# Patient Record
Sex: Male | Born: 1965 | Race: Black or African American | Hispanic: No | Smoking: Former smoker
Health system: Southern US, Community
[De-identification: ages and names within clinical notes are randomized; demographics above are authoritative.]

## PROBLEM LIST (undated history)

## (undated) ENCOUNTER — Emergency Department (HOSPITAL_COMMUNITY): Admission: EM | Payer: No Typology Code available for payment source | Source: Home / Self Care

## (undated) ENCOUNTER — Emergency Department: Payer: Self-pay

## (undated) DIAGNOSIS — W3400XA Accidental discharge from unspecified firearms or gun, initial encounter: Secondary | ICD-10-CM

## (undated) DIAGNOSIS — F319 Bipolar disorder, unspecified: Secondary | ICD-10-CM

## (undated) DIAGNOSIS — Z86718 Personal history of other venous thrombosis and embolism: Secondary | ICD-10-CM

## (undated) DIAGNOSIS — R45851 Suicidal ideations: Secondary | ICD-10-CM

## (undated) DIAGNOSIS — K5090 Crohn's disease, unspecified, without complications: Secondary | ICD-10-CM

## (undated) DIAGNOSIS — N289 Disorder of kidney and ureter, unspecified: Secondary | ICD-10-CM

## (undated) DIAGNOSIS — F329 Major depressive disorder, single episode, unspecified: Secondary | ICD-10-CM

## (undated) DIAGNOSIS — F32A Depression, unspecified: Secondary | ICD-10-CM

## (undated) DIAGNOSIS — R519 Headache, unspecified: Secondary | ICD-10-CM

## (undated) DIAGNOSIS — L739 Follicular disorder, unspecified: Secondary | ICD-10-CM

## (undated) DIAGNOSIS — I10 Essential (primary) hypertension: Secondary | ICD-10-CM

## (undated) DIAGNOSIS — E119 Type 2 diabetes mellitus without complications: Secondary | ICD-10-CM

## (undated) DIAGNOSIS — R51 Headache: Secondary | ICD-10-CM

## (undated) DIAGNOSIS — F419 Anxiety disorder, unspecified: Secondary | ICD-10-CM

## (undated) DIAGNOSIS — F4310 Post-traumatic stress disorder, unspecified: Secondary | ICD-10-CM

## (undated) DIAGNOSIS — S0193XA Puncture wound without foreign body of unspecified part of head, initial encounter: Secondary | ICD-10-CM

## (undated) HISTORY — PX: FOOT SURGERY: SHX648

## (undated) HISTORY — PX: KNEE SURGERY: SHX244

## (undated) HISTORY — PX: VASCULAR SURGERY: SHX849

## (undated) HISTORY — PX: FINGER SURGERY: SHX640

## (undated) HISTORY — DX: Crohn's disease, unspecified, without complications: K50.90

---

## 1999-01-07 ENCOUNTER — Emergency Department (HOSPITAL_COMMUNITY): Admission: EM | Admit: 1999-01-07 | Discharge: 1999-01-07 | Payer: Self-pay | Admitting: *Deleted

## 2004-02-22 ENCOUNTER — Emergency Department (HOSPITAL_COMMUNITY): Admission: EM | Admit: 2004-02-22 | Discharge: 2004-02-23 | Payer: Self-pay | Admitting: *Deleted

## 2005-06-11 ENCOUNTER — Emergency Department (HOSPITAL_COMMUNITY): Admission: EM | Admit: 2005-06-11 | Discharge: 2005-06-11 | Payer: Self-pay | Admitting: Emergency Medicine

## 2008-05-21 ENCOUNTER — Emergency Department (HOSPITAL_COMMUNITY): Admission: EM | Admit: 2008-05-21 | Discharge: 2008-05-21 | Payer: Self-pay | Admitting: Emergency Medicine

## 2008-07-09 ENCOUNTER — Ambulatory Visit: Payer: Self-pay | Admitting: Vascular Surgery

## 2008-07-09 ENCOUNTER — Encounter (INDEPENDENT_AMBULATORY_CARE_PROVIDER_SITE_OTHER): Payer: Self-pay | Admitting: Emergency Medicine

## 2008-07-09 ENCOUNTER — Inpatient Hospital Stay (HOSPITAL_COMMUNITY): Admission: EM | Admit: 2008-07-09 | Discharge: 2008-07-12 | Payer: Self-pay | Admitting: Emergency Medicine

## 2008-07-15 ENCOUNTER — Emergency Department (HOSPITAL_COMMUNITY): Admission: EM | Admit: 2008-07-15 | Discharge: 2008-07-15 | Payer: Self-pay | Admitting: Emergency Medicine

## 2008-07-19 ENCOUNTER — Emergency Department (HOSPITAL_COMMUNITY): Admission: EM | Admit: 2008-07-19 | Discharge: 2008-07-19 | Payer: Self-pay | Admitting: Emergency Medicine

## 2008-08-09 ENCOUNTER — Emergency Department (HOSPITAL_COMMUNITY): Admission: EM | Admit: 2008-08-09 | Discharge: 2008-08-09 | Payer: Self-pay | Admitting: Family Medicine

## 2008-08-09 ENCOUNTER — Emergency Department (HOSPITAL_COMMUNITY): Admission: EM | Admit: 2008-08-09 | Discharge: 2008-08-09 | Payer: Self-pay | Admitting: Emergency Medicine

## 2008-08-14 ENCOUNTER — Emergency Department (HOSPITAL_COMMUNITY): Admission: EM | Admit: 2008-08-14 | Discharge: 2008-08-14 | Payer: Self-pay | Admitting: Emergency Medicine

## 2008-08-14 ENCOUNTER — Ambulatory Visit: Payer: Self-pay | Admitting: Internal Medicine

## 2008-08-21 ENCOUNTER — Ambulatory Visit: Payer: Self-pay | Admitting: *Deleted

## 2008-08-21 ENCOUNTER — Ambulatory Visit: Payer: Self-pay | Admitting: Internal Medicine

## 2008-08-28 ENCOUNTER — Ambulatory Visit: Payer: Self-pay | Admitting: Internal Medicine

## 2008-09-11 ENCOUNTER — Ambulatory Visit: Payer: Self-pay | Admitting: Internal Medicine

## 2008-09-17 ENCOUNTER — Ambulatory Visit (HOSPITAL_COMMUNITY): Admission: RE | Admit: 2008-09-17 | Discharge: 2008-09-17 | Payer: Self-pay | Admitting: Internal Medicine

## 2008-10-08 ENCOUNTER — Ambulatory Visit: Payer: Self-pay | Admitting: Internal Medicine

## 2008-10-08 DIAGNOSIS — G609 Hereditary and idiopathic neuropathy, unspecified: Secondary | ICD-10-CM | POA: Insufficient documentation

## 2008-10-08 DIAGNOSIS — I2699 Other pulmonary embolism without acute cor pulmonale: Secondary | ICD-10-CM | POA: Insufficient documentation

## 2008-10-08 DIAGNOSIS — Z9189 Other specified personal risk factors, not elsewhere classified: Secondary | ICD-10-CM | POA: Insufficient documentation

## 2008-10-08 DIAGNOSIS — F329 Major depressive disorder, single episode, unspecified: Secondary | ICD-10-CM

## 2008-10-08 DIAGNOSIS — K219 Gastro-esophageal reflux disease without esophagitis: Secondary | ICD-10-CM

## 2008-10-08 DIAGNOSIS — F411 Generalized anxiety disorder: Secondary | ICD-10-CM | POA: Insufficient documentation

## 2008-10-08 DIAGNOSIS — K279 Peptic ulcer, site unspecified, unspecified as acute or chronic, without hemorrhage or perforation: Secondary | ICD-10-CM | POA: Insufficient documentation

## 2008-10-08 DIAGNOSIS — L509 Urticaria, unspecified: Secondary | ICD-10-CM

## 2008-10-08 DIAGNOSIS — I82409 Acute embolism and thrombosis of unspecified deep veins of unspecified lower extremity: Secondary | ICD-10-CM | POA: Insufficient documentation

## 2008-10-08 LAB — CONVERTED CEMR LAB
INR: 1.3 — ABNORMAL HIGH (ref 0.8–1.0)
Prothrombin Time: 14.2 s — ABNORMAL HIGH (ref 10.9–13.3)

## 2008-10-11 ENCOUNTER — Ambulatory Visit: Payer: Self-pay | Admitting: *Deleted

## 2008-11-11 ENCOUNTER — Emergency Department (HOSPITAL_COMMUNITY): Admission: EM | Admit: 2008-11-11 | Discharge: 2008-11-11 | Payer: Self-pay | Admitting: Emergency Medicine

## 2008-11-11 ENCOUNTER — Encounter (INDEPENDENT_AMBULATORY_CARE_PROVIDER_SITE_OTHER): Payer: Self-pay | Admitting: Emergency Medicine

## 2009-01-06 ENCOUNTER — Ambulatory Visit: Payer: Self-pay | Admitting: Internal Medicine

## 2009-01-31 ENCOUNTER — Emergency Department (HOSPITAL_COMMUNITY): Admission: EM | Admit: 2009-01-31 | Discharge: 2009-01-31 | Payer: Self-pay | Admitting: Emergency Medicine

## 2009-02-06 ENCOUNTER — Encounter (INDEPENDENT_AMBULATORY_CARE_PROVIDER_SITE_OTHER): Payer: Self-pay | Admitting: *Deleted

## 2009-02-06 ENCOUNTER — Inpatient Hospital Stay (HOSPITAL_COMMUNITY): Admission: EM | Admit: 2009-02-06 | Discharge: 2009-02-07 | Payer: Self-pay | Admitting: Emergency Medicine

## 2009-02-07 ENCOUNTER — Encounter: Payer: Self-pay | Admitting: Internal Medicine

## 2009-02-14 ENCOUNTER — Telehealth (INDEPENDENT_AMBULATORY_CARE_PROVIDER_SITE_OTHER): Payer: Self-pay | Admitting: *Deleted

## 2009-02-14 ENCOUNTER — Ambulatory Visit: Payer: Self-pay | Admitting: Internal Medicine

## 2009-02-14 DIAGNOSIS — N508 Other specified disorders of male genital organs: Secondary | ICD-10-CM | POA: Insufficient documentation

## 2009-02-14 DIAGNOSIS — K6289 Other specified diseases of anus and rectum: Secondary | ICD-10-CM

## 2009-02-14 LAB — CONVERTED CEMR LAB
INR: 1.8 — ABNORMAL HIGH (ref 0.8–1.0)
Prothrombin Time: 19.1 s — ABNORMAL HIGH (ref 9.1–11.7)

## 2009-02-19 ENCOUNTER — Ambulatory Visit: Payer: Self-pay | Admitting: Cardiology

## 2009-02-19 ENCOUNTER — Telehealth: Payer: Self-pay | Admitting: Internal Medicine

## 2009-02-19 LAB — CONVERTED CEMR LAB: POC INR: 1.7

## 2009-02-21 ENCOUNTER — Telehealth: Payer: Self-pay | Admitting: Internal Medicine

## 2009-02-21 ENCOUNTER — Encounter (INDEPENDENT_AMBULATORY_CARE_PROVIDER_SITE_OTHER): Payer: Self-pay | Admitting: *Deleted

## 2009-02-22 ENCOUNTER — Encounter: Payer: Self-pay | Admitting: Internal Medicine

## 2009-03-18 ENCOUNTER — Ambulatory Visit: Payer: Self-pay | Admitting: Gastroenterology

## 2009-03-19 ENCOUNTER — Encounter (INDEPENDENT_AMBULATORY_CARE_PROVIDER_SITE_OTHER): Payer: Self-pay | Admitting: Cardiology

## 2009-03-19 ENCOUNTER — Ambulatory Visit: Payer: Self-pay | Admitting: Cardiology

## 2009-03-19 LAB — CONVERTED CEMR LAB: POC INR: 2.1

## 2009-05-28 ENCOUNTER — Encounter (INDEPENDENT_AMBULATORY_CARE_PROVIDER_SITE_OTHER): Payer: Self-pay | Admitting: Cardiology

## 2009-07-29 ENCOUNTER — Encounter (INDEPENDENT_AMBULATORY_CARE_PROVIDER_SITE_OTHER): Payer: Self-pay | Admitting: Pharmacist

## 2009-08-21 ENCOUNTER — Encounter (INDEPENDENT_AMBULATORY_CARE_PROVIDER_SITE_OTHER): Payer: Self-pay | Admitting: Pharmacist

## 2009-09-24 ENCOUNTER — Telehealth (INDEPENDENT_AMBULATORY_CARE_PROVIDER_SITE_OTHER): Payer: Self-pay | Admitting: *Deleted

## 2009-09-25 ENCOUNTER — Telehealth: Payer: Self-pay | Admitting: Internal Medicine

## 2009-11-11 ENCOUNTER — Telehealth: Payer: Self-pay | Admitting: Internal Medicine

## 2009-11-12 ENCOUNTER — Encounter: Payer: Self-pay | Admitting: Internal Medicine

## 2009-11-20 ENCOUNTER — Encounter: Payer: Self-pay | Admitting: Cardiovascular Disease

## 2010-05-07 NOTE — Progress Notes (Signed)
  Phone Note Outgoing Call   Call placed by: Bethena Midget, RN, BSN,  September 24, 2009 4:45 PM Details for Reason: Missed CVRR appt.  Summary of Call: [facility] for pt to call and inform us of his coumadin status, where he is still on med or not. Flag also sent to his PCP.  Initial call taken by: Bethena Midget, RN, BSN,  September 24, 2009 4:46 PM

## 2010-05-07 NOTE — Progress Notes (Signed)
Summary: ?pt lost to followup  ---- Converted from flag ---- ---- 09/24/2009 4:45 PM, Bethena Midget, RN, BSN wrote: Lorain Childes: Hello, This is a very deliquent patient. We have not seen him since Dec 2010. We have attempted several times via phone calls and letters. Please advise. Thank you. ------------------------------  i have also not seen him since 02/2009 - i have not heard anything about his status - i will ask our office staff to also try contacting him to see if he is recieving care elsewhere. if not, will request f/u here  - thanks  Newt Lukes MD  September 25, 2009 12:17 PM        Additional Follow-up for Phone Call Additional follow up Details #2::    Called pt no ansew [facility] RTC ASAP Follow-up by: Orlan Leavens,  September 25, 2009 2:11 PM  Additional Follow-up for Phone Call Additional follow up Details #3:: Details for Additional Follow-up Action Taken: Attempt to call pt again not sucessful. No other contact person is his chart. Did leave another msg for him to RTC. Additional Follow-up by: Orlan Leavens,  September 26, 2009 1:23 PM  noted - thanks Newt Lukes MD  September 26, 2009 1:43 PM

## 2010-05-07 NOTE — Progress Notes (Signed)
Summary: pt lost to f/u - in jail - dismissal  Phone Note Outgoing Call   Call placed by: Bethena Midget, RN, BSN,  November 11, 2009 3:51 PM Call placed to: patient father Details for Reason: Past due CVRR appt Summary of Call: Spoke with pt father. He states that the pt has been incarcerated. Father not sure of length of time but at least a year. Unsure if pt is still on coumadin, unable to contact pt. Please advise how you wish to handle.  Initial call taken by: Bethena Midget, RN, BSN,  November 11, 2009 3:51 PM  Follow-up for Phone Call        as we are unable to safely provide care due to lack of reliable followup, and since pt is under care of alternate health service providers, i will generate a practice dismissal letter - i do not feel any other action is needed at this time -  lucy - can you please help me with these forms in lou's abscence? thanks Follow-up by: Newt Lukes MD,  November 11, 2009 5:03 PM  Additional Follow-up for Phone Call Additional follow up Details #1::        fill-out dismissal form, but also md will have to do discharge letter to send down with form Additional Follow-up by: Orlan Leavens RMA,  November 12, 2009 8:32 AM    Additional Follow-up for Phone Call Additional follow up Details #2::    form and letter done - given to lucy to give to [facility] upon return Newt Lukes MD  November 12, 2009 1:14 PM   Additional Follow-up for Phone Call Additional follow up Details #3:: Details for Additional Follow-up Action Taken: Gave letter to [facility] for dismissal Additional Follow-up by: Orlan Leavens RMA,  November 17, 2009 9:07 AM

## 2010-05-07 NOTE — Letter (Signed)
Summary: Custom - Delinquent Coumadin 1  Coumadin  [address]   [place], [place] [address]   Phone: [phone]  Fax: [phone]     Aug 21, 2009 MRN: [id number]   [facility] [address] [place], [place]  [address]   Dear Dale [name],  This letter is being sent to you as a reminder that it is necessary for you to get your INR/PT checked regularly so that we can optimize your care.  Our records indicate that you were scheduled to have a test done recently.  As of today, we have not received the results of this test.  It is very important that you have your INR checked.  Please call our office at the number listed above to schedule an appointment at your earliest convenience.    If you have recently had your protime checked or have discontinued this medication, please contact our office at the above phone number to clarify this issue.  Thank you for this prompt attention to this important health care matter.  Sincerely,   [facility] Team

## 2010-05-07 NOTE — Letter (Signed)
Summary: Custom - Delinquent Coumadin 1  Coumadin  [address]   [place], [place] [address]   Phone: [phone]  Fax: [phone]     May 28, 2009 MRN: [id number]   [facility] [address] [place], [place]  [address]   Dear Dale [name],  This letter is being sent to you as a reminder that it is necessary for you to get your INR/PT checked regularly so that we can optimize your care.  Our records indicate that you were scheduled to have a test done recently.  As of today, we have not received the results of this test.  It is very important that you have your INR checked.  Please call our office at the number listed above to schedule an appointment at your earliest convenience.    If you have recently had your protime checked or have discontinued this medication, please contact our office at the above phone number to clarify this issue.  Thank you for this prompt attention to this important health care matter.  Sincerely,   [facility] Team

## 2010-05-07 NOTE — Medication Information (Signed)
Summary: Coumadin Clinic  Anticoagulant Therapy  Managed by: Inactive Referring MD: Felicity Coyer PCP: Newt Lukes MD Supervising MD: Excell Seltzer MD, Casimiro Needle Indication 1: Pulmonary Embolism Lab Used: [facility] [place] Site: [address] INR RANGE 2-3          Comments: Pt dismissed from pratice.. incarcerated  Allergies: 1)  ! Ibuprofen  Anticoagulation Management History:      Negative risk factors for bleeding include an age less than 56 years old.  The bleeding index is 'low risk'.  Negative CHADS2 values include Age > 42 years old.  His last INR was 1.8 ratio.  Anticoagulation responsible provider: Excell Seltzer MD, Casimiro Needle.  Exp: 05/2010.    Anticoagulation Management Assessment/Plan:      The patient's current anticoagulation dose is Warfarin sodium 4 mg tabs: take as directed.  The next INR is due 04/09/2009.  Anticoagulation instructions were given to patient and wife.  Results were reviewed/authorized by Inactive.         Prior Anticoagulation Instructions: INR 2.1  Continue taking 2 tabs daily.  Recheck in 2 - 3 weeks.

## 2010-05-07 NOTE — Letter (Signed)
Summary: Custom - Delinquent Coumadin 2  Coumadin  [address]   [place], [place] [address]   Phone: [phone]  Fax: [phone]     July 29, 2009 MRN: [id number]   [facility] [address] [place], [place]  [address]   Dear Mr. Molesky,  We have attempted to contact you by phone and letter on multiple occasions to contact our office for important blood work associated with the blood thinner, warfarin (Coumadin).  Warfarin is a very important drug that can cause life threatening side effects including, bleeding, and thus requires close laboratory monitoring.  We are unable to accept responsibility for blood thinner-related health problems you may develop because you have not followed our recommendations for appropriate monitoring.  These may include abnormal bleeding occurrences and/or development of blood clots (stroke, heart attack, blood clots in legs or lungs, etc.).  We need for you to contact this office at the number listed above to schedule and complete this very important blood work.  Thank you for your assistance in this urgent matter.  Sincerely,  [facility] Team  Appended Document: Custom - Delinquent Coumadin 2 Attempted to call pt to reschedule missed CVRR appt.  LMOM to call back.  Delinquent letter sent.  EWJ

## 2010-05-07 NOTE — Letter (Signed)
Summary: Discharge Letter  [facility]  [address] [place], [place] [address]   Phone: [phone]  Fax: [phone]       11/12/2009 MRN: [id number]  [facility] [address] [place], [place]  [address]  Dear Dale [name],   I find it necessary to inform you that I will not be able to provide medical care to you, because of lack of reliable followup with our clinic and medical providers.   Since your condition requires medical attention, I advise you continue to receive care from your current health providers (ongoing at this time) and then place your self under the care of another physician without delay when you are released from ongoing care at your current facility. If you desire, I will be available for emergency care for 30 days after you receive this letter.  This should give you ample time to select a physician of your choice from the many competent providers in this area. You may want to call the local medical society or [name]'s physician referral service [phone]) for their assistance in locating a new physician. With your written authorization, I will make a copy of your medical record available to your new physician.   Sincerely,    Rene Paci MD  Appended Document: Discharge Letter Received signed receipt verifying delivery of certified letter.

## 2010-07-08 LAB — APTT: aPTT: 36 seconds (ref 24–37)

## 2010-07-08 LAB — COMPREHENSIVE METABOLIC PANEL
ALT: 27 U/L (ref 0–53)
ALT: 41 U/L (ref 0–53)
AST: 16 U/L (ref 0–37)
Alkaline Phosphatase: 84 U/L (ref 39–117)
BUN: 13 mg/dL (ref 6–23)
CO2: 25 mEq/L (ref 19–32)
Calcium: 8 mg/dL — ABNORMAL LOW (ref 8.4–10.5)
GFR calc Af Amer: >60 mL/min (ref >60)
GFR calc non Af Amer: >60 mL/min (ref >60)
Glucose, Bld: 88 mg/dL (ref 70–99)
Potassium: 3.2 mEq/L — ABNORMAL LOW (ref 3.5–5.1)
Sodium: 137 mEq/L (ref 135–145)
Sodium: 140 mEq/L (ref 135–145)
Total Bilirubin: 0.4 mg/dL (ref 0.3–1.2)
Total Protein: 5.2 g/dL — ABNORMAL LOW (ref 6.0–8.3)
Total Protein: 6.7 g/dL (ref 6.0–8.3)

## 2010-07-08 LAB — CBC
HCT: 39.6 % (ref 39.0–52.0)
HCT: 44.1 % (ref 39.0–52.0)
Hemoglobin: 13.5 g/dL (ref 13.0–17.0)
Hemoglobin: 14.9 g/dL (ref 13.0–17.0)
MCHC: 34 g/dL (ref 30.0–36.0)
MCHC: 34.2 g/dL (ref 30.0–36.0)
RBC: 4.35 MIL/uL (ref 4.22–5.81)
RBC: 4.39 MIL/uL (ref 4.22–5.81)
RBC: 4.88 MIL/uL (ref 4.22–5.81)
RDW: 12.6 % (ref 11.5–15.5)
RDW: 13.2 % (ref 11.5–15.5)

## 2010-07-08 LAB — URINALYSIS, ROUTINE W REFLEX MICROSCOPIC
Bilirubin Urine: NEGATIVE
Ketones, ur: NEGATIVE mg/dL
Nitrite: NEGATIVE
Protein, ur: NEGATIVE mg/dL
Urobilinogen, UA: 1 mg/dL (ref 0.0–1.0)

## 2010-07-08 LAB — DIFFERENTIAL
Basophils Absolute: 0 10*3/uL (ref 0.0–0.1)
Basophils Relative: 1 % (ref 0–1)
Eosinophils Absolute: 0.2 10*3/uL (ref 0.0–0.7)
Neutro Abs: 2.3 10*3/uL (ref 1.7–7.7)
Neutrophils Relative %: 53 % (ref 43–77)

## 2010-07-08 LAB — GLUCOSE, CAPILLARY
Glucose-Capillary: 72 mg/dL (ref 70–99)
Glucose-Capillary: 78 mg/dL (ref 70–99)
Glucose-Capillary: 91 mg/dL (ref 70–99)
Glucose-Capillary: 97 mg/dL (ref 70–99)

## 2010-07-08 LAB — URINE MICROSCOPIC-ADD ON

## 2010-07-08 LAB — CULTURE, BLOOD (ROUTINE X 2): Culture: NO GROWTH

## 2010-07-08 LAB — LIPASE, BLOOD: Lipase: 17 U/L (ref 11–59)

## 2010-07-08 LAB — PROTIME-INR
INR: 2.11 — ABNORMAL HIGH (ref 0.00–1.49)
Prothrombin Time: 23.5 seconds — ABNORMAL HIGH (ref 11.6–15.2)

## 2010-07-08 LAB — URINE CULTURE
Colony Count: NO GROWTH
Culture: NO GROWTH

## 2010-07-09 LAB — POCT I-STAT, CHEM 8
Hemoglobin: 15.3 g/dL (ref 13.0–17.0)
Sodium: 140 mEq/L (ref 135–145)
TCO2: 24 mmol/L (ref 0–100)

## 2010-07-09 LAB — PROTIME-INR
INR: 1.42 (ref 0.00–1.49)
Prothrombin Time: 17.2 seconds — ABNORMAL HIGH (ref 11.6–15.2)

## 2010-07-09 LAB — APTT: aPTT: 30 seconds (ref 24–37)

## 2010-07-11 LAB — POCT I-STAT, CHEM 8
BUN: 17 mg/dL (ref 6–23)
Calcium, Ion: 1.12 mmol/L (ref 1.12–1.32)
Calcium, Ion: 1.21 mmol/L (ref 1.12–1.32)
Chloride: 109 mEq/L (ref 96–112)
HCT: 46 % (ref 39.0–52.0)
HCT: 47 % (ref 39.0–52.0)
Potassium: 4 mEq/L (ref 3.5–5.1)
TCO2: 22 mmol/L (ref 0–100)

## 2010-07-11 LAB — DIFFERENTIAL
Basophils Relative: 1 % (ref 0–1)
Monocytes Relative: 8 % (ref 3–12)
Neutro Abs: 3.4 10*3/uL (ref 1.7–7.7)
Neutrophils Relative %: 59 % (ref 43–77)

## 2010-07-11 LAB — CBC
MCHC: 33.4 g/dL (ref 30.0–36.0)
RBC: 4.9 MIL/uL (ref 4.22–5.81)
WBC: 5.6 10*3/uL (ref 4.0–10.5)

## 2010-07-11 LAB — POCT CARDIAC MARKERS: Troponin i, poc: <0.05 ng/mL (ref 0.00–0.09)

## 2010-07-11 LAB — PROTIME-INR: INR: 1 (ref 0.00–1.49)

## 2010-07-13 LAB — APTT: aPTT: 34 seconds (ref 24–37)

## 2010-07-13 LAB — PROTIME-INR
INR: 2.1 — ABNORMAL HIGH (ref 0.00–1.49)
Prothrombin Time: 25 seconds — ABNORMAL HIGH (ref 11.6–15.2)

## 2010-07-13 LAB — BASIC METABOLIC PANEL
CO2: 24 mEq/L (ref 19–32)
Calcium: 9.1 mg/dL (ref 8.4–10.5)
Creatinine, Ser: 1.38 mg/dL (ref 0.4–1.5)
GFR calc Af Amer: >60 mL/min (ref >60)
Potassium: 3.4 mEq/L — ABNORMAL LOW (ref 3.5–5.1)
Sodium: 139 mEq/L (ref 135–145)

## 2010-07-13 LAB — CBC
Hemoglobin: 15.2 g/dL (ref 13.0–17.0)
RBC: 4.79 MIL/uL (ref 4.22–5.81)
WBC: 7.8 10*3/uL (ref 4.0–10.5)

## 2010-07-14 LAB — POCT CARDIAC MARKERS
Myoglobin, poc: 115 ng/mL (ref 12–200)
Troponin i, poc: <0.05 ng/mL (ref 0.00–0.09)

## 2010-07-14 LAB — CBC
HCT: 44.2 % (ref 39.0–52.0)
Hemoglobin: 15.1 g/dL (ref 13.0–17.0)
MCV: 92.9 fL (ref 78.0–100.0)
Platelets: 155 10*3/uL (ref 150–400)
RBC: 4.81 MIL/uL (ref 4.22–5.81)
RDW: 15.1 % (ref 11.5–15.5)
RDW: 15.2 % (ref 11.5–15.5)
WBC: 7.4 10*3/uL (ref 4.0–10.5)

## 2010-07-14 LAB — COMPREHENSIVE METABOLIC PANEL
ALT: 47 U/L (ref 0–53)
Alkaline Phosphatase: 81 U/L (ref 39–117)
Chloride: 109 mEq/L (ref 96–112)
Glucose, Bld: 116 mg/dL — ABNORMAL HIGH (ref 70–99)
Potassium: 4.2 mEq/L (ref 3.5–5.1)
Sodium: 141 mEq/L (ref 135–145)
Total Bilirubin: 0.5 mg/dL (ref 0.3–1.2)
Total Protein: 6.5 g/dL (ref 6.0–8.3)

## 2010-07-14 LAB — BASIC METABOLIC PANEL
BUN: 8 mg/dL (ref 6–23)
Chloride: 106 mEq/L (ref 96–112)
Glucose, Bld: 102 mg/dL — ABNORMAL HIGH (ref 70–99)
Potassium: 4.7 mEq/L (ref 3.5–5.1)

## 2010-07-14 LAB — DIFFERENTIAL
Basophils Absolute: 0 10*3/uL (ref 0.0–0.1)
Basophils Absolute: 0 10*3/uL (ref 0.0–0.1)
Basophils Relative: 0 % (ref 0–1)
Basophils Relative: 0 % (ref 0–1)
Eosinophils Absolute: 0.4 10*3/uL (ref 0.0–0.7)
Eosinophils Absolute: 0.4 10*3/uL (ref 0.0–0.7)
Eosinophils Relative: 6 % — ABNORMAL HIGH (ref 0–5)
Monocytes Absolute: 0.4 10*3/uL (ref 0.1–1.0)
Monocytes Relative: 5 % (ref 3–12)
Neutrophils Relative %: 67 % (ref 43–77)

## 2010-07-14 LAB — POCT URINALYSIS DIP (DEVICE)
Bilirubin Urine: NEGATIVE
Glucose, UA: NEGATIVE mg/dL
Ketones, ur: NEGATIVE mg/dL
Nitrite: NEGATIVE

## 2010-07-14 LAB — CK TOTAL AND CKMB (NOT AT ARMC): Total CK: 241 U/L — ABNORMAL HIGH (ref 7–232)

## 2010-07-14 LAB — PROTIME-INR
Prothrombin Time: 23.1 seconds — ABNORMAL HIGH (ref 11.6–15.2)
Prothrombin Time: 29.1 seconds — ABNORMAL HIGH (ref 11.6–15.2)

## 2010-07-14 LAB — TROPONIN I: Troponin I: 0.02 ng/mL (ref 0.00–0.06)

## 2010-07-15 LAB — PROTHROMBIN GENE MUTATION

## 2010-07-15 LAB — PROTIME-INR
INR: 1.1 (ref 0.00–1.49)
INR: 1 (ref 0.00–1.49)
INR: 1.1 (ref 0.00–1.49)
INR: 1.6 — ABNORMAL HIGH (ref 0.00–1.49)
Prothrombin Time: 14.6 seconds (ref 11.6–15.2)
Prothrombin Time: 13.1 seconds (ref 11.6–15.2)
Prothrombin Time: 13.7 seconds (ref 11.6–15.2)

## 2010-07-15 LAB — COMPREHENSIVE METABOLIC PANEL
AST: 80 U/L — ABNORMAL HIGH (ref 0–37)
CO2: 28 mEq/L (ref 19–32)
Calcium: 9.1 mg/dL (ref 8.4–10.5)
Creatinine, Ser: 1.09 mg/dL (ref 0.4–1.5)
GFR calc Af Amer: >60 mL/min (ref >60)
GFR calc non Af Amer: >60 mL/min (ref >60)
Total Protein: 6.4 g/dL (ref 6.0–8.3)

## 2010-07-15 LAB — CBC
HCT: 42.3 % (ref 39.0–52.0)
Hemoglobin: 14.5 g/dL (ref 13.0–17.0)
MCHC: 33.8 g/dL (ref 30.0–36.0)
MCHC: 34.1 g/dL (ref 30.0–36.0)
MCV: 91.8 fL (ref 78.0–100.0)
MCV: 91.9 fL (ref 78.0–100.0)
MCV: 92.9 fL (ref 78.0–100.0)
Platelets: 220 10*3/uL (ref 150–400)
Platelets: 222 10*3/uL (ref 150–400)
RBC: 4.84 MIL/uL (ref 4.22–5.81)
RDW: 13.3 % (ref 11.5–15.5)
WBC: 8.9 10*3/uL (ref 4.0–10.5)
WBC: 9.4 10*3/uL (ref 4.0–10.5)

## 2010-07-15 LAB — LUPUS ANTICOAGULANT PANEL: PTT Lupus Anticoagulant: 42.5 secs (ref 36.3–48.8)

## 2010-07-15 LAB — DIFFERENTIAL
Basophils Absolute: 0 10*3/uL (ref 0.0–0.1)
Basophils Relative: 0 % (ref 0–1)
Basophils Relative: 1 % (ref 0–1)
Eosinophils Absolute: 0.2 10*3/uL (ref 0.0–0.7)
Eosinophils Absolute: 0.3 10*3/uL (ref 0.0–0.7)
Eosinophils Relative: 4 % (ref 0–5)
Lymphocytes Relative: 22 % (ref 12–46)
Lymphs Abs: 1.4 10*3/uL (ref 0.7–4.0)
Monocytes Absolute: 0.5 10*3/uL (ref 0.1–1.0)
Monocytes Absolute: 0.6 10*3/uL (ref 0.1–1.0)
Monocytes Relative: 8 % (ref 3–12)
Neutro Abs: 6.5 10*3/uL (ref 1.7–7.7)
Neutrophils Relative %: 67 % (ref 43–77)
Neutrophils Relative %: 74 % (ref 43–77)

## 2010-07-15 LAB — URINE MICROSCOPIC-ADD ON

## 2010-07-15 LAB — URINALYSIS, ROUTINE W REFLEX MICROSCOPIC
Nitrite: NEGATIVE
Protein, ur: 30 mg/dL — AB
Specific Gravity, Urine: 1.025 (ref 1.005–1.030)
Urobilinogen, UA: 0.2 mg/dL (ref 0.0–1.0)

## 2010-07-15 LAB — LIPASE, BLOOD: Lipase: 16 U/L (ref 11–59)

## 2010-07-15 LAB — HOMOCYSTEINE: Homocysteine: 10.1 umol/L (ref 4.0–15.4)

## 2010-07-15 LAB — BETA-2-GLYCOPROTEIN I ABS, IGG/M/A: Beta-2-Glycoprotein I IgA: <4 U/mL (ref <10)

## 2010-07-15 LAB — POCT I-STAT, CHEM 8
Hemoglobin: 14.3 g/dL (ref 13.0–17.0)
Potassium: 4.1 mEq/L (ref 3.5–5.1)
Sodium: 144 mEq/L (ref 135–145)
TCO2: 23 mmol/L (ref 0–100)

## 2010-07-15 LAB — PROTEIN S ACTIVITY: Protein S Activity: 117 % (ref 69–129)

## 2010-07-15 LAB — GLUCOSE, CAPILLARY: Glucose-Capillary: 113 mg/dL — ABNORMAL HIGH (ref 70–99)

## 2010-07-15 LAB — PROTEIN C ACTIVITY: Protein C Activity: 132 % (ref 75–133)

## 2010-07-15 LAB — FACTOR 5 LEIDEN

## 2010-07-15 LAB — PROTEIN S, TOTAL: Protein S Ag, Total: 113 % (ref 70–140)

## 2010-08-18 NOTE — H&P (Signed)
Dale [name], Dale [name] NO.:  [id number]   MEDICAL RECORD NO.:  [id number]          PATIENT TYPE:  OBV   LOCATION:  5009                         FACILITY:  [facility]   PHYSICIAN:  Vania Rea, M.D. DATE OF BIRTH:  07-28-1965   DATE OF ADMISSION:  07/09/2008  DATE OF DISCHARGE:                              HISTORY & PHYSICAL   PRIMARY CARE PHYSICIAN:  Unassigned.   CHIEF COMPLAINT:  Painful left lower extremity swelling for 1 week.   HISTORY OF THE PRESENT ILLNESS:  This is a 45 year old African American  gentleman with no previous significant medical history related to the  lower extremities who presents the emergency room complaining of painful  left lower extremity swelling for the past 1 week.  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  He was incarcerated recently  and developed swelling of the left lower extremity while incarcerated.  The patient states his movements were not restricted and he was fairly  active in the [facility].  He was discharged 2 days later and has tried  to self-medicate himself with over-the-counter Tylenol unsuccessfully.  He eventually came to the emergency room where he had a Doppler done of  his lower extremity, which demonstrated DVT extending from the from the  tibial vein up into the proximal distal femoral veins.  The patient  denies any chest pains or shortness of breath.  The patient has no  history of recent long distance travel.  He has not been taking any  unusual medications.  He did have an aunt who died from a pulmonary  embolus related to DVT while she was hospitalized.  He has no history of  pelvic pain, but does suffer from episodic rectal bleeding, which he  relates to his hemorrhoids.   PAST MEDICAL HISTORY:  1. 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  2. History of GI bleed related to NSAID use in 1988; upper GI bleed.   MEDICATIONS:  1. Prilosec, OTC, 20 mg daily.  2. Multivitamins daily.  3. Tylenol, OTC, as needed.   ALLERGIES:  No known drug allergy history.   SOCIAL HISTORY:  The patient denies tobacco, alcohol or illicit drug  use.  He is married.  He has two children outside of his marriage.  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   FAMILY HISTORY:  The family history is significant for mother alive with  cancer of the vulva or vagina.  Father with a history of cancer of the  prostate and diabetes.  An aunt with a history of venous thromboembolism  as noted above.   REVIEW OF SYSTEMS:  The review of systems is significant for severe  insomnia; has difficulty falling asleep.  It takes him,  very often, 4  hours to fall asleep as he keeps having flashbacks to childhood traumas.  He tends to be very anxious and jumpy.  Other than this on a 10-point  review of systems the patient states yes to almost everything, which  seems to related, again, to his very anxious personality; however, none  of his yes answers appear significant.   PHYSICAL EXAMINATION:  GENERAL APPEARANCE:  The patient is a very  anxious young African American gentleman sitting up in bed.  VITAL SIGNS:  Temperature is 97.8, pulse is 73, respirations are 18,  blood pressure is 129/94, and he is saturating at 99% on room air.  He  describes his pain as 9/10.  HEENT:  Pupils are round and equal.  Mucous membranes pink and  anicteric.  He is not dehydrated.  NECK:  The neck reveals no cervical lymphadenopathy or thyromegaly.  No  jugular venous distention.  No carotid bruits.  CHEST:  The chest is clear to auscultation bilaterally.  HEART:  Cardiovascular system - regular rhythm.  No murmurs.  ABDOMEN:   The abdomen is soft and nontender.  EXTREMITIES:  The extremities show that the left is slightly swollen  compared to the right.  There is no edema, but the calf is very  warm  and tender.  He has 3+ bounding dorsalis pedis pulses bilaterally.  NEUROLOGIC EXAMINATION:  Central nervous system - cranial nerves II-XII  are grossly intact.  He has no focal neurological deficit.   LABORATORY DATA:  CBC is reviewed and is unremarkable; his hemoglobin is  13.9, MCV is 91.8 and platelets are 222,000.  His serum chemistry is  likewise reviewed and is completely normal.  His PT is 13.1.  His INR is  1.0.   ASSESSMENT:  1. Acute lower extremity deep venous thrombosis of unclear etiology.  2. Remote history of gastrointestinal bleed.  3. Episodic lower gastrointestinal bleed, suspect hemorrhoids.  4. Anxiety/PTSD.   PLAN:  1. We will draw a hypercoagulability panel in this young gentleman      with acute DVT with no clear precipitating cause.  2. We will go ahead and start full-dose anticoagulation with Lovenox      and Coumadin.  3. Arrange for him to be seen by case management to continue      outpatient treatment.  4. The patient has no symptoms or signs suggestive of carcinoma and at      this stage we will not go actively looking for cancer.  He only has      swelling of one leg rather than two.  5. We will counsel him to return to the emergency room or follow up      with his new primary care physician if he should develop increased      amounts of rectal bleeding.  6. I have discussed with the alternative of getting an IVC filter.  7. The patient is markedly anxious and suffers from insomnia.  I have      discussed with him the various treatment options.  We will start      him on low-dose benzodiazepines as well as daily Paxil.  The      patient does have an appointment with [facility] in the      coming week.      Vania Rea, M.D.  Electronically Signed     LC/MEDQ  D:  07/09/2008  T:  07/10/2008  Job:  [id number]

## 2010-08-18 NOTE — Discharge Summary (Signed)
Dale [name], Dale [name]                ACCOUNT NO.:  [id number]   MEDICAL RECORD NO.:  [id number]          PATIENT TYPE:  OBV   LOCATION:  5009                         FACILITY:  [facility]   PHYSICIAN:  Charlestine Massed, MDDATE OF BIRTH:  [birth year]   DATE OF ADMISSION:  07/09/2008  DATE OF DISCHARGE:  07/12/2008                               DISCHARGE SUMMARY   PRIMARY CARE PHYSICIAN:  Will be following with Dr. Reche Dixon at  [facility].   REASON FOR ADMISSION:  Painful left lower extremity for 1 week.   DISCHARGE DIAGNOSES:  1. Left lower extremity extensive deep venous thrombosis.  2. Bilateral pulmonary embolism with extensive right-sided pulmonary      embolism involving the right main pulmonary artery trunk.  3. Status post inferior vena cava filter.  4. Prior history of incarceration.  5. Prior history of gastrointestinal bleed in 1988 related to NSAID      use but currently not on NSAIDs as HE IS ALLERGIC TO IBUPROFEN.  6. Anxiety with insomnia.   DISCHARGE MEDICATIONS:  1. Paxil 10 mg p.o. daily.  2. Ambien 5 mg p.o. q.h.s.  3. Tylenol No. 3 two tablets p.o. every 6 hours p.r.n. for pain.  4. Lovenox 90 mg subcutaneously every 12 hours 7-day doses given to      continue Lovenox and stop Lovenox after 2 sets of INR are between 2      to 3.  Patient already has received 3 days of Coumadin dosing.  5. Coumadin 2 days dosing for July 12, 2008, is 12.5 mg.  INR check to      be done tomorrow at home by [facility] and this will      be relayed to [facility] for further Coumadin dosing.      Arrangements have been made by the case manager and social worker      on the floor.   EXISTING MEDICATIONS TO BE CONTINUED:  Prilosec 20 mg p.o. daily.   HOSPITAL COURSE BY PROBLEM:  1. Venous thromboembolism - left lower extremity DVT and bilateral      pulmonary embolism.  Patient was admitted on July 09, 2008, for      complaints of left lower extremity swelling which was  present for 1      week and which is getting increasingly painful.  He stated that he      has not been bedridden at any time.  [place] [facility] [place] [facility] and he said even though he was there he was      active at that time.  Patient was admitted with a partial diagnosis      of DVT to be ruled out.  2. Lower extremity Doppler revealed extensive DVT in the lower veins,      popliteal vein, as well as in the femoral vein.  After admission,      patient had some chest pain on the left side.  CAT scan of the      chest was done which revealed extensive pulmonary emboli on  the      right side, as well as the left side.  In the right side, it was      involving the main right pulmonary artery trunk.  The patient said      that his aunt died of pulmonary embolism so in view of the      extensive changes and the presence of extensive deep vein      thrombosis in his left lower extremity the concern for an      additional pulmonary emboli decompensating the patient was thought      about so patient was sent for IVC filter for the reason being that      an additional pulmonary embolism on top of this extensive emboli      can decompensate the patient and can even be fatal as he had emboli      present in the femoral vein.  Patient had IVC filter placed      yesterday.  Today, his site was checked which is clean.  He has a      retrievable IVC filter placed so after a period of 1 or 2 years as      per the discussion of the PMD the IVC filter can be removed as it      is a retrievable filter.  Patient has been arranged to have      [facility] for [facility]R check.  He is being discharged      with subcutaneous Lovenox dosage for the next 7 days.  He already      received Coumadin for the past 3 days so I expect the INR to go up      by tomorrow.  After 2 successful sets of INR between 2 to 3, the      Lovenox can be discontinued and to be continued on Coumadin.       Further decision of Coumadin dosing and stopping Lovenox to be done      by the [facility] MD with whom the case manager has set up the      [facility] MD to respond to call sent from the [facility]      who is trusted with the INR check for this patient so he is being      discharged home with the today dose of 12.5 mg Coumadin to be taken      tonight and the Lovenox.  3. Anxiety issues with insomnia.  He is being continued on Paxil 10 mg      p.o. daily and Ambien 5 mg p.o. q.h.s.  4. Pain.  He has extensive pain in the left lower extremities.  He is      discharged on Tylenol No. 3 two tablets p.o. every 6 hours p.r.n.      for the pain.  He has been advised to take Prilosec for his      existing issues.   INSTRUCTIONS:  1. Patient has been advised to adhere with Coumadin diet.  A Coumadin      diet booklet will be issued on the floor before he is discharged.  2. He will adhere to medications.  3. Observe strict fall precautions.  The side effects of Coumadin      having hematoma and bleeding in the brain in case of head injury      have been explained in detail with the patient.  He exhibited  understanding.  He has agreed to observe strict fall precautions.  4. Follow up with Dr. Reche Dixon at [facility] on Aug 14, 2008, and to      adhere to appointments.   FOLLOWUP:  1. Follow up with Dr. Reche Dixon on Aug 14, 2008.  2. [facility] to do INR check at home from tomorrow onwards      and adjust Coumadin dosing as per the instructions from the MD at      [facility].   A total of 45 minutes were spent on patient education, as well as on  discharging this patient.      Charlestine Massed, MD  Electronically Signed     UT/MEDQ  D:  07/12/2008  T:  07/12/2008  Job:  [id number]   cc:   Dineen Kid. Reche Dixon, M.D.

## 2010-08-18 NOTE — Discharge Summary (Signed)
NAMEGLENVILLE, [name]                ACCOUNT NO.:  [id number]   MEDICAL RECORD NO.:  [id number]          PATIENT TYPE:  OBV   LOCATION:  5009                         FACILITY:  [facility]   PHYSICIAN:  Charlestine Massed, MDDATE OF BIRTH:  [birth year]   DATE OF ADMISSION:  07/09/2008  DATE OF DISCHARGE:  07/12/2008                               DISCHARGE SUMMARY   ADDENDUM   The patient was discharged on July 12, 2008, after case manager set up  follow [facility] for INR check and the INR results to be  followed by [facility] and an appointment for Dr. Reche Dixon at a later  date was also set up.  The patient left the hospital.   After the patient left, I got a phone call from the case manager stating  that [facility] called and stating that there will not be anybody  available to take phone calls over the weekend.  Since the patient has  left already and the information came late, I agreed to follow the  PT/INR results and order Coumadin for Saturday and Sunday, that is the  10th and 11th of April 2010.  Yesterday, the [facility] called  in with an INR result of 3.7.  I ordered Coumadin of 2 mg for yesterday.  The dosage that was given on Friday was 12.5 mg by [name]  who dosed it.  Until yesterday, 2-mg dose was given and the patient also  had an INR check done today by [facility] who called in today  stating that the INR is 3.6.  I ordered 1.5-mg dose of Coumadin for  today.  The prescriptions were called into the [facility], phone  number [phone].  A prescription was called in for [name],  date of birth [birth year].  No refills were ordered.  The [facility] has been instructed to call [facility] from tomorrow onwards.  INR check for tomorrow has also been ordered to [facility] and  they will call in to [facility] for further Coumadin dosing.   I spoke to the patient today, he does not have any complaints.  He  is  feeling very fine.      Charlestine Massed, MD  Electronically Signed     UT/MEDQ  D:  07/14/2008  T:  07/15/2008  Job:  [id number]   cc:   Dineen Kid. Reche Dixon, M.D.

## 2012-10-09 ENCOUNTER — Emergency Department (HOSPITAL_COMMUNITY)
Admission: EM | Admit: 2012-10-09 | Discharge: 2012-10-09 | Disposition: A | Discharge status: Home / Self Care | Payer: Self-pay | Attending: Emergency Medicine | Admitting: Emergency Medicine

## 2012-10-09 ENCOUNTER — Encounter (HOSPITAL_COMMUNITY): Payer: Self-pay | Admitting: *Deleted

## 2012-10-09 DIAGNOSIS — Z79899 Other long term (current) drug therapy: Secondary | ICD-10-CM | POA: Insufficient documentation

## 2012-10-09 DIAGNOSIS — I10 Essential (primary) hypertension: Secondary | ICD-10-CM | POA: Insufficient documentation

## 2012-10-09 DIAGNOSIS — Z86718 Personal history of other venous thrombosis and embolism: Secondary | ICD-10-CM | POA: Insufficient documentation

## 2012-10-09 DIAGNOSIS — Z87891 Personal history of nicotine dependence: Secondary | ICD-10-CM | POA: Insufficient documentation

## 2012-10-09 DIAGNOSIS — Z7901 Long term (current) use of anticoagulants: Secondary | ICD-10-CM | POA: Insufficient documentation

## 2012-10-09 DIAGNOSIS — Z5181 Encounter for therapeutic drug level monitoring: Secondary | ICD-10-CM

## 2012-10-09 HISTORY — DX: Personal history of other venous thrombosis and embolism: Z86.718

## 2012-10-09 HISTORY — DX: Essential (primary) hypertension: I10

## 2012-10-09 LAB — CBC WITH DIFFERENTIAL/PLATELET
Basophils Absolute: 0 10*3/uL (ref 0.0–0.1)
HCT: 46.7 % (ref 39.0–52.0)
Lymphocytes Relative: 28 % (ref 12–46)
Monocytes Absolute: 0.3 10*3/uL (ref 0.1–1.0)
Neutro Abs: 2.2 10*3/uL (ref 1.7–7.7)
Platelets: 173 10*3/uL (ref 150–400)
RDW: 12.7 % (ref 11.5–15.5)
WBC: 3.8 10*3/uL — ABNORMAL LOW (ref 4.0–10.5)

## 2012-10-09 LAB — COMPREHENSIVE METABOLIC PANEL
ALT: 34 U/L (ref 0–53)
AST: 32 U/L (ref 0–37)
CO2: 26 mEq/L (ref 19–32)
Chloride: 105 mEq/L (ref 96–112)
GFR calc non Af Amer: 67 mL/min — ABNORMAL LOW (ref >90)
Potassium: 4.1 mEq/L (ref 3.5–5.1)
Sodium: 140 mEq/L (ref 135–145)
Total Bilirubin: 0.8 mg/dL (ref 0.3–1.2)

## 2012-10-09 LAB — PROTIME-INR: INR: 1.1 (ref 0.00–1.49)

## 2012-10-09 MED ORDER — WARFARIN SODIUM 5 MG PO TABS: 5.0000 mg | ORAL_TABLET | Freq: Every day | ORAL | Status: DC

## 2012-10-09 MED ORDER — WARFARIN SODIUM 2 MG PO TABS: 2.0000 mg | ORAL_TABLET | Freq: Every day | ORAL | Status: DC

## 2012-10-09 NOTE — ED Notes (Signed)
Charge nurse notified of K result. Instructed to move to Trauma C. Dr. Radford Pax ordered to check EKG and re-run blood work before moving.

## 2012-10-09 NOTE — ED Notes (Signed)
Pt states his provider cancelled his appointment today and he needs his INR checked because he only has one coumadin pill left.  Pt has history of massive blood clots in 2010.  No other symptoms

## 2012-10-09 NOTE — ED Provider Notes (Signed)
History    CSN: [id number] Arrival date & time 10/09/12  1024  First MD Initiated Contact with Patient 10/09/12 1051     Chief Complaint  Patient presents with  . INR check    (Consider location/radiation/quality/duration/timing/severity/associated sxs/prior Treatment) The history is provided by the patient. No language interpreter was used.  Dale [name] is a 47 y/o M with PMHx of HTN and blood clots presenting to ED for INR check. Patient reported that he was incarcerated and released from jail on September 11, 2012 - stated that he called his physician and was unable to get an appointment with physician until October 25, 2012 - patient stated that he needed to get his INR checked because he has not had that done since he was released and reported that he ran out of Coumadin yesterday, took his last pill and needed to get a refill so came to the ED. Patient reported that his last INR check was Aug 18, 2012 was 1.6. Patient reported that he could tell his INR was elevating due to feeling fatigued, dehydrated, and getting headaches - stated that this normally happens when his INR elevates. Stated that he was placed on Coumadin in 2010 dyue to massive blood clots in deep vein and pulmonary clots. Denied nausea, vomiting, diarrhea, fever, chills, chest pain, shortness of breath, difficulty breathing, urinary and gi symptoms. PCP Dr. Charlesetta Garibaldi  Past Medical History  Diagnosis Date  . H/O blood clots     massive  . Hypertension    Past Surgical History  Procedure Laterality Date  . Foot surgery    . Joint replacement      bilateral knee   No family history on file. History  Substance Use Topics  . Smoking status: Former Games developer  . Smokeless tobacco: Not on file  . Alcohol Use: No    Review of Systems  Constitutional: Negative for fever and chills.  HENT: Negative for sore throat, trouble swallowing, neck pain and neck stiffness.   Eyes: Negative for visual disturbance.  Respiratory:  Negative for chest tightness and shortness of breath.   Gastrointestinal: Negative for nausea, vomiting and abdominal pain.  Musculoskeletal: Negative for back pain.  Neurological: Negative for dizziness, weakness, light-headedness and numbness.  All other systems reviewed and are negative.    Allergies  Ibuprofen  Home Medications   Current Outpatient Rx  Name  Route  Sig  Dispense  Refill  . clotrimazole-betamethasone (LOTRISONE) cream   Topical   Apply 1 application topically 2 (two) times daily.         Marland Kitchen gabapentin (NEURONTIN) 600 MG tablet   Oral   Take 600 mg by mouth 3 (three) times daily.         . hydrochlorothiazide (HYDRODIURIL) 25 MG tablet   Oral   Take 25 mg by mouth daily.         . Multiple Vitamins-Minerals (MULTIVITAMIN WITH MINERALS) tablet   Oral   Take 1 tablet by mouth daily.         Marland Kitchen oxyCODONE-acetaminophen (PERCOCET) 10-325 MG per tablet   Oral   Take 1-2 tablets by mouth every 4 (four) hours as needed for pain.         . ranitidine (ZANTAC) 300 MG tablet   Oral   Take 300 mg by mouth at bedtime.         Marland Kitchen warfarin (COUMADIN) 3 MG tablet   Oral   Take 3 mg by mouth daily.         Marland Kitchen  warfarin (COUMADIN) 4 MG tablet   Oral   Take 4 mg by mouth daily.         Marland Kitchen warfarin (COUMADIN) 2 MG tablet   Oral   Take 1 tablet (2 mg total) by mouth daily.   10 tablet   0   . warfarin (COUMADIN) 5 MG tablet   Oral   Take 1 tablet (5 mg total) by mouth daily.   10 tablet   0    BP 112/84  Pulse 95  Temp(Src) 97.9 F (36.6 C) (Oral)  Resp 20  SpO2 96% Physical Exam  Nursing note and vitals reviewed. Constitutional: He is oriented to person, place, and time. He appears well-developed and well-nourished. No distress.  HENT:  Head: Normocephalic and atraumatic.  Mouth/Throat: Oropharynx is clear and moist. No oropharyngeal exudate.  Eyes: Conjunctivae and EOM are normal. Pupils are equal, round, and reactive to light. Right  eye exhibits no discharge. Left eye exhibits no discharge.  Neck: Normal range of motion. Neck supple.  Cardiovascular: Normal rate, regular rhythm and normal heart sounds.  Exam reveals no friction rub.   No murmur heard. Pulses:      Radial pulses are 2+ on the right side, and 2+ on the left side.  Pulmonary/Chest: Effort normal and breath sounds normal. No respiratory distress. He has no wheezes. He has no rales.  Musculoskeletal: Normal range of motion.  Strength 5+/5+ with resistance  Lymphadenopathy:    He has no cervical adenopathy.  Neurological: He is alert and oriented to person, place, and time. No cranial nerve deficit. He exhibits normal muscle tone. Coordination normal.  Skin: Skin is warm and dry. No rash noted. He is not diaphoretic. No erythema.  Psychiatric: He has a normal mood and affect. His behavior is normal. Thought content normal.    ED Course  Procedures (including critical care time)   Date: 10/09/2012  Rate: 74  Rhythm: normal sinus rhythm  QRS Axis: normal  Intervals: normal  ST/T Wave abnormalities: normal  Conduction Disutrbances:none  Narrative Interpretation:   Old EKG Reviewed: none available  Initially potassium was 9.0 when repeated potassium was 4.1. EKG negative for peaked T waves or changes to EKG wave forms. Suspicion of elevated Potassium due to lab error, hemolysis.   Patient had prescription bottles with him - Coumadin 5 mg PO daily and Coumadin 2 mg PO daily.  Labs Reviewed  CBC WITH DIFFERENTIAL - Abnormal; Notable for the following:    WBC 3.8 (*)    MCHC 36.2 (*)    All other components within normal limits  COMPREHENSIVE METABOLIC PANEL - Abnormal; Notable for the following:    Glucose, Bld 106 (*)    GFR calc non Af Amer 67 (*)    GFR calc Af Amer 78 (*)    All other components within normal limits  POCT I-STAT, CHEM 8 - Abnormal; Notable for the following:    Potassium 9.0 (*)    Glucose, Bld 105 (*)    Calcium, Ion 0.92  (*)    Hemoglobin 17.3 (*)    All other components within normal limits  APTT  PROTIME-INR   No results found. 1. Encounter for monitoring bridging anticoagulation therapy     MDM  Patient presenting to the ED with INR check since he recently came out of jail and was unable to get appointment with PCP and needed medication since he just ran out. First draw of potassium was elevated (9.0), when repeated was  4.1. EKG negative for peaked T waves or any abnormality noted - patient denied chest pain, shortness of breath, difficulty breathing. INR 1.10, Prothrombin time 14.0, aPTT 28 - all levels within normal limits. Patient stable, afebrile. Discharged patient. Coumadin medication given since patient ran out and does not have an appointment until October 25, 2012 with PCP. Discussed with patient to rest and stay hydrated. Referred [facility] to [facility] to get INR and aPTT levels re-checked. Discussed with patient the importance of monitoring levels to stay at a proper level. Discussed with patient to continue to monitor symptoms and if symptoms are to worsen or change to report back to the ED - strict return instructions given. Patient agreed to plan of care, understood, all questions answered.  Called patient at 6:42PM recommending patient to follow-up with [facility] next week to get levels re-checked of INR and aPTT. Patient agreed, understood, stated that he will call office tomorrow to set-up an appointment.       Raymon Mutton, PA-C 10/09/12 1843

## 2012-10-09 NOTE — ED Notes (Signed)
EKG given to Dr. Beaton 

## 2012-10-09 NOTE — ED Notes (Signed)
Called lab to run K stat.

## 2012-10-09 NOTE — ED Notes (Signed)
Dr Radford Pax sts to do ekg and then if needed repeat blood draw before we move him

## 2012-10-09 NOTE — ED Notes (Signed)
Pt here for check of INR. States his doctor canceled his appointment today. States was last check in May and INT was 1.6. Has been taking 7mg  Coumadin daily, Monday through Friday.

## 2012-10-10 LAB — POCT I-STAT, CHEM 8
BUN: 21 mg/dL (ref 6–23)
Chloride: 106 mEq/L (ref 96–112)
Sodium: 135 mEq/L (ref 135–145)

## 2012-10-10 NOTE — ED Provider Notes (Signed)
Medical screening examination/treatment/procedure(s) were performed by non-physician practitioner and as supervising physician I was immediately available for consultation/collaboration.  Woody Kronberg T Antanasia Kaczynski, MD 10/10/12 1510 

## 2012-10-16 ENCOUNTER — Ambulatory Visit: Payer: No Typology Code available for payment source | Attending: Family Medicine | Admitting: Internal Medicine

## 2012-10-16 ENCOUNTER — Ambulatory Visit: Payer: Self-pay

## 2012-10-16 VITALS — BP 124/84 | HR 75 | Temp 98.7°F | Resp 16 | Ht 71.0 in | Wt 193.0 lb

## 2012-10-16 DIAGNOSIS — Z800 Family history of malignant neoplasm of digestive organs: Secondary | ICD-10-CM | POA: Insufficient documentation

## 2012-10-16 DIAGNOSIS — Z87891 Personal history of nicotine dependence: Secondary | ICD-10-CM | POA: Insufficient documentation

## 2012-10-16 DIAGNOSIS — Z86718 Personal history of other venous thrombosis and embolism: Secondary | ICD-10-CM | POA: Insufficient documentation

## 2012-10-16 DIAGNOSIS — K648 Other hemorrhoids: Secondary | ICD-10-CM

## 2012-10-16 DIAGNOSIS — I10 Essential (primary) hypertension: Secondary | ICD-10-CM

## 2012-10-16 DIAGNOSIS — Z7901 Long term (current) use of anticoagulants: Secondary | ICD-10-CM | POA: Insufficient documentation

## 2012-10-16 DIAGNOSIS — Z8711 Personal history of peptic ulcer disease: Secondary | ICD-10-CM | POA: Insufficient documentation

## 2012-10-16 DIAGNOSIS — Z86711 Personal history of pulmonary embolism: Secondary | ICD-10-CM | POA: Insufficient documentation

## 2012-10-16 LAB — CBC
MCV: 86.8 fL (ref 78.0–100.0)
Platelets: 228 10*3/uL (ref 150–400)
RBC: 5.47 MIL/uL (ref 4.22–5.81)
WBC: 4 10*3/uL (ref 4.0–10.5)

## 2012-10-16 LAB — LIPID PANEL
Cholesterol: 169 mg/dL (ref 0–200)
Total CHOL/HDL Ratio: 3.8 Ratio

## 2012-10-16 MED ORDER — GABAPENTIN 600 MG PO TABS: 600.0000 mg | ORAL_TABLET | Freq: Three times a day (TID) | ORAL | Status: DC

## 2012-10-16 MED ORDER — MULTI-VITAMIN/MINERALS PO TABS: 1.0000 | ORAL_TABLET | Freq: Every day | ORAL | Status: DC

## 2012-10-16 MED ORDER — RANITIDINE HCL 300 MG PO TABS: 300.0000 mg | ORAL_TABLET | Freq: Every day | ORAL | Status: DC

## 2012-10-16 MED ORDER — CLOTRIMAZOLE-BETAMETHASONE 1-0.05 % EX CREA: 1.0000 "application " | TOPICAL_CREAM | Freq: Two times a day (BID) | CUTANEOUS | Status: DC

## 2012-10-16 MED ORDER — WARFARIN SODIUM 6 MG PO TABS: 6.0000 mg | ORAL_TABLET | Freq: Every day | ORAL | Status: DC

## 2012-10-16 MED ORDER — HYDROCHLOROTHIAZIDE 25 MG PO TABS: 25.0000 mg | ORAL_TABLET | Freq: Every day | ORAL | Status: DC

## 2012-10-16 NOTE — Progress Notes (Signed)
Patient presents to establish care and check INR for warfarin treatment.

## 2012-10-16 NOTE — Progress Notes (Signed)
Patient ID: Dale [name], male   DOB: 10/08/1965, 47 y.o.   MRN: [id number]  Patient Demographics  Dale [name], is a 47 y.o. male  CSN: [id number]  MRN: [id number]  DOB - 1965/06/24  Outpatient Primary MD for the patient is Standley Dakins, MD   With History of -  Past Medical History  Diagnosis Date  . H/O blood clots     massive  . Hypertension       Past Surgical History  Procedure Laterality Date  . Foot surgery    . Joint replacement      bilateral knee    in for   Chief Complaint  Patient presents with  . Establish Care  . Anticoagulation    INR     HPI  Dale [name]  is a 47 y.o. 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    Review of Systems    In addition to the HPI above,  No Fever-chills, No Headache, No changes with Vision or hearing, No problems swallowing food or Liquids, No Chest pain, Cough or Shortness of Breath, No Abdominal pain, No Nausea or Vommitting, Bowel movements are regular, No Blood in stool or Urine, No dysuria, No new skin rashes or bruises, No new joints pains-aches,  No new weakness, tingling, numbness in any extremity, No recent weight gain or loss, No polyuria, polydypsia or polyphagia, No significant Mental Stressors.  A full 10 point Review of Systems was done, except as stated above, all other Review of Systems were negative.   Social History History  Substance Use Topics  . Smoking status: Former Games developer  . Smokeless tobacco: Not on file  . Alcohol Use: No     Family History Cancer in his grandmother mid 71s  Prior to Admission medications   Medication Sig Start Date End Date Taking?  Authorizing Provider  clotrimazole-betamethasone (LOTRISONE) cream Apply 1 application topically 2 (two) times daily.    Historical Provider, MD  gabapentin (NEURONTIN) 600 MG tablet Take 1 tablet (600 mg total) by mouth 3 (three) times daily. 10/16/12   Leroy Sea, MD  hydrochlorothiazide (HYDRODIURIL) 25 MG tablet Take 1 tablet (25 mg total) by mouth daily. 10/16/12   Leroy Sea, MD  Multiple Vitamins-Minerals (MULTIVITAMIN WITH MINERALS) tablet Take 1 tablet by mouth daily. 10/16/12   Leroy Sea, MD  oxyCODONE-acetaminophen (PERCOCET) 10-325 MG per tablet Take 1-2 tablets by mouth every 4 (four) hours as needed for pain.    Historical Provider, MD  ranitidine (ZANTAC) 300 MG tablet Take 1 tablet (300 mg total) by mouth at bedtime. 10/16/12   Leroy Sea, MD  warfarin (COUMADIN) 2 MG tablet Take 1 tablet (2 mg total) by mouth daily. 10/09/12   Marissa Sciacca, PA-C  warfarin (COUMADIN) 3 MG tablet Take 3 mg by mouth daily.    Historical Provider, MD  warfarin (COUMADIN) 4 MG tablet Take 4 mg by mouth daily.    Historical Provider, MD  warfarin (COUMADIN) 5 MG tablet Take 1 tablet (5 mg total) by mouth daily. 10/09/12   Marissa Sciacca, PA-C    Allergies  Allergen Reactions  . Ibuprofen Hives    Physical  Exam  Vitals  Temperature 98.8, pulse 64, blood pressure 128/66, pulse ox 98% on room air  1. General middle-aged African American male sitting on clinic examination table in no apparent distress,    2. Normal affect and insight, Not Suicidal or Homicidal, Awake Alert, Oriented X 3.  3. No F.N deficits, ALL C.Nerves Intact, Strength 5/5 all 4 extremities, Sensation intact all 4 extremities, Plantars down going.  4. Ears and Eyes appear Normal, Conjunctivae clear, PERRLA. Moist Oral Mucosa.  5. Supple Neck, No JVD, No cervical lymphadenopathy appriciated, No Carotid Bruits.  6. Symmetrical Chest wall movement, Good air movement bilaterally, CTAB.  7. RRR, No  Gallops, Rubs or Murmurs, No Parasternal Heave.  8. Positive Bowel Sounds, Abdomen Soft, Non tender, No organomegaly appriciated,No rebound -guarding or rigidity.  9.  No Cyanosis, Normal Skin Turgor, No Skin Rash or Bruise.  10. Good muscle tone,  joints appear normal , no effusions, Normal ROM.  11. No Palpable Lymph Nodes in Neck or Axillae     Data Review  CBC No results found for this basename: WBC, HGB, HCT, PLT, MCV, MCH, MCHC, RDW, NEUTRABS, LYMPHSABS, MONOABS, EOSABS, BASOSABS, BANDABS, BANDSABD,  in the last 168 hours ------------------------------------------------------------------------------------------------------------------  Chemistries   Recent Labs Lab 10/09/12 1141  NA 140  K 4.1  CL 105  CO2 26  GLUCOSE 106*  BUN 14  CREATININE 1.26  CALCIUM 9.3  AST 32  ALT 34  ALKPHOS 61  BILITOT 0.8   ------------------------------------------------------------------------------------------------------------------ CrCl is unknown because both a height and weight (above a minimum accepted value) are required for this calculation. ------------------------------------------------------------------------------------------------------------------ No results found for this basename: TSH, T4TOTAL, FREET3, T3FREE, THYROIDAB,  in the last 72 hours   Coagulation profile No results found for this basename: INR, PROTIME,  in the last 168 hours ------------------------------------------------------------------------------------------------------------------- No results found for this basename: DDIMER,  in the last 72 hours -------------------------------------------------------------------------------------------------------------------  Cardiac Enzymes No results found for this basename: CK, CKMB, TROPONINI, MYOGLOBIN,  in the last 168 hours ------------------------------------------------------------------------------------------------------------------ No components  found with this basename: POCBNP,    ---------------------------------------------------------------------------------------------------------------  Urinalysis    Component Value Date/Time   COLORURINE YELLOW 02/05/2009 2350   APPEARANCEUR HAZY* 02/05/2009 2350   LABSPEC 1.029 02/05/2009 2350   PHURINE 6.0 02/05/2009 2350   GLUCOSEU NEGATIVE 02/05/2009 2350   HGBUR NEGATIVE 02/05/2009 2350   BILIRUBINUR NEGATIVE 02/05/2009 2350   KETONESUR NEGATIVE 02/05/2009 2350   PROTEINUR NEGATIVE 02/05/2009 2350   UROBILINOGEN 1.0 02/05/2009 2350   NITRITE NEGATIVE 02/05/2009 2350   LEUKOCYTESUR TRACE* 02/05/2009 2350       Assessment and plan  1. History of DVT and PE 2 years ago, patient has been on Coumadin since then, he takes Coumadin 7 mg Monday through Friday and none on Saturday Sunday, his INR was 1.5, I will place him on 6 mg of Coumadin but daily, he get him in 3 days for INR repeat, we'll also set him up with hematology to see his need and duration of anticoagulation.   2. Hypertension continue HCTZ check CMP.    3. History of peptic ulcer disease and internal hemorrhoids. Stable, patient does have family history of colon cancer in mid 52s and his grandma, will refer to GI for surveillance colonoscopy.    Routine health maintenance.  Screening labs. CBC, CMP, TSH, A1c, lipid panel ordered patient will come back in 3-4 days for followup.  Colonoscopy - referral made as family history of colon cancer in his grandmother mother in  her 39s     Immunizations tetanus shots next visit, clinic is out of tetanus shots        Leroy Sea M.D on 10/16/2012 at 11:36 AM

## 2012-10-17 LAB — HEMOGLOBIN A1C: Mean Plasma Glucose: 114 mg/dL (ref <117)

## 2012-10-17 LAB — COMPLETE METABOLIC PANEL WITH GFR
AST: 26 U/L (ref 0–37)
Alkaline Phosphatase: 60 U/L (ref 39–117)
BUN: 15 mg/dL (ref 6–23)
Calcium: 9.7 mg/dL (ref 8.4–10.5)
Creat: 1.33 mg/dL (ref 0.50–1.35)
GFR, Est Non African American: 64 mL/min

## 2012-10-17 LAB — TSH: TSH: 0.662 u[IU]/mL (ref 0.350–4.500)

## 2012-10-19 ENCOUNTER — Other Ambulatory Visit: Payer: Self-pay | Admitting: Family Medicine

## 2012-10-19 ENCOUNTER — Ambulatory Visit: Payer: Self-pay | Attending: Family Medicine

## 2012-10-19 VITALS — BP 134/82 | HR 78 | Temp 98.7°F | Resp 16

## 2012-10-19 DIAGNOSIS — J811 Chronic pulmonary edema: Secondary | ICD-10-CM

## 2012-10-19 LAB — POCT INR: INR: 1.6

## 2012-10-19 MED ORDER — WARFARIN SODIUM 7.5 MG PO TABS: 7.5000 mg | ORAL_TABLET | Freq: Every day | ORAL | Status: DC

## 2012-10-19 NOTE — Progress Notes (Unsigned)
PT HERE FOR INR DRAW. TAKING 6MG  COUMADIN PER DOCTORS ORDER. VSS

## 2012-10-19 NOTE — Progress Notes (Unsigned)
Patient instructed to increase dosage to 7.5 and return for INR in one week per Dr. Laural Benes.

## 2012-10-26 ENCOUNTER — Ambulatory Visit: Payer: Self-pay | Attending: Family Medicine

## 2012-10-26 DIAGNOSIS — Z7901 Long term (current) use of anticoagulants: Secondary | ICD-10-CM | POA: Insufficient documentation

## 2012-10-26 DIAGNOSIS — Z09 Encounter for follow-up examination after completed treatment for conditions other than malignant neoplasm: Secondary | ICD-10-CM | POA: Insufficient documentation

## 2012-10-26 DIAGNOSIS — I2699 Other pulmonary embolism without acute cor pulmonale: Secondary | ICD-10-CM

## 2012-10-26 NOTE — Patient Instructions (Addendum)
Patient instructed to continue dosage at 7.5 mg daily and return in one week for repeat of INR.

## 2012-10-26 NOTE — Progress Notes (Signed)
Patient presents for INR due to anticoagulation therapy.

## 2012-11-02 ENCOUNTER — Ambulatory Visit: Payer: Self-pay | Attending: Family Medicine

## 2012-11-02 DIAGNOSIS — I82409 Acute embolism and thrombosis of unspecified deep veins of unspecified lower extremity: Secondary | ICD-10-CM

## 2012-11-02 LAB — POCT INR: INR: 3.9

## 2012-11-02 NOTE — Patient Instructions (Signed)
Take 6 mg per day, and return in one week for repeat of INR.  If problems with combination of pills call clinic.

## 2012-11-02 NOTE — Progress Notes (Signed)
Patient presents for INR for long term anticoagulation treatment.  INR 3.9.  Patient instructed to take 6 mg per day and return for repeat of INR in one week.

## 2012-11-09 ENCOUNTER — Ambulatory Visit: Payer: Self-pay | Attending: Family Medicine

## 2012-11-09 ENCOUNTER — Other Ambulatory Visit: Payer: Self-pay | Admitting: Family Medicine

## 2012-11-09 DIAGNOSIS — I82409 Acute embolism and thrombosis of unspecified deep veins of unspecified lower extremity: Secondary | ICD-10-CM

## 2012-11-09 LAB — POCT INR: INR: 2.5

## 2012-11-09 MED ORDER — WARFARIN SODIUM 6 MG PO TABS: 6.0000 mg | ORAL_TABLET | Freq: Every day | ORAL | Status: DC

## 2012-11-09 NOTE — Progress Notes (Signed)
INR is 2.5.  Pt advised to continue warfarin 6 mg po daily and recheck INR in 1 week.  Rodney Langton, MD, CDE, FAAFP [facility] [place], [place]

## 2012-11-16 ENCOUNTER — Ambulatory Visit: Payer: Self-pay | Attending: Family Medicine

## 2012-11-16 DIAGNOSIS — I8290 Acute embolism and thrombosis of unspecified vein: Secondary | ICD-10-CM

## 2012-11-16 DIAGNOSIS — I749 Embolism and thrombosis of unspecified artery: Secondary | ICD-10-CM

## 2012-11-23 ENCOUNTER — Ambulatory Visit: Payer: Self-pay | Attending: Family Medicine

## 2012-11-23 DIAGNOSIS — I8290 Acute embolism and thrombosis of unspecified vein: Secondary | ICD-10-CM

## 2012-11-23 DIAGNOSIS — I749 Embolism and thrombosis of unspecified artery: Secondary | ICD-10-CM

## 2012-11-27 ENCOUNTER — Ambulatory Visit: Payer: Self-pay | Attending: Family Medicine

## 2012-11-27 DIAGNOSIS — I8290 Acute embolism and thrombosis of unspecified vein: Secondary | ICD-10-CM

## 2012-11-27 DIAGNOSIS — I749 Embolism and thrombosis of unspecified artery: Secondary | ICD-10-CM

## 2012-11-27 LAB — POCT INR: INR: 2.5

## 2012-11-29 ENCOUNTER — Other Ambulatory Visit: Payer: Self-pay

## 2012-12-05 ENCOUNTER — Ambulatory Visit: Payer: Self-pay | Attending: Internal Medicine

## 2012-12-05 DIAGNOSIS — I8290 Acute embolism and thrombosis of unspecified vein: Secondary | ICD-10-CM

## 2012-12-05 DIAGNOSIS — I749 Embolism and thrombosis of unspecified artery: Secondary | ICD-10-CM

## 2012-12-19 ENCOUNTER — Telehealth: Payer: Self-pay | Admitting: Emergency Medicine

## 2012-12-19 ENCOUNTER — Ambulatory Visit: Payer: Self-pay | Attending: Internal Medicine

## 2012-12-19 ENCOUNTER — Other Ambulatory Visit: Payer: Self-pay | Admitting: Internal Medicine

## 2012-12-19 VITALS — BP 125/79 | HR 87 | Temp 98.6°F | Resp 16

## 2012-12-19 DIAGNOSIS — I8290 Acute embolism and thrombosis of unspecified vein: Secondary | ICD-10-CM

## 2012-12-19 DIAGNOSIS — I749 Embolism and thrombosis of unspecified artery: Secondary | ICD-10-CM

## 2012-12-19 MED ORDER — WARFARIN SODIUM 6 MG PO TABS: 6.0000 mg | ORAL_TABLET | Freq: Every day | ORAL | Status: DC

## 2012-12-19 MED ORDER — WARFARIN SODIUM 7.5 MG PO TABS: 7.5000 mg | ORAL_TABLET | Freq: Every day | ORAL | Status: DC

## 2012-12-19 NOTE — Progress Notes (Unsigned)
Pt here for INR

## 2013-01-02 ENCOUNTER — Ambulatory Visit: Payer: No Typology Code available for payment source | Attending: Internal Medicine

## 2013-01-02 VITALS — BP 109/73 | HR 72 | Temp 98.7°F | Resp 18

## 2013-01-02 DIAGNOSIS — I82409 Acute embolism and thrombosis of unspecified deep veins of unspecified lower extremity: Secondary | ICD-10-CM

## 2013-01-02 NOTE — Patient Instructions (Signed)
INR 2.4  PT INSTRUCTED TO CONTINUE TAKING 44M- TUES,THURS,SAT,SUN 7.5 MG- M W F AND RETURN IN 2 WEEKS -PER DR. MYERS

## 2013-01-02 NOTE — Progress Notes (Unsigned)
PT HERE FOR REPEAT INR. ALTERATING WARFARIN 6MG -7.5 MG DENIES BLEEDING OR PAIN

## 2013-01-03 ENCOUNTER — Telehealth: Payer: Self-pay | Admitting: Emergency Medicine

## 2013-01-16 ENCOUNTER — Ambulatory Visit: Payer: No Typology Code available for payment source | Attending: Internal Medicine

## 2013-01-16 ENCOUNTER — Other Ambulatory Visit: Payer: Self-pay | Admitting: Internal Medicine

## 2013-01-16 DIAGNOSIS — I2699 Other pulmonary embolism without acute cor pulmonale: Secondary | ICD-10-CM

## 2013-01-16 LAB — POCT INR: INR: 2.2

## 2013-01-16 MED ORDER — WARFARIN SODIUM 7.5 MG PO TABS: 7.5000 mg | ORAL_TABLET | Freq: Every day | ORAL | Status: DC

## 2013-01-16 MED ORDER — GABAPENTIN 600 MG PO TABS: 600.0000 mg | ORAL_TABLET | Freq: Three times a day (TID) | ORAL | Status: DC

## 2013-01-16 MED ORDER — HYDROCHLOROTHIAZIDE 25 MG PO TABS: 25.0000 mg | ORAL_TABLET | Freq: Every day | ORAL | Status: DC

## 2013-01-16 MED ORDER — RANITIDINE HCL 300 MG PO TABS: 300.0000 mg | ORAL_TABLET | Freq: Every day | ORAL | Status: DC

## 2013-01-16 MED ORDER — WARFARIN SODIUM 6 MG PO TABS: 6.0000 mg | ORAL_TABLET | Freq: Every day | ORAL | Status: DC

## 2013-01-16 NOTE — Patient Instructions (Signed)
PT INSTRUCTED TO CONTINUE ALTERNATING 6/7.5 MG AND RETURN IN 3 WEEKS

## 2013-01-16 NOTE — Progress Notes (Unsigned)
PT HERE FOR REPEAT INR MEDICATION REFILL

## 2013-02-06 ENCOUNTER — Ambulatory Visit: Payer: No Typology Code available for payment source | Attending: Internal Medicine

## 2013-02-06 DIAGNOSIS — I82409 Acute embolism and thrombosis of unspecified deep veins of unspecified lower extremity: Secondary | ICD-10-CM

## 2013-02-06 LAB — POCT INR: INR: 2.5

## 2013-02-13 ENCOUNTER — Ambulatory Visit: Payer: No Typology Code available for payment source | Attending: Internal Medicine

## 2013-02-13 VITALS — BP 126/81 | HR 93 | Temp 97.8°F | Resp 16 | Ht 70.0 in | Wt 193.0 lb

## 2013-02-13 DIAGNOSIS — I749 Embolism and thrombosis of unspecified artery: Secondary | ICD-10-CM

## 2013-02-13 DIAGNOSIS — I8290 Acute embolism and thrombosis of unspecified vein: Secondary | ICD-10-CM

## 2013-02-13 DIAGNOSIS — Z09 Encounter for follow-up examination after completed treatment for conditions other than malignant neoplasm: Secondary | ICD-10-CM | POA: Insufficient documentation

## 2013-02-13 LAB — POCT INR: INR: 2.5

## 2013-02-13 NOTE — Patient Instructions (Signed)
Pt's INR was 2.5   Today and for this week make no changes to his medication and he is to return in a week to recheck his INR

## 2013-02-13 NOTE — Progress Notes (Signed)
Pt is here today for labs only. 

## 2013-02-20 ENCOUNTER — Ambulatory Visit: Payer: No Typology Code available for payment source | Attending: Internal Medicine

## 2013-02-20 DIAGNOSIS — I2699 Other pulmonary embolism without acute cor pulmonale: Secondary | ICD-10-CM

## 2013-02-20 NOTE — Patient Instructions (Signed)
Pt instructed to continue taking prescribed coumadin dose. No changes and to return in 3 weeks for repeat

## 2013-03-02 ENCOUNTER — Encounter (HOSPITAL_COMMUNITY): Payer: Self-pay | Admitting: Emergency Medicine

## 2013-03-02 ENCOUNTER — Inpatient Hospital Stay (HOSPITAL_COMMUNITY)
Admission: AD | Admit: 2013-03-02 | Discharge: 2013-03-14 | DRG: 885 | Disposition: A | Discharge status: Home / Self Care | Payer: Federal, State, Local not specified - Other | Source: Intra-hospital | Attending: Emergency Medicine | Admitting: Emergency Medicine

## 2013-03-02 ENCOUNTER — Emergency Department (HOSPITAL_COMMUNITY)
Admission: EM | Admit: 2013-03-02 | Discharge: 2013-03-02 | Disposition: A | Discharge status: Other Acute Inpatient Hospital | Payer: No Typology Code available for payment source | Attending: Emergency Medicine | Admitting: Emergency Medicine

## 2013-03-02 DIAGNOSIS — Z87891 Personal history of nicotine dependence: Secondary | ICD-10-CM | POA: Insufficient documentation

## 2013-03-02 DIAGNOSIS — Z5900 Homelessness unspecified: Secondary | ICD-10-CM

## 2013-03-02 DIAGNOSIS — Z87828 Personal history of other (healed) physical injury and trauma: Secondary | ICD-10-CM | POA: Insufficient documentation

## 2013-03-02 DIAGNOSIS — F411 Generalized anxiety disorder: Secondary | ICD-10-CM | POA: Insufficient documentation

## 2013-03-02 DIAGNOSIS — I10 Essential (primary) hypertension: Secondary | ICD-10-CM | POA: Insufficient documentation

## 2013-03-02 DIAGNOSIS — Z7901 Long term (current) use of anticoagulants: Secondary | ICD-10-CM | POA: Insufficient documentation

## 2013-03-02 DIAGNOSIS — F4310 Post-traumatic stress disorder, unspecified: Secondary | ICD-10-CM | POA: Diagnosis present

## 2013-03-02 DIAGNOSIS — F333 Major depressive disorder, recurrent, severe with psychotic symptoms: Principal | ICD-10-CM | POA: Diagnosis present

## 2013-03-02 DIAGNOSIS — L02419 Cutaneous abscess of limb, unspecified: Secondary | ICD-10-CM | POA: Diagnosis present

## 2013-03-02 DIAGNOSIS — L509 Urticaria, unspecified: Secondary | ICD-10-CM

## 2013-03-02 DIAGNOSIS — R45851 Suicidal ideations: Secondary | ICD-10-CM

## 2013-03-02 DIAGNOSIS — Z79899 Other long term (current) drug therapy: Secondary | ICD-10-CM | POA: Insufficient documentation

## 2013-03-02 DIAGNOSIS — F39 Unspecified mood [affective] disorder: Secondary | ICD-10-CM | POA: Diagnosis present

## 2013-03-02 DIAGNOSIS — K6289 Other specified diseases of anus and rectum: Secondary | ICD-10-CM

## 2013-03-02 DIAGNOSIS — I82409 Acute embolism and thrombosis of unspecified deep veins of unspecified lower extremity: Secondary | ICD-10-CM

## 2013-03-02 DIAGNOSIS — K219 Gastro-esophageal reflux disease without esophagitis: Secondary | ICD-10-CM

## 2013-03-02 DIAGNOSIS — F1410 Cocaine abuse, uncomplicated: Secondary | ICD-10-CM | POA: Diagnosis present

## 2013-03-02 DIAGNOSIS — K648 Other hemorrhoids: Secondary | ICD-10-CM

## 2013-03-02 DIAGNOSIS — Z86718 Personal history of other venous thrombosis and embolism: Secondary | ICD-10-CM

## 2013-03-02 DIAGNOSIS — Z609 Problem related to social environment, unspecified: Secondary | ICD-10-CM

## 2013-03-02 DIAGNOSIS — F3289 Other specified depressive episodes: Secondary | ICD-10-CM

## 2013-03-02 DIAGNOSIS — Z9189 Other specified personal risk factors, not elsewhere classified: Secondary | ICD-10-CM

## 2013-03-02 DIAGNOSIS — Z86711 Personal history of pulmonary embolism: Secondary | ICD-10-CM

## 2013-03-02 DIAGNOSIS — F329 Major depressive disorder, single episode, unspecified: Secondary | ICD-10-CM

## 2013-03-02 DIAGNOSIS — I2699 Other pulmonary embolism without acute cor pulmonale: Secondary | ICD-10-CM

## 2013-03-02 DIAGNOSIS — Z87820 Personal history of traumatic brain injury: Secondary | ICD-10-CM

## 2013-03-02 DIAGNOSIS — F602 Antisocial personality disorder: Secondary | ICD-10-CM | POA: Diagnosis present

## 2013-03-02 DIAGNOSIS — R45850 Homicidal ideations: Secondary | ICD-10-CM

## 2013-03-02 DIAGNOSIS — G609 Hereditary and idiopathic neuropathy, unspecified: Secondary | ICD-10-CM

## 2013-03-02 DIAGNOSIS — R443 Hallucinations, unspecified: Secondary | ICD-10-CM | POA: Insufficient documentation

## 2013-03-02 DIAGNOSIS — N508 Other specified disorders of male genital organs: Secondary | ICD-10-CM

## 2013-03-02 DIAGNOSIS — K279 Peptic ulcer, site unspecified, unspecified as acute or chronic, without hemorrhage or perforation: Secondary | ICD-10-CM

## 2013-03-02 HISTORY — DX: Accidental discharge from unspecified firearms or gun, initial encounter: W34.00XA

## 2013-03-02 HISTORY — DX: Puncture wound without foreign body of unspecified part of head, initial encounter: S01.93XA

## 2013-03-02 LAB — ETHANOL: Alcohol, Ethyl (B): <11 mg/dL (ref 0–11)

## 2013-03-02 LAB — COMPREHENSIVE METABOLIC PANEL
BUN: 12 mg/dL (ref 6–23)
CO2: 29 mEq/L (ref 19–32)
Calcium: 9.9 mg/dL (ref 8.4–10.5)
Chloride: 101 mEq/L (ref 96–112)
Creatinine, Ser: 1.51 mg/dL — ABNORMAL HIGH (ref 0.50–1.35)
GFR calc Af Amer: 62 mL/min — ABNORMAL LOW (ref >90)
GFR calc non Af Amer: 54 mL/min — ABNORMAL LOW (ref >90)
Total Bilirubin: 0.7 mg/dL (ref 0.3–1.2)

## 2013-03-02 LAB — CBC
HCT: 49.4 % (ref 39.0–52.0)
Hemoglobin: 17.3 g/dL — ABNORMAL HIGH (ref 13.0–17.0)
MCH: 31.2 pg (ref 26.0–34.0)
MCV: 89.2 fL (ref 78.0–100.0)
RBC: 5.54 MIL/uL (ref 4.22–5.81)
WBC: 8 10*3/uL (ref 4.0–10.5)

## 2013-03-02 LAB — RAPID URINE DRUG SCREEN, HOSP PERFORMED
Benzodiazepines: NOT DETECTED
Cocaine: POSITIVE — AB
Opiates: NOT DETECTED

## 2013-03-02 LAB — PROTIME-INR
INR: 2.24 — ABNORMAL HIGH (ref 0.00–1.49)
Prothrombin Time: 24.1 seconds — ABNORMAL HIGH (ref 11.6–15.2)

## 2013-03-02 MED ORDER — WARFARIN SODIUM 7.5 MG PO TABS
7.5000 mg | ORAL_TABLET | Freq: Every day | ORAL | Status: DC
  Filled 2013-03-02 (×2): qty 1

## 2013-03-02 MED ORDER — ACETAMINOPHEN 325 MG PO TABS
650.0000 mg | ORAL_TABLET | ORAL | Status: DC | PRN
  Administered 2013-03-04 – 2013-03-10 (×2): 650 mg via ORAL
  Filled 2013-03-02 (×2): qty 2

## 2013-03-02 MED ORDER — LORAZEPAM 1 MG PO TABS
1.0000 mg | ORAL_TABLET | Freq: Three times a day (TID) | ORAL | Status: DC | PRN
  Administered 2013-03-02 (×2): 1 mg via ORAL
  Filled 2013-03-02: qty 1

## 2013-03-02 MED ORDER — FAMOTIDINE 20 MG PO TABS: 40.0000 mg | ORAL_TABLET | Freq: Every day | ORAL | Status: DC

## 2013-03-02 MED ORDER — LORAZEPAM 1 MG PO TABS
ORAL_TABLET | ORAL | Status: AC
  Administered 2013-03-02: 1 mg via ORAL
  Filled 2013-03-02: qty 1

## 2013-03-02 MED ORDER — GABAPENTIN 300 MG PO CAPS
600.0000 mg | ORAL_CAPSULE | Freq: Three times a day (TID) | ORAL | Status: DC
  Administered 2013-03-02 – 2013-03-05 (×8): 600 mg via ORAL
  Filled 2013-03-02 (×13): qty 2

## 2013-03-02 MED ORDER — ONDANSETRON HCL 4 MG PO TABS: 4.0000 mg | ORAL_TABLET | Freq: Three times a day (TID) | ORAL | Status: DC | PRN

## 2013-03-02 MED ORDER — NICOTINE 21 MG/24HR TD PT24
21.0000 mg | MEDICATED_PATCH | Freq: Every day | TRANSDERMAL | Status: DC
  Filled 2013-03-02 (×15): qty 1

## 2013-03-02 MED ORDER — FAMOTIDINE 40 MG PO TABS
40.0000 mg | ORAL_TABLET | Freq: Every day | ORAL | Status: DC
  Administered 2013-03-02 – 2013-03-13 (×12): 40 mg via ORAL
  Filled 2013-03-02 (×4): qty 1
  Filled 2013-03-02: qty 2
  Filled 2013-03-02 (×9): qty 1
  Filled 2013-03-02: qty 2
  Filled 2013-03-02: qty 1

## 2013-03-02 MED ORDER — NICOTINE 21 MG/24HR TD PT24: 21.0000 mg | MEDICATED_PATCH | Freq: Every day | TRANSDERMAL | Status: DC

## 2013-03-02 MED ORDER — HYDROCORTISONE 1 % EX CREA
TOPICAL_CREAM | Freq: Four times a day (QID) | CUTANEOUS | Status: DC | PRN
  Administered 2013-03-02: 1 via TOPICAL
  Filled 2013-03-02: qty 28

## 2013-03-02 MED ORDER — ADULT MULTIVITAMIN W/MINERALS CH
1.0000 | ORAL_TABLET | Freq: Every day | ORAL | Status: DC
  Administered 2013-03-02: 1 via ORAL
  Filled 2013-03-02: qty 1

## 2013-03-02 MED ORDER — WARFARIN SODIUM 6 MG PO TABS: 6.0000 mg | ORAL_TABLET | Freq: Every day | ORAL | Status: DC

## 2013-03-02 MED ORDER — HYDROCHLOROTHIAZIDE 25 MG PO TABS
25.0000 mg | ORAL_TABLET | Freq: Every day | ORAL | Status: DC
  Administered 2013-03-04 – 2013-03-06 (×2): 25 mg via ORAL
  Filled 2013-03-02 (×15): qty 1

## 2013-03-02 MED ORDER — ALUM & MAG HYDROXIDE-SIMETH 200-200-20 MG/5ML PO SUSP: 30.0000 mL | ORAL | Status: DC | PRN

## 2013-03-02 MED ORDER — HYDROCHLOROTHIAZIDE 25 MG PO TABS
25.0000 mg | ORAL_TABLET | Freq: Every day | ORAL | Status: DC
  Filled 2013-03-02: qty 1

## 2013-03-02 MED ORDER — HYDROXYZINE HCL 50 MG PO TABS
50.0000 mg | ORAL_TABLET | Freq: Every evening | ORAL | Status: DC | PRN
  Administered 2013-03-02 – 2013-03-04 (×3): 50 mg via ORAL
  Filled 2013-03-02 (×3): qty 1

## 2013-03-02 MED ORDER — GABAPENTIN 300 MG PO CAPS
600.0000 mg | ORAL_CAPSULE | Freq: Three times a day (TID) | ORAL | Status: DC
  Administered 2013-03-02 (×2): 600 mg via ORAL
  Filled 2013-03-02 (×5): qty 2

## 2013-03-02 MED ORDER — WARFARIN - PHARMACIST DOSING INPATIENT
Freq: Every day | Status: DC
  Filled 2013-03-02 (×9): qty 1

## 2013-03-02 MED ORDER — TRAMADOL HCL 50 MG PO TABS
50.0000 mg | ORAL_TABLET | Freq: Once | ORAL | Status: AC
  Administered 2013-03-02: 50 mg via ORAL
  Filled 2013-03-02: qty 1

## 2013-03-02 MED ORDER — HYDROCORTISONE 1 % EX CREA
TOPICAL_CREAM | Freq: Four times a day (QID) | CUTANEOUS | Status: DC | PRN
  Administered 2013-03-02: 1 via TOPICAL
  Administered 2013-03-07 – 2013-03-11 (×3): via TOPICAL
  Filled 2013-03-02 (×2): qty 1.5

## 2013-03-02 MED ORDER — DIPHENHYDRAMINE HCL 25 MG PO CAPS
50.0000 mg | ORAL_CAPSULE | ORAL | Status: DC | PRN
  Administered 2013-03-03: 50 mg via ORAL
  Filled 2013-03-02: qty 2

## 2013-03-02 MED ORDER — ACETAMINOPHEN 325 MG PO TABS: 650.0000 mg | ORAL_TABLET | ORAL | Status: DC | PRN

## 2013-03-02 MED ORDER — WARFARIN - PHYSICIAN DOSING INPATIENT
Freq: Every day | Status: DC
  Filled 2013-03-02 (×9): qty 1

## 2013-03-02 MED ORDER — WARFARIN SODIUM 7.5 MG PO TABS
7.5000 mg | ORAL_TABLET | Freq: Every day | ORAL | Status: DC
  Filled 2013-03-02: qty 1

## 2013-03-02 MED ORDER — ADULT MULTIVITAMIN W/MINERALS CH
1.0000 | ORAL_TABLET | Freq: Every day | ORAL | Status: DC
  Administered 2013-03-03 – 2013-03-14 (×12): 1 via ORAL
  Filled 2013-03-02 (×15): qty 1

## 2013-03-02 MED ORDER — MAGNESIUM HYDROXIDE 400 MG/5ML PO SUSP: 30.0000 mL | Freq: Every day | ORAL | Status: DC | PRN

## 2013-03-02 MED ORDER — ZOLPIDEM TARTRATE 5 MG PO TABS: 5.0000 mg | ORAL_TABLET | Freq: Every evening | ORAL | Status: DC | PRN

## 2013-03-02 MED ORDER — DIPHENHYDRAMINE HCL 25 MG PO CAPS
25.0000 mg | ORAL_CAPSULE | Freq: Once | ORAL | Status: AC
  Administered 2013-03-02: 25 mg via ORAL
  Filled 2013-03-02: qty 1

## 2013-03-02 MED ORDER — DIPHENHYDRAMINE HCL 25 MG PO CAPS
50.0000 mg | ORAL_CAPSULE | ORAL | Status: DC | PRN
  Administered 2013-03-02: 50 mg via ORAL
  Filled 2013-03-02: qty 2

## 2013-03-02 NOTE — ED Notes (Signed)
Pt ate 100% of his lunch
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
## 2013-03-02 NOTE — ED Notes (Signed)
Money envelope given to security, lock box #1
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
## 2013-03-02 NOTE — ED Notes (Signed)
Brown shoes, boxer underwear, brown pants, black belt, brown long sleeve thermal shirt, blue golf type shirt. Money envelope given to security.   Cell phone, coumadin medication bottle, lighter, and carmex

## 2013-03-02 NOTE — ED Provider Notes (Signed)
Medical screening examination/treatment/procedure(s) were performed by non-physician practitioner and as supervising physician I was immediately available for consultation/collaboration.  EKG Interpretation   None         Junius Argyle, MD 03/02/13 [phone]

## 2013-03-02 NOTE — ED Notes (Signed)
Pt ate a ham sandwich 100%

## 2013-03-02 NOTE — Tx Team (Signed)
Initial Interdisciplinary Treatment Plan  PATIENT STRENGTHS: (choose at least two) Communication skills Supportive family/friends  PATIENT STRESSORS: Financial difficulties Medication change or noncompliance   PROBLEM LIST: Problem List/Patient Goals Date to be addressed Date deferred Reason deferred Estimated date of resolution  Anger 03/02/2013     Depression 03/02/2013     Suicide Risk 03/02/2013                                          DISCHARGE CRITERIA:  Improved stabilization in mood, thinking, and/or behavior Motivation to continue treatment in a less acute level of care Need for constant or close observation no longer present Verbal commitment to aftercare and medication compliance  PRELIMINARY DISCHARGE PLAN: Outpatient therapy Return to previous living arrangement  PATIENT/FAMIILY INVOLVEMENT: This treatment plan has been presented to and reviewed with the patient, Dale [name], and/or family member.  The patient and family have been given the opportunity to ask questions and make suggestions.  Dale [name] 03/02/2013, 11:57 PM
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
## 2013-03-02 NOTE — ED Notes (Signed)
Patient reports he took his own coumadin this AM.  1800 dose not needed.

## 2013-03-02 NOTE — ED Notes (Signed)
Black jacket

## 2013-03-02 NOTE — ED Notes (Signed)
Pt back in room from shower

## 2013-03-02 NOTE — ED Notes (Signed)
Pt has a black [employer] duffle bag, phone charger, black flip flop, grey bedroom shoes, two pair of boxer underwear, green thermal long sleeve shirt, blue tshirt, red button down dress shirt, black pants, blue pants, grey thermal long underwear, envelopes and papers in the bottom, toothpaste and tooth brush, one black water shoe, black pair of nike tennis shoes.
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
## 2013-03-02 NOTE — ED Notes (Signed)
MD at bedside. 

## 2013-03-02 NOTE — ED Provider Notes (Signed)
Pt with chronic pruritis arms and chest takes Benadryl and hydrocortisone, wants meds re-started, scattered flesh colored papules, no cellulitis or hives noted, L:CTA, d/w psychiatrist who will see Pt since Pt now awake. Pt is IVC for psychosis hallucinations with transient SI. 1300 Pt accepted at [facility] for transfer.  Hurman Horn, MD 03/02/13 2012
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
## 2013-03-02 NOTE — ED Notes (Signed)
Pt took his coumadin from his home medication bottle at 445am 03-02-2013

## 2013-03-02 NOTE — ED Provider Notes (Signed)
9:34 PM pt had left ED and was physically at [facility], I was notified by charge nurse that he had not had first opinion paperwork signed.  I referred to ED chart from when patient was initially seen at 4am earlier today as well as psychiatrist note written by Dr. Tawni Carnes who recommends admission for safety/stabilization and filled out paperwork.    Ethelda Chick, MD 03/02/13 [id number]

## 2013-03-02 NOTE — ED Notes (Signed)
Psychiatrist came by to visit patient.  Patient sleeping so soundly MD unable to evaluate.  Will return after patient awakens.

## 2013-03-02 NOTE — ED Notes (Signed)
Pt ate 100% of his breakfast.

## 2013-03-02 NOTE — ED Notes (Signed)
[facility] notified of need for transport to [facility]

## 2013-03-02 NOTE — ED Notes (Signed)
Pt alert and oriented in NAD,  Pt transferred to [facility] by [facility],  Belongings sent with pt and [facility]

## 2013-03-02 NOTE — ED Notes (Signed)
Patient c/o itchy rash.  MD notified.

## 2013-03-02 NOTE — ED Notes (Signed)
Patient refused itch cream until he has taken a shower.

## 2013-03-03 DIAGNOSIS — F333 Major depressive disorder, recurrent, severe with psychotic symptoms: Principal | ICD-10-CM

## 2013-03-03 DIAGNOSIS — F4310 Post-traumatic stress disorder, unspecified: Secondary | ICD-10-CM

## 2013-03-03 DIAGNOSIS — F1410 Cocaine abuse, uncomplicated: Secondary | ICD-10-CM

## 2013-03-03 MED ORDER — RISPERIDONE 1 MG PO TBDP
1.0000 mg | ORAL_TABLET | Freq: Every day | ORAL | Status: DC
  Administered 2013-03-03 – 2013-03-04 (×2): 1 mg via ORAL
  Filled 2013-03-03 (×4): qty 1

## 2013-03-03 MED ORDER — OLANZAPINE 5 MG PO TBDP
5.0000 mg | ORAL_TABLET | ORAL | Status: AC
  Administered 2013-03-03: 5 mg via ORAL

## 2013-03-03 MED ORDER — LORAZEPAM 1 MG PO TABS
2.0000 mg | ORAL_TABLET | Freq: Once | ORAL | Status: AC
  Administered 2013-03-03: 2 mg via ORAL
  Filled 2013-03-03: qty 2

## 2013-03-03 MED ORDER — WARFARIN - PHARMACIST DOSING INPATIENT
Freq: Every day | Status: DC
  Administered 2013-03-04 – 2013-03-08 (×2)
  Filled 2013-03-03 (×19): qty 1

## 2013-03-03 MED ORDER — WARFARIN SODIUM 6 MG PO TABS
6.0000 mg | ORAL_TABLET | Freq: Once | ORAL | Status: AC
  Administered 2013-03-03: 6 mg via ORAL
  Filled 2013-03-03: qty 1

## 2013-03-03 MED ORDER — SERTRALINE HCL 50 MG PO TABS
50.0000 mg | ORAL_TABLET | Freq: Every day | ORAL | Status: DC
  Administered 2013-03-04 – 2013-03-05 (×2): 50 mg via ORAL
  Filled 2013-03-03 (×3): qty 1

## 2013-03-03 MED ORDER — DIPHENHYDRAMINE HCL 25 MG PO CAPS
ORAL_CAPSULE | ORAL | Status: AC
  Filled 2013-03-03: qty 2

## 2013-03-03 MED ORDER — CARBAMAZEPINE 100 MG PO CHEW
200.0000 mg | CHEWABLE_TABLET | Freq: Two times a day (BID) | ORAL | Status: DC
  Administered 2013-03-03 – 2013-03-05 (×4): 200 mg via ORAL
  Filled 2013-03-03 (×7): qty 2

## 2013-03-03 MED ORDER — DIPHENHYDRAMINE HCL 50 MG PO CAPS
50.0000 mg | ORAL_CAPSULE | ORAL | Status: AC
  Administered 2013-03-03: 50 mg via ORAL

## 2013-03-03 MED ORDER — WARFARIN SODIUM 6 MG PO TABS
6.0000 mg | ORAL_TABLET | Freq: Every day | ORAL | Status: DC
  Filled 2013-03-03: qty 1

## 2013-03-03 MED ORDER — OLANZAPINE 5 MG PO TBDP
ORAL_TABLET | ORAL | Status: AC
  Filled 2013-03-03: qty 1
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
## 2013-03-03 NOTE — Progress Notes (Signed)
Patient ID: Dale [name], male   DOB: 01/16/1966, 47 y.o.   MRN: [id number] 1-1 monitoring note. D. Patient high fall risk. 1/1 ordered for patient safety. A. 1-1 staff remains at arms length for patient safety. R. Patient remains safe at this time. Will continue to monitor 1.1 for safety.  

## 2013-03-03 NOTE — Progress Notes (Signed)
Patient ID: Dale [name], male   DOB: 01/15/1966, 47 y.o.   MRN: [id number] 1-1 monitoring note. D. Patient high fall risk. 1/1 ordered for patient safety. A. 1-1 staff remains at arms length for patient safety. R. Patient remains safe at this time. Will continue to monitor 1.1 for safety.  

## 2013-03-03 NOTE — Progress Notes (Signed)
ANTICOAGULATION CONSULT NOTE - Follow Up Consult  Pharmacy Consult for Coumadin Indication: H/O Blood clots  Allergies  Allergen Reactions  . Ibuprofen Hives    Patient Measurements: Height: 5\' 10"  (177.8 cm) Weight: 194 lb 0.1 oz (88 kg) IBW/kg (Calculated) : 73   Vital Signs: Temp: 98.1 F (36.7 C) (11/28 2100) Temp src: Oral (11/28 2100) BP: 101/66 mmHg (11/28 2102) Pulse Rate: 87 (11/28 2102)  Labs:  Recent Labs  03/02/13 0412 03/03/13 0634  HGB 17.3*  --   HCT 49.4  --   PLT 252  --   LABPROT 24.1* 28.7*  INR 2.24* 2.82*  CREATININE 1.51*  --     Estimated Creatinine Clearance: 68.3 ml/min (by C-G formula based on Cr of 1.51).   Medications:  Scheduled:  . famotidine  40 mg Oral QHS  . gabapentin  600 mg Oral TID  . hydrochlorothiazide  25 mg Oral Daily  . multivitamin with minerals  1 tablet Oral Daily  . nicotine  21 mg Transdermal Daily  . warfarin  6 mg Oral q1800  . Warfarin - Physician Dosing Inpatient   Does not apply q1800    Assessment: INR at goal.  No problems with anticoagulation Goal of Therapy:  INR 2-3    Plan:  Coumadin 6 mg po x1 today at 1800.  PT/INR in am     Charyl Dancer 03/03/2013,7:40 AM

## 2013-03-03 NOTE — [facility] Group Notes (Signed)
[facility] Group Notes:  (Clinical Social Work)  03/03/2013  11:15-11:45AM  Summary of Progress/Problems:   The main focus of today's process group was for the patient to identify ways in which they have in the past sabotaged their own recovery and reasons they may have done this/what they received from doing it.  We then worked to identify a specific plan to avoid doing this when discharged from the hospital for this admission.  The patient expressed a willingness to be in group, but was frustrated about his cane being taken away from him.  He stated he wanted to ask the nurse about getting it back, stating he knows why it was taken away and he does not want to be in a wheelchair because he is used to his cane.  When he became sleepy and seemed to doze off, CSW did not awaken him due to his amount of irritability earlier.  Type of Therapy:  Group Therapy - Process  Participation Level:  Minimal  Participation Quality:  Drowsy  Affect:  Irritable  Cognitive:  Oriented  Insight:  Lacking  Engagement in Therapy:  Limited  Modes of Intervention:  Clarification, Education, Exploration, Discussion  Ambrose Mantle, LCSW 03/03/2013, 12:37 PM

## 2013-03-03 NOTE — Progress Notes (Signed)
Pt has been agitated and angry this evening. Pt has had multiple complaints. Asking for something stronger than tylenol for for his R sided leg and foot pain. States he takes percocet along with his neurontin. Asking if he can be given tramadol if the percocet isn't an option. Also upset that his trazadone, celexa and ambien have not been continued however admits he's been off of those meds since 2012. Feels provider did not tell him what would be prescribed and was upset when this writer brought him his 2000 tegretol (new order). Also upset about his cane being taken away and feels he should be allowed to have his clippers on the unit and refuses available razor. Allowed pt to vent and provided active listening and support. Did confer with NP regarding additional pain management however no new orders received. Explained to pt rationale for use of wheelchair and reminded pt of events from this morning as to why that decision was made. Med list given along with med education. Fall precautions reviewed. Pt acknowledges he is irritable and agitated. "I don't mean it towards you all." He denies SI/HI/AVH and verbalizes understanding of fall precautions. 1:1 obs remains in place for safety and pt is safe. Lawrence Marseilles

## 2013-03-03 NOTE — Progress Notes (Signed)
Patient ID: Dale [name], male   DOB: 08/01/1965, 47 y.o.   MRN: [id number] 1-1 monitoring note. D. Patient high fall risk. 1/1 ordered for patient safety. A. 1-1 staff remains at arms length for patient safety. R. Patient remains safe at this time. Will continue to monitor 1.1 for safety.  

## 2013-03-03 NOTE — Progress Notes (Signed)
[facility] Group Notes:  (Nursing/MHT/Case Management/Adjunct)  Date:  03/03/2013  Time:  8:46 PM  Type of Therapy:  Group Therapy  Participation Level:  Active  Participation Quality:  Appropriate  Affect:  Appropriate  Cognitive:  Appropriate  Insight:  Appropriate  Engagement in Group:  Engaged  Modes of Intervention:  Discussion  Summary of Progress/Problems:The patient expressed that he felt that his treatment was wrong.The patient was also some what upset that his cane was taken from him.He said that he felt helpless without his cane .  The patient also feels that he need  t.o be reassess by the doctor.  Dale [name] 03/03/2013, 8:46 PM

## 2013-03-03 NOTE — Progress Notes (Deleted)
Patient ID: Dale [name], male   DOB: [birth year], 47 y.o.   MRN: [id number] 1:1 Nursing Note: The patient remains in the quiet room with a staff member. Although she has been medicated, she remains loud, intrusive and hyper-verbal. Her speech is rapid and pressured. She is responding better to redirection from staff. 1:1 maintained for safety. Will continue to monitor.

## 2013-03-03 NOTE — Progress Notes (Signed)
Patient ID: Dale [name], male   DOB: 11/04/1965, 47 y.o.   MRN: [id number] 1-1 monitoring note. D. Patient high fall risk. 1/1 ordered for patient safety. A. 1-1 staff remains at arms length for patient safety. R. Patient remains safe at this time. Will continue to monitor 1.1 for safety.
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
## 2013-03-03 NOTE — Progress Notes (Signed)
Patient ID: Dale [name], male   DOB: October 10, 1965, 47 y.o.   MRN: [id number] Psychoeducational Group Note  Date:  03/03/2013 Time:0930am  Group Topic/Focus:  Identifying Needs:   The focus of this group is to help patients identify their personal needs that have been historically problematic and identify healthy behaviors to address their needs.  Participation Level:  Active  Participation Quality:  Appropriate  Affect:  Irritable and Labile  Cognitive:  Appropriate  Insight:  Supportive  Engagement in Group:  Supportive  Additional Comments:  Psychoeducational group-healthy coping skills   Valente David 03/03/2013,10:17 AM

## 2013-03-04 MED ORDER — TRAMADOL HCL 50 MG PO TABS
50.0000 mg | ORAL_TABLET | Freq: Two times a day (BID) | ORAL | Status: DC
  Administered 2013-03-04 – 2013-03-10 (×12): 50 mg via ORAL
  Filled 2013-03-04 (×12): qty 1

## 2013-03-04 MED ORDER — WARFARIN SODIUM 6 MG PO TABS
6.0000 mg | ORAL_TABLET | Freq: Every day | ORAL | Status: DC
  Administered 2013-03-04 – 2013-03-09 (×6): 6 mg via ORAL
  Filled 2013-03-04 (×9): qty 1

## 2013-03-04 MED ORDER — ENSURE COMPLETE PO LIQD
237.0000 mL | Freq: Two times a day (BID) | ORAL | Status: DC
  Administered 2013-03-04 – 2013-03-14 (×21): 237 mL via ORAL
  Filled 2013-03-04: qty 237

## 2013-03-04 NOTE — Progress Notes (Signed)
Nutrition Brief Note Patient identified on the Malnutrition Screening Tool (MST) Report.  Wt Readings from Last 10 Encounters:  03/02/13 194 lb 0.1 oz (88 kg)  03/02/13 195 lb (88.451 kg)  02/13/13 193 lb (87.544 kg)  10/16/12 193 lb (87.544 kg)  02/14/09 193 lb 1.9 oz (87.598 kg)  10/08/08 194 lb 3.2 oz (88.089 kg)   Body mass index is 27.84 kg/(m^2). Patient meets criteria for overweight based on current BMI.   Discussed intake PTA with patient and compared to intake presently.  Discussed changes in intake, if any, and encouraged adequate intake of meals and snacks. Current diet order is regular and pt is also offered choice of unit snacks mid-morning and mid-afternoon.  Pt is eating as desired.   Labs and medications reviewed.   Pt reported that he has had a 20-25 lb wt loss in the past several months due to "stress and loss of appetite." He says that his appetite is "returning slowly now that his medications are working." Pt said that he would benefit from Ensure Plus supplementation. Pt also had questions about coumadin and foods that interact with the medication. He was given verbal information on what foods to watch and limit.   Nutrition Dx:  Unintended wt change r/t suboptimal oral intake AEB pt report  Interventions:   Discussed the importance of nutrition and encouraged intake of food and beverages.     Discussed weight goals with patient.   Discussed coumadin and Vitamin K   Supplements: Ensure Complete po BID, each supplement provides 350 kcal and 13 grams of protein.    No additional nutrition interventions warranted at this time. If nutrition issues arise, please consult RD.   Ebbie Latus RD, LDN

## 2013-03-04 NOTE — Progress Notes (Signed)
Spoke with pt 1:1. His main concern continues to be chronic R lower leg pain. He remains somewhat agitated and irritable though is more agreeable with this Buyer, retail. Lots of time spent reviewing patient's meds with him. He shows confusion and has trouble remembering what is said which most likely is due to his TBI. New order received for tramadol which pt was most appreciative of. He has elected to remain in the wheelchair as the pain has been too great to use the walker which was newly ordered. Pt given support and reassurance. Medicated per orders and given vistaril for sleep per his request. Fall precautions reviewed and 1:1 obs in place for high fall risk and threatening behaviors thus far during his admit. He verbalizes understanding. He denies SI/HI/AVH and states, "I had black outs before coming in. Yes there were voices but it was more just not being aware and conscious of what I was doing. I'm not feeling that now." Will continue to monitor closely. Pt currently resting in bed. Lawrence Marseilles

## 2013-03-04 NOTE — Progress Notes (Signed)
Patient ID: Dale [name], male   DOB: [birth year], 47 y.o.   MRN: [id number] 03-04-13 @ 1800 nursing 1:1 note: D: pt was allowed to go to cafeteria with the mht. He remains on a 1:1 due to a fall risk. He complained of pain in his right leg. A: RN gave him his 2200 dose of  gabapentin early and two tylenol for the right leg pain. Also RN suggested he go to his room wait  for a 1-2 hrs before he takes his shower, to let the medicine take effect. Also RN suggest he elevate the right leg. R: pt refused to follow the instructions and returned to the dayroom. 1:1 continues.

## 2013-03-04 NOTE — Progress Notes (Addendum)
Patient ID: Dale [name], male   DOB: Apr 09, 1965, 47 y.o.   MRN: [id number] 03-04-13 @1400  nursing 1:1 note: d: pt ate all of his lunch and drank his nutritional supplement, as well as 480 cc of additional fluid. He remains hostile and has attempted to staff split. A: redirected this patient verbally and re-enforced milieu rules. RN still attempting to gain the trust of this patient. RN continues to reinforce fall precautions.  Will continue to encourage. R: he remains resistant to care. The 1:1 continues.

## 2013-03-04 NOTE — Progress Notes (Signed)
Adult Psychoeducational Group Note  Date:  03/04/2013 Time:  9:39 PM  Group Topic/Focus:  Wrap-Up Group:   The focus of this group is to help patients review their daily goal of treatment and discuss progress on daily workbooks.  Participation Level:  Active  Participation Quality:  Appropriate and Sharing  Affect:  Appropriate  Cognitive:  Appropriate  Insight: Good  Engagement in Group:  Engaged  Modes of Intervention:  Discussion  Additional Comments:  Pt stated that his goal was to stay focus and positive and continue to be better man, Pt also stated that he has a good support system that includes parents and girlfriend.  Pt stated that he knew that he needed help and that he glad now that he's getting the help that he needs  Louanne Belton 03/04/2013, 9:39 PM

## 2013-03-04 NOTE — [facility] Group Notes (Signed)
[facility] Group Notes:  (Clinical Social Work)  03/04/2013   11:15am-12:00pm  Summary of Progress/Problems:  The main focus of today's process group was to listen to a variety of genres of music and to identify that different types of music provoke different responses.  The patient then was able to identify personally what was soothing for them, as well as energizing.  Handouts were used to record feelings evoked, as well as how patient can personally use this knowledge in sleep habits, with depression, and with other symptoms.  The patient expressed understanding of concepts, as well as knowledge of how each type of music affected them and how this can be used when they are at home as a tool in their recovery.  He was able to identify his feelings.  Type of Therapy:  Music Therapy   Participation Level:  Active  Participation Quality:  Attentive and Sharing  Affect:  Blunted  Cognitive:  Oriented  Insight:  Engaged  Engagement in Therapy:  Engaged  Modes of Intervention:   Activity, Exploration  Ambrose Mantle, LCSW 03/04/2013, 12:30pm

## 2013-03-04 NOTE — Progress Notes (Signed)
Patient ID: Dale [name], male   DOB: 04/10/1965, 47 y.o.   MRN: [id number] Psychoeducational Group Note  Date:  03/04/2013 Time:  0930am  Group Topic/Focus:  Making Healthy Choices:   The focus of this group is to help patients identify negative/unhealthy choices they were using prior to admission and identify positive/healthier coping strategies to replace them upon discharge.  Participation Level:  Active  Participation Quality:  Appropriate  Affect:  Angry and Anxious  Cognitive:  Lacking  Insight:  Resistant  Engagement in Group:  Resistant  Additional Comments:  Inventory and Psychoeducational group   Dale [name] 03/04/2013,11:23 AM
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
## 2013-03-04 NOTE — Progress Notes (Signed)
Patient ID: Dale [name], male   DOB: [birth year], 47 y.o.   MRN: [id number] 03-04-13 @ 1000 nursing  1:1 note: d: pt has been difficult, angry and preoccupied this am. Am medications were taken to his room due to not coming to the med window. He came to am group late. He is hostile and verbally aggressive. A: his 1:1 order and no roommate order was renewed. RN attempted to have his coumadin dosed in the am, but pharmacy has not dosed it yet. R: pt continues to be hostile. On his inventory sheet he didn't address SI. The 1:1 continues.

## 2013-03-04 NOTE — Progress Notes (Signed)
Pt observed resting in bed. Awakened briefly but returned to sleep, audibly snoring. No distress noted, complaints voiced. 1:1 obs remains in place for safety and pt remains safe at this time. Lawrence Marseilles

## 2013-03-04 NOTE — Progress Notes (Signed)
Pt observed resting in bed without difficulty. RR WNL, even and unlabored. No acute distress noted. Level I obs in place for safety and pt is safe. Dale [name]

## 2013-03-05 DIAGNOSIS — F333 Major depressive disorder, recurrent, severe with psychotic symptoms: Secondary | ICD-10-CM | POA: Diagnosis present

## 2013-03-05 DIAGNOSIS — F4310 Post-traumatic stress disorder, unspecified: Secondary | ICD-10-CM | POA: Diagnosis present

## 2013-03-05 LAB — PROTIME-INR: INR: 2.71 — ABNORMAL HIGH (ref 0.00–1.49)

## 2013-03-05 MED ORDER — TRAZODONE HCL 100 MG PO TABS
100.0000 mg | ORAL_TABLET | Freq: Every day | ORAL | Status: DC
  Administered 2013-03-05 – 2013-03-07 (×3): 100 mg via ORAL
  Filled 2013-03-05 (×5): qty 1

## 2013-03-05 MED ORDER — HYDROXYZINE HCL 25 MG PO TABS
25.0000 mg | ORAL_TABLET | Freq: Four times a day (QID) | ORAL | Status: DC | PRN
  Administered 2013-03-05: 25 mg via ORAL
  Filled 2013-03-05: qty 1

## 2013-03-05 MED ORDER — SERTRALINE HCL 25 MG PO TABS
75.0000 mg | ORAL_TABLET | Freq: Every day | ORAL | Status: DC
  Administered 2013-03-06 – 2013-03-08 (×3): 75 mg via ORAL
  Filled 2013-03-05 (×7): qty 3

## 2013-03-05 MED ORDER — RISPERIDONE 2 MG PO TBDP
2.0000 mg | ORAL_TABLET | Freq: Every day | ORAL | Status: DC
  Administered 2013-03-05 – 2013-03-06 (×2): 2 mg via ORAL
  Filled 2013-03-05 (×5): qty 1

## 2013-03-05 MED ORDER — GABAPENTIN 300 MG PO CAPS
900.0000 mg | ORAL_CAPSULE | Freq: Two times a day (BID) | ORAL | Status: DC
  Administered 2013-03-05 – 2013-03-08 (×6): 900 mg via ORAL
  Filled 2013-03-05 (×10): qty 3

## 2013-03-05 MED ORDER — CARBAMAZEPINE ER 200 MG PO TB12
200.0000 mg | ORAL_TABLET | Freq: Two times a day (BID) | ORAL | Status: DC
  Administered 2013-03-05 – 2013-03-11 (×12): 200 mg via ORAL
  Filled 2013-03-05 (×16): qty 1

## 2013-03-05 NOTE — Progress Notes (Signed)
Observation note: Pt observed in the dayroom attending group with sitter present. Pt appears to be attentive. Pt safety maintained.  

## 2013-03-05 NOTE — Progress Notes (Signed)
Pt observed resting in bed with eyes closed. No distress noted. RR even and unlabored. 1:1 obs in place for pt safety and pt remains safe. Lawrence Marseilles
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
## 2013-03-05 NOTE — Progress Notes (Signed)
Pt resting in bed with eyes closed. Snoring audibly. RR WNL. 1:1 obs in place for safety and pt is safe. Lawrence Marseilles

## 2013-03-05 NOTE — Tx Team (Signed)
  Interdisciplinary Treatment Plan Update   Date Reviewed:  03/05/2013  Time Reviewed:  8:23 AM  Progress in Treatment:   Attending groups: Yes Participating in groups: Yes Taking medication as prescribed: Yes  Tolerating medication: Yes Family/Significant other contact made: No Patient understands diagnosis: Yes As evidenced by asking for help with multiple symptoms, including PTSD and psychosis Discussing patient identified problems/goals with staff: Yes  See initial care plan Medical problems stabilized or resolved: Yes Denies suicidal/homicidal ideation: Yes Patient has not harmed self or others: Yes  For review of initial/current patient goals, please see plan of care.  Estimated Length of Stay:  4-5 days  Reason for Continuation of Hospitalization: Aggression Hallucinations Medication stabilization Other; describe Agitated mood  New Problems/Goals identified:  N/A  Discharge Plan or Barriers:   unknown; stated he wants to get into long term psychiatric care.  Will refer to [facility] while we are stabilizing him here.  Additional Comments:  Attendees:  Signature: Thedore Mins, MD 03/05/2013 8:23 AM   Signature: Richelle Ito, LCSW 03/05/2013 8:23 AM  Signature: Fransisca Kaufmann, NP 03/05/2013 8:23 AM  Signature: Joslyn Devon, RN 03/05/2013 8:23 AM  Signature: Liborio Nixon, RN 03/05/2013 8:23 AM  Signature:  03/05/2013 8:23 AM  Signature:   03/05/2013 8:23 AM  Signature:    Signature:    Signature:    Signature:    Signature:    Signature:      Scribe for Treatment Team:   Richelle Ito, LCSW  03/05/2013 8:23 AM
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
## 2013-03-05 NOTE — Progress Notes (Signed)
Observation note: Pt observed in the bathroom shaving his face with sitter present. Pt reports hearing voices, people talking. Pt reports feeling paranoid because of the hallucinations and feeling like people are after him. Pt compliant with taking meds and attending group this morning. Pt refused B/p med, stating that it gives him headaches and that he usually don't take it everyday. Pt gait unsteady d/t pt having chronic pain in his right leg. Pt continues to use a wheelchair. Pt remains on 1:1 observation for safety. Pt safety maintained.
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
## 2013-03-05 NOTE — Progress Notes (Signed)
ANTICOAGULATION CONSULT NOTE - Follow Up Consult  Pharmacy Consult for Coumadin Indication:h/o clots  Allergies  Allergen Reactions  . Ibuprofen Hives    Patient Measurements: Height: 5\' 10"  (177.8 cm) Weight: 194 lb 0.1 oz (88 kg) IBW/kg (Calculated) : 73   Vital Signs:    Labs:  Recent Labs  03/03/13 0634 03/05/13 0625  LABPROT 28.7* 27.8*  INR 2.82* 2.71*    Estimated Creatinine Clearance: 68.3 ml/min (by C-G formula based on Cr of 1.51).   Medications:  Scheduled:  . carbamazepine  200 mg Oral BID  . famotidine  40 mg Oral QHS  . feeding supplement (ENSURE COMPLETE)  237 mL Oral BID BM  . gabapentin  600 mg Oral TID  . hydrochlorothiazide  25 mg Oral Daily  . multivitamin with minerals  1 tablet Oral Daily  . nicotine  21 mg Transdermal Daily  . risperiDONE  1 mg Oral QHS  . sertraline  50 mg Oral Daily  . traMADol  50 mg Oral Q12H  . warfarin  6 mg Oral Daily  . Warfarin - Pharmacist Dosing Inpatient   Does not apply q1800    Assessment: INR at goal no problems with therapy noted  Goal of Therapy:  INR 2-3    Plan:  Continue home dose of Coumadin 6 mg daily.  Patient request to take in morning as he takes it that way at home.  PT/INR wed AM labs    Charyl Dancer 03/05/2013,7:40 AM

## 2013-03-05 NOTE — Progress Notes (Signed)
D   Pt uses a wheelchair and has an unsteady gait  He interacts appropriately with others and seems logical and coherent in his thinking    A   Medications administered and effectiveness monitored   Verbal support given   Pt on 1:1 for safety R   Pt safe at present

## 2013-03-05 NOTE — Progress Notes (Signed)
Observation note: Pt observed in the dayroom engaged with his girlfriend and her son. Pt continues to use the wheelchair d/t unsteady gait. No threatening behaviors noted by Clinical research associate or MHTs caring for pt. Sitter reported that pt have been anxious with intermittent blackouts during the day. Sitter able to arouse pt. Pt remains on 1:1 observation for safety. Pt safety maintain.

## 2013-03-06 ENCOUNTER — Encounter (HOSPITAL_COMMUNITY): Payer: Self-pay | Admitting: Emergency Medicine

## 2013-03-06 LAB — PROTIME-INR
INR: 2.52 — ABNORMAL HIGH (ref 0.00–1.49)
Prothrombin Time: 26.3 seconds — ABNORMAL HIGH (ref 11.6–15.2)

## 2013-03-06 MED ORDER — CEPHALEXIN 500 MG PO CAPS
500.0000 mg | ORAL_CAPSULE | Freq: Once | ORAL | Status: AC
Start: 2013-03-06 — End: 2013-03-06
  Administered 2013-03-06: 500 mg via ORAL
  Filled 2013-03-06: qty 1

## 2013-03-06 MED ORDER — TRAMADOL HCL 50 MG PO TABS: 50.0000 mg | ORAL_TABLET | Freq: Four times a day (QID) | ORAL | Status: DC | PRN

## 2013-03-06 MED ORDER — OXYCODONE-ACETAMINOPHEN 5-325 MG PO TABS
2.0000 | ORAL_TABLET | Freq: Once | ORAL | Status: AC
  Administered 2013-03-06: 2 via ORAL
  Filled 2013-03-06: qty 2

## 2013-03-06 MED ORDER — CEPHALEXIN 500 MG PO CAPS: 500.0000 mg | ORAL_CAPSULE | Freq: Four times a day (QID) | ORAL | Status: DC

## 2013-03-06 MED ORDER — CEPHALEXIN 500 MG PO CAPS
500.0000 mg | ORAL_CAPSULE | Freq: Four times a day (QID) | ORAL | Status: DC
  Administered 2013-03-06 – 2013-03-14 (×31): 500 mg via ORAL
  Filled 2013-03-06 (×2): qty 1
  Filled 2013-03-06: qty 2
  Filled 2013-03-06 (×12): qty 1
  Filled 2013-03-06 (×2): qty 2
  Filled 2013-03-06 (×13): qty 1
  Filled 2013-03-06: qty 2
  Filled 2013-03-06 (×6): qty 1
  Filled 2013-03-06: qty 2
  Filled 2013-03-06 (×2): qty 1

## 2013-03-06 MED ORDER — CLOTRIMAZOLE 1 % EX CREA
TOPICAL_CREAM | Freq: Two times a day (BID) | CUTANEOUS | Status: DC
  Administered 2013-03-06 – 2013-03-14 (×12): via TOPICAL
  Filled 2013-03-06 (×6): qty 15
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
## 2013-03-06 NOTE — Progress Notes (Signed)
[facility] Post 1:1 Observation Documentation  For the first (8) hours following discontinuation of 1:1 precautions, a progress note entry by nursing staff should be documented at least every 2 hours, reflecting the patient's behavior, condition, mood, and conversation.  Use the progress notes for additional entries.  Time 1:1 discontinued: 1534  Patient's Behavior:  Pt observed sitting in the cafeteria eating dinner. Pt cooperative at this time. No complaints verbalized by pt.  Patient's Condition:  Pt safety maintained. 15 minute checks performed for safety.  Patient's Conversation:  Logical/coherent. Engaged with other pts   Layla Barter 03/06/2013, 5:52 PM

## 2013-03-06 NOTE — Progress Notes (Signed)
Adult Psychoeducational Group Note  Date:  03/06/2013 Time:  11:55 PM  Group Topic/Focus:  Wrap-Up Group:   The focus of this group is to help patients review their daily goal of treatment and discuss progress on daily workbooks.  Participation Level:  Active  Participation Quality:  Appropriate  Affect:  Appropriate  Cognitive:  Appropriate  Insight: Appropriate  Engagement in Group:  Engaged  Modes of Intervention:  Support  Additional Comments:  Patient attended and participated in group tonight. He reports having a rough day both physical and mentally. He advised that his leg kept hurting. He was sent out to the hospital and it was found that he had cellulitis. He was given some medication for it. For his recovery, his Child psychotherapist, made arrangement for him to be discharged to [facility]. He is still having block out and still hearing voices.  Lita Mains [facility] 03/06/2013, 11:55 PM

## 2013-03-06 NOTE — Progress Notes (Signed)
[facility] Post 1:1 Observation Documentation  For the first (8) hours following discontinuation of 1:1 precautions, a progress note entry by nursing staff should be documented at least every 2 hours, reflecting the patient's behavior, condition, mood, and conversation.  Use the progress notes for additional entries.  Time 1:1 discontinued:  4pm  Patient's Behavior:  Appropriate to circumstance  Patient's Condition:  Stable  Interacting well going to groups   A little irritable    Patient's Conversation:  Logical and coherent  Dale [name] 03/06/2013, 9:38 PM

## 2013-03-06 NOTE — ED Notes (Signed)
Pt reports pain in his LLE, sts feels like when he was first diagnosed with blood clots. Swelling and redness noted to LLE, it is warm to touch. Pt at this time denies any SI/HI, sts he does hear voices at times and has episodes of "blacking out".

## 2013-03-06 NOTE — Progress Notes (Signed)
VASCULAR LAB PRELIMINARY  PRELIMINARY  PRELIMINARY  PRELIMINARY  Left lower extremity venous duplex completed.    Preliminary report:  Left:  No evidence of DVT, superficial thrombosis, or Baker's cyst.  Danila Eddie, RVS 03/06/2013, 12:02 PM

## 2013-03-06 NOTE — Progress Notes (Signed)
[facility] Post 1:1 Observation Documentation  For the first (8) hours following discontinuation of 1:1 precautions, a progress note entry by nursing staff should be documented at least every 2 hours, reflecting the patient's behavior, condition, mood, and conversation.  Use the progress notes for additional entries.  Time 1:1 discontinued:  1534  Patient's Behavior:  Pt sitting in dayroom watching television.  Patient's Condition:  Pt safety maintained.   Patient's Conversation:  Logical/coherent   Dale [name] L 03/06/2013, 4:17 PM

## 2013-03-06 NOTE — ED Notes (Signed)
Pt transferred from [facility] via EMS with c/o possible DVT. Per EMS staff at [facility] reports noticed swelling, redness and warmth to left leg last night. Pt reports hx of DVT in same leg. Pt is on coumadin.

## 2013-03-06 NOTE — Progress Notes (Signed)
[facility] Post 1:1 Observation Documentation  For the first (8) hours following discontinuation of 1:1 precautions, a progress note entry by nursing staff should be documented at least every 2 hours, reflecting the patient's behavior, condition, mood, and conversation.  Use the progress notes for additional entries.  Time 1:1 discontinued:  4pm  Patient's Behavior: cooperative and appropriate   Patient's Condition:  Stable   Patient's Conversation:  Logical and coherent  Dale [name] 03/06/2013, 11:53 PM

## 2013-03-06 NOTE — Progress Notes (Signed)
Observation note: Pt sent to [facility] via EMS. Pt c/o left leg swelling and pain. Pt assessed by writer this morning, edema to left leg, tenderness noted, and pos Homan's sign. MD and NP made aware. Pt assessed by Lloyd Huger, NP. Pt transferred to [facility] per Lloyd Huger, NP.

## 2013-03-06 NOTE — Progress Notes (Signed)
[facility] Post 1:1 Observation Documentation  For the first (8) hours following discontinuation of 1:1 precautions, a progress note entry by nursing staff should be documented at least every 2 hours, reflecting the patient's behavior, condition, mood, and conversation.  Use the progress notes for additional entries.  Time 1:1 discontinued:  4pm  Patient's Behavior:  appropriate  Patient's Condition:  Stable attending groups   Patient's Conversation:  Logical and coherent  Andrena Mews 03/06/2013, 9:37 PM

## 2013-03-06 NOTE — ED Notes (Signed)
Bed: WA25 Expected date:  Expected time:  Means of arrival:  Comments: 

## 2013-03-06 NOTE — ED Notes (Signed)
US AT BS

## 2013-03-06 NOTE — Progress Notes (Signed)
Pt resting in bed with eyes closed  No distress noted   Pt on 1:1 for safety due to an unsteady gait and being a high fall risk   Pt safe at present
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
## 2013-03-06 NOTE — Progress Notes (Signed)
Pt continues to be on a 1:1 for safety  He has an unsteady gait and uses a wheelchaair to get around   He is presently safe

## 2013-03-06 NOTE — Progress Notes (Signed)
[facility] CL spoke with patient about [employer]. Patient confirmed his PCP is [facility], but declined my offer to set up a follow-up apt.

## 2013-03-07 LAB — PROTIME-INR: INR: 2.68 — ABNORMAL HIGH (ref 0.00–1.49)

## 2013-03-07 MED ORDER — RISPERIDONE 2 MG PO TBDP
3.0000 mg | ORAL_TABLET | Freq: Every day | ORAL | Status: DC
  Administered 2013-03-07 – 2013-03-13 (×7): 3 mg via ORAL
  Filled 2013-03-07 (×8): qty 1

## 2013-03-07 NOTE — Progress Notes (Signed)
ANTICOAGULATION CONSULT NOTE - Follow Up Consult  Pharmacy Consult for Coumadin Indication: h/o clots  Allergies  Allergen Reactions  . Ibuprofen Hives    Patient Measurements: Height: 5\' 10"  (177.8 cm) Weight: 194 lb 0.1 oz (88 kg) IBW/kg (Calculated) : 73   Vital Signs: Temp: 97.2 F (36.2 C) (12/03 0730) BP: 94/67 mmHg (12/03 0731) Pulse Rate: 97 (12/03 0731)  Labs:  Recent Labs  03/05/13 0625 03/06/13 1050 03/07/13 0620  LABPROT 27.8* 26.3* 27.6*  INR 2.71* 2.52* 2.68*    Estimated Creatinine Clearance: 68.3 ml/min (by C-G formula based on Cr of 1.51).   Medications:  Prescriptions prior to admission  Medication Sig Dispense Refill  . clotrimazole-betamethasone (LOTRISONE) cream Apply 1 application topically 2 (two) times daily.  30 g  1  . gabapentin (NEURONTIN) 600 MG tablet Take 1 tablet (600 mg total) by mouth 3 (three) times daily.  90 tablet  3  . hydrochlorothiazide (HYDRODIURIL) 25 MG tablet Take 1 tablet (25 mg total) by mouth daily.  30 tablet  3  . Multiple Vitamins-Minerals (MULTIVITAMIN WITH MINERALS) tablet Take 1 tablet by mouth daily.  30 tablet  0  . ranitidine (ZANTAC) 300 MG tablet Take 1 tablet (300 mg total) by mouth at bedtime.  30 tablet  3  . warfarin (COUMADIN) 7.5 MG tablet Take 1 tablet (7.5 mg total) by mouth daily.  30 tablet  1    Assessment: INR at goal Goal of Therapy:  INR 2-3    Plan:  INR 2.68 today.  Continue home dose Coumadin 6 mg daily.  Pt wants to take in AM.  PT/INR Friday am labs  Charyl Dancer 03/07/2013,3:24 PM

## 2013-03-07 NOTE — BHH Group Notes (Signed)
[facility] LCSW Aftercare Discharge Planning Group Note   03/07/2013 2:00 PM  Participation Quality:  Engaged  Mood/Affect:  Blunted and Depressed  Depression Rating:  8  Anxiety Rating:  8  Thoughts of Suicide:  No Will you contract for safety?   NA  Current AVH:  Yes  Plan for Discharge/Comments:  Pt c/o  "having blackouts and hearing voices."  Needy.  Asked if I talked to his girlfriend.  Let him know I had, and confirmed that he is not welcome to return there.  When asked if he would return to his parents home if he does not transfer to [facility] from here, he replied "I can't even think about that now.  I just need to get my mind right."  Transportation Means: unk  Supports: parents  [place], Ramey B

## 2013-03-07 NOTE — Progress Notes (Signed)
Adult Psychoeducational Group Note  Date:  03/07/2013 Time:  11:00AM Group Topic/Focus:  Personal Development  Participation Level:  Did Not Attend   Additional Comments: Pt. Didn't attend group.  Bing Plume D 03/07/2013, 1:28 PM
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
## 2013-03-07 NOTE — Progress Notes (Signed)
Adult Psychoeducational Group Note  Date:  03/07/2013 Time:  1:48 PM  Group Topic/Focus:  Dimensions of Wellness:   The focus of this group is to introduce the topic of wellness and discuss the role each dimension of wellness plays in total health.  Participation Level:  Did Not Attend  Participation Quality:  Did Not Attend  Affect:  Did Not Attend  Cognitive:  Did Not Attend  Insight: None  Engagement in Group:  Did Not Attend  Modes of Intervention:  Education  Additional Comments:  Patient did not attend psychoeducation group.  Merleen Milliner 03/07/2013, 1:48 PM

## 2013-03-07 NOTE — BHH Group Notes (Signed)
[facility] [employer] Group Therapy  03/07/2013  1:57 PM  Type of Therapy:  [employer] Presentation   Participation Level:  Active  Participation Quality:  Appropriate  Affect:  Depressed  Cognitive:  Appropriate  Insight:  Engaged  Engagement in Therapy:  Engaged  Modes of Intervention:  Discussion, Education and Socialization   Summary of Progress/Problems:  Dale [name] from [employer] came to present his recovery story and play the guitar.  Dale [name] stared at the ground and wall while the speaker told his story.  He asked the speaker about crisis management at [facility].  He discussed with the speaker about his "crisis attack" during Thanksgiving and his goal to go to long term care.  He stated that he does not know how to control his symptoms, even with the medications.  He applauded after each song and thanked the speaker for coming to group.    [name]   03/07/2013  1:57 PM

## 2013-03-07 NOTE — Progress Notes (Signed)
D   Pt has still been using his wheelchair to get around and has complained of having cellulitis and how painful it is   He said the tramadol was not enough to manage the pain  He said it is ok for a while but wears off quickly   He took a shower and applied the new cream to the affected parts   He is irritable sad and anxious but is cooperative   He attends groups A   Verbal support given   Medications administered and effectiveness monitored   Q 15 min checks R   Pt safe at present

## 2013-03-07 NOTE — Progress Notes (Signed)
The focus of this group is to help patients review their daily goal of treatment and discuss progress on daily workbooks. Pt attended the evening group session and responded to all discussion prompts from the Writer. Pt reported having had a bad day on the unit and was not pleased to have been taken off his 1:1 observation. Pt also shared that he has been experiencing daily blackouts, though his definition of blackout appears to differ from the classical definition. "I space out and my mind just wanders and it's like I'm not there anymore." Pt did ask the Writer for $50, which he was not given. "I heard in the shower earlier today that you were giving out $50. I'd like to have $50." Pt appeared to be confused and also guarded.

## 2013-03-07 NOTE — Progress Notes (Signed)
D: Pt denies SI/HI/AVH. Pt reports having blackouts and anxiety. Per pt, he have on and off blackouts that usually occurs in the evening. Pt is worried because he is unaware of his actions when he have blackouts. No blackouts noted by Clinical research associate. Pt c/o lt leg pain d/t cellulitis and is requesting an increase in his pain meds. Pt compliant with taking meds and attending groups. A: medications administered as ordered per MD. Verbal support given. Pt encouraged to attend groups. 15 minute checks performed for safety. R: Pt safety maintained.

## 2013-03-08 MED ORDER — PRAZOSIN HCL 2 MG PO CAPS
2.0000 mg | ORAL_CAPSULE | Freq: Every day | ORAL | Status: DC
  Administered 2013-03-08: 2 mg via ORAL
  Filled 2013-03-08 (×3): qty 1

## 2013-03-08 MED ORDER — HYDROXYZINE HCL 25 MG PO TABS
25.0000 mg | ORAL_TABLET | Freq: Three times a day (TID) | ORAL | Status: DC
  Administered 2013-03-08 – 2013-03-12 (×13): 25 mg via ORAL
  Filled 2013-03-08 (×16): qty 1

## 2013-03-08 MED ORDER — SERTRALINE HCL 100 MG PO TABS
100.0000 mg | ORAL_TABLET | Freq: Every day | ORAL | Status: DC
  Administered 2013-03-09 – 2013-03-12 (×4): 100 mg via ORAL
  Filled 2013-03-08 (×6): qty 1

## 2013-03-08 MED ORDER — GABAPENTIN 400 MG PO CAPS
400.0000 mg | ORAL_CAPSULE | Freq: Three times a day (TID) | ORAL | Status: DC
  Administered 2013-03-08 – 2013-03-12 (×12): 400 mg via ORAL
  Filled 2013-03-08 (×15): qty 1

## 2013-03-08 NOTE — Tx Team (Signed)
  Interdisciplinary Treatment Plan Update   Date Reviewed:  03/08/2013  Time Reviewed:  5:02 PM  Progress in Treatment:   Attending groups: Yes Participating in groups: Yes Taking medication as prescribed: Yes  Tolerating medication: Yes Family/Significant other contact made: Yes  Patient understands diagnosis: Yes  Discussing patient identified problems/goals with staff: Yes Medical problems stabilized or resolved: Yes Denies suicidal/homicidal ideation: Yes Patient has not harmed self or others: Yes  For review of initial/current patient goals, please see plan of care.  Estimated Length of Stay:  4-5 days  Reason for Continuation of Hospitalization: Anxiety Depression Hallucinations Medication stabilization  New Problems/Goals identified:  N/A  Discharge Plan or Barriers:   On waiting list for [facility].  Pt has not identified a "Plan B" at thie point despite encouragement from CSW  Additional Comments:   " I am not feeling good at all, I feel drowsy, anxious and irritated."   Patient continues to report that he is hearing voices, irritable, anxious and feeling like he wants to black out. He endorsed racing thoughts, mood lability, getting easily agitated, having nightmares/vivid dreams of past traumas and feeling like he is about to lose control of himself. Patient requesting for higher level of care, he does not think he is getting any help from being here. He states that he needs a long term facility.  [facility]   Attendees:  Signature: Thedore Mins, MD 03/08/2013 5:02 PM   Signature: Richelle Ito, LCSW 03/08/2013 5:02 PM  Signature: Fransisca Kaufmann, NP 03/08/2013 5:02 PM  Signature: Joslyn Devon, RN 03/08/2013 5:02 PM  Signature: Liborio Nixon, RN 03/08/2013 5:02 PM  Signature:  03/08/2013 5:02 PM  Signature:   03/08/2013 5:02 PM  Signature:    Signature:    Signature:    Signature:    Signature:    Signature:      Scribe for  Treatment Team:   Richelle Ito, LCSW  03/08/2013 5:02 PM

## 2013-03-08 NOTE — Progress Notes (Signed)
Seen and agreed. Biagio Snelson, MD 

## 2013-03-08 NOTE — Progress Notes (Signed)
D: Patient appropriate and cooperative with staff and peers. His affect and mood is anxious. He reported on the self inventory sheet that he's sleeping poorly, appetite is good, energy level is normal and ability to pay attention is improving. Patient rated depression "7" and feelings of hopelessness "9". He's attending groups and interactive with peers in the milieu. Patient compliant with medication regimen.  A: Support and encouragement provided to patient. Scheduled medications per ordering MD. Maintain Q15 minute checks for safety.  R: Patient receptive. Endorses auditory hallucinations, but contracts for safety. Denies SI/HI and visual hallucinations. Patient remains safe.
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
## 2013-03-08 NOTE — Progress Notes (Signed)
Seen and agreed. Winta Barcelo, MD 

## 2013-03-08 NOTE — Progress Notes (Signed)
Pt has been in the dayroom all evening.  He attended evening wrap-up group.  Pt reports he feels about the same as he did yesterday and has not made much improvement.  He continues to c/o pain in his L leg from cellulitis.  He is med compliant on the unit.  He is receiving scheduled pain medications.  He is unsure what his discharge plans are as he cannot return to the home of his girlfriend.  He says he is trying to get his disability.  Pt denies SI/HI/AV at this time.  Support and encouragement offered.  Safety maintained with q15 minute checks.
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
## 2013-03-09 LAB — PROTIME-INR: Prothrombin Time: 22.6 seconds — ABNORMAL HIGH (ref 11.6–15.2)

## 2013-03-09 MED ORDER — SALINE SPRAY 0.65 % NA SOLN
1.0000 | NASAL | Status: DC | PRN
  Administered 2013-03-09 – 2013-03-12 (×4): 1 via NASAL
  Filled 2013-03-09: qty 44

## 2013-03-09 MED ORDER — WARFARIN SODIUM 4 MG PO TABS
4.0000 mg | ORAL_TABLET | Freq: Once | ORAL | Status: AC
  Administered 2013-03-09: 4 mg via ORAL
  Filled 2013-03-09: qty 1

## 2013-03-09 MED ORDER — PRAZOSIN HCL 5 MG PO CAPS
5.0000 mg | ORAL_CAPSULE | Freq: Every day | ORAL | Status: DC
  Administered 2013-03-09 – 2013-03-12 (×4): 5 mg via ORAL
  Filled 2013-03-09 (×6): qty 5

## 2013-03-09 NOTE — [facility] Group Notes (Signed)
[facility] LCSW Aftercare Discharge Planning Group Note   03/09/2013 10:11 AM  Participation Quality:  Engaged  Mood/Affect:  Depressed and Flat  Depression Rating:  8  Anxiety Rating:  8  Thoughts of Suicide:  No Will you contract for safety?   NA  Current AVH:  Yes  Plan for Discharge/Comments:  "I'm trying to get focused."  States he did not sleep well last night.  Let him know he is now on waiting list for [facility].  He expressed appreciation.  "That is exactly what i need."  Transportation Means:  family  Supports: family  [place], Dale [name]

## 2013-03-09 NOTE — Progress Notes (Signed)
D   Pt is pleasant and cooperative    He attended [place] group   He interacts well with others   He uses a wheelchair to get around and continues to complain of pain in his leg   He talked about having several blackouts today but did not really describe a true blackout  A   Verbal support given  Medications administered and effectiveness monitored   Q 15 min checks R   Pt safe at present

## 2013-03-09 NOTE — Progress Notes (Signed)
D: Patient presents with anxious mood and affect. He reported on the self inventory sheet that he's sleeping fair, good appetite, normal energy level and improving ability to pay attention. Patient rated depression "7" and feelings of hopelessness "8". Continues to report having blackouts and auditory hallucinations; contracts for safety. Patient is participating in groups and tolerating medications well.  A: Support and encouragement provided to patient. Administered scheduled medications per ordering MD. Monitor Q15 minute checks for safety.  R: Patient receptive. Denies SI/HI and visual hallucinations. Patient remains safe on the unit.

## 2013-03-09 NOTE — Progress Notes (Signed)
ANTICOAGULATION CONSULT NOTE - Follow Up Consult  Pharmacy Consult for Coumadin Indication:h/o clots  Allergies  Allergen Reactions  . Ibuprofen Hives    Patient Measurements: Height: 5\' 10"  (177.8 cm) Weight: 194 lb 0.1 oz (88 kg) IBW/kg (Calculated) : 73   Vital Signs: Temp: 97.4 F (36.3 C) (12/05 0600) BP: 118/72 mmHg (12/05 0601) Pulse Rate: 103 (12/05 0601)  Labs:  Recent Labs  03/07/13 0620 03/09/13 0610  LABPROT 27.6* 22.6*  INR 2.68* 2.06*    Estimated Creatinine Clearance: 68.3 ml/min (by C-G formula based on Cr of 1.51).   Medications:  Scheduled:  . carbamazepine  200 mg Oral BID  . cephALEXin  500 mg Oral Q6H  . clotrimazole   Topical BID  . famotidine  40 mg Oral QHS  . feeding supplement (ENSURE COMPLETE)  237 mL Oral BID BM  . gabapentin  400 mg Oral TID  . hydrochlorothiazide  25 mg Oral Daily  . hydrOXYzine  25 mg Oral TID  . multivitamin with minerals  1 tablet Oral Daily  . nicotine  21 mg Transdermal Daily  . prazosin  2 mg Oral QHS  . risperiDONE  3 mg Oral QHS  . sertraline  100 mg Oral Daily  . traMADol  50 mg Oral Q12H  . warfarin  4 mg Oral Once  . warfarin  6 mg Oral Daily  . Warfarin - Pharmacist Dosing Inpatient   Does not apply q1800    Assessment: INR dropped to low end of goal Goal of Therapy:  INR 2-3    Plan:  Coumadin 4 mg now to = total of 10 mg x 1 today.  Coumadin 6 mg given this am as pt wants to take coumadin in am. PT/INR in am     Charyl Dancer 03/09/2013,11:33 AM

## 2013-03-09 NOTE — [facility] Group Notes (Signed)
[facility] LCSW Group Therapy  03/09/2013  1:05 PM  Type of Therapy:  Group therapy  Participation Level:  Active  Participation Quality:  Attentive  Affect:  Flat  Cognitive:  Oriented  Insight:  Limited  Engagement in Therapy:  Limited  Modes of Intervention:  Discussion, Socialization  Summary of Progress/Problems:  Chaplain was here to lead a group on themes of hope and courage. "I realize that I don't deal with rejection very well."  "I want to better myself."  "I'm trying ot learn as much as I can while I am here.  Appeared to be be genuine in his sentiments, and was appreciative of his time here.  Talked at length about his father as his hero and mentor, and how his entrance to the hospital has given him hope that their relationship can find a frim footing. [name] B 03/09/2013 1:41 PM
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
## 2013-03-09 NOTE — Progress Notes (Signed)
[facility] Group Notes:  (Nursing/MHT/Case Management/Adjunct)  Date:  03/09/2013  Time:  8:00p.m.   Type of Therapy:    Participation Level:  Active  Participation Quality:  Appropriate  Affect:  Appropriate  Cognitive:  Appropriate  Insight:  Good  Engagement in Group:  Engaged  Modes of Intervention:  Education  Summary of Progress/Problems: The patient spoke at great length in group this evening. He began by stating that any day that he wakes up is a good day for him. Next, he mentioned that he was grateful for the fact that one of his peers spoke with him and provided him with a lot of encouragement. He then shared with the group of the importance of him staying on his medication. He explained that he tends to shout and or fight with people when he isn't on his medication. In addition, he spoke of the need to be around people that are positive since a positive message outweighs a negative message from people. In terms of the theme of the day, he shared that he intends to stay on his medication and attend all of his psychiatry appointments as a relapse prevention strategy.   Denvil Canning S 03/09/2013, 10:05 PM

## 2013-03-10 LAB — PROTIME-INR
INR: 2.24 — ABNORMAL HIGH (ref 0.00–1.49)
Prothrombin Time: 24.1 seconds — ABNORMAL HIGH (ref 11.6–15.2)

## 2013-03-10 MED ORDER — WARFARIN SODIUM 7.5 MG PO TABS
7.5000 mg | ORAL_TABLET | Freq: Once | ORAL | Status: AC
  Administered 2013-03-10: 7.5 mg via ORAL
  Filled 2013-03-10: qty 1

## 2013-03-10 MED ORDER — TRAMADOL HCL 50 MG PO TABS
50.0000 mg | ORAL_TABLET | Freq: Three times a day (TID) | ORAL | Status: DC
  Administered 2013-03-10 – 2013-03-14 (×11): 50 mg via ORAL
  Filled 2013-03-10 (×12): qty 1

## 2013-03-10 NOTE — Progress Notes (Signed)
ANTICOAGULATION CONSULT NOTE - Follow Up Consult  Pharmacy Consult for Coumadin Indication: History of Clots  Allergies  Allergen Reactions  . Ibuprofen Hives    Patient Measurements: Height: 5\' 10"  (177.8 cm) Weight: 194 lb 0.1 oz (88 kg) IBW/kg (Calculated) : 73 Heparin Dosing Weight:   Vital Signs: BP: 135/80 mmHg (12/05 2121) Pulse Rate: 102 (12/05 2121)  Labs:  Recent Labs  03/09/13 0610 03/10/13 0635  LABPROT 22.6* 24.1*  INR 2.06* 2.24*    Estimated Creatinine Clearance: 68.3 ml/min (by C-G formula based on Cr of 1.51).   Medications:  Scheduled:  . carbamazepine  200 mg Oral BID  . cephALEXin  500 mg Oral Q6H  . clotrimazole   Topical BID  . famotidine  40 mg Oral QHS  . feeding supplement (ENSURE COMPLETE)  237 mL Oral BID BM  . gabapentin  400 mg Oral TID  . hydrochlorothiazide  25 mg Oral Daily  . hydrOXYzine  25 mg Oral TID  . multivitamin with minerals  1 tablet Oral Daily  . nicotine  21 mg Transdermal Daily  . prazosin  5 mg Oral QHS  . risperiDONE  3 mg Oral QHS  . sertraline  100 mg Oral Daily  . traMADol  50 mg Oral Q12H  . warfarin  7.5 mg Oral Once  . Warfarin - Pharmacist Dosing Inpatient   Does not apply q1800    Assessment: Patient INR rose to 2.24 after a 10 mg dose of Coumadin yesterday  Goal of Therapy:  INR 2-3    Plan:  Will give Coumadin 7.5 mg this morning (home dose)  Patient wants Coumadin in the AM  Pamala Duffel L 03/10/2013,7:59 AM
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
## 2013-03-10 NOTE — Progress Notes (Signed)
Patient ID: Dale [name], male   DOB: 1966/03/04, 47 y.o.   MRN: [id number] D: patient remains depressed and sad.  He endorses auditory hallucinations with no SI/HI.  He has several physical complaints today.  He states his right calf is painful, rating it an 8.  He received his tramadol this morning for his pain.  Patient feels hopeless and depressed.  He attended group with minimal participation.  Patient states, "I'm not angry with ya'll. You don't understand.  I really don't feel well."  A: continue to monitor medication management and MD orders.  Safety checks completed every 15 minutes per protocol.  R: patient is cooperative and his behavior is appropriate.

## 2013-03-10 NOTE — [facility] Group Notes (Signed)
[facility] Group Notes:  (Clinical Social Work)  03/10/2013  11:15-11:45AM  Summary of Progress/Problems:   The main focus of today's process group was for the patient to identify ways in which they have in the past sabotaged their own recovery and reasons they may have done this/what they received from doing it.  We then worked to identify a specific plan to avoid doing this when discharged from the hospital for this admission.  The patient expressed that he is in the hospital after endangering himself and others due to his auditory/visual hallucinations.  He has a hard time letting the past go, and continues to ruminate about things that he cannot go back and change.  He stated he has a hard time staying on his medications, but knows that he does not struggle with ruminations about the past as much when he is on his medications.  Type of Therapy:  Group Therapy - Process  Participation Level:  Active  Participation Quality:  Attentive and Sharing  Affect:  Blunted and Depressed  Cognitive:  Appropriate and Oriented  Insight:  Developing/Improving  Engagement in Therapy:  Developing/Improving  Modes of Intervention:  Clarification, Education, Exploration, Discussion  Ambrose Mantle, LCSW 03/10/2013, 1:13 PM

## 2013-03-10 NOTE — Progress Notes (Signed)
Patient denies SI, HI. AVH present during day. Patient reports having "a wonderful day". Patient plans to stay on his ordered medications and follow his treatment plan upon discharge to prevent relapse. Patient active among peers and in group.  Encouragement offered. Patient received evening medications and prn ocean nasal spray.  Patient c/o swelling in left foot. Both ankles appear to have minor swelling present. Foot of bed raised.  Patient resting quietly, safety maintained; Q 15 checks continue.

## 2013-03-10 NOTE — Progress Notes (Signed)
Patient ID: Dale [name], male   DOB: 1965-05-10, 47 y.o.   MRN: [id number] Psychoeducational Group Note  Date:  03/10/2013 Time:0930am  Group Topic/Focus:  Identifying Needs:   The focus of this group is to help patients identify their personal needs that have been historically problematic and identify healthy behaviors to address their needs.  Participation Level:  Minimal  Participation Quality:  Inattentive  Affect:  Irritable  Cognitive:  Lacking  Insight:  Resistant  Engagement in Group:  Resistant  Additional Comments:  Inventory and Psychoeducational group   Valente David 03/10/2013,10:31 AM

## 2013-03-10 NOTE — Progress Notes (Signed)
Pt c/o headache/discomfort, and waking up during the night with sweats. Given tylenol. Safety maintained, Q 15 checks continue.

## 2013-03-10 NOTE — Progress Notes (Signed)
[facility] Group Notes:  (Nursing/MHT/Case Management/Adjunct)  Date:  03/10/2013  Time:  8:00 p.m.   Type of Therapy:  Psychoeducational Skills  Participation Level:  Active  Participation Quality:  Appropriate  Affect:  Excited  Cognitive:  Appropriate  Insight:  Improving  Engagement in Group:  Improving  Modes of Intervention:  Education  Summary of Progress/Problems: The patient stated in group that he had a "rough day". He explained that his legs were swollen for much of the day and were causing quite a bit of discomfort. He also mentioned that he had a good visit with his girlfriend and godson. In terms of the theme of the day, he intends to listen to the medical provider's advice and to follow their lead rather than being stubborn and eventually acting out. He along with a few of his peers expressed his feelings regarding a patient that had acted out near shift change.   Khiree Bukhari S 03/10/2013, 11:10 PM

## 2013-03-11 LAB — PROTIME-INR: INR: 2.27 — ABNORMAL HIGH (ref 0.00–1.49)

## 2013-03-11 MED ORDER — TRAZODONE HCL 50 MG PO TABS
50.0000 mg | ORAL_TABLET | Freq: Every day | ORAL | Status: DC
  Filled 2013-03-11 (×2): qty 1

## 2013-03-11 MED ORDER — WARFARIN SODIUM 7.5 MG PO TABS
7.5000 mg | ORAL_TABLET | Freq: Once | ORAL | Status: AC
  Administered 2013-03-11: 7.5 mg via ORAL
  Filled 2013-03-11: qty 1

## 2013-03-11 MED ORDER — CARBAMAZEPINE ER 200 MG PO TB12
200.0000 mg | ORAL_TABLET | Freq: Every day | ORAL | Status: DC
  Administered 2013-03-11: 200 mg via ORAL
  Filled 2013-03-11 (×2): qty 1

## 2013-03-11 NOTE — [facility] Group Notes (Signed)
[facility] Group Notes:  (Nursing/MHT/Case Management/Adjunct)  Date:  03/11/2013  Time:  10:36 AM  Type of Therapy:  Psychoeducational Skills  Participation Level:  Minimal  Participation Quality:  Appropriate  Affect:  Appropriate  Cognitive:  Alert  Insight:  Appropriate  Engagement in Group:  Limited  Modes of Intervention:  Discussion  Summary of Progress/Problems:Pt did attend the group but did not talk at all.   Dale [name] [facility] 03/11/2013, 10:36 AM

## 2013-03-11 NOTE — Progress Notes (Signed)
ANTICOAGULATION CONSULT NOTE - Follow Up Consult  Pharmacy Consult for Coumadin Indication: DVT  Allergies  Allergen Reactions  . Ibuprofen Hives    Patient Measurements: Height: 5\' 10"  (177.8 cm) Weight: 194 lb 0.1 oz (88 kg) IBW/kg (Calculated) : 73 Heparin Dosing Weight:   Vital Signs: Temp: 97.5 F (36.4 C) (12/07 0700) BP: 97/64 mmHg (12/07 0701) Pulse Rate: 105 (12/07 0701)  Labs:  Recent Labs  03/09/13 0610 03/10/13 0635 03/11/13 0642  LABPROT 22.6* 24.1* 24.3*  INR 2.06* 2.24* 2.27*    Estimated Creatinine Clearance: 68.3 ml/min (by C-G formula based on Cr of 1.51).   Medications:  Scheduled:  . carbamazepine  200 mg Oral BID  . cephALEXin  500 mg Oral Q6H  . clotrimazole   Topical BID  . famotidine  40 mg Oral QHS  . feeding supplement (ENSURE COMPLETE)  237 mL Oral BID BM  . gabapentin  400 mg Oral TID  . hydrochlorothiazide  25 mg Oral Daily  . hydrOXYzine  25 mg Oral TID  . multivitamin with minerals  1 tablet Oral Daily  . nicotine  21 mg Transdermal Daily  . prazosin  5 mg Oral QHS  . risperiDONE  3 mg Oral QHS  . sertraline  100 mg Oral Daily  . traMADol  50 mg Oral TID  . warfarin  7.5 mg Oral Once  . Warfarin - Pharmacist Dosing Inpatient   Does not apply q1800    Assessment: Patient INR rose slightly to 2.27 after a 7.5 mg Coumadin dose  Patient was stated on carbamazepine which may reduce the anticoagulation of Coumadin.  Need to monitor and may need to increase home dose of Coumadin if continues of carbamazepine.  Goal of Therapy:  INR 2-3    Plan:  Will give Coumadin 7.5 mg this AM and recheck INR in AM.  Pamala Duffel L 03/11/2013,8:41 AM
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
## 2013-03-11 NOTE — [facility] Group Notes (Signed)
[facility] Group Notes:  (Clinical Social Work)  03/11/2013   11:15am-12:00pm  Summary of Progress/Problems:  The main focus of today's process group was to listen to a variety of genres of music and to identify that different types of music provoke different responses.  The patient then was able to identify personally what was soothing for them, as well as energizing.  Handouts were used to record feelings evoked, as well as how patient can personally use this knowledge in sleep habits, with depression, and with other symptoms.  The patient expressed understanding of concepts, as well as knowledge of how each type of music affected them and how this can be used when they are at home as a tool in their recovery.  Dale [name] enjoyed the group and participated to the fullest.  Type of Therapy:  Music Therapy   Participation Level:  Active  Participation Quality:  Attentive and Sharing  Affect:  Blunted  Cognitive:  Oriented  Insight:  Engaged  Engagement in Therapy:  Engaged  Modes of Intervention:   Activity, Exploration  Ambrose Mantle, LCSW 03/11/2013, 12:30pm

## 2013-03-11 NOTE — Progress Notes (Signed)
D. Pt has been up and has been visible in milieu today, attending and interacting in various activities. Pt spoke about how he had a black-out today and is also endorsing auditory hallucinations. Pt did not want trazodone for sleep and instead was talking about how he has taken benadryl in the past and spoke about talking to the doctor in the morning in hopes of being able to take benadryl at bedtime. Pt has complained of pain and has also mentioned feeling irritated at other patients in the milieu and worked on ignoring the patient who was bothering him. A. Support and encouragement provided, medication education given. R. Pt verbalized understanding, safety maintained.
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
## 2013-03-12 MED ORDER — HYDROXYZINE HCL 25 MG PO TABS: 25.0000 mg | ORAL_TABLET | Freq: Three times a day (TID) | ORAL | Status: DC | PRN

## 2013-03-12 MED ORDER — WARFARIN SODIUM 10 MG PO TABS
10.0000 mg | ORAL_TABLET | Freq: Once | ORAL | Status: AC
  Administered 2013-03-12: 10 mg via ORAL
  Filled 2013-03-12: qty 1

## 2013-03-12 MED ORDER — TRAZODONE HCL 150 MG PO TABS
150.0000 mg | ORAL_TABLET | Freq: Every day | ORAL | Status: DC
  Administered 2013-03-12 – 2013-03-13 (×2): 150 mg via ORAL
  Filled 2013-03-12 (×3): qty 1

## 2013-03-12 MED ORDER — SERTRALINE HCL 50 MG PO TABS
150.0000 mg | ORAL_TABLET | Freq: Every day | ORAL | Status: DC
  Administered 2013-03-13 – 2013-03-14 (×2): 150 mg via ORAL
  Filled 2013-03-12 (×3): qty 3

## 2013-03-12 MED ORDER — GABAPENTIN 300 MG PO CAPS
600.0000 mg | ORAL_CAPSULE | Freq: Three times a day (TID) | ORAL | Status: DC
Start: 2013-03-12 — End: 2013-03-14
  Administered 2013-03-12 – 2013-03-14 (×6): 600 mg via ORAL
  Filled 2013-03-12 (×11): qty 2

## 2013-03-12 NOTE — Progress Notes (Signed)
Seen and agreed. Aaiden Depoy, MD 
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
## 2013-03-12 NOTE — Progress Notes (Signed)
D   Pt is logical and coherent in his thinking   He does not appear to be responding to internal stimuli although he does endorse voices  He interacts well with others and attends and participates in groups  Pt said his leg was almost healed and the rash on his body was relieved with the cream he has received A   Verbal support given   Medications administered and effectiveness monitored   Q 15 min checks R   Pt safe at present

## 2013-03-12 NOTE — Progress Notes (Signed)
ANTICOAGULATION CONSULT NOTE - Follow Up Consult  Pharmacy Consult forCoumadin Indication: h/o clots  Allergies  Allergen Reactions  . Ibuprofen Hives    Patient Measurements: Height: 5\' 10"  (177.8 cm) Weight: 194 lb 0.1 oz (88 kg) IBW/kg (Calculated) : 73    Labs:  Recent Labs  03/10/13 0635 03/11/13 0642 03/12/13 0626  LABPROT 24.1* 24.3* 23.0*  INR 2.24* 2.27* 2.11*    Estimated Creatinine Clearance: 68.3 ml/min (by C-G formula based on Cr of 1.51).   Medications:  Scheduled:  . carbamazepine  200 mg Oral QHS  . cephALEXin  500 mg Oral Q6H  . clotrimazole   Topical BID  . famotidine  40 mg Oral QHS  . feeding supplement (ENSURE COMPLETE)  237 mL Oral BID BM  . gabapentin  400 mg Oral TID  . hydrochlorothiazide  25 mg Oral Daily  . hydrOXYzine  25 mg Oral TID  . multivitamin with minerals  1 tablet Oral Daily  . nicotine  21 mg Transdermal Daily  . prazosin  5 mg Oral QHS  . risperiDONE  3 mg Oral QHS  . sertraline  100 mg Oral Daily  . traMADol  50 mg Oral TID  . traZODone  50 mg Oral QHS  . warfarin  10 mg Oral Once  . Warfarin - Pharmacist Dosing Inpatient   Does not apply q1800    Assessment: INR at low end of goal.  INR decreased from 12/7  Goal of Therapy:  INR 2-3    Plan:  Coumadin 10 mg x 1 today.  PT/INR in am    Charyl Dancer 03/12/2013,8:12 AM

## 2013-03-12 NOTE — [facility] Group Notes (Signed)
[facility] LCSW Aftercare Discharge Planning Group Note   03/12/2013 11:28 AM  Participation Quality:  Engaged  Mood/Affect:  Depressed and Flat  Depression Rating:  7  Anxiety Rating:  8  Thoughts of Suicide:  No Will you contract for safety?   NA  Current AVH:  Yes  Plan for Discharge/Comments:  C/O many symptoms; lack of sleep, needing to get off of tegretol as it affects his cumadin, blackouts and hearing voices.  Appears to be trying to extend stay for as long as possible.    Transportation Means: family  Supports: family  [place], [name]

## 2013-03-12 NOTE — [facility] Group Notes (Signed)
[facility] LCSW Group Therapy  03/12/2013 1:15 pm  Type of Therapy: Process Group Therapy  Participation Level:  Active  Participation Quality:  Appropriate  Affect:  Flat  Cognitive:  Oriented  Insight:  Limited  Engagement in Group:  Limited  Engagement in Therapy:  Limited  Modes of Intervention:  Activity, Clarification, Education, Problem-solving and Support  Summary of Progress/Problems: Today's group addressed the issue of overcoming obstacles.  Patients were asked to identify their biggest obstacle post d/c that stands in the way of their on-going success, and then problem solve as to how to manage this.  Dale [name] is singularly focused on how is not getting the help he needs since he was told by the Dr that he would be discharging this week.  "All my progress here was for nothing.  Everything is thrown away."  Very fatalistic in his thinking.  Others talked about building what was already gained here, and moving forward with the resources he is linked to, but he summarily rejected all such feedback.  "Y'all just don't know what it is like for me.  I'm too stressed to be able to think clearly. [facility]  [facility] and now that is being ripped away from me."  [name] 03/12/2013   3:35 PM

## 2013-03-12 NOTE — [facility] Suicide Risk Assessment (Signed)
[facility] INPATIENT:  Family/Significant Other Suicide Prevention Education  Suicide Prevention Education:  Education Completed; Mr Brue, father with whom pt will be staying, [phone] has been identified by the patient as the family member/significant other with whom the patient will be residing, and identified as the person(s) who will aid the patient in the event of a mental health crisis (suicidal ideations/suicide attempt).  With written consent from the patient, the family member/significant other has been provided the following suicide prevention education, prior to the and/or following the discharge of the patient.  The suicide prevention education provided includes the following:  Suicide risk factors  Suicide prevention and interventions  National Suicide Hotline telephone number  [facility] assessment telephone number  [facility] Emergency Assistance 911  [facility] and/or Residential Mobile Crisis Unit telephone number  Request made of family/significant other to:  Remove weapons (e.g., guns, rifles, knives), all items previously/currently identified as safety concern.    Remove drugs/medications (over-the-counter, prescriptions, illicit drugs), all items previously/currently identified as a safety concern.  The family member/significant other verbalizes understanding of the suicide prevention education information provided.  The family member/significant other agrees to remove the items of safety concern listed above.  [name] B 03/12/2013, 3:21 PM

## 2013-03-12 NOTE — Progress Notes (Signed)
Recreation Therapy Notes   Date: 12.08.2014 Time: 9:30am Location: [address] Dayroom  Group Topic: Wellness  Goal Area(s) Addresses:  Patient will define components of whole wellness. Patient will verbalize benefit to self of whole wellness.  Behavioral Response: Did not attend.    Munir Victorian L Niana Martorana, LRT/CTRS  Kristol Almanzar L 03/12/2013 12:36 PM 

## 2013-03-12 NOTE — Progress Notes (Signed)
Adult Psychoeducational Group Note  Date:  03/12/2013 Time:  11:26 PM  Group Topic/Focus:  Wrap-Up Group:   The focus of this group is to help patients review their daily goal of treatment and discuss progress on daily workbooks.  Participation Level:  Active  Participation Quality:  Appropriate  Affect:  Appropriate  Cognitive:  Appropriate  Insight: Limited  Engagement in Group:  Engaged and Supportive  Modes of Intervention:  Support  Additional Comments:  Patient attended and participated in group tonight. He reports having a good day. He went to groups, went for his meals and took a nap. He will be leaving tomorrow. For his wellness her eat, shower and take a walk every day.  Lita Mains [facility] 03/12/2013, 11:26 PM

## 2013-03-12 NOTE — Progress Notes (Signed)
D: Pt reports intermittent hallucinations but denies experiencing any this morning during shift assessment. Pt reports that he continues to have blackout episodes. No blackouts have been witness by Clinical research associate nor reported by other staff members. Pt c/o not well sleeping at night-time. Pt requested to be taken off of tegretol d/t him thinking that it is interfering with him taking coumadin. Pt c/o dry mouth. A: Medications administered as ordered per MD. Verbal support given. Pt encouraged to attend groups. 15 minute checks performed for safety. R: Pt needy throughout the day. Somatic at times throughout the day. No distress noted by Clinical research associate. Pt do not appear to be responding to internal stimuli. Pt safety  Maintained.

## 2013-03-13 ENCOUNTER — Other Ambulatory Visit: Payer: No Typology Code available for payment source

## 2013-03-13 ENCOUNTER — Ambulatory Visit: Payer: No Typology Code available for payment source | Admitting: Internal Medicine

## 2013-03-13 MED ORDER — WARFARIN SODIUM 2.5 MG PO TABS
12.5000 mg | ORAL_TABLET | Freq: Once | ORAL | Status: AC
  Administered 2013-03-13: 10:00:00 12.5 mg via ORAL
  Filled 2013-03-13: qty 1

## 2013-03-13 NOTE — Progress Notes (Signed)
ANTICOAGULATION CONSULT NOTE - Follow Up Consult  Pharmacy Consult for Coumadin Indication:h/o clots  Allergies  Allergen Reactions  . Ibuprofen Hives    Patient Measurements: Height: 5\' 10"  (177.8 cm) Weight: 194 lb 0.1 oz (88 kg) IBW/kg (Calculated) : 73    Labs:  Recent Labs  03/11/13 0642 03/12/13 0626 03/13/13 0615  LABPROT 24.3* 23.0* 21.2*  INR 2.27* 2.11* 1.90*    Estimated Creatinine Clearance: 68.3 ml/min (by C-G formula based on Cr of 1.51).   Medications:  Scheduled:  . cephALEXin  500 mg Oral Q6H  . clotrimazole   Topical BID  . famotidine  40 mg Oral QHS  . feeding supplement (ENSURE COMPLETE)  237 mL Oral BID BM  . gabapentin  600 mg Oral TID  . hydrochlorothiazide  25 mg Oral Daily  . multivitamin with minerals  1 tablet Oral Daily  . nicotine  21 mg Transdermal Daily  . prazosin  5 mg Oral QHS  . risperiDONE  3 mg Oral QHS  . sertraline  150 mg Oral Daily  . traMADol  50 mg Oral TID  . traZODone  150 mg Oral QHS  . Warfarin - Pharmacist Dosing Inpatient   Does not apply q1800    Assessment: INR fell below goal this AM at 1.9 Goal of Therapy:  INR 2-3    Plan:  Coumadin 12.5 mg x 1 today  PT/INR in AM   Dale [name] 03/13/2013,7:43 AM

## 2013-03-13 NOTE — Tx Team (Signed)
  Interdisciplinary Treatment Plan Update   Date Reviewed:  03/13/2013  Time Reviewed:  3:29 PM  Progress in Treatment:   Attending groups: Yes Participating in groups: Yes Taking medication as prescribed: Yes  Tolerating medication: Yes Family/Significant other contact made: Yes  Patient understands diagnosis: Yes  Discussing patient identified problems/goals with staff: Yes Medical problems stabilized or resolved: Yes Denies suicidal/homicidal ideation: Yes Patient has not harmed self or others: Yes  For review of initial/current patient goals, please see plan of care.  Estimated Length of Stay:  Likely d/c tomorrow  Reason for Continuation of Hospitalization:   New Problems/Goals identified:  N/A  Discharge Plan or Barriers:   Return to his parents' home, follow up outpt  Additional Comments:  Attendees:  Signature: Thedore Mins, MD 03/13/2013 3:29 PM   Signature: Richelle Ito, LCSW 03/13/2013 3:29 PM  Signature: Fransisca Kaufmann, NP 03/13/2013 3:29 PM  Signature: Joslyn Devon, RN 03/13/2013 3:29 PM  Signature: Liborio Nixon, RN 03/13/2013 3:29 PM  Signature:  03/13/2013 3:29 PM  Signature:   03/13/2013 3:29 PM  Signature:    Signature:    Signature:    Signature:    Signature:    Signature:      Scribe for Treatment Team:   Richelle Ito, LCSW  03/13/2013 3:29 PM
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
## 2013-03-13 NOTE — Progress Notes (Signed)
Adult Psychoeducational Group Note  Date:  03/13/2013 Time:  9:14 PM  Group Topic/Focus:  Wrap-Up Group:   The focus of this group is to help patients review their daily goal of treatment and discuss progress on daily workbooks.  Participation Level:  Active  Participation Quality:  Appropriate  Affect:  Appropriate  Cognitive:  Alert  Insight: Appropriate  Engagement in Group:  Engaged  Modes of Intervention:  Discussion  Additional Comments:  Patient stated that he had a good day. Patient stated that one of his goals and a way to stay positive is to not allow himself to get out of control. Pt. Discharges tomorrow.   [facility] 03/13/2013, 9:14 PM

## 2013-03-13 NOTE — [facility] Group Notes (Signed)
[facility] LCSW Group Therapy  03/13/2013 , 12:28 PM   Type of Therapy:  Group Therapy  Participation Level:  Active  Participation Quality:  Attentive  Affect:  Appropriate  Cognitive:  Alert  Insight:  Improving  Engagement in Therapy:  Engaged  Modes of Intervention:  Discussion, Exploration and Socialization  Summary of Progress/Problems: Today's group focused on the term Diagnosis.  Participants were asked to define the term, and then pronounce whether it is a negative, positive or neutral term.  Per usual, Antoni was very engaged in group.  He enjoyed the discussion about diagnosis, and embraced his diagnosis of  Depression and PTSD while giving others details about his accompanying symptoms.  The conversation shifted to communities, and Caro, despite initial expresssed reluctance, willingly informed us about the community that exists within prison, and how he adapted to that.  He was able to generalize that to the hospital community, and talked about how it is helpful while here to ignore or avoid others in order to get along without conflict in the community.  [name] B 03/13/2013 , 12:28 PM

## 2013-03-13 NOTE — Progress Notes (Signed)
D: Pt presents anxious this morning. Pt denies SI/HI/AVH today. Pt reported on his self inventory sheet, depression 6/10 and hopelessness 5/10. Pt continues to use a wheelchair d/t right leg pain.pt continues to report having blackouts. No blackouts witness by Clinical research associate. Per pt, he's going to go live with his father once he is discharged from [facility].  Pt plan to take his meds in order to better care for himself.  Pt compliant with taking meds and attending groups. A:  Medications administered as ordered per MD. Verbal support given. Pt encouraged to attend groups. 15 minute checks performed for safety. R: pt safety maintained.

## 2013-03-14 LAB — PROTIME-INR
INR: 2.18 — ABNORMAL HIGH (ref 0.00–1.49)
Prothrombin Time: 23.6 s — ABNORMAL HIGH (ref 11.6–15.2)

## 2013-03-14 MED ORDER — SERTRALINE HCL 50 MG PO TABS: 150.0000 mg | ORAL_TABLET | Freq: Every day | ORAL | Status: DC

## 2013-03-14 MED ORDER — PRAZOSIN HCL 5 MG PO CAPS: 5.0000 mg | ORAL_CAPSULE | Freq: Every day | ORAL | Status: DC

## 2013-03-14 MED ORDER — CLOTRIMAZOLE-BETAMETHASONE 1-0.05 % EX CREA: 1.0000 "application " | TOPICAL_CREAM | Freq: Two times a day (BID) | CUTANEOUS | Status: DC

## 2013-03-14 MED ORDER — GABAPENTIN 600 MG PO TABS
600.0000 mg | ORAL_TABLET | Freq: Three times a day (TID) | ORAL | Status: DC
  Filled 2013-03-14 (×3): qty 42

## 2013-03-14 MED ORDER — HYDROCHLOROTHIAZIDE 25 MG PO TABS: 25.0000 mg | ORAL_TABLET | Freq: Every day | ORAL | Status: DC

## 2013-03-14 MED ORDER — RANITIDINE HCL 300 MG PO TABS: 300.0000 mg | ORAL_TABLET | Freq: Every day | ORAL | Status: DC

## 2013-03-14 MED ORDER — TRAMADOL HCL 50 MG PO TABS: 50.0000 mg | ORAL_TABLET | Freq: Four times a day (QID) | ORAL | Status: DC | PRN

## 2013-03-14 MED ORDER — WARFARIN SODIUM 2.5 MG PO TABS
12.5000 mg | ORAL_TABLET | Freq: Once | ORAL | Status: AC
  Administered 2013-03-14: 11:00:00 12.5 mg via ORAL
  Filled 2013-03-14: qty 1

## 2013-03-14 MED ORDER — MULTI-VITAMIN/MINERALS PO TABS: 1.0000 | ORAL_TABLET | Freq: Every day | ORAL | Status: DC

## 2013-03-14 MED ORDER — TRAZODONE HCL 150 MG PO TABS: 150.0000 mg | ORAL_TABLET | Freq: Every day | ORAL | Status: DC

## 2013-03-14 MED ORDER — WARFARIN SODIUM 7.5 MG PO TABS: 7.5000 mg | ORAL_TABLET | Freq: Every day | ORAL | Status: DC

## 2013-03-14 MED ORDER — GABAPENTIN 300 MG PO CAPS: 600.0000 mg | ORAL_CAPSULE | Freq: Three times a day (TID) | ORAL | Status: DC

## 2013-03-14 MED ORDER — RISPERIDONE 1 MG PO TBDP
3.0000 mg | ORAL_TABLET | Freq: Every day | ORAL | Status: DC
  Filled 2013-03-14: qty 42

## 2013-03-14 MED ORDER — RISPERIDONE 3 MG PO TBDP: 3.0000 mg | ORAL_TABLET | Freq: Every day | ORAL | Status: DC
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
## 2013-03-14 NOTE — Progress Notes (Addendum)
ANTICOAGULATION CONSULT NOTE - Follow Up Consult  Pharmacy Consult for coumadin  Indication: h/o clots  Allergies  Allergen Reactions  . Ibuprofen Hives    Patient Measurements: Height: 5\' 10"  (177.8 cm) Weight: 194 lb 0.1 oz (88 kg) IBW/kg (Calculated) : 73   Labs:  Recent Labs  03/12/13 0626 03/13/13 0615 03/14/13 0636  LABPROT 23.0* 21.2* 23.6*  INR 2.11* 1.90* 2.18*    Estimated Creatinine Clearance: 68.3 ml/min (by C-G formula based on Cr of 1.51).   Medications:  Scheduled:  . cephALEXin  500 mg Oral Q6H  . clotrimazole   Topical BID  . famotidine  40 mg Oral QHS  . feeding supplement (ENSURE COMPLETE)  237 mL Oral BID BM  . gabapentin  600 mg Oral TID  . hydrochlorothiazide  25 mg Oral Daily  . multivitamin with minerals  1 tablet Oral Daily  . nicotine  21 mg Transdermal Daily  . prazosin  5 mg Oral QHS  . risperiDONE  3 mg Oral QHS  . sertraline  150 mg Oral Daily  . traMADol  50 mg Oral TID  . traZODone  150 mg Oral QHS  . warfarin  12.5 mg Oral Once  . Warfarin - Pharmacist Dosing Inpatient   Does not apply q1800    Assessment: INR at goal increased dose due to addition of carbamazepine Goal of Therapy:  INR 2-3     Plan:  Coumadin 12.5 mg x 1 today.  PT/INR in am  Carbamazepine discontinued and patient being discharged today.  Will suggest resuming  home dose of 7.5 mg daily for patient on discharge as he was therapeutic on this dose.  Patient stated he has appt on Tuesday 12/16, suggested to try to get earlier appt if possible due to our changes and adjustments.      Peggye Fothergill Marie 03/14/2013,8:31 AM

## 2013-03-14 NOTE — Progress Notes (Signed)
Recreation Therapy Notes  Date: 12.10.2014 Time: 9:30am Location: [address] Dayroom  Group Topic: Leisure Education  Goal Area(s) Addresses:  Patient will identify positive leisure activities.  Patient will identify one positive benefit of participation in leisure activities.   Behavioral Response: Engaged  Intervention: Game  Activity: Leisure ABC's.   Education:  Leisure Education, Pharmacologist, Building control surveyor.   Education Outcome: Acknowledges understanding  Clinical Observations/Feedback: Patient actively engaged in group activity identifying positive leisure activities to correspond with the letters of the alphabet. Patient contributed to group discussion identifying a positive benefit of leisure participation.   [name] Kea Callan, LRT/CTRS  Tyrelle Raczka L 03/14/2013 1:57 PM

## 2013-03-14 NOTE — Progress Notes (Signed)
D   Pt was observed standing and moving around with ease  He was even playing basketball in the gym but then gets back into the wheelchair  He is polite and cooperative   He denies suicidal and homicidal ideeation   His discharge is scheduled for today A   Verbal support given   Educated on medications and administered same   Q 15 min checks  R   Pt safe at present
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
## 2013-03-14 NOTE — Progress Notes (Signed)
Discharge note: Pt received both written and verbal discharge instructions. Pt agreed to f/u appt and med regimen. Pt denies SI/HI/AVH at time of discharge. Pt received all belongings from room and locker. Pt received sample meds, prescriptions and his cane. Per pt, he is missing his ID. Writer notified psych ED to see if pt ID was left there, according to the nurse, there was no ID left behind by pt. Writer also checked ED notes and there was no ID listed along with the pt other belongings brought in. Writer informed pt to call [employer] to see if it was left during transport to [facility]. Pt safely left [facility] with his father.

## 2013-03-14 NOTE — Progress Notes (Signed)
[facility] Adult Case Management Discharge Plan :  Will you be returning to the same living situation after discharge: No.Going to stay with father and step mother At discharge, do you have transportation home?:Yes,  father Do you have the ability to pay for your medications:Yes,  mental health  Release of information consent forms completed and in the chart;  Patient's signature needed at discharge.  Patient to Follow up at: Follow-up Information   Schedule an appointment as soon as possible for a visit with [name], MD.   Specialty:  Internal Medicine   Contact information:   [name] [place] [address] [phone]       Follow up with [facility]. (Go to the walk-in clinic M-F between 8 and 9AM for your hospital follow up appointment)    Contact information:   [address]  [place]  [336] [phone]      Patient denies SI/HI:   Yes,  yes    Safety Planning and Suicide Prevention discussed:  Yes,  yes  [name] B 03/14/2013, 11:00 AM

## 2013-03-15 NOTE — Progress Notes (Signed)
Seen and agreed. Brycin Kille, MD 

## 2013-03-15 NOTE — Discharge Summary (Signed)
Seen and agreed. Gregorey Nabor, MD 

## 2013-03-19 NOTE — Progress Notes (Signed)
Patient Discharge Instructions:  After Visit Summary (AVS):   Faxed to:  03/19/13 Discharge Summary Note:   Faxed to:  03/19/13 Psychiatric Admission Assessment Note:   Faxed to:  03/19/13 Suicide Risk Assessment - Discharge Assessment:   Faxed to:  03/19/13 Faxed/Sent to the Next [facility] Care provider:  03/19/13 Faxed to [facility] @ [phone]  Jerelene Redden, 03/19/2013, 4:03 PM

## 2013-03-20 ENCOUNTER — Encounter: Payer: Self-pay | Admitting: Internal Medicine

## 2013-03-20 ENCOUNTER — Ambulatory Visit: Payer: No Typology Code available for payment source | Attending: Internal Medicine | Admitting: Internal Medicine

## 2013-03-20 ENCOUNTER — Ambulatory Visit (HOSPITAL_BASED_OUTPATIENT_CLINIC_OR_DEPARTMENT_OTHER): Payer: No Typology Code available for payment source

## 2013-03-20 VITALS — BP 116/82 | HR 66 | Temp 97.9°F | Resp 17 | Ht 71.0 in | Wt 195.0 lb

## 2013-03-20 DIAGNOSIS — I2699 Other pulmonary embolism without acute cor pulmonale: Secondary | ICD-10-CM

## 2013-03-20 DIAGNOSIS — M79671 Pain in right foot: Secondary | ICD-10-CM

## 2013-03-20 DIAGNOSIS — I82403 Acute embolism and thrombosis of unspecified deep veins of lower extremity, bilateral: Secondary | ICD-10-CM

## 2013-03-20 DIAGNOSIS — M79609 Pain in unspecified limb: Secondary | ICD-10-CM

## 2013-03-20 DIAGNOSIS — I8290 Acute embolism and thrombosis of unspecified vein: Secondary | ICD-10-CM

## 2013-03-20 DIAGNOSIS — H539 Unspecified visual disturbance: Secondary | ICD-10-CM

## 2013-03-20 DIAGNOSIS — I749 Embolism and thrombosis of unspecified artery: Secondary | ICD-10-CM

## 2013-03-20 DIAGNOSIS — I82409 Acute embolism and thrombosis of unspecified deep veins of unspecified lower extremity: Secondary | ICD-10-CM

## 2013-03-20 LAB — POCT INR
INR: 2.5
INR: 2.5

## 2013-03-20 NOTE — Progress Notes (Unsigned)
Pt here for repeat INR Taking coumadin therapy 7.5 mg

## 2013-03-20 NOTE — Progress Notes (Signed)
Patient here for follow up  INR Complains of callus to right foot that has been bothering him Having some vision problems Would like referral to podiatry and eye dr
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
## 2013-04-17 ENCOUNTER — Encounter: Payer: Self-pay | Admitting: Internal Medicine

## 2013-04-17 ENCOUNTER — Ambulatory Visit: Payer: No Typology Code available for payment source | Attending: Internal Medicine | Admitting: Internal Medicine

## 2013-04-17 VITALS — BP 128/79 | HR 91 | Temp 98.9°F | Resp 14 | Ht 71.0 in | Wt 219.0 lb

## 2013-04-17 DIAGNOSIS — Z86718 Personal history of other venous thrombosis and embolism: Secondary | ICD-10-CM | POA: Insufficient documentation

## 2013-04-17 DIAGNOSIS — Z86711 Personal history of pulmonary embolism: Secondary | ICD-10-CM | POA: Insufficient documentation

## 2013-04-17 DIAGNOSIS — I10 Essential (primary) hypertension: Secondary | ICD-10-CM | POA: Insufficient documentation

## 2013-04-17 LAB — CBC WITH DIFFERENTIAL/PLATELET
Basophils Absolute: 0 10*3/uL (ref 0.0–0.1)
Basophils Relative: 0 % (ref 0–1)
EOS ABS: 0.2 10*3/uL (ref 0.0–0.7)
Eosinophils Relative: 4 % (ref 0–5)
HCT: 44.4 % (ref 39.0–52.0)
HEMOGLOBIN: 15.2 g/dL (ref 13.0–17.0)
LYMPHS ABS: 1.3 10*3/uL (ref 0.7–4.0)
LYMPHS PCT: 26 % (ref 12–46)
MCH: 30.2 pg (ref 26.0–34.0)
MCHC: 34.2 g/dL (ref 30.0–36.0)
MCV: 88.1 fL (ref 78.0–100.0)
MONOS PCT: 9 % (ref 3–12)
Monocytes Absolute: 0.4 10*3/uL (ref 0.1–1.0)
NEUTROS PCT: 61 % (ref 43–77)
Neutro Abs: 3.1 10*3/uL (ref 1.7–7.7)
Platelets: 189 10*3/uL (ref 150–400)
RBC: 5.04 MIL/uL (ref 4.22–5.81)
RDW: 14.3 % (ref 11.5–15.5)
WBC: 5 10*3/uL (ref 4.0–10.5)

## 2013-04-17 LAB — POCT INR: INR: 3

## 2013-04-17 MED ORDER — WARFARIN SODIUM 7.5 MG PO TABS: 7.5000 mg | ORAL_TABLET | Freq: Every day | ORAL | Status: DC
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
## 2013-04-17 NOTE — Addendum Note (Signed)
Addended by: Susie Cassette MD, Germain Osgood on: 04/17/2013 09:26 AM   Modules accepted: Orders

## 2013-04-17 NOTE — Progress Notes (Signed)
Pt is here for a f/u from last visit. No complaints from pt.

## 2013-04-23 ENCOUNTER — Telehealth: Payer: Self-pay | Admitting: *Deleted

## 2013-04-23 NOTE — Telephone Encounter (Signed)
Message copied by Audley Hinojos, [place] R on Mon Apr 23, 2013 10:27 AM ------      Message from: Susie Cassette MD, [facility]      Created: Fri Apr 20, 2013 12:31 PM       Notify patient that labs are normal. INR is 3.0. He should repeat INR in 2 weeks and come back to the clinic for a followup INR ------

## 2013-05-04 ENCOUNTER — Telehealth: Payer: Self-pay | Admitting: Internal Medicine

## 2013-05-04 NOTE — Telephone Encounter (Signed)
Nurse case manager calling to confirm whether pt has been continuing with appointments and care at clinic. Please f/u with nurse.

## 2013-05-04 NOTE — Telephone Encounter (Signed)
Left message with pt case manager Wilkie AyeKristy.

## 2013-05-08 ENCOUNTER — Emergency Department (HOSPITAL_COMMUNITY)
Admission: EM | Admit: 2013-05-08 | Discharge: 2013-05-09 | Disposition: A | Discharge status: Other Acute Inpatient Hospital | Payer: Self-pay | Attending: Emergency Medicine | Admitting: Emergency Medicine

## 2013-05-08 DIAGNOSIS — F39 Unspecified mood [affective] disorder: Secondary | ICD-10-CM

## 2013-05-08 DIAGNOSIS — L509 Urticaria, unspecified: Secondary | ICD-10-CM

## 2013-05-08 DIAGNOSIS — N508 Other specified disorders of male genital organs: Secondary | ICD-10-CM

## 2013-05-08 DIAGNOSIS — I10 Essential (primary) hypertension: Secondary | ICD-10-CM

## 2013-05-08 DIAGNOSIS — K648 Other hemorrhoids: Secondary | ICD-10-CM

## 2013-05-08 DIAGNOSIS — F411 Generalized anxiety disorder: Secondary | ICD-10-CM

## 2013-05-08 DIAGNOSIS — Z87891 Personal history of nicotine dependence: Secondary | ICD-10-CM | POA: Insufficient documentation

## 2013-05-08 DIAGNOSIS — I2699 Other pulmonary embolism without acute cor pulmonale: Secondary | ICD-10-CM

## 2013-05-08 DIAGNOSIS — K6289 Other specified diseases of anus and rectum: Secondary | ICD-10-CM

## 2013-05-08 DIAGNOSIS — Z79899 Other long term (current) drug therapy: Secondary | ICD-10-CM | POA: Insufficient documentation

## 2013-05-08 DIAGNOSIS — F4310 Post-traumatic stress disorder, unspecified: Secondary | ICD-10-CM

## 2013-05-08 DIAGNOSIS — Z8619 Personal history of other infectious and parasitic diseases: Secondary | ICD-10-CM | POA: Insufficient documentation

## 2013-05-08 DIAGNOSIS — Z87828 Personal history of other (healed) physical injury and trauma: Secondary | ICD-10-CM | POA: Insufficient documentation

## 2013-05-08 DIAGNOSIS — F3289 Other specified depressive episodes: Secondary | ICD-10-CM

## 2013-05-08 DIAGNOSIS — Z7901 Long term (current) use of anticoagulants: Secondary | ICD-10-CM | POA: Insufficient documentation

## 2013-05-08 DIAGNOSIS — F333 Major depressive disorder, recurrent, severe with psychotic symptoms: Secondary | ICD-10-CM

## 2013-05-08 DIAGNOSIS — Z9189 Other specified personal risk factors, not elsewhere classified: Secondary | ICD-10-CM

## 2013-05-08 DIAGNOSIS — F329 Major depressive disorder, single episode, unspecified: Secondary | ICD-10-CM

## 2013-05-08 DIAGNOSIS — G609 Hereditary and idiopathic neuropathy, unspecified: Secondary | ICD-10-CM

## 2013-05-08 DIAGNOSIS — K279 Peptic ulcer, site unspecified, unspecified as acute or chronic, without hemorrhage or perforation: Secondary | ICD-10-CM

## 2013-05-08 DIAGNOSIS — K219 Gastro-esophageal reflux disease without esophagitis: Secondary | ICD-10-CM

## 2013-05-08 DIAGNOSIS — I82409 Acute embolism and thrombosis of unspecified deep veins of unspecified lower extremity: Secondary | ICD-10-CM

## 2013-05-08 NOTE — ED Notes (Signed)
Per IVC papers: pt has been having nightmares and racing thoughts of suicide, unable to contract for safety, plan to jump into oncoming traffic.  Pt paranoid someone is trying to kill him. Pt on Coumadin and unable to be accepted at [facility].
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
## 2013-05-09 ENCOUNTER — Encounter (HOSPITAL_COMMUNITY): Payer: Self-pay | Admitting: Emergency Medicine

## 2013-05-09 DIAGNOSIS — F333 Major depressive disorder, recurrent, severe with psychotic symptoms: Secondary | ICD-10-CM

## 2013-05-09 DIAGNOSIS — F3289 Other specified depressive episodes: Secondary | ICD-10-CM

## 2013-05-09 DIAGNOSIS — F329 Major depressive disorder, single episode, unspecified: Secondary | ICD-10-CM

## 2013-05-09 DIAGNOSIS — F411 Generalized anxiety disorder: Secondary | ICD-10-CM

## 2013-05-09 LAB — CBC
HEMATOCRIT: 49.9 % (ref 39.0–52.0)
Hemoglobin: 17.7 g/dL — ABNORMAL HIGH (ref 13.0–17.0)
MCH: 31.4 pg (ref 26.0–34.0)
MCHC: 35.5 g/dL (ref 30.0–36.0)
MCV: 88.5 fL (ref 78.0–100.0)
Platelets: 224 10*3/uL (ref 150–400)
RBC: 5.64 MIL/uL (ref 4.22–5.81)
RDW: 13.3 % (ref 11.5–15.5)
WBC: 15.6 10*3/uL — ABNORMAL HIGH (ref 4.0–10.5)

## 2013-05-09 LAB — COMPREHENSIVE METABOLIC PANEL
ALT: 43 U/L (ref 0–53)
AST: 48 U/L — ABNORMAL HIGH (ref 0–37)
Albumin: 3.6 g/dL (ref 3.5–5.2)
Alkaline Phosphatase: 53 U/L (ref 39–117)
BUN: 18 mg/dL (ref 6–23)
CO2: 25 meq/L (ref 19–32)
Calcium: 9 mg/dL (ref 8.4–10.5)
Chloride: 105 mEq/L (ref 96–112)
Creatinine, Ser: 1.49 mg/dL — ABNORMAL HIGH (ref 0.50–1.35)
GFR calc Af Amer: 63 mL/min — ABNORMAL LOW (ref >90)
GFR calc non Af Amer: 55 mL/min — ABNORMAL LOW (ref >90)
GLUCOSE: 106 mg/dL — AB (ref 70–99)
POTASSIUM: 4.3 meq/L (ref 3.7–5.3)
Sodium: 143 mEq/L (ref 137–147)
Total Bilirubin: 1 mg/dL (ref 0.3–1.2)
Total Protein: 6.3 g/dL (ref 6.0–8.3)

## 2013-05-09 LAB — RAPID URINE DRUG SCREEN, HOSP PERFORMED
Amphetamines: NOT DETECTED
BARBITURATES: NOT DETECTED
BENZODIAZEPINES: NOT DETECTED
Cocaine: POSITIVE — AB
Opiates: NOT DETECTED
TETRAHYDROCANNABINOL: NOT DETECTED

## 2013-05-09 LAB — URINALYSIS, ROUTINE W REFLEX MICROSCOPIC
Glucose, UA: NEGATIVE mg/dL
Hgb urine dipstick: NEGATIVE
Ketones, ur: NEGATIVE mg/dL
NITRITE: NEGATIVE
PH: 5.5 (ref 5.0–8.0)
Protein, ur: NEGATIVE mg/dL
Specific Gravity, Urine: 1.02 (ref 1.005–1.030)
Urobilinogen, UA: 1 mg/dL (ref 0.0–1.0)

## 2013-05-09 LAB — SALICYLATE LEVEL: Salicylate Lvl: <2 mg/dL — ABNORMAL LOW (ref 2.8–20.0)

## 2013-05-09 LAB — URINE MICROSCOPIC-ADD ON

## 2013-05-09 LAB — ACETAMINOPHEN LEVEL: Acetaminophen (Tylenol), Serum: <15 ug/mL (ref 10–30)

## 2013-05-09 LAB — PROTIME-INR
INR: 1.21 (ref 0.00–1.49)
PROTHROMBIN TIME: 15 s (ref 11.6–15.2)

## 2013-05-09 LAB — ETHANOL: Alcohol, Ethyl (B): <11 mg/dL (ref 0–11)

## 2013-05-09 MED ORDER — SERTRALINE HCL 50 MG PO TABS
150.0000 mg | ORAL_TABLET | Freq: Every day | ORAL | Status: DC
  Administered 2013-05-09: 150 mg via ORAL
  Filled 2013-05-09: qty 3

## 2013-05-09 MED ORDER — RISPERIDONE 2 MG PO TBDP
3.0000 mg | ORAL_TABLET | Freq: Every day | ORAL | Status: DC
  Administered 2013-05-09: 3 mg via ORAL
  Filled 2013-05-09 (×2): qty 1

## 2013-05-09 MED ORDER — TRAZODONE HCL 50 MG PO TABS
150.0000 mg | ORAL_TABLET | Freq: Every day | ORAL | Status: DC
  Administered 2013-05-09: 150 mg via ORAL
  Filled 2013-05-09 (×2): qty 1

## 2013-05-09 MED ORDER — GABAPENTIN 300 MG PO CAPS
600.0000 mg | ORAL_CAPSULE | Freq: Three times a day (TID) | ORAL | Status: DC
  Administered 2013-05-09: 600 mg via ORAL
  Filled 2013-05-09 (×3): qty 2

## 2013-05-09 MED ORDER — WARFARIN - PHARMACIST DOSING INPATIENT: Freq: Every day | Status: DC

## 2013-05-09 MED ORDER — WARFARIN - PHYSICIAN DOSING INPATIENT: Freq: Every day | Status: DC

## 2013-05-09 MED ORDER — FAMOTIDINE 20 MG PO TABS
20.0000 mg | ORAL_TABLET | Freq: Every day | ORAL | Status: DC
  Administered 2013-05-09: 20 mg via ORAL
  Filled 2013-05-09: qty 1

## 2013-05-09 MED ORDER — WARFARIN SODIUM 7.5 MG PO TABS
7.5000 mg | ORAL_TABLET | Freq: Every day | ORAL | Status: DC
  Filled 2013-05-09: qty 1

## 2013-05-09 MED ORDER — WARFARIN SODIUM 7.5 MG PO TABS
7.5000 mg | ORAL_TABLET | Freq: Once | ORAL | Status: AC
  Administered 2013-05-09: 7.5 mg via ORAL
  Filled 2013-05-09 (×2): qty 1
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
## 2013-05-09 NOTE — ED Notes (Signed)
Patient to treatment area 40.
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
## 2013-05-09 NOTE — BHH Counselor (Addendum)
Per Dorothy PufferStacey Harrington from [facility], pt has been accepted for treatment.  Dr. Carroll SageBrenda Harris accepting. Pls call [phone] for report. Pt must be IVC'd prior to transport.  Pls fax to [phone]

## 2013-05-09 NOTE — ED Notes (Signed)
Pt has been seen and wanded by security.  Pt has 2 bags of belongings.   
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
## 2013-05-09 NOTE — BH Assessment (Signed)
05/09/2013 [facility]. [facility], [facility], or [facility]. [facility] Melissa [facility] and she stated it was not any male bed availability tonight, but beds may come open today after discharges. [facility] and there were no bed availability. [facility] Claud[facility] and a possible bed availability made come open this morning, faxed over all paper work and paper work will be look over this morning. Spoke with Stephanie CoupFanny at [facility] awaitng bed availability once discharges are made this morning, all paper work faxed over.

## 2013-05-09 NOTE — ED Notes (Signed)
[facility]

## 2013-05-18 ENCOUNTER — Ambulatory Visit: Payer: Self-pay | Attending: Internal Medicine

## 2013-05-18 ENCOUNTER — Other Ambulatory Visit: Payer: Self-pay | Admitting: Emergency Medicine

## 2013-05-18 DIAGNOSIS — I2699 Other pulmonary embolism without acute cor pulmonale: Secondary | ICD-10-CM

## 2013-05-18 MED ORDER — CLOTRIMAZOLE-BETAMETHASONE 1-0.05 % EX CREA: 1.0000 "application " | TOPICAL_CREAM | Freq: Two times a day (BID) | CUTANEOUS | Status: DC

## 2013-05-18 NOTE — Patient Instructions (Signed)
Pt instructed to take 5 mg today Coumadin and resume normal dose 7.5 mg and return in 2 weeks

## 2013-06-08 ENCOUNTER — Inpatient Hospital Stay (HOSPITAL_COMMUNITY)
Admission: EM | Admit: 2013-06-08 | Discharge: 2013-06-14 | DRG: 682 | Disposition: A | Discharge status: Other Acute Inpatient Hospital | Payer: Self-pay | Attending: Internal Medicine | Admitting: Internal Medicine

## 2013-06-08 ENCOUNTER — Emergency Department (HOSPITAL_COMMUNITY): Payer: Self-pay

## 2013-06-08 ENCOUNTER — Encounter (HOSPITAL_COMMUNITY): Payer: Self-pay | Admitting: Emergency Medicine

## 2013-06-08 ENCOUNTER — Inpatient Hospital Stay (HOSPITAL_COMMUNITY): Payer: Self-pay

## 2013-06-08 DIAGNOSIS — R440 Auditory hallucinations: Secondary | ICD-10-CM

## 2013-06-08 DIAGNOSIS — Z8659 Personal history of other mental and behavioral disorders: Secondary | ICD-10-CM

## 2013-06-08 DIAGNOSIS — F29 Unspecified psychosis not due to a substance or known physiological condition: Secondary | ICD-10-CM

## 2013-06-08 DIAGNOSIS — Z87891 Personal history of nicotine dependence: Secondary | ICD-10-CM

## 2013-06-08 DIAGNOSIS — I2699 Other pulmonary embolism without acute cor pulmonale: Secondary | ICD-10-CM | POA: Diagnosis present

## 2013-06-08 DIAGNOSIS — E872 Acidosis, unspecified: Secondary | ICD-10-CM | POA: Diagnosis present

## 2013-06-08 DIAGNOSIS — K219 Gastro-esophageal reflux disease without esophagitis: Secondary | ICD-10-CM

## 2013-06-08 DIAGNOSIS — I82409 Acute embolism and thrombosis of unspecified deep veins of unspecified lower extremity: Secondary | ICD-10-CM | POA: Diagnosis present

## 2013-06-08 DIAGNOSIS — W3400XA Accidental discharge from unspecified firearms or gun, initial encounter: Secondary | ICD-10-CM

## 2013-06-08 DIAGNOSIS — F332 Major depressive disorder, recurrent severe without psychotic features: Secondary | ICD-10-CM

## 2013-06-08 DIAGNOSIS — Z888 Allergy status to other drugs, medicaments and biological substances status: Secondary | ICD-10-CM

## 2013-06-08 DIAGNOSIS — S0193XA Puncture wound without foreign body of unspecified part of head, initial encounter: Secondary | ICD-10-CM

## 2013-06-08 DIAGNOSIS — F329 Major depressive disorder, single episode, unspecified: Secondary | ICD-10-CM

## 2013-06-08 DIAGNOSIS — K279 Peptic ulcer, site unspecified, unspecified as acute or chronic, without hemorrhage or perforation: Secondary | ICD-10-CM | POA: Diagnosis present

## 2013-06-08 DIAGNOSIS — Z86718 Personal history of other venous thrombosis and embolism: Secondary | ICD-10-CM

## 2013-06-08 DIAGNOSIS — E860 Dehydration: Secondary | ICD-10-CM

## 2013-06-08 DIAGNOSIS — F39 Unspecified mood [affective] disorder: Secondary | ICD-10-CM | POA: Diagnosis present

## 2013-06-08 DIAGNOSIS — G8929 Other chronic pain: Secondary | ICD-10-CM | POA: Diagnosis present

## 2013-06-08 DIAGNOSIS — R45851 Suicidal ideations: Secondary | ICD-10-CM

## 2013-06-08 DIAGNOSIS — Z86711 Personal history of pulmonary embolism: Secondary | ICD-10-CM

## 2013-06-08 DIAGNOSIS — Z7901 Long term (current) use of anticoagulants: Secondary | ICD-10-CM

## 2013-06-08 DIAGNOSIS — R7401 Elevation of levels of liver transaminase levels: Secondary | ICD-10-CM | POA: Diagnosis present

## 2013-06-08 DIAGNOSIS — D72829 Elevated white blood cell count, unspecified: Secondary | ICD-10-CM | POA: Diagnosis present

## 2013-06-08 DIAGNOSIS — F3289 Other specified depressive episodes: Secondary | ICD-10-CM

## 2013-06-08 DIAGNOSIS — N179 Acute kidney failure, unspecified: Principal | ICD-10-CM | POA: Diagnosis present

## 2013-06-08 DIAGNOSIS — M6282 Rhabdomyolysis: Secondary | ICD-10-CM | POA: Diagnosis present

## 2013-06-08 DIAGNOSIS — F4310 Post-traumatic stress disorder, unspecified: Secondary | ICD-10-CM | POA: Diagnosis present

## 2013-06-08 DIAGNOSIS — Z79899 Other long term (current) drug therapy: Secondary | ICD-10-CM

## 2013-06-08 DIAGNOSIS — K5900 Constipation, unspecified: Secondary | ICD-10-CM | POA: Diagnosis present

## 2013-06-08 DIAGNOSIS — R7402 Elevation of levels of lactic acid dehydrogenase (LDH): Secondary | ICD-10-CM | POA: Diagnosis present

## 2013-06-08 DIAGNOSIS — R740 Nonspecific elevation of levels of transaminase and lactic acid dehydrogenase [LDH]: Secondary | ICD-10-CM

## 2013-06-08 DIAGNOSIS — G9341 Metabolic encephalopathy: Secondary | ICD-10-CM | POA: Diagnosis present

## 2013-06-08 DIAGNOSIS — I10 Essential (primary) hypertension: Secondary | ICD-10-CM | POA: Diagnosis present

## 2013-06-08 DIAGNOSIS — F333 Major depressive disorder, recurrent, severe with psychotic symptoms: Secondary | ICD-10-CM | POA: Diagnosis present

## 2013-06-08 DIAGNOSIS — F411 Generalized anxiety disorder: Secondary | ICD-10-CM | POA: Diagnosis present

## 2013-06-08 DIAGNOSIS — F22 Delusional disorders: Secondary | ICD-10-CM | POA: Diagnosis present

## 2013-06-08 DIAGNOSIS — N390 Urinary tract infection, site not specified: Secondary | ICD-10-CM | POA: Diagnosis present

## 2013-06-08 DIAGNOSIS — R109 Unspecified abdominal pain: Secondary | ICD-10-CM

## 2013-06-08 DIAGNOSIS — Z87820 Personal history of traumatic brain injury: Secondary | ICD-10-CM

## 2013-06-08 DIAGNOSIS — R45850 Homicidal ideations: Secondary | ICD-10-CM

## 2013-06-08 LAB — PROTIME-INR
INR: 2.12 — ABNORMAL HIGH (ref 0.00–1.49)
Prothrombin Time: 23.1 seconds — ABNORMAL HIGH (ref 11.6–15.2)

## 2013-06-08 LAB — CBC
HCT: 49.9 % (ref 39.0–52.0)
HEMOGLOBIN: 17.6 g/dL — AB (ref 13.0–17.0)
MCH: 30.4 pg (ref 26.0–34.0)
MCHC: 35.3 g/dL (ref 30.0–36.0)
MCV: 86.2 fL (ref 78.0–100.0)
Platelets: 234 10*3/uL (ref 150–400)
RBC: 5.79 MIL/uL (ref 4.22–5.81)
RDW: 13.6 % (ref 11.5–15.5)
WBC: 15.3 10*3/uL — ABNORMAL HIGH (ref 4.0–10.5)

## 2013-06-08 LAB — COMPREHENSIVE METABOLIC PANEL
ALT: 28 U/L (ref 0–53)
AST: 25 U/L (ref 0–37)
Albumin: 3.6 g/dL (ref 3.5–5.2)
Alkaline Phosphatase: 68 U/L (ref 39–117)
BUN: 25 mg/dL — ABNORMAL HIGH (ref 6–23)
CALCIUM: 9.5 mg/dL (ref 8.4–10.5)
CO2: 17 mEq/L — ABNORMAL LOW (ref 19–32)
CREATININE: 2.01 mg/dL — AB (ref 0.50–1.35)
Chloride: 103 mEq/L (ref 96–112)
GFR calc non Af Amer: 38 mL/min — ABNORMAL LOW (ref >90)
GFR, EST AFRICAN AMERICAN: 44 mL/min — AB (ref >90)
GLUCOSE: 113 mg/dL — AB (ref 70–99)
Potassium: 4 mEq/L (ref 3.7–5.3)
SODIUM: 140 meq/L (ref 137–147)
Total Bilirubin: 0.9 mg/dL (ref 0.3–1.2)
Total Protein: 6.8 g/dL (ref 6.0–8.3)

## 2013-06-08 LAB — BLOOD GAS, ARTERIAL
Acid-base deficit: 3.9 mmol/L — ABNORMAL HIGH (ref 0.0–2.0)
BICARBONATE: 19.9 meq/L — AB (ref 20.0–24.0)
Drawn by: 11249
FIO2: 0.21 %
O2 Saturation: 93.7 %
PH ART: 7.38 (ref 7.350–7.450)
Patient temperature: 98.6
TCO2: 17.1 mmol/L (ref 0–100)
pCO2 arterial: 34.5 mmHg — ABNORMAL LOW (ref 35.0–45.0)
pO2, Arterial: 75.3 mmHg — ABNORMAL LOW (ref 80.0–100.0)

## 2013-06-08 LAB — CG4 I-STAT (LACTIC ACID): LACTIC ACID, VENOUS: 3.67 mmol/L — AB (ref 0.5–2.2)

## 2013-06-08 LAB — ETHANOL: Alcohol, Ethyl (B): <11 mg/dL (ref 0–11)

## 2013-06-08 LAB — CK: Total CK: 581 U/L — ABNORMAL HIGH (ref 7–232)

## 2013-06-08 LAB — URINALYSIS, ROUTINE W REFLEX MICROSCOPIC
GLUCOSE, UA: NEGATIVE mg/dL
KETONES UR: 40 mg/dL — AB
Nitrite: NEGATIVE
PROTEIN: 30 mg/dL — AB
Specific Gravity, Urine: 1.028 (ref 1.005–1.030)
Urobilinogen, UA: 0.2 mg/dL (ref 0.0–1.0)
pH: 5.5 (ref 5.0–8.0)

## 2013-06-08 LAB — TROPONIN I
Troponin I: <0.3 ng/mL (ref <0.30)
Troponin I: <0.3 ng/mL (ref <0.30)
Troponin I: <0.3 ng/mL (ref <0.30)

## 2013-06-08 LAB — URINE MICROSCOPIC-ADD ON

## 2013-06-08 LAB — RAPID URINE DRUG SCREEN, HOSP PERFORMED
Amphetamines: NOT DETECTED
BARBITURATES: NOT DETECTED
BENZODIAZEPINES: NOT DETECTED
COCAINE: POSITIVE — AB
OPIATES: NOT DETECTED
Tetrahydrocannabinol: NOT DETECTED

## 2013-06-08 LAB — LIPASE, BLOOD: Lipase: 12 U/L (ref 11–59)

## 2013-06-08 LAB — LACTIC ACID, PLASMA: LACTIC ACID, VENOUS: 1.8 mmol/L (ref 0.5–2.2)

## 2013-06-08 LAB — SALICYLATE LEVEL: Salicylate Lvl: <2 mg/dL — ABNORMAL LOW (ref 2.8–20.0)

## 2013-06-08 LAB — ACETAMINOPHEN LEVEL: Acetaminophen (Tylenol), Serum: <15 ug/mL (ref 10–30)

## 2013-06-08 LAB — MRSA PCR SCREENING: MRSA by PCR: NEGATIVE

## 2013-06-08 LAB — I-STAT CG4 LACTIC ACID, ED: LACTIC ACID, VENOUS: 2.6 mmol/L — AB (ref 0.5–2.2)

## 2013-06-08 MED ORDER — IOHEXOL 300 MG/ML  SOLN: 50.0000 mL | Freq: Once | INTRAMUSCULAR | Status: DC | PRN

## 2013-06-08 MED ORDER — SENNOSIDES-DOCUSATE SODIUM 8.6-50 MG PO TABS
2.0000 | ORAL_TABLET | Freq: Two times a day (BID) | ORAL | Status: DC
  Administered 2013-06-08 – 2013-06-14 (×10): 2 via ORAL
  Filled 2013-06-08 (×10): qty 2
  Filled 2013-06-08: qty 1
  Filled 2013-06-08 (×2): qty 2
  Filled 2013-06-08: qty 1
  Filled 2013-06-08 (×4): qty 2

## 2013-06-08 MED ORDER — TECHNETIUM TO 99M ALBUMIN AGGREGATED
5.4000 | Freq: Once | INTRAVENOUS | Status: AC | PRN
  Administered 2013-06-08: 5 via INTRAVENOUS

## 2013-06-08 MED ORDER — LORAZEPAM 2 MG/ML IJ SOLN
1.0000 mg | Freq: Once | INTRAMUSCULAR | Status: AC
  Administered 2013-06-08: 1 mg via INTRAVENOUS
  Filled 2013-06-08: qty 1

## 2013-06-08 MED ORDER — PAROXETINE HCL 20 MG PO TABS
20.0000 mg | ORAL_TABLET | Freq: Every day | ORAL | Status: DC
  Administered 2013-06-08 – 2013-06-14 (×7): 20 mg via ORAL
  Filled 2013-06-08 (×8): qty 1

## 2013-06-08 MED ORDER — SERTRALINE HCL 50 MG PO TABS
50.0000 mg | ORAL_TABLET | Freq: Every day | ORAL | Status: DC
  Administered 2013-06-08 – 2013-06-14 (×7): 50 mg via ORAL
  Filled 2013-06-08 (×8): qty 1

## 2013-06-08 MED ORDER — ONDANSETRON HCL 4 MG PO TABS: 4.0000 mg | ORAL_TABLET | Freq: Four times a day (QID) | ORAL | Status: DC | PRN

## 2013-06-08 MED ORDER — PRAZOSIN HCL 5 MG PO CAPS
5.0000 mg | ORAL_CAPSULE | Freq: Every day | ORAL | Status: DC
  Administered 2013-06-08 – 2013-06-13 (×6): 5 mg via ORAL
  Filled 2013-06-08 (×8): qty 1

## 2013-06-08 MED ORDER — QUETIAPINE FUMARATE 50 MG PO TABS
50.0000 mg | ORAL_TABLET | Freq: Every day | ORAL | Status: DC
  Administered 2013-06-08 – 2013-06-14 (×7): 50 mg via ORAL
  Filled 2013-06-08 (×7): qty 1

## 2013-06-08 MED ORDER — TECHNETIUM TC 99M DIETHYLENETRIAME-PENTAACETIC ACID: 44.0000 | Freq: Once | INTRAVENOUS | Status: AC | PRN

## 2013-06-08 MED ORDER — ONDANSETRON HCL 4 MG/2ML IJ SOLN
4.0000 mg | Freq: Four times a day (QID) | INTRAMUSCULAR | Status: DC | PRN
  Administered 2013-06-08 – 2013-06-09 (×5): 4 mg via INTRAVENOUS
  Filled 2013-06-08 (×5): qty 2

## 2013-06-08 MED ORDER — GI COCKTAIL ~~LOC~~
30.0000 mL | Freq: Once | ORAL | Status: AC
  Administered 2013-06-08: 30 mL via ORAL
  Filled 2013-06-08: qty 30

## 2013-06-08 MED ORDER — DEXTROSE 5 % IV SOLN
1.0000 g | INTRAVENOUS | Status: DC
  Administered 2013-06-08: 1 g via INTRAVENOUS
  Filled 2013-06-08 (×2): qty 10

## 2013-06-08 MED ORDER — HYDROMORPHONE HCL PF 1 MG/ML IJ SOLN
1.0000 mg | INTRAMUSCULAR | Status: DC | PRN
  Administered 2013-06-08 – 2013-06-10 (×10): 1 mg via INTRAVENOUS
  Filled 2013-06-08 (×10): qty 1

## 2013-06-08 MED ORDER — SODIUM CHLORIDE 0.9 % IV SOLN
INTRAVENOUS | Status: AC
  Administered 2013-06-08 (×2): via INTRAVENOUS

## 2013-06-08 MED ORDER — MORPHINE SULFATE 2 MG/ML IJ SOLN
1.0000 mg | INTRAMUSCULAR | Status: DC | PRN
  Administered 2013-06-08 (×2): 1 mg via INTRAVENOUS
  Filled 2013-06-08 (×2): qty 1

## 2013-06-08 MED ORDER — ACETAMINOPHEN 325 MG PO TABS
650.0000 mg | ORAL_TABLET | ORAL | Status: DC | PRN
  Administered 2013-06-08: 650 mg via ORAL
  Filled 2013-06-08 (×2): qty 2

## 2013-06-08 MED ORDER — POLYETHYLENE GLYCOL 3350 17 G PO PACK
17.0000 g | PACK | Freq: Every day | ORAL | Status: DC
  Filled 2013-06-08 (×7): qty 1

## 2013-06-08 MED ORDER — FENTANYL CITRATE 0.05 MG/ML IJ SOLN
50.0000 ug | Freq: Once | INTRAMUSCULAR | Status: AC
  Administered 2013-06-08: 50 ug via INTRAVENOUS
  Filled 2013-06-08: qty 2

## 2013-06-08 MED ORDER — QUETIAPINE FUMARATE 100 MG PO TABS
100.0000 mg | ORAL_TABLET | Freq: Every day | ORAL | Status: DC
  Administered 2013-06-08 – 2013-06-13 (×6): 100 mg via ORAL
  Filled 2013-06-08 (×8): qty 1

## 2013-06-08 MED ORDER — SODIUM CHLORIDE 0.9 % IV BOLUS (SEPSIS)
2000.0000 mL | Freq: Once | INTRAVENOUS | Status: AC
  Administered 2013-06-08: 2000 mL via INTRAVENOUS

## 2013-06-08 MED ORDER — DICYCLOMINE HCL 20 MG PO TABS
20.0000 mg | ORAL_TABLET | Freq: Once | ORAL | Status: AC
  Administered 2013-06-08: 20 mg via ORAL
  Filled 2013-06-08: qty 1

## 2013-06-08 MED ORDER — WARFARIN SODIUM 7.5 MG PO TABS
7.5000 mg | ORAL_TABLET | Freq: Every day | ORAL | Status: DC
  Administered 2013-06-08: 7.5 mg via ORAL
  Filled 2013-06-08 (×3): qty 1

## 2013-06-08 MED ORDER — ALUM & MAG HYDROXIDE-SIMETH 200-200-20 MG/5ML PO SUSP
30.0000 mL | Freq: Four times a day (QID) | ORAL | Status: DC | PRN
  Administered 2013-06-11: 30 mL via ORAL
  Filled 2013-06-08: qty 30

## 2013-06-08 MED ORDER — WARFARIN - PHARMACIST DOSING INPATIENT: Freq: Every day | Status: DC
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
## 2013-06-08 NOTE — ED Notes (Signed)
Pt states he's been off his medication x 4 days, is now having hallucinations telling him to hurt himself and other people.

## 2013-06-08 NOTE — ED Notes (Signed)
Pt brought to hospital tonight for medical clearance voluntarily  Pt went to [facility] but they would not accept him because he takes coumadin
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
## 2013-06-08 NOTE — ED Notes (Signed)
Called floor to give report RN unavailable will return phone.

## 2013-06-09 DIAGNOSIS — R109 Unspecified abdominal pain: Secondary | ICD-10-CM

## 2013-06-09 LAB — BASIC METABOLIC PANEL
BUN: 17 mg/dL (ref 6–23)
CALCIUM: 7.7 mg/dL — AB (ref 8.4–10.5)
CO2: 23 mEq/L (ref 19–32)
Chloride: 108 mEq/L (ref 96–112)
Creatinine, Ser: 1.31 mg/dL (ref 0.50–1.35)
GFR calc Af Amer: 75 mL/min — ABNORMAL LOW (ref >90)
GFR, EST NON AFRICAN AMERICAN: 64 mL/min — AB (ref >90)
GLUCOSE: 114 mg/dL — AB (ref 70–99)
Potassium: 4.1 mEq/L (ref 3.7–5.3)
Sodium: 139 mEq/L (ref 137–147)

## 2013-06-09 LAB — CBC
HCT: 39.4 % (ref 39.0–52.0)
HEMOGLOBIN: 13.2 g/dL (ref 13.0–17.0)
MCH: 30.1 pg (ref 26.0–34.0)
MCHC: 33.5 g/dL (ref 30.0–36.0)
MCV: 90 fL (ref 78.0–100.0)
PLATELETS: 152 10*3/uL (ref 150–400)
RBC: 4.38 MIL/uL (ref 4.22–5.81)
RDW: 13.9 % (ref 11.5–15.5)
WBC: 5.2 10*3/uL (ref 4.0–10.5)

## 2013-06-09 LAB — URINE CULTURE
COLONY COUNT: NO GROWTH
CULTURE: NO GROWTH

## 2013-06-09 LAB — PROTIME-INR
INR: 1.68 — AB (ref 0.00–1.49)
Prothrombin Time: 19.3 seconds — ABNORMAL HIGH (ref 11.6–15.2)

## 2013-06-09 MED ORDER — OXYCODONE HCL 5 MG PO TABS
10.0000 mg | ORAL_TABLET | ORAL | Status: DC | PRN
  Administered 2013-06-09 – 2013-06-14 (×14): 10 mg via ORAL
  Filled 2013-06-09 (×14): qty 2

## 2013-06-09 MED ORDER — WARFARIN SODIUM 10 MG PO TABS
10.0000 mg | ORAL_TABLET | Freq: Once | ORAL | Status: AC
  Administered 2013-06-09: 10 mg via ORAL
  Filled 2013-06-09: qty 1

## 2013-06-09 MED ORDER — GI COCKTAIL ~~LOC~~
30.0000 mL | Freq: Once | ORAL | Status: AC
  Administered 2013-06-09: 30 mL via ORAL
  Filled 2013-06-09: qty 30

## 2013-06-09 MED ORDER — OXYCODONE HCL 5 MG PO TABS
5.0000 mg | ORAL_TABLET | ORAL | Status: DC | PRN
  Administered 2013-06-09: 5 mg via ORAL
  Filled 2013-06-09: qty 1

## 2013-06-09 MED ORDER — PANTOPRAZOLE SODIUM 40 MG PO TBEC
40.0000 mg | DELAYED_RELEASE_TABLET | Freq: Every day | ORAL | Status: DC
  Administered 2013-06-09 – 2013-06-14 (×6): 40 mg via ORAL
  Filled 2013-06-09 (×9): qty 1

## 2013-06-09 MED ORDER — HYDROXYZINE HCL 25 MG PO TABS
25.0000 mg | ORAL_TABLET | ORAL | Status: DC | PRN
  Administered 2013-06-09 – 2013-06-13 (×5): 25 mg via ORAL
  Filled 2013-06-09 (×5): qty 1

## 2013-06-09 MED ORDER — CLINDAMYCIN PHOSPHATE 1 % EX SOLN
1.0000 "application " | Freq: Two times a day (BID) | CUTANEOUS | Status: DC
  Administered 2013-06-09: 1 via TOPICAL
  Filled 2013-06-09: qty 30

## 2013-06-09 MED ORDER — TRAZODONE HCL 50 MG PO TABS
50.0000 mg | ORAL_TABLET | Freq: Every day | ORAL | Status: DC
  Administered 2013-06-09 – 2013-06-13 (×5): 50 mg via ORAL
  Filled 2013-06-09 (×6): qty 1

## 2013-06-09 MED ORDER — CLOTRIMAZOLE 1 % EX CREA
TOPICAL_CREAM | Freq: Two times a day (BID) | CUTANEOUS | Status: DC
  Administered 2013-06-09 – 2013-06-13 (×8): via TOPICAL
  Filled 2013-06-09 (×3): qty 15
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
## 2013-06-09 NOTE — Progress Notes (Signed)
UR completed 

## 2013-06-09 NOTE — Progress Notes (Signed)
ANTICOAGULATION CONSULT NOTE - Follow Up Consult  Pharmacy Consult for Warfarin Indication: Hx PE and DVT  Allergies  Allergen Reactions  . Ibuprofen Hives    Patient Measurements: Height: 5\' 11"  (180.3 cm) Weight: 180 lb 8.9 oz (81.9 kg) IBW/kg (Calculated) : 75.3  Vital Signs: Temp: 97.6 F (36.4 C) (03/07 0000) Temp src: Oral (03/07 0000) BP: 101/48 mmHg (03/07 0600)  Labs:  Recent Labs  06/08/13 0154  06/08/13 [id number] 06/08/13 0935 06/08/13 1547 06/08/13 2155 06/09/13 0326  HGB 17.6*  --   --   --   --   --  13.2  HCT 49.9  --   --   --   --   --  39.4  PLT 234  --   --   --   --   --  152  LABPROT 23.1*  --   --   --   --   --  19.3*  INR 2.12*  --   --   --   --   --  1.68*  CREATININE 2.01*  --   --   --   --   --  1.31  CKTOTAL  --   --  581*  --   --   --   --   TROPONINI  --   < > <0.30 <0.30 <0.30 <0.30  --   < > = values in this interval not displayed.  Estimated Creatinine Clearance: 75 ml/min (by C-G formula based on Cr of 1.31).   Medications:  Scheduled:  . cefTRIAXone (ROCEPHIN)  IV  1 g Intravenous Q24H  . clindamycin  1 application Topical BID  . PARoxetine  20 mg Oral Daily  . polyethylene glycol  17 g Oral Daily  . prazosin  5 mg Oral QHS  . QUEtiapine  100 mg Oral QHS  . QUEtiapine  50 mg Oral Daily  . senna-docusate  2 tablet Oral BID  . sertraline  50 mg Oral Daily  . warfarin  7.5 mg Oral q1800  . Warfarin - Pharmacist Dosing Inpatient   Does not apply q1800   Infusions:    Assessment: 47 yom admitted with acute psychosis. 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 INR still in goal on admit (2.12).  INR decreased 1.68 as expected after missing doses this week. 7.5mg  resumed last pm  CBC wnl, no bleeding/complications reported.   Goal of Therapy:  INR 2-3 Monitor platelets by anticoagulation protocol: Yes   Plan:   Increase warfarin 10mg  po today  Daily  PT/INR  Loralee PacasErin Meryn Sarracino, PharmD, BCPS Pager: [phone] 06/09/2013,7:52 AM

## 2013-06-10 ENCOUNTER — Inpatient Hospital Stay (HOSPITAL_COMMUNITY): Payer: Self-pay

## 2013-06-10 DIAGNOSIS — E860 Dehydration: Secondary | ICD-10-CM

## 2013-06-10 DIAGNOSIS — K5900 Constipation, unspecified: Secondary | ICD-10-CM | POA: Diagnosis present

## 2013-06-10 LAB — PROTIME-INR
INR: 1.7 — AB (ref 0.00–1.49)
Prothrombin Time: 19.5 seconds — ABNORMAL HIGH (ref 11.6–15.2)

## 2013-06-10 MED ORDER — HYDROMORPHONE HCL PF 1 MG/ML IJ SOLN
1.0000 mg | INTRAMUSCULAR | Status: DC | PRN
  Administered 2013-06-10 – 2013-06-11 (×5): 1 mg via INTRAVENOUS
  Administered 2013-06-11: 2 mg via INTRAVENOUS
  Administered 2013-06-11 – 2013-06-12 (×4): 1 mg via INTRAVENOUS
  Administered 2013-06-12: 2 mg via INTRAVENOUS
  Administered 2013-06-12: 1 mg via INTRAVENOUS
  Administered 2013-06-13 (×2): 2 mg via INTRAVENOUS
  Filled 2013-06-10 (×2): qty 1
  Filled 2013-06-10 (×2): qty 2
  Filled 2013-06-10 (×2): qty 1
  Filled 2013-06-10 (×3): qty 2
  Filled 2013-06-10: qty 1
  Filled 2013-06-10 (×3): qty 2
  Filled 2013-06-10 (×2): qty 1

## 2013-06-10 MED ORDER — BISACODYL 10 MG RE SUPP
10.0000 mg | Freq: Once | RECTAL | Status: DC
  Filled 2013-06-10: qty 1

## 2013-06-10 MED ORDER — FLEET ENEMA 7-19 GM/118ML RE ENEM: 1.0000 | ENEMA | Freq: Every day | RECTAL | Status: DC | PRN

## 2013-06-10 MED ORDER — CHLORPROMAZINE HCL 25 MG PO TABS
25.0000 mg | ORAL_TABLET | Freq: Three times a day (TID) | ORAL | Status: DC | PRN
  Administered 2013-06-10 – 2013-06-12 (×3): 25 mg via ORAL
  Filled 2013-06-10 (×4): qty 1

## 2013-06-10 MED ORDER — WARFARIN SODIUM 10 MG PO TABS
10.0000 mg | ORAL_TABLET | Freq: Once | ORAL | Status: AC
  Administered 2013-06-10: 10 mg via ORAL
  Filled 2013-06-10: qty 1

## 2013-06-10 MED ORDER — MAGNESIUM HYDROXIDE 400 MG/5ML PO SUSP: 30.0000 mL | Freq: Every day | ORAL | Status: DC | PRN
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
## 2013-06-10 NOTE — Progress Notes (Signed)
ANTICOAGULATION CONSULT NOTE - Follow Up Consult  Pharmacy Consult for Warfarin Indication: Hx PE and DVT  Allergies  Allergen Reactions  . Ibuprofen Hives    Patient Measurements: Height: 5\' 11"  (180.3 cm) Weight: 216 lb 11.4 oz (98.3 kg) IBW/kg (Calculated) : 75.3  Vital Signs: Temp: 97.6 F (36.4 C) (03/08 0533) Temp src: Oral (03/08 0533) BP: 111/68 mmHg (03/08 0533) Pulse Rate: 71 (03/08 0533)  Labs:  Recent Labs  06/08/13 0154  06/08/13 8288 06/08/13 0935 06/08/13 1547 06/08/13 2155 06/09/13 0326 06/10/13 0434  HGB 17.6*  --   --   --   --   --  13.2  --   HCT 49.9  --   --   --   --   --  39.4  --   PLT 234  --   --   --   --   --  152  --   LABPROT 23.1*  --   --   --   --   --  19.3* 19.5*  INR 2.12*  --   --   --   --   --  1.68* 1.70*  CREATININE 2.01*  --   --   --   --   --  1.31  --   CKTOTAL  --   --  581*  --   --   --   --   --   TROPONINI  --   < > <0.30 <0.30 <0.30 <0.30  --   --   < > = values in this interval not displayed.  Estimated Creatinine Clearance: 84.2 ml/min (by C-G formula based on Cr of 1.31).  Assessment: 10 yom admitted with acute psychosis. 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 INR still in goal on admit (2.12).  INR up only slightly after 7.5mg  and 10mg .   CBC wnl yest. No bleeding/complications reported.  Goal of Therapy:  INR 2-3 Monitor platelets by anticoagulation protocol: Yes   Plan:   Repeat warfarin 10mg  po today.   Daily PT/INR.  CBC in am and q48h.  Charolotte Eke, PharmD, pager [phone]. 06/10/2013,9:22 AM.

## 2013-06-11 ENCOUNTER — Inpatient Hospital Stay (HOSPITAL_COMMUNITY): Payer: Self-pay

## 2013-06-11 DIAGNOSIS — F332 Major depressive disorder, recurrent severe without psychotic features: Secondary | ICD-10-CM

## 2013-06-11 DIAGNOSIS — F29 Unspecified psychosis not due to a substance or known physiological condition: Secondary | ICD-10-CM

## 2013-06-11 LAB — PROTIME-INR
INR: 2.37 — ABNORMAL HIGH (ref 0.00–1.49)
Prothrombin Time: 25.1 seconds — ABNORMAL HIGH (ref 11.6–15.2)

## 2013-06-11 LAB — CBC
HCT: 41.3 % (ref 39.0–52.0)
Hemoglobin: 13.9 g/dL (ref 13.0–17.0)
MCH: 30.1 pg (ref 26.0–34.0)
MCHC: 33.7 g/dL (ref 30.0–36.0)
MCV: 89.4 fL (ref 78.0–100.0)
PLATELETS: 190 10*3/uL (ref 150–400)
RBC: 4.62 MIL/uL (ref 4.22–5.81)
RDW: 13.7 % (ref 11.5–15.5)
WBC: 4 10*3/uL (ref 4.0–10.5)

## 2013-06-11 MED ORDER — CLOTRIMAZOLE 1 % EX CREA: TOPICAL_CREAM | Freq: Two times a day (BID) | CUTANEOUS | Status: DC

## 2013-06-11 MED ORDER — SENNOSIDES-DOCUSATE SODIUM 8.6-50 MG PO TABS: 2.0000 | ORAL_TABLET | Freq: Two times a day (BID) | ORAL | Status: DC

## 2013-06-11 MED ORDER — WARFARIN SODIUM 2.5 MG PO TABS
2.5000 mg | ORAL_TABLET | Freq: Once | ORAL | Status: AC
  Administered 2013-06-11: 2.5 mg via ORAL
  Filled 2013-06-11: qty 1

## 2013-06-11 MED ORDER — POLYETHYLENE GLYCOL 3350 17 G PO PACK: 17.0000 g | PACK | Freq: Every day | ORAL | Status: DC

## 2013-06-11 MED ORDER — MAGNESIUM HYDROXIDE 400 MG/5ML PO SUSP: 30.0000 mL | Freq: Every day | ORAL | Status: DC | PRN

## 2013-06-11 NOTE — Progress Notes (Addendum)
Clinical Social Work  IVC served by [employer]. Valid from 06/11/13-06/18/13. IVC forms on chart.  CSW contacted the following facilities re: placement:  [facility]-no available beds  [facility] to [facility] and suggested that CSW refer [facility]  [facility]- no available beds  [facility] [place] [facility]# [id number] valid from 06/11/13-06/18/13. CSW faxed referral and completed phone assessment.  [facility]- no available beds.  [facility]  [facility]- unsure of bed status. Referral sent  [facility]- no available beds  [facility]- DC anticipated. Referral sent (Addendum 1540-patient declined due to acuity)  [facility]- unsure of bed status. Referral sent  [facility]- left message inquiring about bed availability  [facility]- no available beds  [facility]- no [facility] beds  CSW will continue to follow for placement needs.  [place] Valley[place]olly Dawnielle [place], [place] [phone]
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
## 2013-06-11 NOTE — Progress Notes (Signed)
ANTICOAGULATION CONSULT NOTE - Follow Up Consult  Pharmacy Consult for Warfarin Indication: Hx PE and DVT  Allergies  Allergen Reactions  . Ibuprofen Hives    Patient Measurements: Height: 5\' 11"  (180.3 cm) Weight: 217 lb 6 oz (98.6 kg) IBW/kg (Calculated) : 75.3  Vital Signs: Temp: 97.5 F (36.4 C) (03/09 0446) Temp src: Oral (03/09 0446) BP: 152/80 mmHg (03/09 0446) Pulse Rate: 79 (03/09 0446)  Labs:  Recent Labs  06/08/13 0935 06/08/13 1547 06/08/13 2155 06/09/13 0326 06/10/13 0434 06/11/13 0451  HGB  --   --   --  13.2  --  13.9  HCT  --   --   --  39.4  --  41.3  PLT  --   --   --  152  --  190  LABPROT  --   --   --  19.3* 19.5* 25.1*  INR  --   --   --  1.68* 1.70* 2.37*  CREATININE  --   --   --  1.31  --   --   TROPONINI <0.30 <0.30 <0.30  --   --   --     Estimated Creatinine Clearance: 84.3 ml/min (by C-G formula based on Cr of 1.31).  Assessment: 37 yom admitted with acute psychosis. 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 INR still in goal on admit (2.12).  INR 2.37, increased quickly overnight into therapeutic range.  Inpatient doses: 7.5mg , 10mg , 10mg .  CBC wnl. No bleeding/complications reported.  Goal of Therapy:  INR 2-3 Monitor platelets by anticoagulation protocol: Yes   Plan:   Reduce warfarin dose to 2.5 mg po today.  Daily PT/INR.  Clance Boll, PharmD, BCPS Pager: [phone] 06/11/2013 8:27 AM
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
## 2013-06-12 DIAGNOSIS — F3289 Other specified depressive episodes: Secondary | ICD-10-CM

## 2013-06-12 DIAGNOSIS — F329 Major depressive disorder, single episode, unspecified: Secondary | ICD-10-CM

## 2013-06-12 LAB — PROTIME-INR
INR: 2.29 — ABNORMAL HIGH (ref 0.00–1.49)
PROTHROMBIN TIME: 24.5 s — AB (ref 11.6–15.2)

## 2013-06-12 MED ORDER — WARFARIN SODIUM 7.5 MG PO TABS
7.5000 mg | ORAL_TABLET | Freq: Once | ORAL | Status: AC
Start: 2013-06-12 — End: 2013-06-12
  Administered 2013-06-12: 7.5 mg via ORAL
  Filled 2013-06-12: qty 1

## 2013-06-12 NOTE — Care Management Note (Addendum)
    Page 1 of 1   06/15/2013     10:20:07 AM   CARE MANAGEMENT NOTE 06/15/2013  Patient:  Dale [name], Dale [name]   Account Number:  [id number]  Date Initiated:  06/12/2013  Documentation initiated by:  Lanier Clam  Subjective/Objective Assessment:   48 Y/O M ADMITTED W/ARF.QX:[id number].     Action/Plan:   BROUGHT IN TO HOSPITAL BY POLICE-WANDERING STREETS.   Anticipated DC Date:  06/14/2013   Anticipated DC Plan:  [facility]      DC Planning Services  CM consult      Choice offered to / List presented to:             Status of service:  Completed, signed off Medicare Important Message given?   (If response is "NO", the following Medicare IM given date fields will be blank) Date Medicare IM given:   Date Additional Medicare IM given:    Discharge Disposition:  [facility]YCHIATRIC HOSPITAL  Per UR Regulation:  Reviewed for med. necessity/level of care/duration of stay  If discussed at Long Length of Stay Meetings, dates discussed:   06/14/2013    Comments:  06/15/13 Mekhi Lascola RN,BSN NCM [phone] D/C INPT PSYCH YESTERDAY.  06/14/13 Jahmez Bily RN,BSN NCM [phone] MED STABLE,UNSAFE D/C,1:1,PSYCH FOLLOWING, AWAITING INPT PSYCH.PSYCH SW FOLLOWING DILIGENTLY.  06/12/13 Marlicia Sroka RN,BSN NCM [phone] 1:1,MED STABLE AWAITING INPT PSYCH.
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
## 2013-06-12 NOTE — Progress Notes (Addendum)
Clinical Social Work  CSW continues to Financial controller for placement:  [facility]- no available beds  [facility]- declined on 3/9  [facility]- unsure of bed status but encouraged to fax referral. Referral sent.  [facility]- spoke with admissions who confirms all information received and waiting for MD to review (Addendum- patient accepted to waiting list)   [facility]- no available beds  [facility]- possible available beds and encouraged to fax referral. Referral sent. (Addendum -declined due to acuity)  [facility]- declined on 3/9  [facility]- no available beds  [facility]- declined on 3/9  [facility]- received referral but no available beds  [facility]- left a message with admissions  [facility]- no available beds  [facility]- no available beds  CSW will continue to follow.  [place], [place] [phone]

## 2013-06-12 NOTE — Progress Notes (Signed)
ANTICOAGULATION CONSULT NOTE - Follow Up Consult  Pharmacy Consult for Warfarin Indication: Hx PE and DVT  Allergies  Allergen Reactions  . Ibuprofen Hives    Patient Measurements: Height: 5\' 11"  (180.3 cm) Weight: 217 lb 6 oz (98.6 kg) IBW/kg (Calculated) : 75.3  Vital Signs: Temp: 98 F (36.7 C) (03/10 0525) Temp src: Oral (03/10 0525) BP: 117/66 mmHg (03/10 0525) Pulse Rate: 72 (03/10 0525)  Labs:  Recent Labs  06/10/13 0434 06/11/13 0451 06/12/13 0520  HGB  --  13.9  --   HCT  --  41.3  --   PLT  --  190  --   LABPROT 19.5* 25.1* 24.5*  INR 1.70* 2.37* 2.29*    Estimated Creatinine Clearance: 84.3 ml/min (by C-G formula based on Cr of 1.31).  Assessment: 25 yom admitted with acute psychosis. 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 INR still in goal on admit (2.12).  INR 2.29.  Inpatient doses: 7.5mg , 10mg , 10mg , 2.5mg .  Provided a reduced dose last night due to quick increase in INR.  INR trended down today.  Will resume home dose today.  CBC wnl. No bleeding/complications reported.  Goal of Therapy:  INR 2-3 Monitor platelets by anticoagulation protocol: Yes   Plan:   Warfarin 7.5 mg po today.  Daily PT/INR.  Clance Boll, PharmD, BCPS Pager: [phone] 06/12/2013 11:56 AM

## 2013-06-13 DIAGNOSIS — R45851 Suicidal ideations: Secondary | ICD-10-CM

## 2013-06-13 DIAGNOSIS — I82409 Acute embolism and thrombosis of unspecified deep veins of unspecified lower extremity: Secondary | ICD-10-CM

## 2013-06-13 LAB — PROTIME-INR
INR: 1.78 — AB (ref 0.00–1.49)
Prothrombin Time: 20.2 seconds — ABNORMAL HIGH (ref 11.6–15.2)

## 2013-06-13 LAB — CBC
HEMATOCRIT: 42.1 % (ref 39.0–52.0)
HEMOGLOBIN: 14.3 g/dL (ref 13.0–17.0)
MCH: 30.1 pg (ref 26.0–34.0)
MCHC: 34 g/dL (ref 30.0–36.0)
MCV: 88.6 fL (ref 78.0–100.0)
Platelets: 199 10*3/uL (ref 150–400)
RBC: 4.75 MIL/uL (ref 4.22–5.81)
RDW: 13.4 % (ref 11.5–15.5)
WBC: 4.8 10*3/uL (ref 4.0–10.5)

## 2013-06-13 NOTE — Progress Notes (Addendum)
Clinical Social Work  Update on placement:  [facility]- admissions reports possible DC and encouraged CSW to fax referral. Referral sent (Addendum: 1650-denied due to acuity)  [facility]- declined on 3/9   [facility]- MD reviewing referral and will call once determination is made  [facility]- patient on waiting list  [facility]- no available beds  [facility]- declined on 3/10  [facility]- declined on 3/9   [facility]- no available beds  [facility]- declined on 3/9   [facility]- received referral but no available beds   [facility]- no available beds  [facility]- no available beds   [facility]- no available beds  [facility]- available beds. Referral faxed.  CSW will continue to follow to assist with placement.  [place], [place] [phone]

## 2013-06-13 NOTE — Progress Notes (Signed)
Clinical Social Work Progress Note PSYCHIATRY SERVICE LINE 06/13/2013  Patient:  Dale [name]  Account:  [id number]  [place] Date:  06/08/2013  Clinical Social Worker:  Sindy Messing, LCSW  Date/Time:  06/13/2013 02:15 PM  Review of Patient  Overall Medical Condition:   Patient is medically stable but waiting on psych placement.   Participation Level:  Minimal  Participation Quality  Guarded   Other Participation Quality:   Patient guarded and withdrawn during assessment. Patient avoids eye contact and answers with brief responses.   Affect  Flat   Cognitive  Appropriate   Reaction to Medications/Concerns:   None reported   Modes of Intervention  Support   Summary of Progress/Plan at Discharge   CSW met with patient at bedside. Patient laying in bed and watching basketball on TV. Patient complaining of stomach pain but reports he has let his RN know already.    Patient reports he continues to feel depressed and anxious. Patient reports his anxiety is related to pain at times. Patient endorses that he continues to experience auditory and visual hallucinations. Patient reports that voices continue to tell him to hurt himself and others but did not voice any specific plans. Patient reports that he has not noticed that medication is effective and feels that he is in the same place he was at when he was admitted.    CSW explained that psych MD is recommending inpatient treatment and patient is agreeable. Patient remains under IVC which does not expire until 06/18/13. CSW called [facility] and asked for updated psych MD note for [name].
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
## 2013-06-14 DIAGNOSIS — R443 Hallucinations, unspecified: Secondary | ICD-10-CM

## 2013-06-14 LAB — PROTIME-INR
INR: 1.7 — AB (ref 0.00–1.49)
Prothrombin Time: 19.5 seconds — ABNORMAL HIGH (ref 11.6–15.2)

## 2013-06-14 MED ORDER — WARFARIN SODIUM 10 MG PO TABS
10.0000 mg | ORAL_TABLET | ORAL | Status: AC
  Administered 2013-06-14: 10 mg via ORAL
  Filled 2013-06-14: qty 1

## 2013-06-14 NOTE — Progress Notes (Signed)
Clinical Social Work  CSW spoke with [facility] and confirmed that patient remains on waiting list. Patient has no other psych placement options at this time and psych MD continues to recommend inpatient placement. CSW updated MD on DC barriers and agreeable to keep team updated once bed is available at [facility].  [place], [place] [phone]

## 2013-06-14 NOTE — Progress Notes (Signed)
ANTICOAGULATION CONSULT NOTE - Follow Up Consult  Pharmacy Consult for Warfarin Indication: Hx PE and DVT  Allergies  Allergen Reactions  . Ibuprofen Hives    Patient Measurements: Height: 5\' 11"  (180.3 cm) Weight: 212 lb 11.9 oz (96.5 kg) IBW/kg (Calculated) : 75.3  Vital Signs: Temp: 97.4 F (36.3 C) (03/12 0511) Temp src: Oral (03/12 0511) BP: 121/49 mmHg (03/12 0511) Pulse Rate: 74 (03/12 0511)  Labs:  Recent Labs  06/12/13 0520 06/13/13 0400 06/14/13 0527  HGB  --  14.3  --   HCT  --  42.1  --   PLT  --  199  --   LABPROT 24.5* 20.2* 19.5*  INR 2.29* 1.78* 1.70*    Estimated Creatinine Clearance: 83.5 ml/min (by C-G formula based on Cr of 1.31).  Assessment: [age] yom admitted with acute psychosis. 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 INR still in goal on admit (2.12).  INR 1.7 and dropping.  Inpatient doses: 7.5mg , 10mg , 10mg , 2.5mg , 7.5mg , 0mg .  Provided a reduced dose last night due to quick increase in INR.  INR trended down today.  Will resume home dose today.  Note that dose of warfarin was not given yesterday as was mistakenly not ordered  CBC wnl. No bleeding/complications reported.  Goal of Therapy:  INR 2-3 Monitor platelets by anticoagulation protocol: Yes   Plan:  1) Boost dose of 10mg  warfarin today  2) Daily INR   Hessie KnowsJustin M Jaira Canady, PharmD, BCPS Pager [phone] 06/14/2013 10:44 AM

## 2013-06-14 NOTE — Discharge Summary (Signed)
Patient will [facility] be [facility] today. DC Summary dictated on 06/12/13. No changes and no new events since then.  Peggye PittEstela Hernandez, MD [facility] Pager: [phone]

## 2013-06-14 NOTE — Progress Notes (Signed)
Pt accepted by [facility].   MD and RN aware.  Pt aware of plans.   Sherriff will transport.   No further social work needs identified at this times.  CSW signing off.   Loletta Specter, MSW, LCSW Clinical Social Work Coverage for [place], [place] [phone]

## 2013-07-02 ENCOUNTER — Other Ambulatory Visit: Payer: Self-pay

## 2013-07-31 ENCOUNTER — Ambulatory Visit: Payer: No Typology Code available for payment source | Attending: Internal Medicine | Admitting: Pharmacist

## 2013-07-31 DIAGNOSIS — I82409 Acute embolism and thrombosis of unspecified deep veins of unspecified lower extremity: Secondary | ICD-10-CM | POA: Insufficient documentation

## 2013-07-31 DIAGNOSIS — Z5181 Encounter for therapeutic drug level monitoring: Secondary | ICD-10-CM

## 2013-07-31 LAB — POCT INR: INR: 3.4

## 2013-07-31 MED ORDER — WARFARIN SODIUM 3 MG PO TABS: 6.0000 mg | ORAL_TABLET | Freq: Every day | ORAL | Status: DC

## 2013-08-07 ENCOUNTER — Ambulatory Visit: Payer: Self-pay | Attending: Internal Medicine | Admitting: Pharmacist

## 2013-08-07 DIAGNOSIS — I82409 Acute embolism and thrombosis of unspecified deep veins of unspecified lower extremity: Secondary | ICD-10-CM | POA: Insufficient documentation

## 2013-08-07 DIAGNOSIS — Z5181 Encounter for therapeutic drug level monitoring: Secondary | ICD-10-CM | POA: Insufficient documentation

## 2013-08-07 LAB — POCT INR: INR: 1.7

## 2013-08-14 ENCOUNTER — Ambulatory Visit: Payer: Self-pay | Attending: Internal Medicine | Admitting: Pharmacist

## 2013-08-14 DIAGNOSIS — Z5181 Encounter for therapeutic drug level monitoring: Secondary | ICD-10-CM | POA: Insufficient documentation

## 2013-08-14 DIAGNOSIS — I82409 Acute embolism and thrombosis of unspecified deep veins of unspecified lower extremity: Secondary | ICD-10-CM

## 2013-08-14 DIAGNOSIS — Z7901 Long term (current) use of anticoagulants: Secondary | ICD-10-CM | POA: Insufficient documentation

## 2013-08-14 LAB — POCT INR: INR: 1.8

## 2013-08-14 MED ORDER — WARFARIN SODIUM 3 MG PO TABS: ORAL_TABLET | ORAL | Status: DC

## 2013-08-23 ENCOUNTER — Ambulatory Visit: Payer: No Typology Code available for payment source | Attending: Internal Medicine | Admitting: Pharmacist

## 2013-08-23 DIAGNOSIS — E119 Type 2 diabetes mellitus without complications: Secondary | ICD-10-CM | POA: Insufficient documentation

## 2013-08-23 DIAGNOSIS — Z5181 Encounter for therapeutic drug level monitoring: Secondary | ICD-10-CM

## 2013-08-23 DIAGNOSIS — I82409 Acute embolism and thrombosis of unspecified deep veins of unspecified lower extremity: Secondary | ICD-10-CM

## 2013-08-23 LAB — POCT INR: INR: 4.9

## 2013-08-25 ENCOUNTER — Emergency Department (HOSPITAL_COMMUNITY): Payer: No Typology Code available for payment source

## 2013-08-25 ENCOUNTER — Emergency Department (HOSPITAL_COMMUNITY)
Admission: EM | Admit: 2013-08-25 | Discharge: 2013-08-26 | Disposition: A | Discharge status: Home / Self Care | Payer: No Typology Code available for payment source | Attending: Emergency Medicine | Admitting: Emergency Medicine

## 2013-08-25 ENCOUNTER — Encounter (HOSPITAL_COMMUNITY): Payer: Self-pay | Admitting: Emergency Medicine

## 2013-08-25 DIAGNOSIS — F3289 Other specified depressive episodes: Secondary | ICD-10-CM | POA: Insufficient documentation

## 2013-08-25 DIAGNOSIS — R51 Headache: Secondary | ICD-10-CM | POA: Insufficient documentation

## 2013-08-25 DIAGNOSIS — F329 Major depressive disorder, single episode, unspecified: Secondary | ICD-10-CM | POA: Insufficient documentation

## 2013-08-25 DIAGNOSIS — R52 Pain, unspecified: Secondary | ICD-10-CM | POA: Insufficient documentation

## 2013-08-25 DIAGNOSIS — Z79899 Other long term (current) drug therapy: Secondary | ICD-10-CM | POA: Insufficient documentation

## 2013-08-25 DIAGNOSIS — F1410 Cocaine abuse, uncomplicated: Secondary | ICD-10-CM

## 2013-08-25 DIAGNOSIS — R45851 Suicidal ideations: Secondary | ICD-10-CM

## 2013-08-25 DIAGNOSIS — Z86718 Personal history of other venous thrombosis and embolism: Secondary | ICD-10-CM | POA: Insufficient documentation

## 2013-08-25 DIAGNOSIS — I10 Essential (primary) hypertension: Secondary | ICD-10-CM | POA: Insufficient documentation

## 2013-08-25 DIAGNOSIS — R079 Chest pain, unspecified: Secondary | ICD-10-CM

## 2013-08-25 DIAGNOSIS — R000 Tachycardia, unspecified: Secondary | ICD-10-CM | POA: Insufficient documentation

## 2013-08-25 DIAGNOSIS — F32A Depression, unspecified: Secondary | ICD-10-CM

## 2013-08-25 DIAGNOSIS — Z87891 Personal history of nicotine dependence: Secondary | ICD-10-CM | POA: Insufficient documentation

## 2013-08-25 DIAGNOSIS — R519 Headache, unspecified: Secondary | ICD-10-CM

## 2013-08-25 DIAGNOSIS — Z87820 Personal history of traumatic brain injury: Secondary | ICD-10-CM | POA: Insufficient documentation

## 2013-08-25 DIAGNOSIS — Z7901 Long term (current) use of anticoagulants: Secondary | ICD-10-CM | POA: Insufficient documentation

## 2013-08-25 HISTORY — DX: Suicidal ideations: R45.851

## 2013-08-25 HISTORY — DX: Follicular disorder, unspecified: L73.9

## 2013-08-25 LAB — TROPONIN I: Troponin I: <0.3 ng/mL (ref <0.30)

## 2013-08-25 LAB — COMPREHENSIVE METABOLIC PANEL
ALK PHOS: 68 U/L (ref 39–117)
ALT: 35 U/L (ref 0–53)
AST: 42 U/L — ABNORMAL HIGH (ref 0–37)
Albumin: 3.7 g/dL (ref 3.5–5.2)
BILIRUBIN TOTAL: 0.5 mg/dL (ref 0.3–1.2)
BUN: 10 mg/dL (ref 6–23)
CO2: 26 meq/L (ref 19–32)
Calcium: 9.4 mg/dL (ref 8.4–10.5)
Chloride: 109 mEq/L (ref 96–112)
Creatinine, Ser: 1.5 mg/dL — ABNORMAL HIGH (ref 0.50–1.35)
GFR, EST AFRICAN AMERICAN: 63 mL/min — AB (ref >90)
GFR, EST NON AFRICAN AMERICAN: 54 mL/min — AB (ref >90)
Glucose, Bld: 112 mg/dL — ABNORMAL HIGH (ref 70–99)
POTASSIUM: 4.7 meq/L (ref 3.7–5.3)
SODIUM: 146 meq/L (ref 137–147)
Total Protein: 6.8 g/dL (ref 6.0–8.3)

## 2013-08-25 LAB — URINALYSIS, ROUTINE W REFLEX MICROSCOPIC
BILIRUBIN URINE: NEGATIVE
Glucose, UA: NEGATIVE mg/dL
KETONES UR: NEGATIVE mg/dL
Leukocytes, UA: NEGATIVE
NITRITE: NEGATIVE
PROTEIN: NEGATIVE mg/dL
Specific Gravity, Urine: 1.039 — ABNORMAL HIGH (ref 1.005–1.030)
UROBILINOGEN UA: 0.2 mg/dL (ref 0.0–1.0)
pH: 6.5 (ref 5.0–8.0)

## 2013-08-25 LAB — RAPID URINE DRUG SCREEN, HOSP PERFORMED
Amphetamines: NOT DETECTED
BARBITURATES: NOT DETECTED
Benzodiazepines: NOT DETECTED
Cocaine: POSITIVE — AB
Opiates: NOT DETECTED
Tetrahydrocannabinol: NOT DETECTED

## 2013-08-25 LAB — I-STAT CHEM 8, ED
BUN: 10 mg/dL (ref 6–23)
CHLORIDE: 109 meq/L (ref 96–112)
Calcium, Ion: 1.25 mmol/L — ABNORMAL HIGH (ref 1.12–1.23)
Creatinine, Ser: 1.5 mg/dL — ABNORMAL HIGH (ref 0.50–1.35)
GLUCOSE: 109 mg/dL — AB (ref 70–99)
HCT: 50 % (ref 39.0–52.0)
Hemoglobin: 17 g/dL (ref 13.0–17.0)
POTASSIUM: 4.6 meq/L (ref 3.7–5.3)
Sodium: 145 mEq/L (ref 137–147)
TCO2: 27 mmol/L (ref 0–100)

## 2013-08-25 LAB — URINE MICROSCOPIC-ADD ON

## 2013-08-25 LAB — CBC
HEMATOCRIT: 47 % (ref 39.0–52.0)
Hemoglobin: 16.3 g/dL (ref 13.0–17.0)
MCH: 30.4 pg (ref 26.0–34.0)
MCHC: 34.7 g/dL (ref 30.0–36.0)
MCV: 87.5 fL (ref 78.0–100.0)
Platelets: 200 10*3/uL (ref 150–400)
RBC: 5.37 MIL/uL (ref 4.22–5.81)
RDW: 13.5 % (ref 11.5–15.5)
WBC: 8.8 10*3/uL (ref 4.0–10.5)

## 2013-08-25 LAB — PROTIME-INR
INR: 2.1 — ABNORMAL HIGH (ref 0.00–1.49)
PROTHROMBIN TIME: 22.9 s — AB (ref 11.6–15.2)

## 2013-08-25 LAB — I-STAT TROPONIN, ED: Troponin i, poc: 0 ng/mL (ref 0.00–0.08)

## 2013-08-25 MED ORDER — OXYCODONE-ACETAMINOPHEN 5-325 MG PO TABS
2.0000 | ORAL_TABLET | Freq: Once | ORAL | Status: AC
  Administered 2013-08-25: 2 via ORAL
  Filled 2013-08-25: qty 2

## 2013-08-25 MED ORDER — LORAZEPAM 2 MG/ML IJ SOLN
1.0000 mg | Freq: Once | INTRAMUSCULAR | Status: AC
  Administered 2013-08-25: 1 mg via INTRAVENOUS
  Filled 2013-08-25: qty 1

## 2013-08-25 MED ORDER — TRAMADOL HCL 50 MG PO TABS
50.0000 mg | ORAL_TABLET | Freq: Two times a day (BID) | ORAL | Status: DC | PRN
Start: 2013-08-25 — End: 2013-08-26
  Administered 2013-08-25: 50 mg via ORAL
  Filled 2013-08-25: qty 1

## 2013-08-25 MED ORDER — HYDROMORPHONE HCL PF 1 MG/ML IJ SOLN
0.5000 mg | Freq: Once | INTRAMUSCULAR | Status: AC
  Administered 2013-08-25: 0.5 mg via INTRAVENOUS
  Filled 2013-08-25: qty 1

## 2013-08-25 MED ORDER — GABAPENTIN 300 MG PO CAPS
600.0000 mg | ORAL_CAPSULE | Freq: Two times a day (BID) | ORAL | Status: DC
  Administered 2013-08-25: 600 mg via ORAL
  Filled 2013-08-25: qty 2

## 2013-08-25 MED ORDER — GABAPENTIN 300 MG PO CAPS
600.0000 mg | ORAL_CAPSULE | Freq: Three times a day (TID) | ORAL | Status: DC
Start: 2013-08-25 — End: 2013-08-26
  Administered 2013-08-25 – 2013-08-26 (×2): 600 mg via ORAL
  Filled 2013-08-25 (×2): qty 2

## 2013-08-25 MED ORDER — WARFARIN SODIUM 6 MG PO TABS
6.0000 mg | ORAL_TABLET | Freq: Every day | ORAL | Status: DC
  Administered 2013-08-25: 6 mg via ORAL
  Filled 2013-08-25 (×2): qty 1

## 2013-08-25 MED ORDER — SODIUM CHLORIDE 0.9 % IV BOLUS (SEPSIS)
1000.0000 mL | Freq: Once | INTRAVENOUS | Status: AC
  Administered 2013-08-25: 1000 mL via INTRAVENOUS

## 2013-08-25 MED ORDER — ACETAMINOPHEN 500 MG PO TABS
500.0000 mg | ORAL_TABLET | Freq: Four times a day (QID) | ORAL | Status: DC | PRN
  Administered 2013-08-25: 500 mg via ORAL
  Filled 2013-08-25: qty 1

## 2013-08-25 MED ORDER — PRAZOSIN HCL 5 MG PO CAPS
5.0000 mg | ORAL_CAPSULE | Freq: Every day | ORAL | Status: DC
  Administered 2013-08-25: 5 mg via ORAL
  Filled 2013-08-25 (×2): qty 1

## 2013-08-25 MED ORDER — QUETIAPINE FUMARATE 200 MG PO TABS
200.0000 mg | ORAL_TABLET | Freq: Every day | ORAL | Status: DC
  Administered 2013-08-25: 200 mg via ORAL
  Filled 2013-08-25: qty 1

## 2013-08-25 MED ORDER — DIPHENHYDRAMINE HCL 50 MG/ML IJ SOLN
25.0000 mg | Freq: Once | INTRAMUSCULAR | Status: AC
  Administered 2013-08-25: 25 mg via INTRAVENOUS
  Filled 2013-08-25: qty 1

## 2013-08-25 MED ORDER — METHYLPREDNISOLONE SODIUM SUCC 125 MG IJ SOLR
125.0000 mg | Freq: Once | INTRAMUSCULAR | Status: AC
  Administered 2013-08-25: 125 mg via INTRAVENOUS
  Filled 2013-08-25: qty 2

## 2013-08-25 MED ORDER — IOHEXOL 350 MG/ML SOLN
100.0000 mL | Freq: Once | INTRAVENOUS | Status: AC | PRN
  Administered 2013-08-25: 100 mL via INTRAVENOUS

## 2013-08-25 MED ORDER — DIPHENHYDRAMINE HCL 25 MG PO CAPS
25.0000 mg | ORAL_CAPSULE | Freq: Four times a day (QID) | ORAL | Status: DC | PRN
  Administered 2013-08-25 (×2): 25 mg via ORAL
  Filled 2013-08-25 (×2): qty 1

## 2013-08-25 MED ORDER — WARFARIN - PHYSICIAN DOSING INPATIENT: Freq: Every day | Status: DC

## 2013-08-25 MED ORDER — ACETAMINOPHEN 325 MG PO TABS: 650.0000 mg | ORAL_TABLET | Freq: Once | ORAL | Status: DC

## 2013-08-25 MED ORDER — ONDANSETRON HCL 4 MG/2ML IJ SOLN
4.0000 mg | Freq: Once | INTRAMUSCULAR | Status: AC
  Administered 2013-08-25: 4 mg via INTRAVENOUS
  Filled 2013-08-25: qty 2

## 2013-08-25 NOTE — ED Notes (Signed)
Pt reports when he went to restroom, he had bright red blood from penis. MD notified. And pt informed to collect urine specimen.
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
## 2013-08-25 NOTE — ED Notes (Signed)
Patient in room speaking with tele-psych at this time.

## 2013-08-25 NOTE — ED Notes (Signed)
Transferred patient to new room assignment.

## 2013-08-25 NOTE — BH Assessment (Signed)
Received call for assessment. Spoke to Dr. Eber HongBrian Miller who said Pt has a history of PTSD and today became anxious and had multiple medical symptoms. He has been medically cleared but reports suicidal ideation. Tele-assessment will be initiated.  Harlin RainFord Ellis Ria CommentWarrick Jr, LPC, [facility] Triage Specialist [phone]

## 2013-08-25 NOTE — Progress Notes (Signed)
The following facilities have been contacted regarding bed availability for inptx: [facility]- per Tammy can fax for review [facility]- per KimberlyAlice beds available [facility]- [facility]- per Broadus JohnWarren can fax for review [facility]- per Olegario MessierKathy can fax for review   The following facilities are at capacity: [facility]- per Ileene Hutchinsonkeisha [facility]- per Ferne [facility]- per [facility]oy Libmanasey [facility]- per [facility]Sus[facility]- per [facility]- per [facility]- per [facility]- per [facility]- [facility]- per [facility]- per Dannielle Huhanny only very very low acuity [facility]- per [facility]- per Tillie FantasiaLeah  [facility]- per [facility] only [facility]- per [facility]- per Flora LippsBrian   Aiyanah Kalama Disposition MHT

## 2013-08-25 NOTE — ED Notes (Addendum)
Pt. C/o chest pain. States that it gets worse when he takes a deep breathe or when I press on his chest. States that he still is having tingling in his left hand. He states he usually takes tramadol off the street for it. Advised he does not have tramadol available right now. Offered him him tylenol but he refused stating it would not do anything for him.

## 2013-08-25 NOTE — ED Provider Notes (Addendum)
I obtained check out on this patient from Dr. Hyacinth Meeker. I was asked to see patient by nursing regarding a rash. He put on the blue paper scrubs. He is having redness of the skin and itching overlying the area of the top scrub. No difficulty breathing. No swelling noted in the mouth. No stridor. No wheezing. Does have erythema to his chest arms and legs. Has multiple areas of papules. He states that in the past has been told it was "folliculitis. These have been present for more the week. Erythema to the the skin is new. No frank urticaria. Plan: Benadryl, solu-medrol. He'll be placed in gowns, rather than a paper scrubs.  Rolland Porter, MD 08/25/13 [id number]  Rolland Porter, MD 08/25/13 1520

## 2013-08-25 NOTE — ED Notes (Signed)
Spoke with Adela Lank at [facility].  Advised patient meets criteria for inpatient but they have no available beds at this time and will look for placement at other facilities.
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
## 2013-08-25 NOTE — ED Notes (Signed)
Patient requested and received saltine crackers and a Ginger Ale.

## 2013-08-25 NOTE — ED Notes (Signed)
Patient requested a drink.  Ginger ale given to patient.

## 2013-08-25 NOTE — ED Notes (Addendum)
Presents from [place] with SI that began while working out in the yard. Pt also reports he felt his heart racing and had chest pain and SOB associated with a headache that came on "like BOOM" pt is alert and oriented. Reports that he feels nauseated and has the "worst headache I have ever had"  HEadache, dizziness and nausea began at 7:30 pm this evening.

## 2013-08-25 NOTE — BH Assessment (Signed)
Assessment complete. Rosey BathKelly Southard, [facility], confirms adult unit is at capacity. Gave clinical report to Alberteen SamFran Hobson, NP who said Pt meets criteria for inpatient psychiatric treatment. TTS will contact other facilities for placement. Notified Dr. Eber HongBrian Miller and Marda StalkerAndrew Fix, RN of recommendation.  Harlin RainFord Ellis Ria CommentWarrick Jr, LPC, [facility] Triage Specialist [phone]

## 2013-08-26 LAB — PROTIME-INR
INR: 1.63 — ABNORMAL HIGH (ref 0.00–1.49)
Prothrombin Time: 18.9 seconds — ABNORMAL HIGH (ref 11.6–15.2)

## 2013-08-26 LAB — TROPONIN I

## 2013-08-26 MED ORDER — DOCUSATE SODIUM 100 MG PO CAPS
200.0000 mg | ORAL_CAPSULE | Freq: Once | ORAL | Status: AC
  Administered 2013-08-26: 200 mg via ORAL
  Filled 2013-08-26: qty 2

## 2013-08-26 NOTE — BH Assessment (Signed)
[facility] Assessment Progress Note  Update:  Pt seen this day for reassessment.  Pt currently denies SI, HI or psychosis, stating, "I don't think I need to be admitted.  I just came in because I felt sick."  Pt stated he doesn't want to harm himself and doesn't feel like anyone is after him or out to get him anymore.  Pt stated he is a pt of [facility] and just got his psychotropic meds filled on Friday.  Pt does continue to report nausea and headache.  Informed pt to tell his nurse these complaints.  He stated he didn't sleep last night very well.  Pt is cooperative, calm, oriented x 4, has logical/coherent thoughts, and good insight into his mental health issues.  As pt stated he could contract for safety, consulted with EDP Radford Pax, who stated pt could be discharged back to his current provider @ 1215.  Informed pt's nurse, Kriste Basque, and she will assist with No Harm Contract for pt that will be kept in pt's chart.  Suicide prevention information also sent to the pt.  Pt to follow up with [facility] [place].  He has two upcoming appts for therapy and medication management by report.  Updated ED and TTS staff.  Casimer Lanius, MS, [facility] Licensed Professional Counselor Triage Specialist

## 2013-08-26 NOTE — Discharge Instructions (Signed)
°Emergency Department Resource Guide °1) Find a Doctor and Pay Out of Pocket °Although you won't have to find out who is covered by your insurance plan, it is a good idea to ask around and get recommendations. You will then need to call the office and see if the doctor you have chosen will accept you as a new patient and what types of options they offer for patients who are self-pay. Some doctors offer discounts or will set up payment plans for their patients who do not have insurance, but you will need to ask so you aren't surprised when you get to your appointment. ° °2) Contact Your Local Health Department °Not all health departments have doctors that can see patients for sick visits, but many do, so it is worth a call to see if yours does. If you don't know where your local health department is, you can check in your phone book. The CDC also has a tool to help you locate your state's health department, and many state websites also have listings of all of their local health departments. ° °3) Find a Walk-in Clinic °If your illness is not likely to be very severe or complicated, you may want to try a walk in clinic. These are popping up all over the country in pharmacies, drugstores, and shopping centers. They're usually staffed by nurse practitioners or physician assistants that have been trained to treat common illnesses and complaints. They're usually fairly quick and inexpensive. However, if you have serious medical issues or chronic medical problems, these are probably not your best option. ° °No Primary Care Doctor: °- Call [facility] at  832-8000 - they can help you locate a primary care doctor that  accepts your insurance, provides certain services, etc. °- Physician Referral Service- 1-800-533-3463 ° °Chronic Pain Problems: °Organization         Address  Phone   Notes  °[facility]  (336) 297-2271 Patients need to be referred by their primary care doctor.  ° °Medication  Assistance: °Organization         Address  Phone   Notes  °[place] [facility] [address] °[place], [place] [address] (336) 641-8030 --Must be a resident of [place] °-- Must have NO insurance coverage whatsoever (no Medicaid/ Medicare, etc.) °-- The pt. MUST have a primary care doctor that directs their care regularly and follows them in the community °  °[facility]  (866) 331-1348   °United Way  (888) 892-1162   ° °Agencies that provide inexpensive medical care: °Organization         Address  Phone   Notes  °[facility]  (336) 832-8035   °[facility]    (336) 832-7272   °[facility] [address] °[place], [place] [address] (336) 832-4777   °[facility] [address], °[place] (336) 271-4999   °[facility]    (336) 373-0678   °[facility]    (336) 272-1050   °[facility] ° [address], [place] Phone:  (336) 832-4444, Fax:  (336) 832-4440 Hours of Operation:  9 am - 6 pm, M-F.  Also accepts Medicaid/Medicare and self-pay.  °[facility] ° [address], [place] Phone: (336) 832-3150, Fax: (336) 832-3151. Hours of Operation:  8:30 am - 5:30 pm, M-F.  Also accepts Medicaid and self-pay.  °[facility] [place] [address], [place] Phone: (336) 878-6027   °[facility] [address], [place], [place] (336)723-1848, Ext. 123 Mondays & Thursdays: 7-9 AM.  First 15 patients are seen on a first come, first serve basis. °  ° °Medicaid-accepting [place] Providers: ° °Organization         Address  Phone   Notes  °[facility] [address], [place] (336) 641-2100 Also accepts self-pay patients.  °[facility] [address], [place] ° (336) 856-9996   °[facility] [address], [place]  (336) 288-8857   °[facility] [address] [place] [address], [place] (336) 299-7000   °Veita Bland [address], [place]  ° (336) 373-1557 Only accepts [place] Access Medicaid patients after they have their name applied to their card.  ° °Self-Pay (no insurance) in [place]: ° °Organization         Address  Phone   Notes  °Sickle Cell Patients, [facility] [address], [place] (336) 832-1970   °[facility] [address], [place] (336) 832-4400   °[facility] ° [address],  (336) 992-4800   °[facility]/Dr. Osei-Bonsu ° [address] [place] [address], [place] or [address], [place] (336) 841-8500 Phone number for both [place] and [place] locations is the same.  °[facility] [address], [place] (336) 299-0000   °[facility] [address] [place] [address], [place] or [address] (336) 852-7530 °(336) 878-2260   °[facility] [address], [place] (336) 350-1642, phone; (336) 294-5005, fax Sees patients 1st and 3rd Saturday of every month.  Must not qualify for public or private insurance (i.e. Medicaid, Medicare, [place] Health Choice, Veterans' Benefits) • Household income should be no more than 200% of the poverty level •The clinic cannot treat you if you are pregnant or think you are pregnant • Sexually transmitted diseases are not treated at the clinic.  ° ° °Dental Care: °Organization         Address  Phone  Notes  °[place] [facility] [address], [place] (336) 641-6152 Accepts children up to age 21 who are enrolled in Medicaid or [place] Health Choice; pregnant women with a Medicaid card; and children who have applied for Medicaid or [place] Health Choice, but were declined, whose parents can pay a reduced fee at time of service.  °[place]  [facility] [place]  [address], [place] (336) 641-7733 Accepts children up to age 21 who are enrolled in Medicaid or [name] Health Choice; pregnant women with a Medicaid card; and children who have applied for Medicaid or [place] Health Choice, but were declined, whose parents can pay a reduced fee at time of service.  °[facility] ° [address],  (336) 641-4533 Patients are seen by appointment only. Walk-ins are not accepted. [facility] will see patients 18 years of age and older. °Monday - Tuesday (8am-5pm) °Most Wednesdays (8:30-5pm) °$30 per visit, cash only  °[facility] ° [address], [place] (336) 641-4533 Patients are seen by appointment only. Walk-ins are not accepted. [facility] will see patients 18 years of age and older. °One   Wednesday Evening (Monthly: Volunteer Based).  $30 per visit, cash only  °[facility]  (919) 537-3737 for adults; Children under age 4, call Graduate Pediatric Dentistry at ([phone]) [phone]. Children aged 4-14, please call (919) [phone] to request a pediatric application. ° Dental services are provided in all areas of dental care including fillings, crowns and bridges, complete and partial dentures, implants, gum treatment, root canals, and extractions. Preventive care is also provided. Treatment is provided to both adults and children. °Patients are selected via a lottery and there is often a waiting list. °  °[facility] [address], °[place] ° (336) 763-8833 [email or web address] °  °[facility] [address], [place], [place] (336)723-1848, Ext. 123 Second and Fourth Thursday of each month, opens at 6:30 AM; Clinic ends at 9 AM.  Patients are seen on a first-come first-served basis, and a limited number are seen during each clinic.  ° °[facility] ° [address], [place], [place] (336) 723-7904    Eligibility Requirements °You must have lived in [place], or [place] counties for at least the last three months. °  You cannot be eligible for state or federal sponsored healthcare insurance, including Veterans Administration, Medicaid, or Medicare. °  You generally cannot be eligible for healthcare insurance through your employer.  °  How to apply: °Eligibility screenings are held every Tuesday and Wednesday afternoon from 1:00 pm until 4:00 pm. You do not need an appointment for the interview!  °[facility] [address], [place], [place] 336-631-2330   °[facility] [facility]  336-342-8273   °[facility]  336-703-3100   °[facility]  336-570-6415   ° °Behavioral Health Resources in the Community: °Intensive Outpatient Programs °Organization         Address  Phone  Notes  °[facility] [address], [place], [place] 336-878-6098   °[facility] [address], [place], [place] 336-832-9800   °[facility] [address], [place], [place] ° 336-882-2125   °[facility] [address],  °[place], [place] 1-800-853-5163 or 336-641-4981   °Substance Abuse Resources °Organization         Address  Phone  Notes  °[facility]  336-882-2125   °[facility]  336-784-9470   °[facility]  336-285-9073   °[facility]  336-845-3988   °Residential & Outpatient Substance Abuse Program  1-800-659-3381   °Psychological Services °Organization         Address  Phone  Notes  ° Health  336- 832-9600   °[facility]  336- 378-7881   °[facility] [address], [place] 1-800-853-5163 or 336-641-4981   ° °Mobile Crisis Teams °Organization         Address  Phone  Notes  °[facility], Mobile Crisis Care Unit  1-877-626-1772   °Assertive °[facility] ° [address]  [place], [place] 336-834-9664   °Sharon DeEsch [address] °[place] 336-554-5454   ° °Self-Help/Support Groups °Organization         Address  Phone             Notes  °[facility] - variety of support groups  336- 373-1402 Call for more information  °Narcotics Anonymous (NA), [facility] [address], °[place]  2 meetings at this location  ° °  Residential Treatment Programs °Organization         Address  Phone  Notes  °[facility] [address],    °[place]  1-866-801-8205   °[facility] ° [address], [place], [place] 704-293-8524   °[facility] [address], [place] 336-845-3988 Admissions: 8am-3pm M-F  °[facility] [address]    °[place], [place] 336-841-1104   °[facility] [address], [place], [place] 336-379-7146   °[facility] [address]  °[place], [place] 336-285-9073   °[facility] Intensive Outpatient [address], [place], [place] 336-852-3033   °[facility] [address]  °[place], [place] 1-877-615-2722 or 336-784-9470   °[facility] [address] [place], [place] 336-227-7417 Accepts Medicaid  °[facility] [address]  °[place] 1-800-659-3381 Substance Abuse/Addiction Treatment  ° °[place] Behavioral Health Resources °Organization         Address  Phone  Notes  °[facility]  (888) 581-9988   °Julie Brannon, PhD [address] [place], [place]   (336) 349-5553 or (336) 951-0000   °[facility]   [address] °[place], [place] (336) 349-4454   °[facility] [address], [place], [place] (336) 342-8316 Insurance/Medicaid/sponsorship through [facility]  °[facility] [address]                                    [place], [place] (336) 342-8316 Therapy/tele-psych/case    °[facility] [address]  ° [place], [place] (336) 349-2233    °Dr. Arfeen  (336) 349-4544   °[facility] [place]  [employer] [place] [facility] 1) [address], [place] °2) [address], [place] °3)  [address], [place] (336) 349-3220 °(336) 342-7768 ° °(336) 342-8140   °[place] Child Abuse [facility] (336) 342-1394 or (336) 342-3537 (After Hours)    ° ° °
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
## 2013-08-26 NOTE — ED Provider Notes (Signed)
Patient presents to be seen by the physician do to persistent chest discomfort. His pain is sharp and worse with movement. Appears the muscle skeletal nature. I did check an EKG which is unchanged from his prior studies. I will check another troponin. Do not think this represents ACS.  Toy Baker, MD 08/26/13 [phone]

## 2013-08-30 ENCOUNTER — Ambulatory Visit: Payer: No Typology Code available for payment source | Attending: Internal Medicine | Admitting: Pharmacist

## 2013-08-30 DIAGNOSIS — I82409 Acute embolism and thrombosis of unspecified deep veins of unspecified lower extremity: Secondary | ICD-10-CM

## 2013-08-30 DIAGNOSIS — Z7901 Long term (current) use of anticoagulants: Secondary | ICD-10-CM | POA: Insufficient documentation

## 2013-08-30 DIAGNOSIS — Z5181 Encounter for therapeutic drug level monitoring: Secondary | ICD-10-CM | POA: Insufficient documentation

## 2013-08-30 LAB — POCT INR: INR: 1.9

## 2013-09-11 ENCOUNTER — Ambulatory Visit: Payer: No Typology Code available for payment source | Attending: Internal Medicine | Admitting: Pharmacist

## 2013-09-11 DIAGNOSIS — I82409 Acute embolism and thrombosis of unspecified deep veins of unspecified lower extremity: Secondary | ICD-10-CM

## 2013-09-11 DIAGNOSIS — Z7901 Long term (current) use of anticoagulants: Secondary | ICD-10-CM | POA: Insufficient documentation

## 2013-09-11 DIAGNOSIS — Z5181 Encounter for therapeutic drug level monitoring: Secondary | ICD-10-CM | POA: Insufficient documentation

## 2013-09-11 LAB — POCT INR
INR: 2.7
INR: 2.7

## 2013-09-15 ENCOUNTER — Emergency Department (HOSPITAL_COMMUNITY)
Admission: EM | Admit: 2013-09-15 | Discharge: 2013-09-15 | Disposition: A | Discharge status: Home / Self Care | Payer: No Typology Code available for payment source | Attending: Emergency Medicine | Admitting: Emergency Medicine

## 2013-09-15 ENCOUNTER — Encounter (HOSPITAL_COMMUNITY): Payer: Self-pay | Admitting: Emergency Medicine

## 2013-09-15 DIAGNOSIS — Z87891 Personal history of nicotine dependence: Secondary | ICD-10-CM | POA: Insufficient documentation

## 2013-09-15 DIAGNOSIS — M5430 Sciatica, unspecified side: Secondary | ICD-10-CM

## 2013-09-15 DIAGNOSIS — I10 Essential (primary) hypertension: Secondary | ICD-10-CM | POA: Insufficient documentation

## 2013-09-15 DIAGNOSIS — X500XXA Overexertion from strenuous movement or load, initial encounter: Secondary | ICD-10-CM | POA: Insufficient documentation

## 2013-09-15 DIAGNOSIS — Z7901 Long term (current) use of anticoagulants: Secondary | ICD-10-CM | POA: Insufficient documentation

## 2013-09-15 DIAGNOSIS — R269 Unspecified abnormalities of gait and mobility: Secondary | ICD-10-CM | POA: Insufficient documentation

## 2013-09-15 DIAGNOSIS — Z872 Personal history of diseases of the skin and subcutaneous tissue: Secondary | ICD-10-CM | POA: Insufficient documentation

## 2013-09-15 DIAGNOSIS — S335XXA Sprain of ligaments of lumbar spine, initial encounter: Secondary | ICD-10-CM | POA: Insufficient documentation

## 2013-09-15 DIAGNOSIS — Y9389 Activity, other specified: Secondary | ICD-10-CM | POA: Insufficient documentation

## 2013-09-15 DIAGNOSIS — Z79899 Other long term (current) drug therapy: Secondary | ICD-10-CM | POA: Insufficient documentation

## 2013-09-15 DIAGNOSIS — Y929 Unspecified place or not applicable: Secondary | ICD-10-CM | POA: Insufficient documentation

## 2013-09-15 DIAGNOSIS — S39012A Strain of muscle, fascia and tendon of lower back, initial encounter: Secondary | ICD-10-CM

## 2013-09-15 MED ORDER — CYCLOBENZAPRINE HCL 10 MG PO TABS: 10.0000 mg | ORAL_TABLET | Freq: Every day | ORAL | Status: DC

## 2013-09-15 MED ORDER — KETOROLAC TROMETHAMINE 30 MG/ML IJ SOLN
30.0000 mg | Freq: Once | INTRAMUSCULAR | Status: AC
  Administered 2013-09-15: 30 mg via INTRAMUSCULAR
  Filled 2013-09-15: qty 1

## 2013-09-15 MED ORDER — TRAMADOL HCL 50 MG PO TABS: 50.0000 mg | ORAL_TABLET | Freq: Four times a day (QID) | ORAL | Status: DC | PRN

## 2013-09-15 NOTE — Discharge Instructions (Signed)
Call for a follow up appointment with a Family or Primary Care Provider.  °Return if Symptoms worsen.   °Take medication as prescribed.  °Ice your back 3-4 times a day. °

## 2013-09-15 NOTE — ED Notes (Signed)
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 Can bear wt but painful with movement. Is taking tylenol for pain. Pt is a x 4

## 2013-09-15 NOTE — ED Notes (Signed)
Patient refused the wheelchair.
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
## 2013-09-20 NOTE — ED Provider Notes (Signed)
Medical screening examination/treatment/procedure(s) were performed by non-physician practitioner and as supervising physician I was immediately available for consultation/collaboration.   EKG Interpretation None        Mindy Gali H Quandarius Nill, MD 09/20/13 0702 

## 2013-09-25 ENCOUNTER — Ambulatory Visit: Payer: No Typology Code available for payment source | Attending: Internal Medicine | Admitting: Pharmacist

## 2013-09-25 DIAGNOSIS — Z5181 Encounter for therapeutic drug level monitoring: Secondary | ICD-10-CM | POA: Insufficient documentation

## 2013-09-25 DIAGNOSIS — I2699 Other pulmonary embolism without acute cor pulmonale: Secondary | ICD-10-CM | POA: Insufficient documentation

## 2013-09-25 DIAGNOSIS — Z7901 Long term (current) use of anticoagulants: Secondary | ICD-10-CM | POA: Insufficient documentation

## 2013-09-25 DIAGNOSIS — I82409 Acute embolism and thrombosis of unspecified deep veins of unspecified lower extremity: Secondary | ICD-10-CM

## 2013-09-25 LAB — POCT INR
INR: 1.4
INR: 1.4

## 2013-10-02 ENCOUNTER — Ambulatory Visit: Payer: No Typology Code available for payment source | Attending: Internal Medicine | Admitting: Pharmacist

## 2013-10-02 DIAGNOSIS — I82409 Acute embolism and thrombosis of unspecified deep veins of unspecified lower extremity: Secondary | ICD-10-CM

## 2013-10-02 DIAGNOSIS — Z5181 Encounter for therapeutic drug level monitoring: Secondary | ICD-10-CM

## 2013-10-02 LAB — POCT INR
INR: 1.4
INR: 1.4

## 2013-10-09 ENCOUNTER — Ambulatory Visit: Payer: No Typology Code available for payment source | Attending: Internal Medicine | Admitting: Pharmacist

## 2013-10-09 DIAGNOSIS — I8290 Acute embolism and thrombosis of unspecified vein: Secondary | ICD-10-CM

## 2013-10-09 DIAGNOSIS — I749 Embolism and thrombosis of unspecified artery: Secondary | ICD-10-CM

## 2013-10-09 LAB — POCT INR: INR: 1.6

## 2013-10-17 ENCOUNTER — Ambulatory Visit: Payer: No Typology Code available for payment source | Attending: Internal Medicine | Admitting: Pharmacist

## 2013-10-17 DIAGNOSIS — Z7901 Long term (current) use of anticoagulants: Secondary | ICD-10-CM | POA: Insufficient documentation

## 2013-10-17 DIAGNOSIS — Z5181 Encounter for therapeutic drug level monitoring: Secondary | ICD-10-CM | POA: Insufficient documentation

## 2013-10-17 DIAGNOSIS — I82409 Acute embolism and thrombosis of unspecified deep veins of unspecified lower extremity: Secondary | ICD-10-CM | POA: Insufficient documentation

## 2013-10-17 LAB — POCT INR
INR: 3.2
INR: 3.2

## 2013-10-24 ENCOUNTER — Ambulatory Visit: Payer: Self-pay | Admitting: Internal Medicine

## 2013-10-24 ENCOUNTER — Ambulatory Visit: Payer: No Typology Code available for payment source | Attending: Internal Medicine | Admitting: Pharmacist

## 2013-10-24 DIAGNOSIS — I82409 Acute embolism and thrombosis of unspecified deep veins of unspecified lower extremity: Secondary | ICD-10-CM | POA: Insufficient documentation

## 2013-10-24 DIAGNOSIS — Z7901 Long term (current) use of anticoagulants: Secondary | ICD-10-CM | POA: Insufficient documentation

## 2013-10-24 DIAGNOSIS — Z5181 Encounter for therapeutic drug level monitoring: Secondary | ICD-10-CM | POA: Insufficient documentation

## 2013-10-24 LAB — POCT INR: INR: 2.8

## 2013-10-24 MED ORDER — WARFARIN SODIUM 3 MG PO TABS: 6.0000 mg | ORAL_TABLET | Freq: Every day | ORAL | Status: DC

## 2013-10-31 ENCOUNTER — Ambulatory Visit: Payer: No Typology Code available for payment source | Attending: Internal Medicine | Admitting: Pharmacist

## 2013-10-31 DIAGNOSIS — I2699 Other pulmonary embolism without acute cor pulmonale: Secondary | ICD-10-CM

## 2013-10-31 DIAGNOSIS — Z5181 Encounter for therapeutic drug level monitoring: Secondary | ICD-10-CM

## 2013-10-31 DIAGNOSIS — I82409 Acute embolism and thrombosis of unspecified deep veins of unspecified lower extremity: Secondary | ICD-10-CM

## 2013-10-31 LAB — POCT INR: INR: 2.4

## 2013-10-31 NOTE — Patient Instructions (Signed)
Pt instructed to take 2 tablets daily and return next week

## 2013-11-07 ENCOUNTER — Ambulatory Visit: Payer: No Typology Code available for payment source | Attending: Internal Medicine | Admitting: Pharmacist

## 2013-11-07 DIAGNOSIS — I2699 Other pulmonary embolism without acute cor pulmonale: Secondary | ICD-10-CM

## 2013-11-07 DIAGNOSIS — Z5181 Encounter for therapeutic drug level monitoring: Secondary | ICD-10-CM

## 2013-11-07 DIAGNOSIS — I82409 Acute embolism and thrombosis of unspecified deep veins of unspecified lower extremity: Secondary | ICD-10-CM

## 2013-11-07 LAB — POCT INR: INR: 2.2

## 2013-11-14 ENCOUNTER — Encounter: Payer: Self-pay | Admitting: Pharmacist

## 2013-11-15 ENCOUNTER — Encounter: Payer: Self-pay | Admitting: Pharmacist

## 2013-11-16 ENCOUNTER — Ambulatory Visit (HOSPITAL_BASED_OUTPATIENT_CLINIC_OR_DEPARTMENT_OTHER): Payer: No Typology Code available for payment source | Admitting: Pharmacist

## 2013-11-16 DIAGNOSIS — I82409 Acute embolism and thrombosis of unspecified deep veins of unspecified lower extremity: Secondary | ICD-10-CM

## 2013-11-16 LAB — POCT INR: INR: 1.6

## 2013-11-16 MED ORDER — WARFARIN SODIUM 3 MG PO TABS: 6.0000 mg | ORAL_TABLET | Freq: Every day | ORAL | Status: DC

## 2013-11-16 MED ORDER — CYCLOBENZAPRINE HCL 10 MG PO TABS: 10.0000 mg | ORAL_TABLET | Freq: Every day | ORAL | Status: DC

## 2013-11-21 ENCOUNTER — Ambulatory Visit: Payer: No Typology Code available for payment source | Attending: Internal Medicine | Admitting: Pharmacist

## 2013-11-21 DIAGNOSIS — I82409 Acute embolism and thrombosis of unspecified deep veins of unspecified lower extremity: Secondary | ICD-10-CM

## 2013-11-21 LAB — POCT INR: INR: 1.7

## 2013-11-21 NOTE — Patient Instructions (Signed)
Return to office in one week for repeat INR Take 7.5mg  of coumadin today and resume with  6mg  of coumadin on Thursday 8.20.15

## 2013-11-28 ENCOUNTER — Other Ambulatory Visit: Payer: Self-pay

## 2013-11-28 ENCOUNTER — Ambulatory Visit: Payer: No Typology Code available for payment source | Attending: Internal Medicine | Admitting: Pharmacist

## 2013-11-28 DIAGNOSIS — I82409 Acute embolism and thrombosis of unspecified deep veins of unspecified lower extremity: Secondary | ICD-10-CM | POA: Insufficient documentation

## 2013-11-28 DIAGNOSIS — Z5181 Encounter for therapeutic drug level monitoring: Secondary | ICD-10-CM | POA: Insufficient documentation

## 2013-11-28 DIAGNOSIS — Z7901 Long term (current) use of anticoagulants: Secondary | ICD-10-CM | POA: Insufficient documentation

## 2013-11-28 LAB — POCT INR
INR: 1.9
INR: 1.9

## 2013-12-05 ENCOUNTER — Ambulatory Visit: Payer: No Typology Code available for payment source | Attending: Internal Medicine | Admitting: Pharmacist

## 2013-12-05 DIAGNOSIS — I82409 Acute embolism and thrombosis of unspecified deep veins of unspecified lower extremity: Secondary | ICD-10-CM

## 2013-12-05 DIAGNOSIS — Z7901 Long term (current) use of anticoagulants: Secondary | ICD-10-CM | POA: Insufficient documentation

## 2013-12-05 DIAGNOSIS — Z5181 Encounter for therapeutic drug level monitoring: Secondary | ICD-10-CM | POA: Insufficient documentation

## 2013-12-05 DIAGNOSIS — I2699 Other pulmonary embolism without acute cor pulmonale: Secondary | ICD-10-CM | POA: Insufficient documentation

## 2013-12-05 LAB — POCT INR: INR: 1.6

## 2013-12-05 MED ORDER — WARFARIN SODIUM 7.5 MG PO TABS: 7.5000 mg | ORAL_TABLET | Freq: Every day | ORAL | Status: DC

## 2013-12-12 ENCOUNTER — Ambulatory Visit: Payer: No Typology Code available for payment source | Attending: Internal Medicine | Admitting: Pharmacist

## 2013-12-12 DIAGNOSIS — Z5181 Encounter for therapeutic drug level monitoring: Secondary | ICD-10-CM

## 2013-12-12 DIAGNOSIS — I82409 Acute embolism and thrombosis of unspecified deep veins of unspecified lower extremity: Secondary | ICD-10-CM | POA: Insufficient documentation

## 2013-12-12 LAB — POCT INR: INR: 2.3

## 2013-12-14 ENCOUNTER — Ambulatory Visit: Payer: Self-pay | Admitting: Internal Medicine

## 2013-12-26 ENCOUNTER — Ambulatory Visit: Payer: No Typology Code available for payment source | Attending: Internal Medicine | Admitting: Pharmacist

## 2013-12-26 DIAGNOSIS — Z5181 Encounter for therapeutic drug level monitoring: Secondary | ICD-10-CM | POA: Insufficient documentation

## 2013-12-26 DIAGNOSIS — Z7901 Long term (current) use of anticoagulants: Secondary | ICD-10-CM | POA: Insufficient documentation

## 2013-12-26 DIAGNOSIS — I82409 Acute embolism and thrombosis of unspecified deep veins of unspecified lower extremity: Secondary | ICD-10-CM | POA: Insufficient documentation

## 2013-12-26 LAB — POCT INR: INR: 2.4

## 2014-01-01 ENCOUNTER — Other Ambulatory Visit: Payer: Self-pay | Admitting: Internal Medicine

## 2014-01-02 ENCOUNTER — Other Ambulatory Visit: Payer: Self-pay

## 2014-01-02 MED ORDER — WARFARIN SODIUM 7.5 MG PO TABS: 7.5000 mg | ORAL_TABLET | Freq: Every day | ORAL | Status: DC

## 2014-01-09 ENCOUNTER — Ambulatory Visit: Payer: No Typology Code available for payment source | Attending: Internal Medicine | Admitting: Pharmacist

## 2014-01-09 DIAGNOSIS — I82409 Acute embolism and thrombosis of unspecified deep veins of unspecified lower extremity: Secondary | ICD-10-CM | POA: Insufficient documentation

## 2014-01-09 DIAGNOSIS — Z5181 Encounter for therapeutic drug level monitoring: Secondary | ICD-10-CM

## 2014-01-09 LAB — POCT INR: INR: 2.2

## 2014-01-23 ENCOUNTER — Ambulatory Visit: Payer: No Typology Code available for payment source | Attending: Internal Medicine | Admitting: Pharmacist

## 2014-01-23 DIAGNOSIS — Z5181 Encounter for therapeutic drug level monitoring: Secondary | ICD-10-CM

## 2014-01-23 DIAGNOSIS — I2699 Other pulmonary embolism without acute cor pulmonale: Secondary | ICD-10-CM

## 2014-01-23 LAB — POCT INR: INR: 1.8

## 2014-02-06 ENCOUNTER — Ambulatory Visit: Payer: No Typology Code available for payment source | Attending: Internal Medicine | Admitting: Pharmacist

## 2014-02-06 DIAGNOSIS — Z5181 Encounter for therapeutic drug level monitoring: Secondary | ICD-10-CM

## 2014-02-06 DIAGNOSIS — I2699 Other pulmonary embolism without acute cor pulmonale: Secondary | ICD-10-CM

## 2014-02-06 LAB — POCT INR: INR: 2.2

## 2014-02-06 MED ORDER — WARFARIN SODIUM 7.5 MG PO TABS: 7.5000 mg | ORAL_TABLET | Freq: Every day | ORAL | Status: DC

## 2014-02-14 ENCOUNTER — Emergency Department (HOSPITAL_COMMUNITY): Payer: No Typology Code available for payment source

## 2014-02-14 ENCOUNTER — Encounter (HOSPITAL_COMMUNITY): Payer: Self-pay

## 2014-02-14 ENCOUNTER — Emergency Department (HOSPITAL_COMMUNITY)
Admission: EM | Admit: 2014-02-14 | Discharge: 2014-02-18 | Disposition: A | Discharge status: Other Acute Inpatient Hospital | Payer: No Typology Code available for payment source | Attending: Emergency Medicine | Admitting: Emergency Medicine

## 2014-02-14 DIAGNOSIS — Z86711 Personal history of pulmonary embolism: Secondary | ICD-10-CM | POA: Insufficient documentation

## 2014-02-14 DIAGNOSIS — R0789 Other chest pain: Secondary | ICD-10-CM | POA: Insufficient documentation

## 2014-02-14 DIAGNOSIS — Z872 Personal history of diseases of the skin and subcutaneous tissue: Secondary | ICD-10-CM | POA: Insufficient documentation

## 2014-02-14 DIAGNOSIS — Z86718 Personal history of other venous thrombosis and embolism: Secondary | ICD-10-CM | POA: Insufficient documentation

## 2014-02-14 DIAGNOSIS — Z79899 Other long term (current) drug therapy: Secondary | ICD-10-CM | POA: Insufficient documentation

## 2014-02-14 DIAGNOSIS — Z87891 Personal history of nicotine dependence: Secondary | ICD-10-CM | POA: Insufficient documentation

## 2014-02-14 DIAGNOSIS — F1410 Cocaine abuse, uncomplicated: Secondary | ICD-10-CM | POA: Diagnosis present

## 2014-02-14 DIAGNOSIS — Z87828 Personal history of other (healed) physical injury and trauma: Secondary | ICD-10-CM | POA: Insufficient documentation

## 2014-02-14 DIAGNOSIS — F333 Major depressive disorder, recurrent, severe with psychotic symptoms: Secondary | ICD-10-CM

## 2014-02-14 DIAGNOSIS — R45851 Suicidal ideations: Secondary | ICD-10-CM

## 2014-02-14 DIAGNOSIS — Z7901 Long term (current) use of anticoagulants: Secondary | ICD-10-CM | POA: Insufficient documentation

## 2014-02-14 DIAGNOSIS — T1491XA Suicide attempt, initial encounter: Secondary | ICD-10-CM

## 2014-02-14 DIAGNOSIS — I10 Essential (primary) hypertension: Secondary | ICD-10-CM | POA: Insufficient documentation

## 2014-02-14 DIAGNOSIS — R079 Chest pain, unspecified: Secondary | ICD-10-CM

## 2014-02-14 LAB — RAPID URINE DRUG SCREEN, HOSP PERFORMED
AMPHETAMINES: NOT DETECTED
Barbiturates: NOT DETECTED
Benzodiazepines: NOT DETECTED
Cocaine: POSITIVE — AB
OPIATES: NOT DETECTED
Tetrahydrocannabinol: NOT DETECTED

## 2014-02-14 LAB — I-STAT TROPONIN, ED
TROPONIN I, POC: 0 ng/mL (ref 0.00–0.08)
Troponin i, poc: 0 ng/mL (ref 0.00–0.08)

## 2014-02-14 LAB — CBC
HCT: 49.5 % (ref 39.0–52.0)
HEMOGLOBIN: 17.3 g/dL — AB (ref 13.0–17.0)
MCH: 30.1 pg (ref 26.0–34.0)
MCHC: 34.9 g/dL (ref 30.0–36.0)
MCV: 86.2 fL (ref 78.0–100.0)
Platelets: 213 10*3/uL (ref 150–400)
RBC: 5.74 MIL/uL (ref 4.22–5.81)
RDW: 13.9 % (ref 11.5–15.5)
WBC: 6.2 10*3/uL (ref 4.0–10.5)

## 2014-02-14 LAB — COMPREHENSIVE METABOLIC PANEL
ALT: 23 U/L (ref 0–53)
AST: 24 U/L (ref 0–37)
Albumin: 3.4 g/dL — ABNORMAL LOW (ref 3.5–5.2)
Alkaline Phosphatase: 49 U/L (ref 39–117)
Anion gap: 15 (ref 5–15)
BUN: 22 mg/dL (ref 6–23)
CALCIUM: 9.2 mg/dL (ref 8.4–10.5)
CO2: 21 mEq/L (ref 19–32)
CREATININE: 1.57 mg/dL — AB (ref 0.50–1.35)
Chloride: 101 mEq/L (ref 96–112)
GFR calc non Af Amer: 51 mL/min — ABNORMAL LOW (ref >90)
GFR, EST AFRICAN AMERICAN: 59 mL/min — AB (ref >90)
Glucose, Bld: 90 mg/dL (ref 70–99)
Potassium: 3.9 mEq/L (ref 3.7–5.3)
Sodium: 137 mEq/L (ref 137–147)
Total Bilirubin: 0.9 mg/dL (ref 0.3–1.2)
Total Protein: 6.3 g/dL (ref 6.0–8.3)

## 2014-02-14 LAB — PRO B NATRIURETIC PEPTIDE: PRO B NATRI PEPTIDE: 7 pg/mL (ref 0–125)

## 2014-02-14 LAB — PROTIME-INR
INR: 1.12 (ref 0.00–1.49)
Prothrombin Time: 14.5 seconds (ref 11.6–15.2)

## 2014-02-14 LAB — ETHANOL

## 2014-02-14 MED ORDER — IOHEXOL 350 MG/ML SOLN
100.0000 mL | Freq: Once | INTRAVENOUS | Status: AC | PRN
  Administered 2014-02-14: 100 mL via INTRAVENOUS

## 2014-02-14 MED ORDER — PRAZOSIN HCL 5 MG PO CAPS
5.0000 mg | ORAL_CAPSULE | Freq: Every day | ORAL | Status: DC
  Administered 2014-02-15 – 2014-02-17 (×3): 5 mg via ORAL
  Filled 2014-02-14 (×5): qty 1

## 2014-02-14 MED ORDER — DIPHENHYDRAMINE HCL 50 MG/ML IJ SOLN: 50.0000 mg | Freq: Once | INTRAMUSCULAR | Status: DC

## 2014-02-14 MED ORDER — QUETIAPINE FUMARATE 100 MG PO TABS
200.0000 mg | ORAL_TABLET | Freq: Every day | ORAL | Status: DC
  Administered 2014-02-14 – 2014-02-17 (×4): 200 mg via ORAL
  Filled 2014-02-14 (×4): qty 2

## 2014-02-14 MED ORDER — SODIUM CHLORIDE 0.9 % IV BOLUS (SEPSIS)
500.0000 mL | Freq: Once | INTRAVENOUS | Status: AC
  Administered 2014-02-14: 500 mL via INTRAVENOUS

## 2014-02-14 MED ORDER — LORAZEPAM 1 MG PO TABS
2.0000 mg | ORAL_TABLET | Freq: Once | ORAL | Status: AC
  Administered 2014-02-14: 2 mg via ORAL
  Filled 2014-02-14: qty 2

## 2014-02-14 MED ORDER — ZOLPIDEM TARTRATE 5 MG PO TABS: 5.0000 mg | ORAL_TABLET | Freq: Every evening | ORAL | Status: DC | PRN

## 2014-02-14 MED ORDER — CYCLOBENZAPRINE HCL 10 MG PO TABS
5.0000 mg | ORAL_TABLET | Freq: Three times a day (TID) | ORAL | Status: DC | PRN
  Administered 2014-02-17: 5 mg via ORAL
  Filled 2014-02-14: qty 1

## 2014-02-14 MED ORDER — NICOTINE 21 MG/24HR TD PT24
21.0000 mg | MEDICATED_PATCH | Freq: Every day | TRANSDERMAL | Status: DC
  Filled 2014-02-14: qty 1

## 2014-02-14 MED ORDER — LORAZEPAM 2 MG/ML IJ SOLN: 2.0000 mg | Freq: Once | INTRAMUSCULAR | Status: DC

## 2014-02-14 MED ORDER — CYCLOBENZAPRINE HCL 10 MG PO TABS
10.0000 mg | ORAL_TABLET | Freq: Every day | ORAL | Status: DC
  Administered 2014-02-14 – 2014-02-17 (×4): 10 mg via ORAL
  Filled 2014-02-14 (×4): qty 1

## 2014-02-14 MED ORDER — GABAPENTIN 300 MG PO CAPS
600.0000 mg | ORAL_CAPSULE | Freq: Three times a day (TID) | ORAL | Status: DC
  Administered 2014-02-14 – 2014-02-18 (×13): 600 mg via ORAL
  Filled 2014-02-14 (×13): qty 2
  Filled 2014-02-14: qty 6

## 2014-02-14 MED ORDER — WARFARIN - PHYSICIAN DOSING INPATIENT: Freq: Every day | Status: DC

## 2014-02-14 MED ORDER — ACETAMINOPHEN 325 MG PO TABS
650.0000 mg | ORAL_TABLET | ORAL | Status: DC | PRN
  Administered 2014-02-17: 650 mg via ORAL
  Filled 2014-02-14: qty 2

## 2014-02-14 MED ORDER — ONDANSETRON HCL 4 MG PO TABS: 4.0000 mg | ORAL_TABLET | Freq: Three times a day (TID) | ORAL | Status: DC | PRN

## 2014-02-14 MED ORDER — TRAMADOL HCL 50 MG PO TABS
50.0000 mg | ORAL_TABLET | Freq: Four times a day (QID) | ORAL | Status: DC | PRN
  Administered 2014-02-17 – 2014-02-18 (×3): 50 mg via ORAL
  Filled 2014-02-14 (×4): qty 1

## 2014-02-14 MED ORDER — LORAZEPAM 1 MG PO TABS: 1.0000 mg | ORAL_TABLET | Freq: Three times a day (TID) | ORAL | Status: DC | PRN

## 2014-02-14 MED ORDER — WARFARIN SODIUM 7.5 MG PO TABS
7.5000 mg | ORAL_TABLET | Freq: Every day | ORAL | Status: DC
  Administered 2014-02-14 – 2014-02-16 (×3): 7.5 mg via ORAL
  Filled 2014-02-14 (×5): qty 1

## 2014-02-14 MED ORDER — RISPERIDONE 2 MG PO TABS
2.0000 mg | ORAL_TABLET | Freq: Every day | ORAL | Status: DC
  Administered 2014-02-14 – 2014-02-18 (×5): 2 mg via ORAL
  Filled 2014-02-14 (×5): qty 1

## 2014-02-14 NOTE — BH Assessment (Signed)
[facility], NP. No appropriate [facility] beds available at this time. TTS to seek placement.

## 2014-02-14 NOTE — ED Notes (Signed)
Patient resting quietly with eyes closed. Respirations even and unlabored. No distress noted. Q 15 minute checks continue as ordered.

## 2014-02-14 NOTE — ED Notes (Signed)
Patient in bed resting at the beginning of this shift. Mood and affects sad and depressed. He endorsed having an "o.k" day. Denied SI/HI and denied Hallucinations. 1800 dosage on Coumadin given because it came late from Pharmacy per day shift RN Dawnaly. Patient received medication without any difficulty. Q 15 minute check continues as ordered to maintain safety.

## 2014-02-14 NOTE — ED Notes (Signed)
Bed: [facility] Expected date:  Expected time:  Means of arrival:  Comments: 

## 2014-02-14 NOTE — ED Notes (Signed)
Pt states that some friends dropped him off here, pt states hes having lots of anxiety and is hearing voices telling him to harm himself, he says that his friends told him that he was planning on cutting himself.

## 2014-02-14 NOTE — ED Notes (Signed)
Pt has in belonging bag:  Black [place] baseball hat, black zipp up jacket, black tennis shoes, black long sleeves shirt, blue jeans, blue boxers, black belt, blue pocket knife, black watch, black basketball shorts, black wallet, [place] driver lic, ebt card, social security card, [employer] [id number]

## 2014-02-14 NOTE — ED Notes (Signed)
TTS at bedside. 

## 2014-02-14 NOTE — Progress Notes (Signed)
[facility] Specialist Stacy,   Provided pt with upcoming appt information at [facility] on 11/18 at 9:30am. Patient [facility] Card expires on 02/23/14. Provided pt with another [employer] for re-enrollment and information on [employer] of the [place] for Outpatient Mental Health resources.

## 2014-02-14 NOTE — ED Notes (Signed)
Belongings moved to sappu
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
## 2014-02-15 ENCOUNTER — Encounter (HOSPITAL_COMMUNITY): Payer: Self-pay | Admitting: Psychiatry

## 2014-02-15 DIAGNOSIS — R45851 Suicidal ideations: Secondary | ICD-10-CM

## 2014-02-15 DIAGNOSIS — F1410 Cocaine abuse, uncomplicated: Secondary | ICD-10-CM | POA: Diagnosis present

## 2014-02-15 LAB — PROTIME-INR
INR: 1.08 (ref 0.00–1.49)
Prothrombin Time: 14.1 seconds (ref 11.6–15.2)

## 2014-02-15 MED ORDER — ENOXAPARIN SODIUM 120 MG/0.8ML ~~LOC~~ SOLN
120.0000 mg | SUBCUTANEOUS | Status: DC
  Administered 2014-02-15 – 2014-02-18 (×4): 120 mg via SUBCUTANEOUS
  Filled 2014-02-15 (×4): qty 0.8

## 2014-02-15 NOTE — ED Notes (Addendum)
Attempted lab draw x 2 but unsuccessful. Notified RN,Olofunso. Main lab called to draw blood.

## 2014-02-15 NOTE — ED Notes (Signed)
Patient states he "has lots of suicidal thoughts."  He has flat affect and poor eye contact.  He is depressed over his best friend dying this past Monday.  He reported using drugs on that day, specifically cocaine.  He also drank a "quart of beer" on Wednesday.  Patient is followed by [facility].  He also reports command hallucinations to harm himself.  He denies HI.

## 2014-02-15 NOTE — ED Notes (Signed)
Patient resting quietly with eyes closed. Respirations even and unlabored. No distress noted. Q 15 minute check continues to maintain safety   

## 2014-02-15 NOTE — ED Notes (Signed)
Patient awake, endorsed a restful night. Mood and affects still sad and depressed. Voiced no complaint. Q 15 minute check continues for safety.
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
## 2014-02-15 NOTE — ED Notes (Signed)
Patient came to walked to the bathroom within the hour. He appeared heavily sedated. After using the bathroom, writer assisted patient back to his room.Q 15 minute check continues to maintain safety.

## 2014-02-15 NOTE — ED Notes (Addendum)
Pt. Alert and oriented in no distress denies HI. Pt. States he is "seeing movements" and hearing "voices telling him to hurt himself". Will continue to monitor for safety. Pt. Instructed to come to me with problems or concerns. Q 15 minute checks continue.

## 2014-02-15 NOTE — ED Notes (Signed)
Report received from Caroline RN. Pt. Sleeping, respirations regular and unlabored. Will continue to monitor for safety. Q 15 minute checks continue. 

## 2014-02-15 NOTE — Progress Notes (Signed)
Pt has been assessed and meets inpatient criteria for treatment. Pt.'s clinicals faxed out to:  [facility]     Will continue to pursue placement.   Sarahi Borland, MSW Social Worker [phone]  

## 2014-02-16 ENCOUNTER — Encounter (HOSPITAL_COMMUNITY): Payer: Self-pay | Admitting: Psychiatry

## 2014-02-16 LAB — PROTIME-INR
INR: 1.11 (ref 0.00–1.49)
Prothrombin Time: 14.4 seconds (ref 11.6–15.2)
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
## 2014-02-16 NOTE — BH Assessment (Signed)
Dale [name], AC at [facility], confirms adult unit is at capacity. Contacted the following facilities for placement:  BED AVAILABLE, FAXED CLINICAL INFORMATION: [facility], per Jessica [facility], per [facility], per Misty [facility], per Latasha  AT CAPACITY: [facility], per Sue [facility], per [facility], per Nat[facility], per [facility], per [facility], per [facility], per Julie [facility], per [facility], per Sherry [facility], per Suzanne [facility], per Joe [facility], per [facility], per Kevin [facility], per Cathleen [facility], per Shannon  NO RESPONSE: [facility]   Dale [name] Dale [name], LPC, NCC Triage Specialist [phone]  

## 2014-02-16 NOTE — ED Notes (Signed)
[facility] Pt. Alert and oriented in no distress denies HI, and pain. Pt. States he is still hearing "voices telling me to leave and that I'm going to die" Will continue to monitor for safety. Pt. Instructed to come to me with problems or concerns. Q 15 minute checks continue.
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
## 2014-02-16 NOTE — BH Assessment (Signed)
Margarete at [facility] called to say Dr. [place] has declined Pt.  Dale [name], LPC, NCC Triage Specialist [phone]  

## 2014-02-17 ENCOUNTER — Encounter (HOSPITAL_COMMUNITY): Payer: Self-pay | Admitting: Registered Nurse

## 2014-02-17 DIAGNOSIS — R45851 Suicidal ideations: Secondary | ICD-10-CM | POA: Insufficient documentation

## 2014-02-17 DIAGNOSIS — F332 Major depressive disorder, recurrent severe without psychotic features: Secondary | ICD-10-CM

## 2014-02-17 LAB — PROTIME-INR
INR: 1.14 (ref 0.00–1.49)
Prothrombin Time: 14.7 seconds (ref 11.6–15.2)

## 2014-02-17 MED ORDER — WARFARIN - PHARMACIST DOSING INPATIENT: Freq: Every day | Status: DC

## 2014-02-17 MED ORDER — WARFARIN SODIUM 7.5 MG PO TABS
7.5000 mg | ORAL_TABLET | Freq: Every day | ORAL | Status: DC
  Filled 2014-02-17: qty 1

## 2014-02-17 MED ORDER — WARFARIN SODIUM 10 MG PO TABS
10.0000 mg | ORAL_TABLET | Freq: Once | ORAL | Status: AC
  Administered 2014-02-17: 10 mg via ORAL
  Filled 2014-02-17: qty 1
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
## 2014-02-17 NOTE — Progress Notes (Signed)
MHT completed follow-up at the following hospital where all referrals are still under review:  UNDER REVIEW 1)[facility] 2)[facility] 3)[facility] 5)[facility]  AT CAPACITY 1)[facility] 4)[facility]  NO RESPONSE/ANSWER 1)[facility]  Blain Pais, MHT/NS
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
## 2014-02-17 NOTE — ED Notes (Signed)
Report received from [facility]. Pt. Alert and oriented in no distress denies HI, AVH. Pt. States he is hearing voices telling him to "go, and to kill himself". Will continue to monitor for safety. Pt. Instructed to come to me with problems or concerns. Q 15 minute checks continue.

## 2014-02-18 ENCOUNTER — Inpatient Hospital Stay (HOSPITAL_COMMUNITY)
Admission: AD | Admit: 2014-02-18 | Discharge: 2014-02-27 | DRG: 885 | Disposition: A | Discharge status: Home / Self Care | Payer: Federal, State, Local not specified - Other | Source: Intra-hospital | Attending: Psychiatry | Admitting: Psychiatry

## 2014-02-18 ENCOUNTER — Encounter (HOSPITAL_COMMUNITY): Payer: Self-pay | Admitting: *Deleted

## 2014-02-18 DIAGNOSIS — Z87891 Personal history of nicotine dependence: Secondary | ICD-10-CM | POA: Diagnosis not present

## 2014-02-18 DIAGNOSIS — F1410 Cocaine abuse, uncomplicated: Secondary | ICD-10-CM

## 2014-02-18 DIAGNOSIS — Z86711 Personal history of pulmonary embolism: Secondary | ICD-10-CM

## 2014-02-18 DIAGNOSIS — F3160 Bipolar disorder, current episode mixed, unspecified: Secondary | ICD-10-CM | POA: Diagnosis present

## 2014-02-18 DIAGNOSIS — I82409 Acute embolism and thrombosis of unspecified deep veins of unspecified lower extremity: Secondary | ICD-10-CM

## 2014-02-18 DIAGNOSIS — I10 Essential (primary) hypertension: Secondary | ICD-10-CM | POA: Diagnosis present

## 2014-02-18 DIAGNOSIS — G629 Polyneuropathy, unspecified: Secondary | ICD-10-CM | POA: Diagnosis present

## 2014-02-18 DIAGNOSIS — F1420 Cocaine dependence, uncomplicated: Secondary | ICD-10-CM | POA: Diagnosis present

## 2014-02-18 DIAGNOSIS — F333 Major depressive disorder, recurrent, severe with psychotic symptoms: Secondary | ICD-10-CM

## 2014-02-18 DIAGNOSIS — F3164 Bipolar disorder, current episode mixed, severe, with psychotic features: Secondary | ICD-10-CM | POA: Diagnosis present

## 2014-02-18 DIAGNOSIS — Z79899 Other long term (current) drug therapy: Secondary | ICD-10-CM

## 2014-02-18 DIAGNOSIS — B353 Tinea pedis: Secondary | ICD-10-CM | POA: Diagnosis present

## 2014-02-18 DIAGNOSIS — R45851 Suicidal ideations: Secondary | ICD-10-CM | POA: Diagnosis present

## 2014-02-18 DIAGNOSIS — G8929 Other chronic pain: Secondary | ICD-10-CM | POA: Diagnosis present

## 2014-02-18 DIAGNOSIS — F4310 Post-traumatic stress disorder, unspecified: Secondary | ICD-10-CM | POA: Diagnosis present

## 2014-02-18 DIAGNOSIS — Z7901 Long term (current) use of anticoagulants: Secondary | ICD-10-CM | POA: Diagnosis not present

## 2014-02-18 DIAGNOSIS — F323 Major depressive disorder, single episode, severe with psychotic features: Secondary | ICD-10-CM | POA: Diagnosis present

## 2014-02-18 DIAGNOSIS — F29 Unspecified psychosis not due to a substance or known physiological condition: Secondary | ICD-10-CM | POA: Diagnosis present

## 2014-02-18 DIAGNOSIS — F4312 Post-traumatic stress disorder, chronic: Secondary | ICD-10-CM | POA: Diagnosis present

## 2014-02-18 DIAGNOSIS — Z86718 Personal history of other venous thrombosis and embolism: Secondary | ICD-10-CM | POA: Diagnosis not present

## 2014-02-18 DIAGNOSIS — M25474 Effusion, right foot: Secondary | ICD-10-CM

## 2014-02-18 DIAGNOSIS — M25476 Effusion, unspecified foot: Secondary | ICD-10-CM | POA: Insufficient documentation

## 2014-02-18 HISTORY — DX: Major depressive disorder, single episode, unspecified: F32.9

## 2014-02-18 HISTORY — DX: Depression, unspecified: F32.A

## 2014-02-18 HISTORY — DX: Anxiety disorder, unspecified: F41.9

## 2014-02-18 LAB — PROTIME-INR
INR: 1.21 (ref 0.00–1.49)
Prothrombin Time: 15.4 seconds — ABNORMAL HIGH (ref 11.6–15.2)

## 2014-02-18 MED ORDER — ALUM & MAG HYDROXIDE-SIMETH 200-200-20 MG/5ML PO SUSP: 30.0000 mL | ORAL | Status: DC | PRN

## 2014-02-18 MED ORDER — MAGNESIUM HYDROXIDE 400 MG/5ML PO SUSP
30.0000 mL | Freq: Every day | ORAL | Status: DC | PRN
  Administered 2014-02-25: 30 mL via ORAL
  Filled 2014-02-18: qty 30

## 2014-02-18 MED ORDER — PAROXETINE HCL 20 MG PO TABS: 20.0000 mg | ORAL_TABLET | Freq: Every day | ORAL | Status: DC

## 2014-02-18 MED ORDER — QUETIAPINE FUMARATE 400 MG PO TABS
400.0000 mg | ORAL_TABLET | Freq: Every day | ORAL | Status: DC
  Filled 2014-02-18 (×2): qty 1

## 2014-02-18 MED ORDER — WARFARIN SODIUM 10 MG PO TABS
10.0000 mg | ORAL_TABLET | Freq: Once | ORAL | Status: AC
  Administered 2014-02-18: 10 mg via ORAL
  Filled 2014-02-18: qty 1

## 2014-02-18 MED ORDER — PRAZOSIN HCL 5 MG PO CAPS
5.0000 mg | ORAL_CAPSULE | Freq: Every day | ORAL | Status: DC
Start: 2014-02-18 — End: 2014-02-18

## 2014-02-18 MED ORDER — ENOXAPARIN SODIUM 150 MG/ML ~~LOC~~ SOLN
140.0000 mg | SUBCUTANEOUS | Status: DC
  Administered 2014-02-19 – 2014-02-21 (×3): 140 mg via SUBCUTANEOUS
  Filled 2014-02-18 (×5): qty 1

## 2014-02-18 MED ORDER — WARFARIN - PHARMACIST DOSING INPATIENT
Freq: Every day | Status: DC
  Administered 2014-02-23 – 2014-02-26 (×3)
  Filled 2014-02-18 (×16): qty 1

## 2014-02-18 MED ORDER — ENOXAPARIN SODIUM 120 MG/0.8ML ~~LOC~~ SOLN: 120.0000 mg | SUBCUTANEOUS | Status: DC

## 2014-02-18 MED ORDER — ALUM & MAG HYDROXIDE-SIMETH 200-200-20 MG/5ML PO SUSP
30.0000 mL | ORAL | Status: DC | PRN
  Administered 2014-02-21: 30 mL via ORAL
  Filled 2014-02-18: qty 30

## 2014-02-18 MED ORDER — TRAZODONE HCL 100 MG PO TABS: 200.0000 mg | ORAL_TABLET | Freq: Every day | ORAL | Status: DC

## 2014-02-18 MED ORDER — PAROXETINE HCL 20 MG PO TABS
20.0000 mg | ORAL_TABLET | Freq: Every day | ORAL | Status: DC
  Administered 2014-02-19: 20 mg via ORAL
  Filled 2014-02-18 (×2): qty 1
  Filled 2014-02-18: qty 2

## 2014-02-18 MED ORDER — GABAPENTIN 300 MG PO CAPS
600.0000 mg | ORAL_CAPSULE | Freq: Three times a day (TID) | ORAL | Status: DC
  Administered 2014-02-18 – 2014-02-27 (×27): 600 mg via ORAL
  Filled 2014-02-18 (×6): qty 2
  Filled 2014-02-18: qty 84
  Filled 2014-02-18 (×2): qty 2
  Filled 2014-02-18: qty 84
  Filled 2014-02-18: qty 2
  Filled 2014-02-18: qty 84
  Filled 2014-02-18 (×12): qty 2
  Filled 2014-02-18: qty 84
  Filled 2014-02-18: qty 2
  Filled 2014-02-18: qty 84
  Filled 2014-02-18 (×6): qty 2
  Filled 2014-02-18: qty 84
  Filled 2014-02-18 (×6): qty 2

## 2014-02-18 MED ORDER — CYCLOBENZAPRINE HCL 10 MG PO TABS
10.0000 mg | ORAL_TABLET | Freq: Every day | ORAL | Status: DC
  Administered 2014-02-18 – 2014-02-26 (×9): 10 mg via ORAL
  Filled 2014-02-18: qty 14
  Filled 2014-02-18: qty 1
  Filled 2014-02-18: qty 2
  Filled 2014-02-18 (×3): qty 1
  Filled 2014-02-18: qty 14
  Filled 2014-02-18 (×5): qty 1
  Filled 2014-02-18: qty 2
  Filled 2014-02-18 (×2): qty 1

## 2014-02-18 MED ORDER — TRAZODONE HCL 100 MG PO TABS
200.0000 mg | ORAL_TABLET | Freq: Every day | ORAL | Status: DC
  Administered 2014-02-18 – 2014-02-21 (×4): 200 mg via ORAL
  Filled 2014-02-18 (×7): qty 2

## 2014-02-18 MED ORDER — QUETIAPINE FUMARATE 300 MG PO TABS: 400.0000 mg | ORAL_TABLET | Freq: Every day | ORAL | Status: DC

## 2014-02-18 MED ORDER — TRAMADOL HCL 50 MG PO TABS
50.0000 mg | ORAL_TABLET | Freq: Four times a day (QID) | ORAL | Status: DC | PRN
  Administered 2014-02-18 – 2014-02-19 (×2): 50 mg via ORAL
  Filled 2014-02-18 (×2): qty 1

## 2014-02-18 MED ORDER — PRAZOSIN HCL 5 MG PO CAPS
5.0000 mg | ORAL_CAPSULE | Freq: Every day | ORAL | Status: DC
  Administered 2014-02-18: 5 mg via ORAL
  Filled 2014-02-18 (×4): qty 1

## 2014-02-18 MED ORDER — TRAMADOL HCL 50 MG PO TABS: 50.0000 mg | ORAL_TABLET | Freq: Four times a day (QID) | ORAL | Status: DC | PRN

## 2014-02-18 MED ORDER — GABAPENTIN 300 MG PO CAPS: 600.0000 mg | ORAL_CAPSULE | Freq: Three times a day (TID) | ORAL | Status: DC

## 2014-02-18 MED ORDER — ENOXAPARIN SODIUM 120 MG/0.8ML ~~LOC~~ SOLN
120.0000 mg | SUBCUTANEOUS | Status: DC
  Filled 2014-02-18: qty 0.8

## 2014-02-18 MED ORDER — CYCLOBENZAPRINE HCL 10 MG PO TABS
5.0000 mg | ORAL_TABLET | Freq: Three times a day (TID) | ORAL | Status: DC | PRN
  Administered 2014-02-22 – 2014-02-26 (×9): 5 mg via ORAL
  Filled 2014-02-18 (×5): qty 1
  Filled 2014-02-18 (×2): qty 10
  Filled 2014-02-18 (×5): qty 1

## 2014-02-18 MED ORDER — LORAZEPAM 1 MG PO TABS
1.0000 mg | ORAL_TABLET | Freq: Three times a day (TID) | ORAL | Status: DC | PRN
  Administered 2014-02-19 – 2014-02-27 (×7): 1 mg via ORAL
  Filled 2014-02-18 (×7): qty 1

## 2014-02-18 MED ORDER — ACETAMINOPHEN 325 MG PO TABS
650.0000 mg | ORAL_TABLET | Freq: Four times a day (QID) | ORAL | Status: DC | PRN
  Administered 2014-02-22 – 2014-02-26 (×11): 650 mg via ORAL
  Filled 2014-02-18 (×11): qty 2

## 2014-02-18 MED ORDER — MAGNESIUM HYDROXIDE 400 MG/5ML PO SUSP: 30.0000 mL | Freq: Every day | ORAL | Status: DC | PRN

## 2014-02-18 MED ORDER — ONDANSETRON HCL 4 MG PO TABS: 4.0000 mg | ORAL_TABLET | Freq: Three times a day (TID) | ORAL | Status: DC | PRN

## 2014-02-18 MED ORDER — DIPHENHYDRAMINE HCL 50 MG/ML IJ SOLN
50.0000 mg | Freq: Once | INTRAMUSCULAR | Status: DC
  Filled 2014-02-18: qty 1

## 2014-02-18 MED ORDER — HALOPERIDOL LACTATE 5 MG/ML IJ SOLN
10.0000 mg | Freq: Once | INTRAMUSCULAR | Status: DC
  Filled 2014-02-18: qty 2
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
## 2014-02-18 NOTE — Progress Notes (Signed)
Adult Psychoeducational Group Note  Date:  02/18/2014 Time:  10:23 PM  Group Topic/Focus:  Wrap-Up Group:   The focus of this group is to help patients review their daily goal of treatment and discuss progress on daily workbooks.  Participation Level:  Minimal  Participation Quality:  Appropriate  Affect:  Blunted  Cognitive:  Oriented  Insight: Limited  Engagement in Group:  Limited  Modes of Intervention:  Socialization and Support  Additional Comments:  Patient attended and participated in group tonight. He reports having a good day. He has been in [facility] for the past four days. He was transferred to [facility] today. He advised that he was glad to be at [facility]	[place] 02/18/2014, 10:23 PM
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
## 2014-02-18 NOTE — Progress Notes (Signed)
ANTICOAGULATION CONSULT NOTE  See progress note from Perlie Golderri Green, PharmD for full details.  Warfarin 10mg  [facility]. Pharmacy is now consulted to adjust Lovenox dose.  Current weight: 93kg CrCl: 66 ml/min   Plan:  Increase Lovenox to 140mg  sq q24h (1.5 mg/kg 24h) until INR is therapeutic.  Daily PT/INR  Follow renal function, signs/symptoms of bleeding  Provide warfarin prior to discharge  Dale PacasErin Tymarion [name], PharmD, BCPS Pager: [phone] 02/18/2014 5:22 PM
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
## 2014-02-18 NOTE — Progress Notes (Signed)
D: Pt denies HI/VH. Pt is pleasant and cooperative. Pt +ve for SI and AH- command sometimes. Pt stated he started binging after his mom's friend died. Pt refused to take his Seroquel due to how it makes him feel the next day.    A: Pt was offered support and encouragement. Pt was given scheduled medications. Pt was encourage to attend groups. Q 15 minute checks were done for safety.   R:Pt attends groups and interacts well with peers and staff. Pt is taking medication. Pt has no complaints at this time.Pt receptive to treatment and safety maintained on unit.

## 2014-02-18 NOTE — Tx Team (Signed)
Initial Interdisciplinary Treatment Plan   PATIENT STRESSORS: Financial difficulties Health problems Marital or family conflict Occupational concerns Substance abuse   PATIENT STRENGTHS: Ability for insight Average or above average intelligence Communication skills Motivation for treatment/growth Physical Health   PROBLEM LIST: Problem List/Patient Goals Date to be addressed Date deferred Reason deferred Estimated date of resolution  "suicidal ideation" 02/18/2014   D/c        "substance abuse" 02/18/2014   D/c        "depression" 02/18/2014   D/c                           DISCHARGE CRITERIA:  Ability to meet basic life and health needs Adequate post-discharge living arrangements Improved stabilization in mood, thinking, and/or behavior Medical problems require only outpatient monitoring Motivation to continue treatment in a less acute level of care Need for constant or close observation no longer present Reduction of life-threatening or endangering symptoms to within safe limits Safe-care adequate arrangements made Verbal commitment to aftercare and medication compliance Withdrawal symptoms are absent or subacute and managed without 24-hour nursing intervention  PRELIMINARY DISCHARGE PLAN: Attend aftercare/continuing care group Attend PHP/IOP Attend 12-step recovery group Outpatient therapy Participate in family therapy Placement in alternative living arrangements  PATIENT/FAMIILY INVOLVEMENT: This treatment plan has been presented to and reviewed with the patient, Elai Farrer, patient wants treatement for drug abuse.  The patient and family have been given the opportunity to ask questions and make suggestions.  Earline Mayotte 02/18/2014, 7:06 PM

## 2014-02-18 NOTE — ED Notes (Signed)
Report called to Drenda Freeze, RN at [facility].  [employer] notified of need for transport.  Patient aware he is being moved.

## 2014-02-18 NOTE — ED Notes (Signed)
[facility] in [employer] [facility].  He left the unit ambulatory.  All belongings given to the transport officers.
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
## 2014-02-18 NOTE — ED Provider Notes (Signed)
Pt stable awaiting placement  Dale Lennert, MD 02/18/14 [phone]

## 2014-02-19 ENCOUNTER — Encounter (HOSPITAL_COMMUNITY): Payer: Self-pay | Admitting: Psychiatry

## 2014-02-19 DIAGNOSIS — F4310 Post-traumatic stress disorder, unspecified: Secondary | ICD-10-CM | POA: Diagnosis present

## 2014-02-19 DIAGNOSIS — F1290 Cannabis use, unspecified, uncomplicated: Secondary | ICD-10-CM

## 2014-02-19 DIAGNOSIS — F3160 Bipolar disorder, current episode mixed, unspecified: Secondary | ICD-10-CM | POA: Diagnosis present

## 2014-02-19 DIAGNOSIS — F1420 Cocaine dependence, uncomplicated: Secondary | ICD-10-CM | POA: Diagnosis present

## 2014-02-19 LAB — CBC WITH DIFFERENTIAL/PLATELET
Basophils Absolute: 0 10*3/uL (ref 0.0–0.1)
Basophils Relative: 1 % (ref 0–1)
Eosinophils Absolute: 0.2 10*3/uL (ref 0.0–0.7)
Eosinophils Relative: 5 % (ref 0–5)
HCT: 43.6 % (ref 39.0–52.0)
HEMOGLOBIN: 14.8 g/dL (ref 13.0–17.0)
LYMPHS ABS: 1.7 10*3/uL (ref 0.7–4.0)
LYMPHS PCT: 39 % (ref 12–46)
MCH: 29.8 pg (ref 26.0–34.0)
MCHC: 33.9 g/dL (ref 30.0–36.0)
MCV: 87.9 fL (ref 78.0–100.0)
MONOS PCT: 8 % (ref 3–12)
Monocytes Absolute: 0.3 10*3/uL (ref 0.1–1.0)
NEUTROS ABS: 2.2 10*3/uL (ref 1.7–7.7)
NEUTROS PCT: 49 % (ref 43–77)
Platelets: 216 10*3/uL (ref 150–400)
RBC: 4.96 MIL/uL (ref 4.22–5.81)
RDW: 13.8 % (ref 11.5–15.5)
WBC: 4.5 10*3/uL (ref 4.0–10.5)

## 2014-02-19 LAB — PROTIME-INR
INR: 1.67 — AB (ref 0.00–1.49)
PROTHROMBIN TIME: 19.8 s — AB (ref 11.6–15.2)

## 2014-02-19 MED ORDER — ENSURE COMPLETE PO LIQD
237.0000 mL | Freq: Three times a day (TID) | ORAL | Status: DC
  Administered 2014-02-19 – 2014-02-27 (×23): 237 mL via ORAL

## 2014-02-19 MED ORDER — WARFARIN SODIUM 7.5 MG PO TABS
7.5000 mg | ORAL_TABLET | Freq: Once | ORAL | Status: AC
  Administered 2014-02-19: 7.5 mg via ORAL
  Filled 2014-02-19: qty 1

## 2014-02-19 MED ORDER — CLOTRIMAZOLE-BETAMETHASONE 1-0.05 % EX CREA: TOPICAL_CREAM | Freq: Two times a day (BID) | CUTANEOUS | Status: DC

## 2014-02-19 MED ORDER — PAROXETINE HCL 10 MG PO TABS: 10.0000 mg | ORAL_TABLET | Freq: Every day | ORAL | Status: DC

## 2014-02-19 MED ORDER — CLOTRIMAZOLE 1 % EX CREA
TOPICAL_CREAM | Freq: Two times a day (BID) | CUTANEOUS | Status: DC
  Administered 2014-02-19 – 2014-02-25 (×9): via TOPICAL
  Filled 2014-02-19 (×2): qty 15

## 2014-02-19 MED ORDER — PRAZOSIN HCL 2 MG PO CAPS
4.0000 mg | ORAL_CAPSULE | Freq: Every day | ORAL | Status: DC
  Administered 2014-02-20: 4 mg via ORAL
  Filled 2014-02-19 (×4): qty 2

## 2014-02-19 MED ORDER — HALOPERIDOL 5 MG PO TABS
5.0000 mg | ORAL_TABLET | Freq: Two times a day (BID) | ORAL | Status: DC
  Administered 2014-02-19 – 2014-02-20 (×2): 5 mg via ORAL
  Filled 2014-02-19 (×6): qty 1

## 2014-02-19 MED ORDER — TRAMADOL HCL 50 MG PO TABS
50.0000 mg | ORAL_TABLET | Freq: Two times a day (BID) | ORAL | Status: DC | PRN
  Administered 2014-02-20 – 2014-02-26 (×12): 50 mg via ORAL
  Filled 2014-02-19 (×13): qty 1

## 2014-02-19 MED ORDER — LAMOTRIGINE 25 MG PO TABS
25.0000 mg | ORAL_TABLET | Freq: Every day | ORAL | Status: DC
  Administered 2014-02-19 – 2014-02-20 (×2): 25 mg via ORAL
  Filled 2014-02-19 (×4): qty 1

## 2014-02-19 MED ORDER — BENZTROPINE MESYLATE 0.5 MG PO TABS
0.5000 mg | ORAL_TABLET | Freq: Two times a day (BID) | ORAL | Status: DC
  Administered 2014-02-19 – 2014-02-20 (×2): 0.5 mg via ORAL
  Filled 2014-02-19 (×6): qty 1

## 2014-02-19 NOTE — Progress Notes (Signed)
ANTICOAGULATION CONSULT NOTE - Follow Up Consult  Pharmacy Consult for Warfarin/Lovenox Indication: History of DVT/PE  Allergies  Allergen Reactions  . Ibuprofen Hives    Patient Measurements: Height: 5' 9.29" (176 cm) Weight: 206 lb (93.441 kg) IBW/kg (Calculated) : 71.37   Vital Signs: Temp: 97.9 F (36.6 C) (11/17 0830) BP: 76/38 mmHg (11/17 0831) Pulse Rate: 104 (11/17 0831)  Labs:  Recent Labs  02/17/14 0550 02/18/14 0430 02/19/14 0655  HGB  --   --  14.8  HCT  --   --  43.6  PLT  --   --  216  LABPROT 14.7 15.4* 19.8*  INR 1.14 1.21 1.67*    Estimated Creatinine Clearance: 66 mL/min (by C-G formula based on Cr of 1.57).   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 Patient 's home Warfarin dose is 7.5 mg daily with therapeutic INR on 11/4 on same dose. Last dose taken at home was 11/8, and was resumed on admission, 11/12.  11/16: Patient experienced minor nose bleed that was stopped by nursing staff quickly.  Today, 11/17  INR = 1.67  CBC WNL  Diet: Regular  Drug Interactions: None  Goal of Therapy:  INR 2-3 Monitor platelets by anticoagulation protocol: Yes   Plan:  1. Reduce warfarin dose to 7.5 mg x 1 today 2. Continue Lovenox 140 mg SQ daily until INR > 2 3. Monitor daily PT/INR, drug interactions, PO intake 4. Monitor CBC and s/sx of bleeding  Sherie DonMichael Rollins, PharmD Candidate  Charyl Dancerayne, Tomeshia Pizzi Marie, [place]  02/19/2014,9:09 AM

## 2014-02-19 NOTE — [facility] Group Notes (Signed)
[facility] LCSW Group Therapy  02/19/2014 , 2:55 PM   Type of Therapy:  Group Therapy  Participation Level: Did not attend  Summary of Progress/Problems: Today's group focused on the term Diagnosis.  Participants were asked to define the term, and then pronounce whether it is a negative, positive or neutral term.  Daryel Geraldorth, Lexxie Winberg B 02/19/2014 , 2:55 PM

## 2014-02-19 NOTE — Progress Notes (Signed)
Patient ID: Dale [name], male   DOB: 01/12/1966, 48 y.o.   MRN: [id number] D: client visible on the unit, interacts appropriately with staff and peers. Client reports a better day, but had some concerns about low BP. A: Writer reviewed medications, noted that Minipress side effects are hypotension, encouraged client to increase fluids to increase blood volume and rise slowly to prevent falls. Writer staffed with Dianna Limbo, PA, see new orders with parameters for minipress hold with SBP <100. Staff will monitor q30min for safety. R: Client is safe on the unit, refused hs minipress.

## 2014-02-19 NOTE — Plan of Care (Signed)
Problem: Alteration in mood & ability to function due to Goal: STG-Patient will attend groups Outcome: Progressing Patient is attending groups at this time.
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
## 2014-02-19 NOTE — [facility] Group Notes (Signed)
Adult Psychoeducational Group Note  Date:  02/19/2014 Time:  10:04 PM  Group Topic/Focus:  Wrap-Up Group:   The focus of this group is to help patients review their daily goal of treatment and discuss progress on daily workbooks.  Participation Level:  Active  Participation Quality:  Appropriate  Affect:  Appropriate  Cognitive:  Appropriate  Insight: Appropriate  Engagement in Group:  Engaged  Modes of Intervention:  Discussion  Additional Comments:  Dale [name] stated his day started off irritable.  He expressed he's been up all day but he's in a new room and started on new medication.  He also talked to his counselor and has no discharge plans yet.  His support system is his family.  Dale [name] A 02/19/2014, 10:04 PM
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
## 2014-02-19 NOTE — Progress Notes (Signed)
Patient ID: Dale [name], male   DOB: 02-22-1966, 48 y.o.   MRN: [id number]  D: Pt. Endorses SI but reports he can contract for safety. Patient reports he has HI towards another patient on the hall however denies a plan and when asked later now denies. Patient reports that he did not sleep well due to his roommate. Patient appears tired in groups. Denies visual Hallucinations to this Clinical research associatewriter. Patient reports pain in right leg and received PRN Tramadol. Patient received PRN Ativan as well for anxiety/agitation. Patient rates his depression and anxiety at 8/10 for the day. Patient rates hopelessness at 5/10 for the day.  A: Support and encouragement provided to the patient to speak with MD and treatment team about needs. Scheduled medications administered to patient per physician's orders.  R: Patient is receptive and cooperative with Clinical research associatewriter however is preoccupied with medication. Patient is seen in the milieu and is attending groups. Q15 minute checks are maintained for safety.

## 2014-02-19 NOTE — [facility] Group Notes (Signed)
[facility] Group Notes:  (Nursing/MHT/Case Management/Adjunct)  Date:  02/19/2014  Time:  1:54 PM  Type of Therapy:  Nurse Education  Participation Level:  Minimal  Participation Quality:  Drowsy  Affect:  Flat  Cognitive:  Appropriate  Insight:  Lacking  Engagement in Group:  Poor  Modes of Intervention:  Discussion and Education  Summary of Progress/Problems: This group was to discuss recovery and the road to recovery. Coming up with goals to reach the patient's own definition of recovery. The patient fell asleep during group and appeared drowsy when awake.  Marzetta Board E 02/19/2014, 1:54 PM

## 2014-02-20 ENCOUNTER — Encounter: Payer: Self-pay | Admitting: Pharmacist

## 2014-02-20 LAB — LIPID PANEL
CHOL/HDL RATIO: 4 ratio
Cholesterol: 182 mg/dL (ref 0–200)
HDL: 45 mg/dL (ref >39)
LDL CALC: 75 mg/dL (ref 0–99)
Triglycerides: 312 mg/dL — ABNORMAL HIGH (ref <150)
VLDL: 62 mg/dL — ABNORMAL HIGH (ref 0–40)

## 2014-02-20 LAB — PROTIME-INR
INR: 1.59 — ABNORMAL HIGH (ref 0.00–1.49)
PROTHROMBIN TIME: 19.1 s — AB (ref 11.6–15.2)

## 2014-02-20 LAB — HEMOGLOBIN A1C
HEMOGLOBIN A1C: 6.2 % — AB (ref <5.7)
Mean Plasma Glucose: 131 mg/dL — ABNORMAL HIGH (ref <117)

## 2014-02-20 LAB — TSH: TSH: 2.27 u[IU]/mL (ref 0.350–4.500)

## 2014-02-20 MED ORDER — SIMVASTATIN 20 MG PO TABS
20.0000 mg | ORAL_TABLET | Freq: Every day | ORAL | Status: DC
  Administered 2014-02-20 – 2014-02-26 (×7): 20 mg via ORAL
  Filled 2014-02-20 (×2): qty 1
  Filled 2014-02-20: qty 14
  Filled 2014-02-20 (×3): qty 1
  Filled 2014-02-20: qty 14
  Filled 2014-02-20 (×4): qty 1

## 2014-02-20 MED ORDER — WARFARIN SODIUM 2.5 MG PO TABS
12.5000 mg | ORAL_TABLET | Freq: Once | ORAL | Status: AC
  Administered 2014-02-20: 12.5 mg via ORAL
  Filled 2014-02-20: qty 1

## 2014-02-20 MED ORDER — LAMOTRIGINE 25 MG PO TABS
25.0000 mg | ORAL_TABLET | Freq: Two times a day (BID) | ORAL | Status: DC
  Administered 2014-02-20 – 2014-02-22 (×4): 25 mg via ORAL
  Filled 2014-02-20 (×8): qty 1

## 2014-02-20 NOTE — [facility] Group Notes (Signed)
[facility] LCSW Group Therapy  02/20/2014 1:31 PM  Type of Therapy:  Group Therapy  Participation Level:  Did Not Attend  Summary of Progress/Problems: [name] came to talk about his personal journey with substance abuse and mental illness. The pt processed ways by which to relate to the speaker. [name] provided handouts and educational information pertaining to groups and services offered by [facility].    Smart, Jalyn Dutta LCSWA 02/20/2014, 1:31 PM
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
## 2014-02-20 NOTE — Evaluation (Signed)
Physical Therapy Evaluation Patient Details Name: Dale [name] MRN: [id number] DOB: 06-26-1965 Today's Date: 02/20/2014   History of Present Illness  48 yo male admitted with opiate dependence.   Clinical Impression  On eval, pt is supervision level for mobility-able to ambulate ~100 feet with use of straight cane. Pt reports history of multiple surgeries on R foot (last being in 2008?). Gait abnormality is due to chronic pain and is pt's baseline. However pt does report lightheadedness/dizziness at times. Assessed BP: sitting-125/79, standing-103/70, after ambulation-106/64. Pt reports meds have been changed since admission. Change in meds and/or drop in BP with position change could be contributing to pt's feeling of dizziness. Educated pt on changing positions slowly and resting/sitting when dizziness occurs. Pt does not have any acute PT needs. 1x eval. Will sign off.     Follow Up Recommendations No PT follow up    Equipment Recommendations  None recommended by PT    Recommendations for Other Services       Precautions / Restrictions Precautions Precautions: Fall Precaution Comments: monitor BP Restrictions Weight Bearing Restrictions: No      Mobility  Bed Mobility Overal bed mobility: Independent                Transfers Overall transfer level: Independent                  Ambulation/Gait Ambulation/Gait assistance: Supervision Ambulation Distance (Feet): 100 Feet Assistive device: Straight cane Gait Pattern/deviations: Step-through pattern;Antalgic     General Gait Details: steady with use of cane. Gait is antalgic but this is pt's baseline due to chronic pain  Stairs            Wheelchair Mobility    Modified Rankin (Stroke Patients Only)       Balance                                             Pertinent Vitals/Pain Pain Assessment: Faces Pain Score: 6  Pain Location: R foot-chronic Pain Intervention(s):  Monitored during session    Home Living Family/patient expects to be discharged to:: Private residence             Home Layout: One level Home Equipment: Cane - single point      Prior Function Level of Independence: Independent with assistive device(s)         Comments: uses cane due to history of multiple surgeries on R foot     Hand Dominance        Extremity/Trunk Assessment   Upper Extremity Assessment: Overall WFL for tasks assessed           Lower Extremity Assessment: Overall WFL for tasks assessed      Cervical / Trunk Assessment: Normal  Communication   Communication: No difficulties  Cognition Arousal/Alertness: Awake/alert Behavior During Therapy: WFL for tasks assessed/performed Overall Cognitive Status: Within Functional Limits for tasks assessed                      General Comments      Exercises        Assessment/Plan    PT Assessment Patent does not need any further PT services  PT Diagnosis Difficulty walking   PT Problem List    PT Treatment Interventions     PT Goals (Current goals can be found in the Care  Plan section) Acute Rehab PT Goals Patient Stated Goal: "to get meds straightened out" PT Goal Formulation: All assessment and education complete, DC therapy    Frequency     Barriers to discharge        Co-evaluation               End of Session Equipment Utilized During Treatment: Gait belt Activity Tolerance: Patient tolerated treatment well Patient left: in bed;with call bell/phone within reach      Functional Assessment Tool Used: clinical judgement Functional Limitation: Mobility: Walking and moving around Mobility: Walking and Moving Around Current Status ([id number](G8978): At least 1 percent but less than 20 percent impaired, limited or restricted Mobility: Walking and Moving Around Goal Status [phone](G8979): At least 1 percent but less than 20 percent impaired, limited or restricted Mobility: Walking  and Moving Around Discharge Status [phone](G8980): At least 1 percent but less than 20 percent impaired, limited or restricted    Time: 1426-1435 PT Time Calculation (min) (ACUTE ONLY): 9 min   Charges:   PT Evaluation $Initial PT Evaluation Tier I: 1 Procedure PT Treatments $Gait Training: 8-22 mins   PT G Codes:   Functional Assessment Tool Used: clinical judgement Functional Limitation: Mobility: Walking and moving around    R.R. DonnelleyJannie Nayelis Bonito, MPT Pager: [phone]

## 2014-02-20 NOTE — Progress Notes (Signed)
Pt refused evening dose of haldol and cogentin. Per pt, haldol caused him to feel drowsy earlier and he do not want to be sleeping during the day. Writer encourage pt to talk to MD in am about ordering med for bedtime.

## 2014-02-20 NOTE — [facility] Group Notes (Signed)
[facility] LCSW Aftercare Discharge Planning Group Note   02/20/2014 2:56 PM  Participation Quality:  Appropriate   Mood/Affect: Anxious   Depression Rating:  0  Anxiety Rating:  7  Thoughts of Suicide:  No Will you contract for safety?   NA  Current AVH:  No  Plan for Discharge/Comments:  Pt reports no AVH or SI today. He plans to stay with a friend at d/c. Pt reports 3 1/2 hours of sleep. He follows up at [facility] for med management/services. Pt reports being light headed from medications.   Transportation Means: bus pass or friend   Supports: friend   Counselling psychologist, Oncologist

## 2014-02-20 NOTE — Progress Notes (Signed)
Pt presents with flat affect and depressed mood. Pt rates depression 6/10, anxiety 8/10 and hopeless 5/10. Pt denies any SI/HI/AVH today. Pt verbalized that he is tolerating his meds well. No adverse reaction to meds verbalized by pt. Pt compliant with taking meds and attending groups. Pt continues to use a cane while ambulating.  Medications administered as ordered MD. Verbal support given. Pt encouraged to attend groups. 15 minute checks performed for safety.  Pt safety maintained at this time. Pt receptive to treatment.

## 2014-02-20 NOTE — Plan of Care (Signed)
Problem: Diagnosis: Increased Risk For Suicide Attempt Goal: LTG-Patient Will Show Positive Response to Medication LTG (by discharge) : Patient will show positive response to medication and will participate in the development of the discharge plan.  Outcome: Progressing Client will report to staff any adverse symptoms of medication regime. "my blood pressure been low"  Client educated on potential for hypotension with [facility], staffed with Dianna Limbo, PA new parameters in place, client aware. Client encouraged to drink plenty fluids.

## 2014-02-20 NOTE — Progress Notes (Signed)
ANTICOAGULATION CONSULT NOTE - Follow Up Consult  Pharmacy Consult for coumadin Indication: ho dvt/PE   Allergies  Allergen Reactions  . Ibuprofen Hives    Patient Measurements: Height: 5' 9.29" (176 cm) Weight: 206 lb (93.441 kg) IBW/kg (Calculated) : 71.37   Vital Signs: Temp: 97.6 F (36.4 C) (11/18 0632) Temp Source: Oral (11/18 16100632) BP: 114/63 mmHg (11/18 0633) Pulse Rate: 98 (11/18 0633)  Labs:  Recent Labs  02/18/14 0430 02/19/14 0655 02/20/14 0630  HGB  --  14.8  --   HCT  --  43.6  --   PLT  --  216  --   LABPROT 15.4* 19.8* 19.1*  INR 1.21 1.67* 1.59*    Estimated Creatinine Clearance: 66 mL/min (by C-G formula based on Cr of 1.57).   Medications:  Scheduled:  . haloperidol  5 mg Oral BID   And  . benztropine  0.5 mg Oral BID  . clotrimazole   Topical BID  . cyclobenzaprine  10 mg Oral QHS  . enoxaparin (LOVENOX) injection  140 mg Subcutaneous Q24H  . feeding supplement (ENSURE COMPLETE)  237 mL Oral TID BM  . gabapentin  600 mg Oral TID  . lamoTRIgine  25 mg Oral Daily  . prazosin  4 mg Oral QHS  . traZODone  200 mg Oral QHS  . warfarin  12.5 mg Oral ONCE-1800  . Warfarin - Pharmacist Dosing Inpatient   Does not apply q1800    Assessment: INR decreased after decreased dose of 7.5mg  x 1  No problems with bleeding noted    Goal of Therapy:  INR 2-3    Plan:  Coumadin 12.5 mg x 1  PT/INR in am  Continue lovenox until INR >2.00  Charyl Dancerayne, Daneille Desilva Marie 02/20/2014,9:16 AM

## 2014-02-21 LAB — PROTIME-INR
INR: 1.61 — ABNORMAL HIGH (ref 0.00–1.49)
Prothrombin Time: 19.3 seconds — ABNORMAL HIGH (ref 11.6–15.2)

## 2014-02-21 MED ORDER — HYDROCORTISONE 2.5 % RE CREA
TOPICAL_CREAM | Freq: Three times a day (TID) | RECTAL | Status: DC
  Administered 2014-02-21 – 2014-02-27 (×6): via RECTAL
  Filled 2014-02-21: qty 28.35

## 2014-02-21 MED ORDER — PANTOPRAZOLE SODIUM 20 MG PO TBEC
20.0000 mg | DELAYED_RELEASE_TABLET | Freq: Two times a day (BID) | ORAL | Status: DC
  Administered 2014-02-21 – 2014-02-27 (×12): 20 mg via ORAL
  Filled 2014-02-21: qty 1
  Filled 2014-02-21: qty 14
  Filled 2014-02-21 (×8): qty 1
  Filled 2014-02-21: qty 14
  Filled 2014-02-21 (×2): qty 1
  Filled 2014-02-21: qty 14
  Filled 2014-02-21 (×3): qty 1
  Filled 2014-02-21: qty 14
  Filled 2014-02-21 (×2): qty 1

## 2014-02-21 MED ORDER — HYDROCORTISONE 1 % EX CREA
TOPICAL_CREAM | Freq: Three times a day (TID) | CUTANEOUS | Status: DC | PRN
Start: 2014-02-21 — End: 2014-02-21

## 2014-02-21 MED ORDER — WARFARIN SODIUM 7.5 MG PO TABS
7.5000 mg | ORAL_TABLET | Freq: Once | ORAL | Status: AC
  Administered 2014-02-21: 7.5 mg via ORAL
  Filled 2014-02-21: qty 1

## 2014-02-21 NOTE — BHH Group Notes (Signed)
The focus of this group is to educate the patient on the purpose and policies of crisis stabilization and provide a format to answer questions about their admission.  The group details unit policies and expectations of patients while admitted.  Patient attended 0900 nurse education orientation group this morning.  Patient actively participated, appropriate affect, alert, appropriate insight and engagement.  Today patient will work on 3 goals for discharge.  

## 2014-02-21 NOTE — Progress Notes (Signed)
Patient ID: Dale [name], male   DOB: 07-Aug-1965, 48 y.o.   MRN: [id number]  D: Patient pleasant and cooperative with staff; no inappropriate behaviors noted. Pt compliant with medications and is interacting well with peers in milieu.  A: Q 15 minute safety checks, encourage staff/peer interaction and group participation, administer medications as ordered. R: Pt denies SI/HI or plans to harm himself at this time. No s/s of distress noted during shift.

## 2014-02-21 NOTE — [facility] Group Notes (Signed)
[facility] LCSW Group Therapy  02/21/2014 3:41 PM   Type of Therapy:  Group Therapy  Participation Level:  Active  Participation Quality:  Attentive  Affect:  Appropriate  Cognitive:  Appropriate  Insight:  Improving  Engagement in Therapy:  Engaged  Modes of Intervention:  Clarification, Education, Exploration and Socialization  Summary of Progress/Problems: Today's group focused on relapse prevention.  We defined the term, and then brainstormed on ways to prevent relapse.  Sat with eyes closed for most of the group.  Appeared to be asleep.  But when engaged directly responded about his struggles.  Explained getting sidetracked after the death of friend.  "I returned to old friends and old ways.  Now I have been spending time analyzing and seeing where I went wrong.  And I know what I need to do moving forward."  Because of this he sees himself in recovery, and feels good about that.  "Sometimes you just have to humble yourself and ask for help."  Gave feedback to another patient about taking responsibility for behavior and actions.  Daryel Geraldorth, Yu Peggs B 02/21/2014 , 3:41 PM

## 2014-02-21 NOTE — Progress Notes (Signed)
Pt presents with flat affect and depressed mood. Pt rates depression 7/10, hopeless 5/10 and anxiety 8/10. Pt endorses auditory hallucinations. Pt denies SI today. Per pt, he is unclear about his discharge plans. Pt continues to use a cane when ambulating d/t unsteady gait. Pt compliant with taking meds and attending groups.  Medications administered as ordered per MD. Verbal support given. Pt encouraged to attend groups. 15 minute checks performed for safety.  Pt safety maintained. Pt receptive to treatment.

## 2014-02-21 NOTE — Progress Notes (Signed)
NUTRITION ASSESSMENT  RD consulted for diet education regarding dyslipidemia.  INTERVENTION: 1. Educated patient on the importance of nutrition and encouraged intake of food and beverages. 2. Discussed weight goals. 3. Supplements: Ensure Complete po TID, each supplement provides 350 kcal and 13 grams of protein   NUTRITION DIAGNOSIS: Unintentional weight loss related to sub-optimal intake as evidenced by pt report.   Goal: Pt to meet >/= 90% of their estimated nutrition needs.  Monitor:  PO intake  Assessment:  Pt admitted with substance abuse disorder, bipolar disorder, PTSD, depression and anxiety.  Pt reports weight loss of 20 lb over the last couple of weeks, UBW of 220 lb. Pt was ordered Ensure TID by MD. Pt reports drinking them.  Spoke with pt about diet and pt informed RD that he eats what he can on the street. He is also supplied with Ensure, so he drinks them also. Pt reports not eating as well recently d/t bad heartburn.  Diet recall suggests pt consumes excessive amounts of sugary beverages like juice and Hawaiian Punch. Encouraged pt to consume sugar-free beverages and/or water. Pt reports eating sweetened foods as well.  Encouraged pt to consume lean protein foods and less sugary beverages and food items as able.  Height: Ht Readings from Last 1 Encounters:  02/18/14 5' 9.29" (1.76 m)    Weight: Wt Readings from Last 1 Encounters:  02/18/14 206 lb (93.441 kg)    Weight Hx: Wt Readings from Last 10 Encounters:  02/18/14 206 lb (93.441 kg)  02/14/14 195 lb (88.451 kg)  09/15/13 215 lb (97.523 kg)  06/14/13 212 lb 11.9 oz (96.5 kg)  05/09/13 217 lb (98.431 kg)  04/17/13 219 lb (99.338 kg)  03/20/13 195 lb (88.451 kg)  03/02/13 194 lb 0.1 oz (88 kg)  03/02/13 195 lb (88.451 kg)  02/13/13 193 lb (87.544 kg)    BMI:  Body mass index is 30.17 kg/(m^2). Pt meets criteria for obesity based on current BMI.  Estimated Nutritional Needs: Kcal: 25-30  kcal/kg Protein: > 1 gram protein/kg Fluid: 1 ml/kcal  Diet Order: Diet Heart Pt is also offered choice of unit snacks mid-morning and mid-afternoon.  Pt is eating as desired.   Lab results and medications reviewed:  TG: 312 HgbA1c: 6.3  Tilda Franco, MS, [place], LDN Pager: [phone] After Hours Pager: [phone]
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
## 2014-02-21 NOTE — Progress Notes (Signed)
Patient ID: Dale [name], male   DOB: [birth year], 48 y.o.   MRN: [id number]  D: Patient pleasant and cooperative with care, brightens on approach. Pt attending group sessions and is appropriate with peers in milieu. A: Q 15 minute safety checks, encourage staff/peer interaction and medication compliance. R: Patient denies SI/HI; pt compliant with medications this shift. No distress noted.

## 2014-02-22 LAB — PROTIME-INR
INR: 2.27 — AB (ref 0.00–1.49)
Prothrombin Time: 25.2 seconds — ABNORMAL HIGH (ref 11.6–15.2)

## 2014-02-22 MED ORDER — TRAZODONE HCL 100 MG PO TABS
100.0000 mg | ORAL_TABLET | Freq: Every evening | ORAL | Status: DC | PRN
Start: 2014-02-22 — End: 2014-02-24

## 2014-02-22 MED ORDER — LAMOTRIGINE 25 MG PO TABS
50.0000 mg | ORAL_TABLET | Freq: Two times a day (BID) | ORAL | Status: DC
  Administered 2014-02-22 – 2014-02-26 (×8): 50 mg via ORAL
  Filled 2014-02-22 (×10): qty 2

## 2014-02-22 MED ORDER — BENZTROPINE MESYLATE 0.5 MG PO TABS
0.5000 mg | ORAL_TABLET | Freq: Every day | ORAL | Status: DC
  Administered 2014-02-22: 0.5 mg via ORAL
  Filled 2014-02-22 (×4): qty 1

## 2014-02-22 MED ORDER — WARFARIN SODIUM 7.5 MG PO TABS
7.5000 mg | ORAL_TABLET | Freq: Once | ORAL | Status: AC
  Administered 2014-02-22: 7.5 mg via ORAL
  Filled 2014-02-22: qty 1

## 2014-02-22 MED ORDER — HALOPERIDOL 5 MG PO TABS
5.0000 mg | ORAL_TABLET | Freq: Every day | ORAL | Status: DC
  Filled 2014-02-22: qty 1

## 2014-02-22 MED ORDER — NORTRIPTYLINE HCL 10 MG PO CAPS
10.0000 mg | ORAL_CAPSULE | Freq: Every day | ORAL | Status: DC
  Administered 2014-02-22: 10 mg via ORAL
  Filled 2014-02-22 (×4): qty 1

## 2014-02-22 MED ORDER — PRAZOSIN HCL 2 MG PO CAPS
2.0000 mg | ORAL_CAPSULE | Freq: Every day | ORAL | Status: DC
  Administered 2014-02-22 – 2014-02-26 (×5): 2 mg via ORAL
  Filled 2014-02-22: qty 14
  Filled 2014-02-22 (×4): qty 1
  Filled 2014-02-22: qty 14
  Filled 2014-02-22 (×2): qty 1

## 2014-02-22 MED ORDER — RISPERIDONE 1 MG PO TABS
1.0000 mg | ORAL_TABLET | Freq: Every day | ORAL | Status: DC
  Administered 2014-02-22: 1 mg via ORAL
  Filled 2014-02-22 (×4): qty 1

## 2014-02-22 MED ORDER — MENTHOL 3 MG MT LOZG
1.0000 | LOZENGE | OROMUCOSAL | Status: DC | PRN
  Administered 2014-02-26 – 2014-02-27 (×2): 3 mg via ORAL

## 2014-02-22 MED ORDER — BENZTROPINE MESYLATE 0.5 MG PO TABS
0.5000 mg | ORAL_TABLET | Freq: Every day | ORAL | Status: DC
  Filled 2014-02-22: qty 1

## 2014-02-22 NOTE — Tx Team (Signed)
  Interdisciplinary Treatment Plan Update   Date Reviewed:  02/22/2014  Time Reviewed:  1:06 PM  Progress in Treatment:   Attending groups: Yes Participating in groups: Yes Taking medication as prescribed: Yes  Tolerating medication: Yes Family/Significant other contact made: No Patient understands diagnosis: Yes  Discussing patient identified problems/goals with staff: Yes  See initial care plan Medical problems stabilized or resolved: Yes Denies suicidal/homicidal ideation: Yes  In tx team Patient has not harmed self or others: Yes  For review of initial/current patient goals, please see plan of care.  Estimated Length of Stay:  4-5 days  Reason for Continuation of Hospitalization: Depression Hallucinations Medication stabilization  New Problems/Goals identified:  N/A  Discharge Plan or Barriers:   stay with a friend or go to shelter, [facility]  Additional Comments: Patient continues to verbalize suicidal ideation and continues to have auditory hallucinations that commanding. Patient denies homicidal ideation. Does not appear to attend to internal stimuli, no visual hallucinations, no evidence of delusions, Patient has a history of cocaine abuse. Patient is logical and goal oriented in conversation. Agrees that he need assistance in managing depressive symptoms   Haldol, Lamictal, Neurontin combination  02/22/2014 Pt will likely be discharged on Monday. Pt has been taken off Haldol but is still receiving Lamictal and Neurontin. [facility] what antipsychotic will be affordable.  Attendees:  Signature: Ivin BootySarama Eappen, MD 02/22/2014 1:06 PM   Signature: Richelle Itood Kiron Osmun, LCSW 02/22/2014 1:06 PM  Signature:  02/22/2014 1:06 PM  Signature: Orlie PollenMarion Friedman, RN  02/22/2014 1:06 PM  Signature:  02/22/2014 1:06 PM  Signature:  02/22/2014 1:06 PM  Signature:   02/22/2014 1:06 PM  Signature:    Signature:    Signature:     Signature:    Signature:    Signature:      Scribe for Treatment Team:   Richelle Itood Elai Vanwyk, LCSW  02/22/2014 1:06 PM

## 2014-02-22 NOTE — Progress Notes (Signed)
Patient quite flat, irritable this morning. Complaining of anxiety and agitation. States pain continues to run at a 9/10 specifically in his R foot and L leg. Ambulating with cane and remains high fall risk. Wanting something to treat what he believes is a build up of scar tissue from previous surgeries to his R foot. Patient medicated per orders. Ativan given prn with little to no effect initially however as the day has progressed patient does appear slightly calmer. Support and reassurance given and fall precautions reviewed and encouraged. Suggested patient follow up after discharge regarding foot issues. Patient verbalized understanding of above. He contracts for safety while on unit, denies HI/AVH. Patient safe. Dale [name]

## 2014-02-22 NOTE — [facility] Group Notes (Signed)
[facility] LCSW Aftercare Discharge Planning Group Note   02/22/2014 10:49 AM  Participation Quality:  Minimal  Mood/Affect:  Irritable  Depression Rating:  8  Anxiety Rating:  8  Thoughts of Suicide:  No Will you contract for safety?   NA  Current AVH:  Yes  Plan for Discharge/Comments:  Grumpy today.  Did not sleep well last night. Has not been started on another anti psychotic since we were not able to talk to talk to [facility] on meds on their formulary yesterday.  Told him we would get that worked out today.  He is skeptical.  [employer]: bus  Supports: mother  Dale [name], Dale [name]

## 2014-02-22 NOTE — [facility] Suicide Risk Assessment (Signed)
[facility] INPATIENT:  Family/Significant Other Suicide Prevention Education  Suicide Prevention Education:  Contact Attempts:Debra Jaquita Rector [phone] has been identified by the patient as the family member/significant other with whom the patient will be residing, and identified as the person(s) who will aid the patient in the event of a mental health crisis.  With written consent from the patient, two attempts were made to provide suicide prevention education, prior to and/or following the patient's discharge.  We were unsuccessful in providing suicide prevention education.  A suicide education pamphlet was given to the patient to share with family/significant other.   Date and time of first attempt:02/20/2014 3:00 Date and time of second attempt:02/22/2014 11:29 AM   Hyatt,Candace 02/22/2014, 11:28 AM

## 2014-02-22 NOTE — Progress Notes (Signed)
ANTICOAGULATION CONSULT NOTE - Follow Up Consult  Pharmacy Consult for Coumadin Indication:h/o pe   Allergies  Allergen Reactions  . Ibuprofen Hives    Patient Measurements: Height: 5' 9.29" (176 cm) Weight: 206 lb (93.441 kg) IBW/kg (Calculated) : 71.37   VLabs:  Recent Labs  02/20/14 0630 02/21/14 0638 02/22/14 0621  LABPROT 19.1* 19.3* 25.2*  INR 1.59* 1.61* 2.27*    Estimated Creatinine Clearance: 66 mL/min (by C-G formula based on Cr of 1.57).   Medications:  Scheduled:  . clotrimazole   Topical BID  . cyclobenzaprine  10 mg Oral QHS  . feeding supplement (ENSURE COMPLETE)  237 mL Oral TID BM  . gabapentin  600 mg Oral TID  . hydrocortisone   Rectal TID  . lamoTRIgine  25 mg Oral BID  . pantoprazole  20 mg Oral BID AC  . prazosin  4 mg Oral QHS  . simvastatin  20 mg Oral q1800  . traZODone  200 mg Oral QHS  . warfarin  7.5 mg Oral ONCE-1800  . Warfarin - Pharmacist Dosing Inpatient   Does not apply q1800    Assessment: INR at goal today at 2.27. No bleeding noted  Goal of Therapy:  INR 2-3    Plan:  Dc lovenox  Coumadin 7.5 mg po x 1 today  Pt/inr in am   Charyl Dancer 02/22/2014,7:56 AM
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
## 2014-02-22 NOTE — [facility] Group Notes (Signed)
[facility] LCSW Group Therapy  02/22/2014  1:05 PM  Type of Therapy:  Group therapy  Participation Level:  Active  Participation Quality:  Attentive  Affect:  Flat  Cognitive:  Oriented  Insight:  Limited  Engagement in Therapy:  Limited  Modes of Intervention:  Discussion, Socialization  Summary of Progress/Problems:  Chaplain was here to lead a group on themes of hope and courage."Motivation is uplifting.  It is a drive to move forward. It inspires you to drive harder."  A good gospel song inspires and motivates Dale [name].  Also, being around positive people.  Dale [name] named the Tree surgeonartist and song that motivates him. For the benefit of the leader, he retraced his steps prior to admission.  Talked about asking for help. Daryel Geraldorth, Dale [name] 02/22/2014 12:42 PM

## 2014-02-22 NOTE — Plan of Care (Signed)
Problem: Ineffective individual coping Goal: STG: Patient will remain free from self harm Outcome: Progressing Patient has not engaged in any self harm and verbally contracts for safety.   Problem: Diagnosis: Increased Risk For Suicide Attempt Goal: STG-Patient Will Comply With Medication Regime Outcome: Progressing Patient med compliant.

## 2014-02-23 LAB — PROTIME-INR
INR: 2.46 — ABNORMAL HIGH (ref 0.00–1.49)
PROTHROMBIN TIME: 26.9 s — AB (ref 11.6–15.2)

## 2014-02-23 MED ORDER — NORTRIPTYLINE HCL 25 MG PO CAPS
25.0000 mg | ORAL_CAPSULE | Freq: Every day | ORAL | Status: DC
  Administered 2014-02-23 – 2014-02-25 (×3): 25 mg via ORAL
  Filled 2014-02-23 (×4): qty 1

## 2014-02-23 MED ORDER — RISPERIDONE 2 MG PO TABS
2.0000 mg | ORAL_TABLET | Freq: Every day | ORAL | Status: DC
  Administered 2014-02-23: 2 mg via ORAL
  Filled 2014-02-23 (×3): qty 1

## 2014-02-23 MED ORDER — BENZTROPINE MESYLATE 0.5 MG PO TABS
0.5000 mg | ORAL_TABLET | Freq: Every day | ORAL | Status: DC
  Administered 2014-02-23: 0.5 mg via ORAL
  Filled 2014-02-23 (×2): qty 1

## 2014-02-23 MED ORDER — WARFARIN SODIUM 7.5 MG PO TABS
7.5000 mg | ORAL_TABLET | Freq: Once | ORAL | Status: AC
  Administered 2014-02-23: 7.5 mg via ORAL
  Filled 2014-02-23: qty 1

## 2014-02-23 NOTE — Progress Notes (Signed)
Patient has been up in the dayroom watching tv and playing cards with peers. He has been c/o foot pain and received pain medications along with ice packs. Patient reported minimal relief. He has been observed in the dayroon playing card, laughing and talking with select peers. He report that he continues to see things and hears voices which are muffled. Writer encouaraged him to come to staff if voices become too intense. Patient returned to dayroom. Safety maintained on unit with 15 min checks.

## 2014-02-23 NOTE — Progress Notes (Signed)
Psychoeducational Group Note  Date:  11/13/2011 Time: 1015  Group Topic/Focus:  Identifying Needs:   The focus of this group is to help patients identify their personal needs that have been historically problematic and identify healthy behaviors to address their needs.  Participation Level:  Active  Participation Quality: good  Affect: flat  Cognitive:  ;imited  Insight:  good  Engagement in Group: engaged  Additional Comments:
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
## 2014-02-23 NOTE — Progress Notes (Signed)
Adult Psychoeducational Group Note  Date:  02/23/2014 Time:  12:06 AM  Group Topic/Focus:  Wrap-Up Group:   The focus of this group is to help patients review their daily goal of treatment and discuss progress on daily workbooks.  Participation Level:  Active  Participation Quality:  Appropriate  Affect:  Appropriate  Cognitive:  Appropriate  Insight: Appropriate  Engagement in Group:  Engaged  Modes of Intervention:  Discussion  Additional Comments:  Pt was present for wrap up group. He shared that he had a good day and he was able to reach out to family. He shared that day shift staff was very helpful today. He was pleasant and cooperative this evening.   Rosilyn Mings A 02/23/2014, 12:06 AM

## 2014-02-23 NOTE — Progress Notes (Signed)
Adult Psychoeducational Group Note  Date:  02/23/2014 Time:  8:49 PM  Group Topic/Focus:  Wrap-Up Group:   The focus of this group is to help patients review their daily goal of treatment and discuss progress on daily workbooks.  Participation Level:  Active  Participation Quality:  Appropriate  Affect:  Appropriate  Cognitive:  Appropriate  Insight: Appropriate  Engagement in Group:  Engaged  Modes of Intervention:  Discussion  Additional Comments: The patient attended group.[facility].  Dale [name], Dale [name] 02/23/2014, 8:49 PM

## 2014-02-23 NOTE — Plan of Care (Signed)
Problem: Diagnosis: Increased Risk For Suicide Attempt Goal: STG-Patient Will Comply With Medication Regime Outcome: Progressing Patient complaint with medication.
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
## 2014-02-23 NOTE — Consult Note (Signed)
WOC wound consult note Reason for Consult: Patient is s/p numerous surgeries between 2005-2008 on this foot and [facility] [place] (he does not know name).  There is hardware (he reports) in the foot and a scar is visible on the dorsal aspect at the 3rd toe.  There is a 2.5cm round callous on the plantar aspect of the right foot, 3rd metatarsal head that he reports has required aggressive paring in the past. This is  needed on an recurring basis, perhaps every 3 months or so. He is also in need of an orthotic for pressure redistribution so that calluses do not form or form less frrequently.   If you agree, please refer to podiatry for these services. Wound type:Neuropathic Pressure Ulcer POA: No Measurement:2.5cm round callous, no ulcer Wound JYN:WGNFbed:None Drainage (amount, consistency, odor) None Periwound:intact.  It is evident that patient has a fallen arch and is flat-footed on the right foot. He has an altered gait due to surgeries and pain. He ambulated with the assistance of a cane. Dressing procedure/placement/frequency: I have provided a soft silicone foam dressing (Allevyn 3x3, [place] [phone]) to pad the area so that ambulation is less painful, but this is not addressing the root cause of discomfort. Regretfully, this is the extent of my contribution as paring of the callous or other intervention is beyond the scope of WOC nursing practice. WOC nursing team will not follow, but will remain available to this patient, the nursing and medical teams.   Thanks, Ladona MowLaurie Mali Eppard, MSN, RN, GNP, [place], CWON-AP [phone])

## 2014-02-23 NOTE — Progress Notes (Signed)
Patient ID: Dale [name], male   DOB: 09-10-1965, 48 y.o.   MRN: [id number] D. Patient presents with depressed mood, affect irritable. Dale [name] states his mood remains depressed, and he states '' i didn't sleep at all lastnight, I kept waking up. Had to go to the bathroom several times. Also I'm still hearing the voices, whispers and mumbling. I'm still having those suicidal thoughts at night too. '' Patient is able to contract for safety, he reports '' i am just in pain, and want to get better. Being in the group and around everyone is helping. '' Patient did complete self inventory sheet and rates his depression at 8/10 on depression scale, 10 being worst. He also rates anxiety at the same. A. Medications given as ordered, including prn medication for anxiety/agitation. Discussed above information with Dr. Elna Breslow. Support and encouragement provided. R. Patient is safe. In no acute distress at this time. Will continue to monitor q 15 minutes for safety.

## 2014-02-23 NOTE — [facility] Group Notes (Signed)
[name] LCSW Group Therapy  02/23/2014 12:53 PM  Type of Therapy:  Group Therapy  Participation Level:  Did Not Attend   Beverly Sessions 02/23/2014, 12:53 PM

## 2014-02-24 DIAGNOSIS — R45851 Suicidal ideations: Secondary | ICD-10-CM

## 2014-02-24 DIAGNOSIS — F1490 Cocaine use, unspecified, uncomplicated: Secondary | ICD-10-CM

## 2014-02-24 DIAGNOSIS — F3164 Bipolar disorder, current episode mixed, severe, with psychotic features: Principal | ICD-10-CM

## 2014-02-24 DIAGNOSIS — F4310 Post-traumatic stress disorder, unspecified: Secondary | ICD-10-CM

## 2014-02-24 LAB — PROTIME-INR
INR: 2.47 — ABNORMAL HIGH (ref 0.00–1.49)
PROTHROMBIN TIME: 27 s — AB (ref 11.6–15.2)

## 2014-02-24 MED ORDER — BENZTROPINE MESYLATE 0.5 MG PO TABS
0.5000 mg | ORAL_TABLET | Freq: Every day | ORAL | Status: DC
  Administered 2014-02-24 – 2014-02-25 (×2): 0.5 mg via ORAL
  Filled 2014-02-24 (×3): qty 1

## 2014-02-24 MED ORDER — RISPERIDONE 3 MG PO TABS
3.0000 mg | ORAL_TABLET | Freq: Every day | ORAL | Status: DC
  Administered 2014-02-24 – 2014-02-25 (×2): 3 mg via ORAL
  Filled 2014-02-24 (×3): qty 1

## 2014-02-24 MED ORDER — NON FORMULARY: 2.0000 mg | Freq: Every day | Status: DC

## 2014-02-24 MED ORDER — ESZOPICLONE 2 MG PO TABS
2.0000 mg | ORAL_TABLET | Freq: Every day | ORAL | Status: DC
  Administered 2014-02-24 – 2014-02-25 (×2): 2 mg via ORAL
  Filled 2014-02-24 (×2): qty 1

## 2014-02-24 MED ORDER — ZOLPIDEM TARTRATE 5 MG PO TABS: 5.0000 mg | ORAL_TABLET | Freq: Every evening | ORAL | Status: DC | PRN

## 2014-02-24 MED ORDER — WARFARIN SODIUM 7.5 MG PO TABS
7.5000 mg | ORAL_TABLET | Freq: Once | ORAL | Status: AC
  Administered 2014-02-24: 7.5 mg via ORAL
  Filled 2014-02-24: qty 1

## 2014-02-24 NOTE — Progress Notes (Signed)
Writer had to leave report d/t an argument that took place in the dayroom between  him and another patient. Both patients were arguing over watching the football game. Patients were given a break from the tv briefly and group reinforced the unit rules. Writer spoke with him 1:1 and he reports having had a rough day. He reports that he continues to have suicidal thoughts and hearing voices. Support and encouragement given, safety maintained on unti with 15 min checks.

## 2014-02-24 NOTE — [facility] Group Notes (Signed)
[facility] LCSW Group Therapy  02/24/2014 1:00 PM  Type of Therapy:  Group Therapy  Participation Level:  Minimal  Participation Quality:  Appropriate  Affect:  Appropriate  Cognitive:  Alert and Oriented  Insight:  Developing/Improving  Engagement in Therapy:  Improving  Modes of Intervention:  Discussion  Summary of Progress/Problems:  Patient participated in group during which we discussed the 5 Ws of Support. Who, Why, What, Where and When. This group discussion talked about Who their identified supports are. Why those people are chosen as their supports. What is the difference between Support and Enabling. Where can you reach your supports and When do you need your supports.  Group context provided for empowerment of positive behaviors in the patient.   Beverly Sessions 02/24/2014, 1:00 PM
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
## 2014-02-24 NOTE — Progress Notes (Signed)
Patient ID: Bronislaus Jeff, male   DOB: 07/22/1965, 48 y.o.   MRN: [id number] D. Patient presents with depressed mood, affect irritable again today.  Alonzo continues to report very poor sleep last night. He states ''I'm agitated, I'm still hearing the voices and I didn't sleep at all lastnight. They need to give me something else. Maybe add trazodone , i don't know but I feel worse today. I am irritated and my room mate is up this morning so I can't take a nap . I also want some hydrocortisone cream '' Patient did complete self inventory sheet and rates his depression at 8/10 on depression scale, 10 being worst. He also rates anxiety at the same again today. A. Medications given as ordered, including prn medication for anxiety/agitation. Discussed above information with Dr. Elna Breslow. Support and encouragement provided. R. Patient is safe. In no acute distress at this time. Will continue to monitor q 15 minutes for safety.

## 2014-02-24 NOTE — Progress Notes (Signed)
Psychoeducational Group Note  Date:  02/24/2014 Time: 1015 Group Topic/Focus:  Making Healthy Choices:   The focus of this group is to help patients identify negative/unhealthy choices they were using prior to admission and identify positive/healthier coping strategies to replace them upon discharge.  Participation Level:  Did Not Attend   Additional Comments:    Rich Brave 1:52 PM. 02/24/2014

## 2014-02-25 LAB — CBC
HCT: 41.4 % (ref 39.0–52.0)
Hemoglobin: 13.8 g/dL (ref 13.0–17.0)
MCH: 29.6 pg (ref 26.0–34.0)
MCHC: 33.3 g/dL (ref 30.0–36.0)
MCV: 88.8 fL (ref 78.0–100.0)
PLATELETS: 211 10*3/uL (ref 150–400)
RBC: 4.66 MIL/uL (ref 4.22–5.81)
RDW: 14.5 % (ref 11.5–15.5)
WBC: 4.8 10*3/uL (ref 4.0–10.5)

## 2014-02-25 LAB — PROTIME-INR
INR: 2.78 — ABNORMAL HIGH (ref 0.00–1.49)
PROTHROMBIN TIME: 29.6 s — AB (ref 11.6–15.2)

## 2014-02-25 MED ORDER — WARFARIN SODIUM 6 MG PO TABS
6.0000 mg | ORAL_TABLET | Freq: Once | ORAL | Status: AC
  Administered 2014-02-25: 6 mg via ORAL
  Filled 2014-02-25: qty 1

## 2014-02-25 MED ORDER — HYDROCORTISONE 1 % EX CREA
TOPICAL_CREAM | Freq: Two times a day (BID) | CUTANEOUS | Status: DC
  Administered 2014-02-25 – 2014-02-26 (×2): via TOPICAL
  Filled 2014-02-25 (×2): qty 28

## 2014-02-25 NOTE — [facility] Group Notes (Signed)
[facility] LCSW Group Therapy  02/25/2014 1:36 PM  Type of Therapy:  Group Therapy  Participation Level: Invited- Did Not Attend. Pt left room and returned toward the end of group.   Summary of Progress/Problems: Group Topic-Resiliency and Vulnerability: Group members were asked to define resiliency and vulnerability and explore how resiliency and vulnerability have affected their lives. Group members were invited to share personal stories detailing their resiliency and vulnerability factors and explore how resilient/vulnerable they feel in their current situations. When in group, Dale [name] was attentive and engaged. He shared that being positive and "renewing my mind and training my thoughts in order to change." Pt stated that he learned how to think this way from his father who he looks up to and identifies as a strong social support. Dale [name] continues to demonstrate improving insight and progress in the group setting.   Smart, Brigida Scotti LCSWA 02/25/2014, 1:36 PM

## 2014-02-25 NOTE — Progress Notes (Signed)
The focus of this group is to help patients review their daily goal of treatment and discuss progress on daily workbooks. Pt attended the evening group session and responded to all discussion prompts from the Writer. Pt shared that today was a good day on the hall, the highlight of which was "not having any triggers to anger." Pt was asked to clarify this by the Writer and said that, "I didn't mean to say nothing happened that could have triggered me. I mean that I didn't let anything be my trigger." Pt was given encouragement from the Writer and his peers for this continued display of self-control. Pt told the group that he planned to stay well upon discharge by "connecting back with my therapist and my support groups. They've always been there for me and I want to be there some day for someone else going through this.' Pt's affect was appropriate and he volunteered several supportive comments to his peers.

## 2014-02-25 NOTE — Progress Notes (Signed)
Writer has observed patient up in the dayroom most of the evening watching tv and interacting with peers. He attended group this evening and participated. He c/o his rash and requested hydrocortisone cream but an order was not received from NP. He reports that the doctor today had told him that the order would be put in. Patient was not very happy. He reports passive si and verbally contracts for safety. He denies hi/a/v hallucinations. Patient is compliant with his medications. Support and encouragement given, safety maintained on unit with 15 min checks.

## 2014-02-25 NOTE — Plan of Care (Signed)
Problem: Alteration in mood & ability to function due to Goal: LTG-Pt verbalizes understanding of importance of med regimen (Patient verbalizes understanding of importance of medication regimen and need to continue outpatient care and support groups)  Outcome: Progressing Patient inquires about his medications and if changes in his dosage. He is compliant with his medications

## 2014-02-25 NOTE — [facility] Group Notes (Signed)
[facility]   02/25/2014 10:41 AM  Participation Quality:  Minimal  Mood/Affect:  Lethargic  Depression Rating:  10  Anxiety Rating:  10  Thoughts of Suicide:  Yes Will you contract for safety?   Yes  Current AVH:  Yes  Plan for Discharge/Comments:  Dale BloomerShawn is now positive for all symptoms except HI.  According to his description, he is doing worse now that when he first came into the hospital.  He is focused on his inability to sleep and needing meds changed re: that.  Beyond that, he states that he tried [employer] over the weekend, "but no one ever answered."  States he will try again today after supper.  Presents as flat, depressed and is observed nodding off several times.  He was observed after group in an animated discussion with others about [employer] basketball.  Presentation is very different in informal settings.  Transportation Means: unk  Supports: girlfriend, parents   [place], [place] Mines[place]ney B

## 2014-02-25 NOTE — [facility] Group Notes (Signed)
Adult Psychoeducational Group Note  Date:  02/25/2014 Time:  5:48 AM  Group Topic/Focus:  Wrap-Up Group:     Participation Level:  Minimal  Participation Quality:  Sharing  Affect:  Appropriate  Cognitive:  Appropriate  Insight: Appropriate  Engagement in Group:  Engaged  Modes of Intervention:  Support  Additional Comments: When asked if patient could describe how his day was in one word he stated that it was a good day but had concerns [facility].  Asked that he would would write his concerns down at discharge.  Tomi BambergerChestnut, Yacqub Baston Coursey 02/25/2014, 5:48 AM

## 2014-02-25 NOTE — Progress Notes (Signed)
D:  Patient's self inventory sheet, patient has poor sleep, no sleep medication given.  Fair appetite, normal energy level.  Poor concentration.  Rated depression, hopeless and anxiety #10.  Denied withdrawals.  Does have SI thoughts, contracts for safety.  HI off/on.  Does hear voices about evil things.  Stated he does see specks.  Stated he has felt lightheaded, rash, pain in past 24 hours.  Worst pain #10.  Stated he needs hydrocortisone cream 1% for rash on his chest. A:  Medications administered per MD orders.  Emotional support and encouragement given patient. R:  Emotional support and encouragement given patient.
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
## 2014-02-25 NOTE — Plan of Care (Signed)
Problem: Alteration in mood & ability to function due to Goal: STG-Patient will attend groups Outcome: Progressing Patient attended evening group and participated.      
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
## 2014-02-25 NOTE — Plan of Care (Signed)
Problem: Consults Goal: Suicide Risk Patient Education (See Patient Education module for education specifics)  Outcome: Completed/Met Date Met:  02/25/14 Discussed suicidal thoughts with patient.

## 2014-02-26 DIAGNOSIS — R600 Localized edema: Secondary | ICD-10-CM

## 2014-02-26 DIAGNOSIS — F3160 Bipolar disorder, current episode mixed, unspecified: Secondary | ICD-10-CM

## 2014-02-26 DIAGNOSIS — F1420 Cocaine dependence, uncomplicated: Secondary | ICD-10-CM

## 2014-02-26 DIAGNOSIS — R0600 Dyspnea, unspecified: Secondary | ICD-10-CM

## 2014-02-26 LAB — PROTIME-INR
INR: 3.51 — ABNORMAL HIGH (ref 0.00–1.49)
PROTHROMBIN TIME: 35.5 s — AB (ref 11.6–15.2)

## 2014-02-26 MED ORDER — BENZTROPINE MESYLATE 0.5 MG PO TABS
0.5000 mg | ORAL_TABLET | Freq: Every day | ORAL | Status: DC
  Administered 2014-02-26: 0.5 mg via ORAL
  Filled 2014-02-26: qty 14
  Filled 2014-02-26 (×2): qty 1
  Filled 2014-02-26: qty 14

## 2014-02-26 MED ORDER — GABAPENTIN 300 MG PO CAPS
300.0000 mg | ORAL_CAPSULE | Freq: Every day | ORAL | Status: DC
  Administered 2014-02-26: 300 mg via ORAL
  Filled 2014-02-26 (×2): qty 1
  Filled 2014-02-26 (×2): qty 14

## 2014-02-26 MED ORDER — RISPERIDONE 2 MG PO TABS
4.0000 mg | ORAL_TABLET | Freq: Every day | ORAL | Status: DC
  Administered 2014-02-26: 4 mg via ORAL
  Filled 2014-02-26: qty 28
  Filled 2014-02-26: qty 2
  Filled 2014-02-26: qty 28
  Filled 2014-02-26: qty 2

## 2014-02-26 MED ORDER — ESZOPICLONE 2 MG PO TABS
3.0000 mg | ORAL_TABLET | Freq: Every day | ORAL | Status: DC
  Administered 2014-02-26: 3 mg via ORAL
  Filled 2014-02-26: qty 2

## 2014-02-26 MED ORDER — LAMOTRIGINE 25 MG PO TABS
75.0000 mg | ORAL_TABLET | Freq: Two times a day (BID) | ORAL | Status: DC
  Administered 2014-02-26 – 2014-02-27 (×2): 75 mg via ORAL
  Filled 2014-02-26 (×2): qty 84
  Filled 2014-02-26: qty 3
  Filled 2014-02-26: qty 84
  Filled 2014-02-26 (×2): qty 3
  Filled 2014-02-26: qty 84
  Filled 2014-02-26: qty 3
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
## 2014-02-26 NOTE — [facility] Group Notes (Signed)
[facility]  02/26/2014 1:41 PM   Emotion Regulation: This group focused on both positive and negative emotion identification and allowed group members to process ways to identify feelings, regulate negative emotions, and find healthy ways to manage internal/external emotions. Group members were asked to reflect on a time when their reaction to an emotion led to a negative outcome and explored how alternative responses using emotion regulation would have benefited them. Group members were also asked to discuss a time when emotion regulation was utilized when a negative emotion was experienced.   Type of Therapy:  Group Therapy  Participation Level:  Minimal  Participation Quality:  Appropriate  Affect:  Appropriate  Cognitive:  Appropriate  Insight:  Engaged and Supportive  Engagement in Therapy:  Developing/Improving  Modes of Intervention:  Discussion, Education and Problem-solving  Summary of Progress/Problems:  Patient spent most of the group coloring, but demonstrated that he was engaged by making appropriate comments and offering supportive suggestions to peers.  Identified that patience is needed when dealing w situations that require waiting, "that's how it's gonna be in society", states that he calms himself down by listening to gospel music, "[employer] goldies", going for a walk, and finding someone else to talk to who might offer a different perspective.  Gave suggestions to younger peers in room about how to cope w their frustrations.  Sallee LangeCunningham, Delon Revelo C 02/26/2014, 1:41 PM

## 2014-02-26 NOTE — Progress Notes (Signed)
Patient stated he is concerned about his R foot pain/swelling.  Patient sent to vascular lab this afternoon, negative for DVT.  Patient stated he is looking forward to discharge tomorrow.

## 2014-02-26 NOTE — [facility] Group Notes (Signed)
The focus of this group is to educate the patient on the purpose and policies of crisis stabilization and provide a format to answer questions about their admission.  The group details unit policies and expectations of patients while admitted.  Patient attended 0900 nurse education orientation group this morning.  Patient actively participated, appropriate affect, alert, appropriate insight and engagement.  

## 2014-02-26 NOTE — Plan of Care (Signed)
Problem: Consults Goal: Substance Abuse Patient Education See Patient Education Module for education specifics.  Outcome: Completed/Met Date Met:  02/26/14 Discussed substance abuse with patient.     
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
## 2014-02-26 NOTE — Progress Notes (Signed)
D. Pt had been up and visible in milieu this evening, attended and participated in evening group activity. Pt spoke about having a decent day and spoke about how he has been having a difficult time staying asleep and reports that last night he got up around 2 am and was unable to get back to sleep and is concerned about this and spoke about how his medications are being adjusted to help with this. Pt also has some swelling in his right foot and said that he would discuss that with the doctor in the morning. Pt did receive all medications without incident. A. Support and encouragement provided. R. Safety maintained, will continue to monitor.
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
## 2014-02-26 NOTE — Progress Notes (Signed)
Adult Psychoeducational Group Note  Date:  02/26/2014 Time:  10:19 PM  Group Topic/Focus:  Wrap-Up Group:   The focus of this group is to help patients review their daily goal of treatment and discuss progress on daily workbooks.  Participation Level:  Minimal  Participation Quality:  Resistant  Affect:  Flat  Cognitive:  Oriented  Insight: Lacking  Engagement in Group:  Poor  Modes of Intervention:  Socialization and Support  Additional Comments:  Patient attended group tonight, however, did not participated. He appears to be annoyed with the behaviors of his peers  Scot DockFrancis, Harlean Regula Dacosta 02/26/2014, 10:19 PM

## 2014-02-26 NOTE — Tx Team (Signed)
  Interdisciplinary Treatment Plan Update   Date Reviewed:  02/26/2014  Time Reviewed:  8:38 AM  Progress in Treatment:   Attending groups: Yes Participating in groups: Yes Taking medication as prescribed: Yes  Tolerating medication: Yes Family/Significant other contact made: Yes  Patient understands diagnosis: Yes  Discussing patient identified problems/goals with staff: Yes  See initial care plan Medical problems stabilized or resolved: Yes Denies suicidal/homicidal ideation: Yes  In tx team Patient has not harmed self or others: Yes  For review of initial/current patient goals, please see plan of care.  Estimated Length of Stay:   Likely d/c tomorrow  Reason for Continuation of Hospitalization:   New Problems/Goals identified:  N/A  Discharge Plan or Barriers:   return home, follow up outpt  Additional Comments:  Attendees:  Signature: Ivin BootySarama Eappen, MD 02/26/2014 8:38 AM   Signature: Richelle Itood Jahniah Pallas, LCSW 02/26/2014 8:38 AM  Signature: Fransisca KaufmannLaura Davis, NP 02/26/2014 8:38 AM  Signature: Leighton ParodyBritney Tyson, RN 02/26/2014 8:38 AM  Signature:  02/26/2014 8:38 AM  Signature:  02/26/2014 8:38 AM  Signature:   02/26/2014 8:38 AM  Signature:    Signature:    Signature:    Signature:    Signature:    Signature:      Scribe for Treatment Team:   Richelle Itood Brookelynne Dimperio, LCSW  02/26/2014 8:38 AM
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
## 2014-02-26 NOTE — Progress Notes (Signed)
VASCULAR LAB PRELIMINARY  PRELIMINARY  PRELIMINARY  PRELIMINARY  Right lower extremity venous duplex completed.    Preliminary report:  Right:  No evidence of DVT, superficial thrombosis, or Baker's cyst.  Craigory Toste, RVT 02/26/2014, 12:22 PM

## 2014-02-27 ENCOUNTER — Encounter (HOSPITAL_COMMUNITY): Payer: Self-pay | Admitting: Psychiatry

## 2014-02-27 ENCOUNTER — Ambulatory Visit (HOSPITAL_COMMUNITY)
Admit: 2014-02-27 | Discharge: 2014-02-27 | Disposition: A | Discharge status: Home / Self Care | Payer: Self-pay | Attending: Internal Medicine | Admitting: Internal Medicine

## 2014-02-27 DIAGNOSIS — K219 Gastro-esophageal reflux disease without esophagitis: Secondary | ICD-10-CM | POA: Insufficient documentation

## 2014-02-27 DIAGNOSIS — M25476 Effusion, unspecified foot: Secondary | ICD-10-CM | POA: Insufficient documentation

## 2014-02-27 DIAGNOSIS — Z86718 Personal history of other venous thrombosis and embolism: Secondary | ICD-10-CM | POA: Insufficient documentation

## 2014-02-27 DIAGNOSIS — Z86711 Personal history of pulmonary embolism: Secondary | ICD-10-CM | POA: Insufficient documentation

## 2014-02-27 DIAGNOSIS — M79671 Pain in right foot: Secondary | ICD-10-CM | POA: Insufficient documentation

## 2014-02-27 DIAGNOSIS — G609 Hereditary and idiopathic neuropathy, unspecified: Secondary | ICD-10-CM | POA: Insufficient documentation

## 2014-02-27 DIAGNOSIS — F329 Major depressive disorder, single episode, unspecified: Secondary | ICD-10-CM | POA: Insufficient documentation

## 2014-02-27 DIAGNOSIS — F419 Anxiety disorder, unspecified: Secondary | ICD-10-CM | POA: Insufficient documentation

## 2014-02-27 DIAGNOSIS — Z5181 Encounter for therapeutic drug level monitoring: Secondary | ICD-10-CM

## 2014-02-27 DIAGNOSIS — Z7901 Long term (current) use of anticoagulants: Secondary | ICD-10-CM | POA: Insufficient documentation

## 2014-02-27 DIAGNOSIS — I10 Essential (primary) hypertension: Secondary | ICD-10-CM | POA: Insufficient documentation

## 2014-02-27 LAB — PROTIME-INR
INR: 2.42 — ABNORMAL HIGH (ref 0.00–1.49)
Prothrombin Time: 26.5 seconds — ABNORMAL HIGH (ref 11.6–15.2)

## 2014-02-27 MED ORDER — RISPERIDONE 4 MG PO TABS: 4.0000 mg | ORAL_TABLET | Freq: Every day | ORAL | Status: DC

## 2014-02-27 MED ORDER — PRAZOSIN HCL 2 MG PO CAPS: 2.0000 mg | ORAL_CAPSULE | Freq: Every day | ORAL | Status: DC

## 2014-02-27 MED ORDER — SIMVASTATIN 20 MG PO TABS: 20.0000 mg | ORAL_TABLET | Freq: Every day | ORAL | Status: DC

## 2014-02-27 MED ORDER — BENZTROPINE MESYLATE 0.5 MG PO TABS: 0.5000 mg | ORAL_TABLET | Freq: Every day | ORAL | Status: DC

## 2014-02-27 MED ORDER — HYDROCORTISONE 1 % EX CREA: TOPICAL_CREAM | Freq: Two times a day (BID) | CUTANEOUS | Status: DC

## 2014-02-27 MED ORDER — CYCLOBENZAPRINE HCL 10 MG PO TABS: 10.0000 mg | ORAL_TABLET | Freq: Every day | ORAL | Status: DC

## 2014-02-27 MED ORDER — GABAPENTIN 300 MG PO CAPS: 300.0000 mg | ORAL_CAPSULE | Freq: Every day | ORAL | Status: DC

## 2014-02-27 MED ORDER — ESZOPICLONE 3 MG PO TABS: 3.0000 mg | ORAL_TABLET | Freq: Every day | ORAL | Status: DC

## 2014-02-27 MED ORDER — LAMOTRIGINE 25 MG PO TABS: 75.0000 mg | ORAL_TABLET | Freq: Two times a day (BID) | ORAL | Status: DC

## 2014-02-27 MED ORDER — CLOTRIMAZOLE 1 % EX CREA: TOPICAL_CREAM | Freq: Two times a day (BID) | CUTANEOUS | Status: DC

## 2014-02-27 MED ORDER — HYDROCORTISONE 2.5 % RE CREA: TOPICAL_CREAM | Freq: Three times a day (TID) | RECTAL | Status: DC

## 2014-02-27 MED ORDER — PANTOPRAZOLE SODIUM 20 MG PO TBEC: 20.0000 mg | DELAYED_RELEASE_TABLET | Freq: Two times a day (BID) | ORAL | Status: DC

## 2014-02-27 MED ORDER — GABAPENTIN 300 MG PO CAPS: 600.0000 mg | ORAL_CAPSULE | Freq: Three times a day (TID) | ORAL | Status: DC

## 2014-02-27 MED ORDER — WARFARIN SODIUM 7.5 MG PO TABS
7.5000 mg | ORAL_TABLET | Freq: Every day | ORAL | Status: DC
  Filled 2014-02-27: qty 14
  Filled 2014-02-27: qty 1

## 2014-02-27 MED ORDER — WARFARIN SODIUM 7.5 MG PO TABS: 7.5000 mg | ORAL_TABLET | Freq: Every day | ORAL | Status: DC

## 2014-02-27 NOTE — Progress Notes (Signed)
D: Patient is alert and oriented. Pt's mood and affect is appropriate and pleasant. Pt reports depression 6/10, hopelessness 4/10, and anxiety 7/10. Pt states his plan for the day is to "stay on the right path." Pt denies SI/HI and AVH. Pt complains of sore throat this am. A: Pt sent to [facility] radiology for R foot xray; pt returned. Scheduled medications administered per providers orders (See MAR). PRN medications administered for sore throat per providers orders (See MAR). 15 minute checks completed per protocol for pt safety. R: Pt cooperative and receptive to nursing interventions.
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
## 2014-02-27 NOTE — Progress Notes (Signed)
Patient transported to [facility] Radiology via EMS, with [employer] and [facility], [name]. BP 107/63, pulse 106, 96% on room air.

## 2014-02-27 NOTE — Progress Notes (Signed)
D. Pt has been up and visible in milieu this evening, attended and participated in evening group activity. Pt spoke about getting his foot checked at the hospital earlier today and spoke about how he was relieved it did not show any blood clots. Pt reports on-going anxiety and does look anxious in the milieu and also spoke about his on-going troubles with sleep and does report on-going auditory hallucinations as well but does report feeling somewhat better since coming in. A. Support and encouragement provided. R. Safety maintained, will continue to monitor.
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
## 2014-02-27 NOTE — Progress Notes (Signed)
[facility] Adult Case Management Discharge Plan :  Will you be returning to the same living situation after discharge: Yes,  home At discharge, do you have transportation home?:Yes,  bus pass Do you have the ability to pay for your medications:Yes,  mental health  Release of information consent forms completed and in the chart;  Patient's signature needed at discharge.  Patient to Follow up at: Follow-up Information    [facility] with [facility].   Specialty:  Professional Counselor   Why:  Appointment for individual therapy on Dec. 4th at 3:00pm.    Contact information:   [facility] [place] [address] [place] [address] [phone]       Follow up with [facility] health and [facility].   Why:  I was not able to get through to get you an appointment.  Call them to set up an apointment with the Dr so that you can get a referral to an orthopedist for your foot   Contact information:   [address] [phone]      Patient denies SI/HI:   Yes,  yes    Safety Planning and Suicide Prevention discussed:  Yes,  yes  Dale [name] 02/27/2014, 12:11 PM

## 2014-02-27 NOTE — Progress Notes (Signed)
DISCHARGE NOTE: D: Patient was alert and oriented upon discharge in stable condition and ambulatory. Pt denies SI/HI and AVH. A: AVS reviewed and a copy was given to pt. Medications/Prescriptions given to pt. Follow up reviewed with pt. Resources reviewed including [facility]. Pt was given time to ask questions and express concerns. Belongings returned to pt. R: Pt D/C'd with bus pass.

## 2014-03-04 NOTE — Progress Notes (Signed)
Patient Discharge Instructions:  After Visit Summary (AVS):   Faxed to:  03/04/14 Discharge Summary Note:   Faxed to:  03/04/14 Psychiatric Admission Assessment Note:   Faxed to:  03/04/14 Suicide Risk Assessment - Discharge Assessment:   Faxed to:  03/04/14 Faxed/Sent to the Next Level Care provider:  03/04/14 Next Level Care Provider Has Access to the EMR, 03/04/14  Faxed to [facility] [place] @ [phone] Records provided to [facility] via CHL/Epic access.  Jerelene Redden, 03/04/2014, 3:20 PM

## 2014-03-07 ENCOUNTER — Ambulatory Visit: Payer: Self-pay | Admitting: Internal Medicine

## 2014-03-14 ENCOUNTER — Ambulatory Visit: Payer: Self-pay | Attending: Internal Medicine | Admitting: Pharmacist

## 2014-03-14 ENCOUNTER — Inpatient Hospital Stay: Payer: Self-pay | Admitting: Internal Medicine

## 2014-03-14 DIAGNOSIS — Z5181 Encounter for therapeutic drug level monitoring: Secondary | ICD-10-CM

## 2014-03-14 DIAGNOSIS — I2699 Other pulmonary embolism without acute cor pulmonale: Secondary | ICD-10-CM

## 2014-03-14 LAB — POCT INR: INR: 3.8

## 2014-03-14 MED ORDER — WARFARIN SODIUM 7.5 MG PO TABS: 7.5000 mg | ORAL_TABLET | Freq: Every day | ORAL | Status: DC

## 2014-03-21 ENCOUNTER — Encounter: Payer: Self-pay | Admitting: Pharmacist

## 2014-03-25 ENCOUNTER — Ambulatory Visit (HOSPITAL_BASED_OUTPATIENT_CLINIC_OR_DEPARTMENT_OTHER): Payer: Self-pay | Admitting: Pharmacist

## 2014-03-25 ENCOUNTER — Ambulatory Visit: Payer: Self-pay | Attending: Internal Medicine | Admitting: Internal Medicine

## 2014-03-25 DIAGNOSIS — I8290 Acute embolism and thrombosis of unspecified vein: Secondary | ICD-10-CM

## 2014-03-25 LAB — POCT INR: INR: 1.4

## 2014-04-01 ENCOUNTER — Ambulatory Visit: Payer: Self-pay | Attending: Internal Medicine | Admitting: Pharmacist

## 2014-04-01 DIAGNOSIS — I8290 Acute embolism and thrombosis of unspecified vein: Secondary | ICD-10-CM | POA: Insufficient documentation

## 2014-04-01 LAB — POCT INR: INR: 1.7

## 2014-04-04 ENCOUNTER — Ambulatory Visit: Payer: Self-pay

## 2014-04-08 ENCOUNTER — Ambulatory Visit: Payer: Self-pay | Attending: Internal Medicine | Admitting: Pharmacist

## 2014-04-08 DIAGNOSIS — I8290 Acute embolism and thrombosis of unspecified vein: Secondary | ICD-10-CM

## 2014-04-08 LAB — POCT INR: INR: 1.8

## 2014-04-15 ENCOUNTER — Ambulatory Visit: Payer: Self-pay | Attending: Internal Medicine | Admitting: Pharmacist

## 2014-04-15 DIAGNOSIS — Z5181 Encounter for therapeutic drug level monitoring: Secondary | ICD-10-CM

## 2014-04-22 ENCOUNTER — Encounter: Payer: Self-pay | Admitting: Pharmacist

## 2014-04-26 ENCOUNTER — Encounter (HOSPITAL_COMMUNITY): Payer: Self-pay | Admitting: Emergency Medicine

## 2014-04-26 ENCOUNTER — Emergency Department (HOSPITAL_COMMUNITY)
Admission: EM | Admit: 2014-04-26 | Discharge: 2014-04-29 | Disposition: A | Discharge status: Home / Self Care | Payer: Federal, State, Local not specified - Other | Attending: Emergency Medicine | Admitting: Emergency Medicine

## 2014-04-26 DIAGNOSIS — F1410 Cocaine abuse, uncomplicated: Secondary | ICD-10-CM | POA: Diagnosis present

## 2014-04-26 DIAGNOSIS — I10 Essential (primary) hypertension: Secondary | ICD-10-CM | POA: Insufficient documentation

## 2014-04-26 DIAGNOSIS — F339 Major depressive disorder, recurrent, unspecified: Secondary | ICD-10-CM | POA: Diagnosis present

## 2014-04-26 DIAGNOSIS — F419 Anxiety disorder, unspecified: Secondary | ICD-10-CM | POA: Insufficient documentation

## 2014-04-26 DIAGNOSIS — F332 Major depressive disorder, recurrent severe without psychotic features: Secondary | ICD-10-CM | POA: Insufficient documentation

## 2014-04-26 DIAGNOSIS — Z872 Personal history of diseases of the skin and subcutaneous tissue: Secondary | ICD-10-CM | POA: Insufficient documentation

## 2014-04-26 DIAGNOSIS — R45851 Suicidal ideations: Secondary | ICD-10-CM

## 2014-04-26 DIAGNOSIS — F4310 Post-traumatic stress disorder, unspecified: Secondary | ICD-10-CM | POA: Diagnosis present

## 2014-04-26 DIAGNOSIS — Z720 Tobacco use: Secondary | ICD-10-CM | POA: Insufficient documentation

## 2014-04-26 DIAGNOSIS — Z7952 Long term (current) use of systemic steroids: Secondary | ICD-10-CM | POA: Insufficient documentation

## 2014-04-26 DIAGNOSIS — Z79899 Other long term (current) drug therapy: Secondary | ICD-10-CM | POA: Insufficient documentation

## 2014-04-26 DIAGNOSIS — Z87828 Personal history of other (healed) physical injury and trauma: Secondary | ICD-10-CM | POA: Insufficient documentation

## 2014-04-26 DIAGNOSIS — Z7901 Long term (current) use of anticoagulants: Secondary | ICD-10-CM | POA: Insufficient documentation

## 2014-04-26 LAB — CBC
HCT: 48.4 % (ref 39.0–52.0)
HEMOGLOBIN: 16.5 g/dL (ref 13.0–17.0)
MCH: 30.8 pg (ref 26.0–34.0)
MCHC: 34.1 g/dL (ref 30.0–36.0)
MCV: 90.3 fL (ref 78.0–100.0)
Platelets: 234 10*3/uL (ref 150–400)
RBC: 5.36 MIL/uL (ref 4.22–5.81)
RDW: 13.9 % (ref 11.5–15.5)
WBC: 11 10*3/uL — ABNORMAL HIGH (ref 4.0–10.5)

## 2014-04-26 LAB — COMPREHENSIVE METABOLIC PANEL
ALT: 26 U/L (ref 0–53)
ANION GAP: 9 (ref 5–15)
AST: 32 U/L (ref 0–37)
Albumin: 4 g/dL (ref 3.5–5.2)
Alkaline Phosphatase: 57 U/L (ref 39–117)
BUN: 22 mg/dL (ref 6–23)
CO2: 27 mmol/L (ref 19–32)
Calcium: 8.9 mg/dL (ref 8.4–10.5)
Chloride: 107 mEq/L (ref 96–112)
Creatinine, Ser: 1.59 mg/dL — ABNORMAL HIGH (ref 0.50–1.35)
GFR, EST AFRICAN AMERICAN: 58 mL/min — AB (ref >90)
GFR, EST NON AFRICAN AMERICAN: 50 mL/min — AB (ref >90)
Glucose, Bld: 105 mg/dL — ABNORMAL HIGH (ref 70–99)
Potassium: 4.1 mmol/L (ref 3.5–5.1)
Sodium: 143 mmol/L (ref 135–145)
Total Bilirubin: 1.3 mg/dL — ABNORMAL HIGH (ref 0.3–1.2)
Total Protein: 6.7 g/dL (ref 6.0–8.3)

## 2014-04-26 LAB — PROTIME-INR
INR: 1.36 (ref 0.00–1.49)
Prothrombin Time: 16.9 seconds — ABNORMAL HIGH (ref 11.6–15.2)

## 2014-04-26 LAB — RAPID URINE DRUG SCREEN, HOSP PERFORMED
Amphetamines: NOT DETECTED
BENZODIAZEPINES: NOT DETECTED
Barbiturates: NOT DETECTED
Cocaine: POSITIVE — AB
Opiates: NOT DETECTED
Tetrahydrocannabinol: NOT DETECTED

## 2014-04-26 LAB — ETHANOL

## 2014-04-26 LAB — SALICYLATE LEVEL: Salicylate Lvl: <4 mg/dL (ref 2.8–20.0)

## 2014-04-26 LAB — ACETAMINOPHEN LEVEL: Acetaminophen (Tylenol), Serum: <10 ug/mL — ABNORMAL LOW (ref 10–30)

## 2014-04-26 MED ORDER — PANTOPRAZOLE SODIUM 20 MG PO TBEC
20.0000 mg | DELAYED_RELEASE_TABLET | Freq: Two times a day (BID) | ORAL | Status: DC
  Administered 2014-04-26 – 2014-04-29 (×6): 20 mg via ORAL
  Filled 2014-04-26 (×10): qty 1

## 2014-04-26 MED ORDER — WARFARIN SODIUM 7.5 MG PO TABS
7.5000 mg | ORAL_TABLET | Freq: Every day | ORAL | Status: DC
  Administered 2014-04-26 – 2014-04-27 (×2): 7.5 mg via ORAL
  Filled 2014-04-26 (×3): qty 1

## 2014-04-26 MED ORDER — NICOTINE 21 MG/24HR TD PT24
21.0000 mg | MEDICATED_PATCH | Freq: Every day | TRANSDERMAL | Status: DC
  Filled 2014-04-26: qty 1

## 2014-04-26 MED ORDER — SIMVASTATIN 20 MG PO TABS
20.0000 mg | ORAL_TABLET | Freq: Every day | ORAL | Status: DC
  Administered 2014-04-26 – 2014-04-28 (×3): 20 mg via ORAL
  Filled 2014-04-26 (×5): qty 1

## 2014-04-26 MED ORDER — GABAPENTIN 100 MG PO CAPS
100.0000 mg | ORAL_CAPSULE | Freq: Three times a day (TID) | ORAL | Status: DC
  Administered 2014-04-26 – 2014-04-29 (×7): 100 mg via ORAL
  Filled 2014-04-26 (×7): qty 1

## 2014-04-26 MED ORDER — PRAZOSIN HCL 2 MG PO CAPS
2.0000 mg | ORAL_CAPSULE | Freq: Every day | ORAL | Status: DC
  Administered 2014-04-26 – 2014-04-28 (×3): 2 mg via ORAL
  Filled 2014-04-26 (×5): qty 1

## 2014-04-26 MED ORDER — WARFARIN SODIUM 7.5 MG PO TABS: 7.5000 mg | ORAL_TABLET | Freq: Every day | ORAL | Status: DC

## 2014-04-26 MED ORDER — LORAZEPAM 1 MG PO TABS
1.0000 mg | ORAL_TABLET | Freq: Three times a day (TID) | ORAL | Status: DC | PRN
  Administered 2014-04-26: 1 mg via ORAL
  Filled 2014-04-26: qty 1

## 2014-04-26 MED ORDER — CYCLOBENZAPRINE HCL 10 MG PO TABS
10.0000 mg | ORAL_TABLET | Freq: Two times a day (BID) | ORAL | Status: DC
  Administered 2014-04-26 (×2): 10 mg via ORAL
  Filled 2014-04-26 (×2): qty 1

## 2014-04-26 MED ORDER — WARFARIN - PHYSICIAN DOSING INPATIENT: Freq: Every day | Status: DC

## 2014-04-26 MED ORDER — RISPERIDONE 1 MG PO TABS
1.0000 mg | ORAL_TABLET | Freq: Two times a day (BID) | ORAL | Status: DC
  Administered 2014-04-26 – 2014-04-29 (×6): 1 mg via ORAL
  Filled 2014-04-26 (×7): qty 1

## 2014-04-26 MED ORDER — ACETAMINOPHEN 325 MG PO TABS
650.0000 mg | ORAL_TABLET | ORAL | Status: DC | PRN
  Administered 2014-04-26 – 2014-04-28 (×2): 650 mg via ORAL
  Filled 2014-04-26 (×2): qty 2

## 2014-04-26 MED ORDER — GABAPENTIN 300 MG PO CAPS: 300.0000 mg | ORAL_CAPSULE | Freq: Every day | ORAL | Status: DC

## 2014-04-26 MED ORDER — ONDANSETRON HCL 4 MG PO TABS: 4.0000 mg | ORAL_TABLET | Freq: Three times a day (TID) | ORAL | Status: DC | PRN

## 2014-04-26 MED ORDER — BENZTROPINE MESYLATE 1 MG PO TABS
0.5000 mg | ORAL_TABLET | Freq: Every day | ORAL | Status: DC
  Administered 2014-04-26 – 2014-04-28 (×3): 0.5 mg via ORAL
  Filled 2014-04-26 (×3): qty 1

## 2014-04-26 MED ORDER — GABAPENTIN 300 MG PO CAPS
600.0000 mg | ORAL_CAPSULE | Freq: Three times a day (TID) | ORAL | Status: DC
  Administered 2014-04-26 – 2014-04-29 (×9): 600 mg via ORAL
  Filled 2014-04-26 (×9): qty 2

## 2014-04-26 MED ORDER — ALUM & MAG HYDROXIDE-SIMETH 200-200-20 MG/5ML PO SUSP: 30.0000 mL | ORAL | Status: DC | PRN
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
## 2014-04-26 NOTE — BH Assessment (Signed)
TTS Belenda Cruise has assessed the patient.

## 2014-04-26 NOTE — BH Assessment (Signed)
[facility] Assessment Progress Note  Per Thedore Mins, MD pt requires psychiatric hospitalization.  Berneice Heinrich, RN, [facility], reports that pt is under consideration at [facility], but it is uncertain at this time whether beds will be available.  The following contacts have been made in an effort to place this pt with results as noted:  *[facility]: currently at capacity *[facility]: currently at capacity *[facility]: no male beds available at this time [facility]: no male beds available at this time *[facility]: currently at capacity *[facility]: beds are available; numerous attempts were made to fax referral paperwork without success *[facility]: currently at capacity [facility]: beds are available; information has been faxed  Doylene Canning, MA Triage Specialist 04/26/2014 @ 16:37

## 2014-04-26 NOTE — ED Notes (Signed)
MD at bedside. 
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
## 2014-04-26 NOTE — ED Provider Notes (Signed)
4:45 PM called to see patient, he is complaining of right sided neck pain which has been ongoing for the past 2 months.  No trauma.   No fever. Pt is very angry and agitated. He states he usually takes flexeril  twice daily and has not been given anything but a hot pack since he has been here.  Will write for flexeril.  Also, his INR is 1.36- he is supposed to be taking coumadin but he states he has not taken recently.    Ethelda Chick, MD 04/26/14 [phone]

## 2014-04-26 NOTE — ED Notes (Signed)
Pt presents with complaint of L chest pain radiating under L axilla.  Rates pain as 10 on pain scale.  Dr Loretha Stapler notified, 12 lead EKG obtained.  Unable to assess pt, who repeatedly states talk to [place].  Pt redirectable, anxious, irritated. Awake. Alert & responsive.

## 2014-04-26 NOTE — ED Notes (Signed)
Patient is currently sleeping, vitals will be assessed at a later time attending nurse notified.

## 2014-04-26 NOTE — [facility] Counselor (Signed)
Disposition: Inpatient admission recommended by Dr. Jannifer Franklin and Catha Nottingham NP.   Kateri Plummer, M.S., LPCA, [facility] Licensed Professional Counselor Associate  Triage Specialist  [facility]  Therapeutic Triage Services Phone: [phone] Fax: [phone]

## 2014-04-26 NOTE — ED Notes (Signed)
Pt states he is having suicidal thoughts  Pt states it started earlier tonight and came on all of a sudden  When asked if he had a plan he said yes but when asked what he could not say  Pt states he has had them in the past

## 2014-04-26 NOTE — ED Notes (Signed)
Report received from [facility]. Pt. Alert and oriented in no distress denies pain.  Pt. States he is still having SI without plan and seeing and hearing "Sammy". Pt. Instructed to come to me with problems or concerns.Will continue to monitor for safety via security cameras and Q 15 minute checks.
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
## 2014-04-26 NOTE — ED Notes (Signed)
Patient agitated this am and yelling out at times. Reports neck pain that he has been having for months. Tylenol and ativan given at this time along with a heat pack. Patient wanted to see physician. Told him physician would see him this am. Patient attention seeking
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
## 2014-04-27 DIAGNOSIS — F332 Major depressive disorder, recurrent severe without psychotic features: Secondary | ICD-10-CM | POA: Insufficient documentation

## 2014-04-27 LAB — PROTIME-INR
INR: 1.22 (ref 0.00–1.49)
Prothrombin Time: 15.6 seconds — ABNORMAL HIGH (ref 11.6–15.2)

## 2014-04-27 MED ORDER — CYCLOBENZAPRINE HCL 10 MG PO TABS
10.0000 mg | ORAL_TABLET | Freq: Once | ORAL | Status: AC
  Administered 2014-04-27: 10 mg via ORAL
  Filled 2014-04-27: qty 1
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
## 2014-04-27 NOTE — Progress Notes (Signed)
Pt endorse SI. He says he will come to staff before harming himself. He appears depressed and has remained in bed for most of the a.m. He has taken scheduled medications without complaint. Will continue to monitor for needs and safety.

## 2014-04-27 NOTE — Progress Notes (Signed)
CSW continued placement efforts:    Left VM for disposition from [facility] as Patient was faxed there for possible consideration on previous shift.   Faxed to For Consideration: [facility] [place] [facility]- [facility] [place] [place] [facility]-refaxed as previous fax did not go through  [employer] as previous fax did not go through    Actor at At Capacity: [place] [place] [place]- [place] [place]   Please Note:   May inpatient Facilities reported due to transportation barriers if patients could not be safely transported today then do not attempt to make referral today as they will not be reviewed and considered due to weather.    Adelene Amas, LCSW Disposition Social Worker [phone]

## 2014-04-27 NOTE — ED Notes (Signed)
Report received from [employer]. Pt. Sleeping, respirations regular and unlabored. Will continue to monitor for safety via security cameras and Q 15 minute checks.

## 2014-04-28 DIAGNOSIS — F333 Major depressive disorder, recurrent, severe with psychotic symptoms: Secondary | ICD-10-CM

## 2014-04-28 LAB — PROTIME-INR
INR: 1.42 (ref 0.00–1.49)
Prothrombin Time: 17.5 seconds — ABNORMAL HIGH (ref 11.6–15.2)

## 2014-04-28 MED ORDER — WITCH HAZEL-GLYCERIN EX PADS
MEDICATED_PAD | CUTANEOUS | Status: DC | PRN
  Filled 2014-04-28: qty 100

## 2014-04-28 MED ORDER — WARFARIN SODIUM 10 MG PO TABS
10.0000 mg | ORAL_TABLET | Freq: Once | ORAL | Status: AC
  Administered 2014-04-28: 10 mg via ORAL
  Filled 2014-04-28: qty 1

## 2014-04-28 MED ORDER — PHENYLEPHRINE IN HARD FAT 0.25 % RE SUPP
1.0000 | Freq: Two times a day (BID) | RECTAL | Status: DC
  Administered 2014-04-28 – 2014-04-29 (×2): 1 via RECTAL
  Filled 2014-04-28 (×3): qty 1

## 2014-04-28 MED ORDER — METHOCARBAMOL 500 MG PO TABS
500.0000 mg | ORAL_TABLET | Freq: Two times a day (BID) | ORAL | Status: DC
  Administered 2014-04-28 – 2014-04-29 (×2): 500 mg via ORAL
  Filled 2014-04-28 (×2): qty 1

## 2014-04-28 MED ORDER — WARFARIN - PHARMACIST DOSING INPATIENT
Freq: Every day | Status: DC
  Administered 2014-04-28: 18:00:00

## 2014-04-28 NOTE — ED Notes (Signed)
Report received from [facility]. Pt. Alert and oriented in no distress denies SI, HI, AVH.  Pt. States he still hears "Sammy" with no commands. Pt. Instructed to come to me with problems or concerns.Will continue to monitor for safety via security cameras and Q 15 minute checks.

## 2014-04-28 NOTE — BHH Counselor (Signed)
Dale [name], [facility], confirms adult unit is currently at capacity. Contacted the following facilities for placement:  INFORMATION ALREADY FAXED, PT UNDER REVIEW: [facility]  AT CAPACITY: [facility], per [facility], per [facility], per [facility], per [facility], per [facility], per [facility], per [facility], per [facility], per [facility], per Lowella Bandy  NO RESPONSE: [facility]  PT DECLINED: [facility] [address] [place], [place], [facility] Triage Specialist [phone]

## 2014-04-28 NOTE — Progress Notes (Signed)
CSW continued inpatient placement for patient.    Denied: [facility] [place]  CSW obtained Authorization number for patient.    Auth [id number]. Effective Days: 04/28/14 to 05/04/14  CSW faxed information to [facility] and gave demographic information to [place] [facility].  Pending: [facility]   Adelene Amas, LCSW Disposition Social Worker [phone]
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
## 2014-04-29 LAB — PROTIME-INR
INR: 1.54 — AB (ref 0.00–1.49)
Prothrombin Time: 18.7 seconds — ABNORMAL HIGH (ref 11.6–15.2)

## 2014-04-29 MED ORDER — RISPERIDONE 1 MG PO TABS: 1.0000 mg | ORAL_TABLET | Freq: Two times a day (BID) | ORAL | Status: DC

## 2014-04-29 MED ORDER — GABAPENTIN 300 MG PO CAPS: 300.0000 mg | ORAL_CAPSULE | Freq: Every day | ORAL | Status: DC

## 2014-04-29 MED ORDER — BENZTROPINE MESYLATE 0.5 MG PO TABS: 0.5000 mg | ORAL_TABLET | Freq: Every day | ORAL | Status: DC

## 2014-04-29 MED ORDER — PRAZOSIN HCL 2 MG PO CAPS: 2.0000 mg | ORAL_CAPSULE | Freq: Every day | ORAL | Status: DC

## 2014-04-29 NOTE — BHH Counselor (Signed)
Pt received outpatient referral information to follow up at [facility] as well as a suicide prevention packet with crisis numbers.   Kateri Plummer, M.S., LPCA, [facility] Licensed Professional Counselor Associate  Triage Specialist  [facility]  Therapeutic Triage Services Phone: [phone] Fax: [phone]
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
## 2014-05-01 ENCOUNTER — Encounter (HOSPITAL_COMMUNITY): Payer: Self-pay | Admitting: Emergency Medicine

## 2014-05-01 ENCOUNTER — Emergency Department (HOSPITAL_COMMUNITY): Payer: Self-pay

## 2014-05-01 ENCOUNTER — Emergency Department (HOSPITAL_COMMUNITY)
Admission: EM | Admit: 2014-05-01 | Discharge: 2014-05-02 | Disposition: A | Discharge status: Other Acute Inpatient Hospital | Payer: Self-pay | Attending: Emergency Medicine | Admitting: Emergency Medicine

## 2014-05-01 DIAGNOSIS — R079 Chest pain, unspecified: Secondary | ICD-10-CM

## 2014-05-01 DIAGNOSIS — F1410 Cocaine abuse, uncomplicated: Secondary | ICD-10-CM | POA: Insufficient documentation

## 2014-05-01 DIAGNOSIS — R45851 Suicidal ideations: Secondary | ICD-10-CM

## 2014-05-01 DIAGNOSIS — Z79899 Other long term (current) drug therapy: Secondary | ICD-10-CM | POA: Insufficient documentation

## 2014-05-01 DIAGNOSIS — F1110 Opioid abuse, uncomplicated: Secondary | ICD-10-CM | POA: Insufficient documentation

## 2014-05-01 DIAGNOSIS — I10 Essential (primary) hypertension: Secondary | ICD-10-CM | POA: Insufficient documentation

## 2014-05-01 DIAGNOSIS — Z720 Tobacco use: Secondary | ICD-10-CM | POA: Insufficient documentation

## 2014-05-01 DIAGNOSIS — Z872 Personal history of diseases of the skin and subcutaneous tissue: Secondary | ICD-10-CM | POA: Insufficient documentation

## 2014-05-01 DIAGNOSIS — Z7901 Long term (current) use of anticoagulants: Secondary | ICD-10-CM | POA: Insufficient documentation

## 2014-05-01 DIAGNOSIS — Z86718 Personal history of other venous thrombosis and embolism: Secondary | ICD-10-CM | POA: Insufficient documentation

## 2014-05-01 DIAGNOSIS — Z87448 Personal history of other diseases of urinary system: Secondary | ICD-10-CM | POA: Insufficient documentation

## 2014-05-01 DIAGNOSIS — R0602 Shortness of breath: Secondary | ICD-10-CM | POA: Insufficient documentation

## 2014-05-01 DIAGNOSIS — F329 Major depressive disorder, single episode, unspecified: Secondary | ICD-10-CM | POA: Insufficient documentation

## 2014-05-01 DIAGNOSIS — R0789 Other chest pain: Secondary | ICD-10-CM | POA: Insufficient documentation

## 2014-05-01 DIAGNOSIS — Z87828 Personal history of other (healed) physical injury and trauma: Secondary | ICD-10-CM | POA: Insufficient documentation

## 2014-05-01 DIAGNOSIS — F419 Anxiety disorder, unspecified: Secondary | ICD-10-CM | POA: Insufficient documentation

## 2014-05-01 HISTORY — DX: Disorder of kidney and ureter, unspecified: N28.9

## 2014-05-01 LAB — RAPID URINE DRUG SCREEN, HOSP PERFORMED
AMPHETAMINES: NOT DETECTED
BARBITURATES: NOT DETECTED
Benzodiazepines: NOT DETECTED
Cocaine: POSITIVE — AB
Opiates: POSITIVE — AB
Tetrahydrocannabinol: NOT DETECTED

## 2014-05-01 LAB — BASIC METABOLIC PANEL
Anion gap: 7 (ref 5–15)
BUN: 11 mg/dL (ref 6–23)
CHLORIDE: 106 mmol/L (ref 96–112)
CO2: 26 mmol/L (ref 19–32)
Calcium: 9 mg/dL (ref 8.4–10.5)
Creatinine, Ser: 1.59 mg/dL — ABNORMAL HIGH (ref 0.50–1.35)
GFR calc Af Amer: 58 mL/min — ABNORMAL LOW (ref >90)
GFR, EST NON AFRICAN AMERICAN: 50 mL/min — AB (ref >90)
Glucose, Bld: 105 mg/dL — ABNORMAL HIGH (ref 70–99)
POTASSIUM: 5.3 mmol/L — AB (ref 3.5–5.1)
SODIUM: 139 mmol/L (ref 135–145)

## 2014-05-01 LAB — PROTIME-INR
INR: 1.56 — AB (ref 0.00–1.49)
PROTHROMBIN TIME: 18.8 s — AB (ref 11.6–15.2)

## 2014-05-01 LAB — URINALYSIS, ROUTINE W REFLEX MICROSCOPIC
Bilirubin Urine: NEGATIVE
Glucose, UA: NEGATIVE mg/dL
Hgb urine dipstick: NEGATIVE
KETONES UR: 15 mg/dL — AB
LEUKOCYTES UA: NEGATIVE
Nitrite: NEGATIVE
Protein, ur: NEGATIVE mg/dL
SPECIFIC GRAVITY, URINE: 1.01 (ref 1.005–1.030)
UROBILINOGEN UA: 0.2 mg/dL (ref 0.0–1.0)
pH: 7.5 (ref 5.0–8.0)

## 2014-05-01 LAB — SALICYLATE LEVEL: Salicylate Lvl: <4 mg/dL (ref 2.8–20.0)

## 2014-05-01 LAB — TROPONIN I: Troponin I: <0.03 ng/mL (ref <0.031)

## 2014-05-01 LAB — CBC WITH DIFFERENTIAL/PLATELET
Basophils Absolute: 0 10*3/uL (ref 0.0–0.1)
Basophils Relative: 1 % (ref 0–1)
EOS PCT: 3 % (ref 0–5)
Eosinophils Absolute: 0.2 10*3/uL (ref 0.0–0.7)
HEMATOCRIT: 44.8 % (ref 39.0–52.0)
Hemoglobin: 15.7 g/dL (ref 13.0–17.0)
Lymphocytes Relative: 17 % (ref 12–46)
Lymphs Abs: 1.1 10*3/uL (ref 0.7–4.0)
MCH: 30.6 pg (ref 26.0–34.0)
MCHC: 35 g/dL (ref 30.0–36.0)
MCV: 87.3 fL (ref 78.0–100.0)
MONO ABS: 0.5 10*3/uL (ref 0.1–1.0)
Monocytes Relative: 8 % (ref 3–12)
NEUTROS PCT: 73 % (ref 43–77)
Neutro Abs: 4.6 10*3/uL (ref 1.7–7.7)
PLATELETS: 198 10*3/uL (ref 150–400)
RBC: 5.13 MIL/uL (ref 4.22–5.81)
RDW: 13.5 % (ref 11.5–15.5)
WBC: 6.3 10*3/uL (ref 4.0–10.5)

## 2014-05-01 LAB — ETHANOL: Alcohol, Ethyl (B): <5 mg/dL (ref 0–9)

## 2014-05-01 LAB — ACETAMINOPHEN LEVEL: Acetaminophen (Tylenol), Serum: <10 ug/mL — ABNORMAL LOW (ref 10–30)

## 2014-05-01 MED ORDER — WARFARIN - PHYSICIAN DOSING INPATIENT: Freq: Every day | Status: DC

## 2014-05-01 MED ORDER — SIMVASTATIN 20 MG PO TABS
20.0000 mg | ORAL_TABLET | Freq: Every day | ORAL | Status: DC
  Administered 2014-05-01: 20 mg via ORAL
  Filled 2014-05-01 (×2): qty 1

## 2014-05-01 MED ORDER — MORPHINE SULFATE 4 MG/ML IJ SOLN
4.0000 mg | Freq: Once | INTRAMUSCULAR | Status: AC
  Administered 2014-05-01: 4 mg via INTRAVENOUS
  Filled 2014-05-01: qty 1

## 2014-05-01 MED ORDER — ACETAMINOPHEN 325 MG PO TABS
650.0000 mg | ORAL_TABLET | Freq: Once | ORAL | Status: DC
  Filled 2014-05-01: qty 2

## 2014-05-01 MED ORDER — ONDANSETRON HCL 4 MG/2ML IJ SOLN
4.0000 mg | Freq: Once | INTRAMUSCULAR | Status: AC
  Administered 2014-05-01: 4 mg via INTRAVENOUS
  Filled 2014-05-01: qty 2

## 2014-05-01 MED ORDER — ASPIRIN 81 MG PO CHEW
324.0000 mg | CHEWABLE_TABLET | Freq: Once | ORAL | Status: AC
  Administered 2014-05-01: 324 mg via ORAL
  Filled 2014-05-01: qty 4

## 2014-05-01 MED ORDER — WARFARIN SODIUM 7.5 MG PO TABS
7.5000 mg | ORAL_TABLET | Freq: Every day | ORAL | Status: DC
  Filled 2014-05-01: qty 1

## 2014-05-01 MED ORDER — NITROGLYCERIN 0.4 MG SL SUBL
0.4000 mg | SUBLINGUAL_TABLET | SUBLINGUAL | Status: AC | PRN
  Administered 2014-05-01 (×3): 0.4 mg via SUBLINGUAL
  Filled 2014-05-01 (×2): qty 1

## 2014-05-01 MED ORDER — HYDROMORPHONE HCL 1 MG/ML IJ SOLN
0.5000 mg | Freq: Once | INTRAMUSCULAR | Status: AC
  Administered 2014-05-01: 0.5 mg via INTRAVENOUS
  Filled 2014-05-01: qty 1

## 2014-05-01 MED ORDER — WARFARIN SODIUM 7.5 MG PO TABS
7.5000 mg | ORAL_TABLET | Freq: Every day | ORAL | Status: DC
  Administered 2014-05-01: 7.5 mg via ORAL
  Filled 2014-05-01 (×2): qty 1

## 2014-05-01 MED ORDER — RISPERIDONE 0.5 MG PO TABS
1.0000 mg | ORAL_TABLET | Freq: Two times a day (BID) | ORAL | Status: DC
  Administered 2014-05-01 – 2014-05-02 (×3): 1 mg via ORAL
  Filled 2014-05-01 (×2): qty 2

## 2014-05-01 MED ORDER — IOHEXOL 350 MG/ML SOLN
80.0000 mL | Freq: Once | INTRAVENOUS | Status: AC | PRN
  Administered 2014-05-01: 75 mL via INTRAVENOUS

## 2014-05-01 NOTE — ED Notes (Signed)
Pt reports using some unknown substance Monday pm, states he believes they were 5 cigarettes laced with cocaine.

## 2014-05-01 NOTE — Progress Notes (Signed)
CSW followed up with the following inpatient facilities that patient was faxed out:  [facility] - patient on wait list [facility] -  referral pending [facility] - under review Erle Crocker - MD will take a look at it in the am.  [facility] - referral under review  At capacity: [facility]   Will continue to seek placement.  Melbourne Abts, LCSWA Disposition staff 05/01/2014 4:53 PM

## 2014-05-01 NOTE — [facility] Counselor (Signed)
Writer informed TTS Belenda Cruise of the consult.  Writer arranged for the Charge Nurse Lequita Halt) to place the Tele Psych machine in the patients room.
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
## 2014-05-01 NOTE — Progress Notes (Signed)
CSW faxed Pt referral to the following facilities:  [facility]  Will continue to seek placement.  Chad Cordial, LCSWA 05/01/2014 2:23 PM

## 2014-05-01 NOTE — ED Notes (Signed)
Patient at nurses station requesting to have IV removed from Right AC.

## 2014-05-01 NOTE — ED Notes (Signed)
Pt using phone at this time.  
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
## 2014-05-01 NOTE — ED Notes (Signed)
Security called to come wand patient.  Patient placed in paper [place] scrubs.

## 2014-05-01 NOTE — ED Notes (Signed)
Pt thirsty, dr Jodi Mourning notified, OK for water and ice chips. Given to pt.
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
## 2014-05-01 NOTE — ED Notes (Signed)
Pt c/o 9/10 CP on his left side with SOB for almost 3 days, pt having audible Hallucination, and express SI.  Feeling nauseous all day long with some emesis episode.

## 2014-05-01 NOTE — ED Notes (Addendum)
Patient at nurses station very angry that he was unable to get a meal tray at this time.  Patient was provided a lunch tray which patient states he was unable to eat.  This RN stated that snacks were at 3:00pm and dinner would be served around 6:00pm.  Fruit cups and other snacks were offered to the patient until a meal tray would be brought up and designated time.  Patient made aware of Pod C policy.

## 2014-05-01 NOTE — ED Notes (Signed)
Upon offering pt tylenol for his pain, pt became aggitated and stated "thats not gona do anything for my pain, if that's all yall are going to give me then forget it" pt stated "the tylenol didn't help my pain earlier", pt reminded that he refused tylenol earlier as well, this nurse asked pt again if he would like to try the tylenol for his pain, pt refused.

## 2014-05-01 NOTE — ED Notes (Addendum)
pts pain not improved from SL nitro, bp too low to give second SL nitro at this time.Dr. Romeo Apple notified of situation, advised patient can have tylenol for pain at this time and to report any further issues to him as they arise.

## 2014-05-01 NOTE — ED Notes (Signed)
Patient arrived to room. Patient has sitter at the bedside. Patient alert walking around. Patient cooperative.
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
## 2014-05-02 DIAGNOSIS — R0789 Other chest pain: Secondary | ICD-10-CM | POA: Insufficient documentation

## 2014-05-02 DIAGNOSIS — F332 Major depressive disorder, recurrent severe without psychotic features: Secondary | ICD-10-CM

## 2014-05-02 DIAGNOSIS — R45851 Suicidal ideations: Secondary | ICD-10-CM

## 2014-05-02 MED ORDER — PRAZOSIN HCL 2 MG PO CAPS
2.0000 mg | ORAL_CAPSULE | Freq: Every day | ORAL | Status: DC
  Filled 2014-05-02: qty 1

## 2014-05-02 MED ORDER — PANTOPRAZOLE SODIUM 20 MG PO TBEC
20.0000 mg | DELAYED_RELEASE_TABLET | Freq: Two times a day (BID) | ORAL | Status: DC
  Administered 2014-05-02: 20 mg via ORAL
  Filled 2014-05-02 (×4): qty 1

## 2014-05-02 MED ORDER — BENZTROPINE MESYLATE 1 MG PO TABS: 0.5000 mg | ORAL_TABLET | Freq: Every day | ORAL | Status: DC

## 2014-05-02 MED ORDER — GABAPENTIN 300 MG PO CAPS
300.0000 mg | ORAL_CAPSULE | Freq: Three times a day (TID) | ORAL | Status: DC
  Administered 2014-05-02: 300 mg via ORAL
  Filled 2014-05-02: qty 1

## 2014-05-02 NOTE — BH Assessment (Addendum)
Writer called and spoke w/ RN in [place] C who reports that pt has been transferred to [facility]. However, per chart review, pt isn't at [facility] yet. Writer called to speak w/ Charge RN Candise Bowens at [place] but her phone [phone] was busy Hydrographic surveyor has called multiple times).Writer called and spoke w/ Bonita Quin at [place]. Bonita Quin states she is a driver located in [place] and she doesn't have access to [employer] records. She sts their main office in [place] closed at 5 pm. [facility] Berneice Heinrich called and spoke w/ RN at [facility] C who reports pt was transferred to [facility] but then walked out per [employer] (when pt confirmed that he was voluntary).  Evette Cristal, [place] Assessment Counselor

## 2014-05-02 NOTE — ED Notes (Signed)
Pt sleeping at this time. Awakened to assess. Pt denies SI/HI at this time. Does not report any auditory or visual hallucinations. States he feels better than he did yesterday. Will continue to monitor. Research scientist (physical sciences) at bedside.

## 2014-05-02 NOTE — ED Notes (Signed)
[facility] [employer] called and made aware that pt cannot leave. [facility] no longer coming to get pt.

## 2014-05-02 NOTE — ED Notes (Signed)
Dale [name] charge RN called and stated they had a bed available at psych obs.  Rn preparing to get pt ready for transfer.

## 2014-05-02 NOTE — ED Notes (Addendum)
[facility] contacted to give report on pt Dale [name]. Told by Charge RN Candise Bowens that psych unit was at max capacity and that they had psych pt's in regular ED that needed to go back there before anyone else. Was told pt could not be transferred there at this time.
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
## 2014-05-02 NOTE — Progress Notes (Signed)
Patient is still on wait list at [facility].  Will continue to seek placement.  Melbourne Abts, LCSWA Disposition staff 05/02/2014 5:07 PM

## 2014-05-02 NOTE — ED Notes (Signed)
Pt arrived to [facility] via [facility].  Upon entrance into lobby, pt stated that he wanted to leave.  This writer advised Dr. Jeraldine Loots that pt was suicidal while at [facility] however was voluntary at this point.  Dr. Jeraldine Loots advised that because pt was voluntary and pt was not agreeing to be assessed, pt was free to leave.  Pt exited department on his own volition.

## 2014-05-02 NOTE — ED Notes (Addendum)
Pt expressing concern as to why he is not receiving his protonix, neurontin, and minipress. States he kept having nightmares last night and kept hearing voices, the voices woke him up just now. Pt cooperative, took his risperdal.

## 2014-05-02 NOTE — ED Notes (Signed)
Spoke with [facility], still attempting to find placement for pt at this time.

## 2014-05-23 ENCOUNTER — Emergency Department (HOSPITAL_COMMUNITY)
Admission: EM | Admit: 2014-05-23 | Discharge: 2014-05-24 | Disposition: A | Discharge status: Home / Self Care | Payer: Federal, State, Local not specified - Other | Attending: Emergency Medicine | Admitting: Emergency Medicine

## 2014-05-23 ENCOUNTER — Encounter (HOSPITAL_COMMUNITY): Payer: Self-pay | Admitting: *Deleted

## 2014-05-23 DIAGNOSIS — F259 Schizoaffective disorder, unspecified: Secondary | ICD-10-CM | POA: Diagnosis present

## 2014-05-23 DIAGNOSIS — Z87448 Personal history of other diseases of urinary system: Secondary | ICD-10-CM | POA: Insufficient documentation

## 2014-05-23 DIAGNOSIS — R45851 Suicidal ideations: Secondary | ICD-10-CM | POA: Insufficient documentation

## 2014-05-23 DIAGNOSIS — Z79899 Other long term (current) drug therapy: Secondary | ICD-10-CM | POA: Insufficient documentation

## 2014-05-23 DIAGNOSIS — Z86718 Personal history of other venous thrombosis and embolism: Secondary | ICD-10-CM | POA: Insufficient documentation

## 2014-05-23 DIAGNOSIS — F258 Other schizoaffective disorders: Secondary | ICD-10-CM

## 2014-05-23 DIAGNOSIS — Z872 Personal history of diseases of the skin and subcutaneous tissue: Secondary | ICD-10-CM | POA: Insufficient documentation

## 2014-05-23 DIAGNOSIS — Z720 Tobacco use: Secondary | ICD-10-CM | POA: Insufficient documentation

## 2014-05-23 DIAGNOSIS — R440 Auditory hallucinations: Secondary | ICD-10-CM | POA: Insufficient documentation

## 2014-05-23 DIAGNOSIS — Z87820 Personal history of traumatic brain injury: Secondary | ICD-10-CM | POA: Insufficient documentation

## 2014-05-23 DIAGNOSIS — I10 Essential (primary) hypertension: Secondary | ICD-10-CM | POA: Insufficient documentation

## 2014-05-23 DIAGNOSIS — Z7901 Long term (current) use of anticoagulants: Secondary | ICD-10-CM | POA: Insufficient documentation

## 2014-05-23 LAB — COMPREHENSIVE METABOLIC PANEL
ALBUMIN: 3 g/dL — AB (ref 3.5–5.2)
ALK PHOS: 49 U/L (ref 39–117)
ALT: 26 U/L (ref 0–53)
AST: 27 U/L (ref 0–37)
Anion gap: 5 (ref 5–15)
BILIRUBIN TOTAL: 0.4 mg/dL (ref 0.3–1.2)
BUN: 14 mg/dL (ref 6–23)
CHLORIDE: 113 mmol/L — AB (ref 96–112)
CO2: 25 mmol/L (ref 19–32)
Calcium: 8.7 mg/dL (ref 8.4–10.5)
Creatinine, Ser: 1.56 mg/dL — ABNORMAL HIGH (ref 0.50–1.35)
GFR calc Af Amer: 59 mL/min — ABNORMAL LOW (ref >90)
GFR calc non Af Amer: 51 mL/min — ABNORMAL LOW (ref >90)
Glucose, Bld: 113 mg/dL — ABNORMAL HIGH (ref 70–99)
POTASSIUM: 3.6 mmol/L (ref 3.5–5.1)
SODIUM: 143 mmol/L (ref 135–145)
TOTAL PROTEIN: 5.3 g/dL — AB (ref 6.0–8.3)

## 2014-05-23 LAB — SALICYLATE LEVEL

## 2014-05-23 LAB — CBC
HEMATOCRIT: 43.3 % (ref 39.0–52.0)
HEMOGLOBIN: 14.7 g/dL (ref 13.0–17.0)
MCH: 30.6 pg (ref 26.0–34.0)
MCHC: 33.9 g/dL (ref 30.0–36.0)
MCV: 90.2 fL (ref 78.0–100.0)
Platelets: 242 10*3/uL (ref 150–400)
RBC: 4.8 MIL/uL (ref 4.22–5.81)
RDW: 13.6 % (ref 11.5–15.5)
WBC: 5.7 10*3/uL (ref 4.0–10.5)

## 2014-05-23 LAB — PROTIME-INR
INR: 0.99 (ref 0.00–1.49)
Prothrombin Time: 13.2 seconds (ref 11.6–15.2)

## 2014-05-23 LAB — ETHANOL

## 2014-05-23 LAB — ACETAMINOPHEN LEVEL: Acetaminophen (Tylenol), Serum: <10 ug/mL — ABNORMAL LOW (ref 10–30)

## 2014-05-23 MED ORDER — ACETAMINOPHEN 325 MG PO TABS
650.0000 mg | ORAL_TABLET | Freq: Once | ORAL | Status: AC
  Administered 2014-05-23: 650 mg via ORAL
  Filled 2014-05-23: qty 2

## 2014-05-23 NOTE — ED Notes (Signed)
PT c/o of hallucinations, hearing voices; thoughts of SI / HI; pt states that he has had these thoughts for awhile; pt states that he has not had his medications in over a month; pt states that he has been using "drugs"; pt admits to crack use; pt states that he is afraid he will hurt himself or that someone will hurt him; when asked about a plan, pt states "I have no specific plan but just feel like I'd be better off dead"
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
## 2014-05-23 NOTE — ED Notes (Signed)
Pt presents with SI, no specific plan, pt reports he is hearing voices telling him he would be better off dead.  Feeling hopeless, admits to history of PTSD, Bipolar DO.  Pt reports he uses crack cocaine.  Complaint of chronic foot pain also. Pt AAO x 3, cooperative but anxious.  Will monitor for safety.

## 2014-05-24 ENCOUNTER — Inpatient Hospital Stay (HOSPITAL_COMMUNITY)
Admission: EM | Admit: 2014-05-24 | Discharge: 2014-06-03 | DRG: 885 | Disposition: A | Discharge status: Home / Self Care | Payer: Federal, State, Local not specified - Other | Source: Intra-hospital | Attending: Psychiatry | Admitting: Psychiatry

## 2014-05-24 ENCOUNTER — Encounter (HOSPITAL_COMMUNITY): Payer: Self-pay

## 2014-05-24 DIAGNOSIS — I10 Essential (primary) hypertension: Secondary | ICD-10-CM | POA: Diagnosis present

## 2014-05-24 DIAGNOSIS — Z87820 Personal history of traumatic brain injury: Secondary | ICD-10-CM | POA: Diagnosis not present

## 2014-05-24 DIAGNOSIS — F17210 Nicotine dependence, cigarettes, uncomplicated: Secondary | ICD-10-CM | POA: Diagnosis present

## 2014-05-24 DIAGNOSIS — F258 Other schizoaffective disorders: Secondary | ICD-10-CM

## 2014-05-24 DIAGNOSIS — E785 Hyperlipidemia, unspecified: Secondary | ICD-10-CM | POA: Diagnosis present

## 2014-05-24 DIAGNOSIS — G4700 Insomnia, unspecified: Secondary | ICD-10-CM | POA: Diagnosis present

## 2014-05-24 DIAGNOSIS — Z79899 Other long term (current) drug therapy: Secondary | ICD-10-CM

## 2014-05-24 DIAGNOSIS — K219 Gastro-esophageal reflux disease without esophagitis: Secondary | ICD-10-CM | POA: Diagnosis present

## 2014-05-24 DIAGNOSIS — F4310 Post-traumatic stress disorder, unspecified: Secondary | ICD-10-CM | POA: Diagnosis present

## 2014-05-24 DIAGNOSIS — F1420 Cocaine dependence, uncomplicated: Secondary | ICD-10-CM | POA: Diagnosis present

## 2014-05-24 DIAGNOSIS — Z9119 Patient's noncompliance with other medical treatment and regimen: Secondary | ICD-10-CM | POA: Diagnosis present

## 2014-05-24 DIAGNOSIS — F333 Major depressive disorder, recurrent, severe with psychotic symptoms: Secondary | ICD-10-CM | POA: Diagnosis present

## 2014-05-24 DIAGNOSIS — Z86711 Personal history of pulmonary embolism: Secondary | ICD-10-CM | POA: Diagnosis not present

## 2014-05-24 DIAGNOSIS — F3164 Bipolar disorder, current episode mixed, severe, with psychotic features: Principal | ICD-10-CM | POA: Diagnosis present

## 2014-05-24 DIAGNOSIS — F259 Schizoaffective disorder, unspecified: Secondary | ICD-10-CM | POA: Diagnosis present

## 2014-05-24 LAB — RAPID URINE DRUG SCREEN, HOSP PERFORMED
AMPHETAMINES: NOT DETECTED
Barbiturates: NOT DETECTED
Benzodiazepines: NOT DETECTED
COCAINE: POSITIVE — AB
OPIATES: NOT DETECTED
TETRAHYDROCANNABINOL: NOT DETECTED

## 2014-05-24 MED ORDER — GABAPENTIN 400 MG PO CAPS
400.0000 mg | ORAL_CAPSULE | Freq: Three times a day (TID) | ORAL | Status: DC
  Administered 2014-05-24 – 2014-05-25 (×2): 400 mg via ORAL
  Filled 2014-05-24 (×7): qty 1

## 2014-05-24 MED ORDER — GABAPENTIN 300 MG PO CAPS
300.0000 mg | ORAL_CAPSULE | Freq: Three times a day (TID) | ORAL | Status: DC
  Administered 2014-05-24: 300 mg via ORAL
  Filled 2014-05-24: qty 1

## 2014-05-24 MED ORDER — TRAMADOL HCL 50 MG PO TABS
50.0000 mg | ORAL_TABLET | Freq: Four times a day (QID) | ORAL | Status: DC
  Administered 2014-05-24 – 2014-05-25 (×2): 50 mg via ORAL
  Filled 2014-05-24 (×2): qty 1

## 2014-05-24 MED ORDER — WARFARIN SODIUM 10 MG PO TABS
10.0000 mg | ORAL_TABLET | Freq: Once | ORAL | Status: AC
  Administered 2014-05-24: 10 mg via ORAL
  Filled 2014-05-24: qty 1

## 2014-05-24 MED ORDER — TRAMADOL HCL 50 MG PO TABS
50.0000 mg | ORAL_TABLET | Freq: Once | ORAL | Status: AC
  Administered 2014-05-24: 50 mg via ORAL
  Filled 2014-05-24: qty 1

## 2014-05-24 MED ORDER — SIMVASTATIN 20 MG PO TABS
20.0000 mg | ORAL_TABLET | Freq: Every day | ORAL | Status: DC
  Filled 2014-05-24: qty 1

## 2014-05-24 MED ORDER — ALUM & MAG HYDROXIDE-SIMETH 200-200-20 MG/5ML PO SUSP: 30.0000 mL | ORAL | Status: DC | PRN

## 2014-05-24 MED ORDER — ACETAMINOPHEN 325 MG PO TABS: 650.0000 mg | ORAL_TABLET | Freq: Four times a day (QID) | ORAL | Status: DC | PRN

## 2014-05-24 MED ORDER — PRAZOSIN HCL 2 MG PO CAPS
2.0000 mg | ORAL_CAPSULE | Freq: Every day | ORAL | Status: DC
  Administered 2014-05-24: 2 mg via ORAL
  Filled 2014-05-24 (×2): qty 1

## 2014-05-24 MED ORDER — TRAZODONE HCL 50 MG PO TABS
50.0000 mg | ORAL_TABLET | Freq: Every evening | ORAL | Status: DC | PRN
  Filled 2014-05-24 (×2): qty 1

## 2014-05-24 MED ORDER — MAGNESIUM HYDROXIDE 400 MG/5ML PO SUSP: 30.0000 mL | Freq: Every day | ORAL | Status: DC | PRN

## 2014-05-24 MED ORDER — PANTOPRAZOLE SODIUM 20 MG PO TBEC
20.0000 mg | DELAYED_RELEASE_TABLET | Freq: Two times a day (BID) | ORAL | Status: DC
  Administered 2014-05-24: 20 mg via ORAL
  Filled 2014-05-24 (×3): qty 1

## 2014-05-24 MED ORDER — BENZTROPINE MESYLATE 1 MG PO TABS
0.5000 mg | ORAL_TABLET | Freq: Every day | ORAL | Status: DC
  Administered 2014-05-24: 0.5 mg via ORAL
  Filled 2014-05-24: qty 1

## 2014-05-24 MED ORDER — WARFARIN - PHARMACIST DOSING INPATIENT
Freq: Every day | Status: DC
  Administered 2014-05-25 – 2014-06-02 (×3)
  Filled 2014-05-24 (×17): qty 1

## 2014-05-24 MED ORDER — HALOPERIDOL 5 MG PO TABS: 5.0000 mg | ORAL_TABLET | Freq: Four times a day (QID) | ORAL | Status: DC | PRN

## 2014-05-24 MED ORDER — BENZTROPINE MESYLATE 1 MG PO TABS: 1.0000 mg | ORAL_TABLET | Freq: Four times a day (QID) | ORAL | Status: DC | PRN

## 2014-05-24 MED ORDER — TRAMADOL HCL 50 MG PO TABS: 50.0000 mg | ORAL_TABLET | Freq: Once | ORAL | Status: DC
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
## 2014-05-24 NOTE — BH Assessment (Signed)
[facility] Assessment Progress Note  Per Thedore Mins, MD this pt requires psychiatric hospitalization at this time.  Thurman Coyer, RN, [facility] has assigned pt to Rm [id number].  Pt has signed Voluntary Admission and Consent for Treatment, as well as Consent to Release Information, and a notification call has been made.  Signed forms have been faxed to [facility].  Pt's nurse, Carlisle Beers, has been notified.  She agrees to send original paperwork along with pt via Juel Burrow, and to call report to [phone].  Doylene Canning, MA Triage Specialist 05/24/2014 @ 10:54

## 2014-05-24 NOTE — Progress Notes (Addendum)
Patient ID: Dale [name], male   DOB: 06-21-1965, 49 y.o.   MRN: [id number]  49 year old male presents to [facility] from [facility] ED. Pt presents with a flat affect and depressed behavior. Pt reports "I'm feeling really anxious and agitated. I'm having pain in my foot, I have rods in it." When asked when he noticed the increase in agitation, he stated "since I woke up." Pt endorses hearing auditory hallucinations prior to admission to [facility] ED and that he "was thinking about killing myself." Pt states "I haven't taken any medications in 15 days and I was hearing voices." Pt is a poor historian due to agitation and irritability. When asked what pt would like to accomplish while being at [facility], pt stated "I just want to lay down, I'm not sure right now." Pt oriented to unit, consents signed and skin/belongings search completed. Pt given a meal. Providers notified of increased pain and agitation. Will continue to monitor.
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
## 2014-05-24 NOTE — Tx Team (Signed)
Initial Interdisciplinary Treatment Plan   PATIENT STRESSORS: Financial difficulties Medication change or noncompliance Substance abuse   PATIENT STRENGTHS: Ability for insight Average or above average intelligence Capable of independent living   PROBLEM LIST: Problem List/Patient Goals Date to be addressed Date deferred Reason deferred Estimated date of resolution  "the voices were getting worse " 05/24/2014  05/24/2014    "I couldn't get my medications" 05/24/2014 05/24/2014    "I was thinking about killing myself 05/24/2014 05/24/2014                                         DISCHARGE CRITERIA:  Ability to meet basic life and health needs Adequate post-discharge living arrangements Improved stabilization in mood, thinking, and/or behavior  PRELIMINARY DISCHARGE PLAN: Attend 12-step recovery group Outpatient therapy Placement in alternative living arrangements  PATIENT/FAMIILY INVOLVEMENT: This treatment plan has been presented to and reviewed with the patient, Emmanuelle Connally.The patient and family have been given the opportunity to ask questions and make suggestions.  Aurora Mask 05/24/2014, 1:55 PM

## 2014-05-25 ENCOUNTER — Encounter (HOSPITAL_COMMUNITY): Payer: Self-pay | Admitting: Psychiatry

## 2014-05-25 DIAGNOSIS — F1420 Cocaine dependence, uncomplicated: Secondary | ICD-10-CM

## 2014-05-25 DIAGNOSIS — F4310 Post-traumatic stress disorder, unspecified: Secondary | ICD-10-CM

## 2014-05-25 DIAGNOSIS — F3164 Bipolar disorder, current episode mixed, severe, with psychotic features: Secondary | ICD-10-CM | POA: Diagnosis present

## 2014-05-25 LAB — PROTIME-INR
INR: 1.01 (ref 0.00–1.49)
PROTHROMBIN TIME: 13.4 s (ref 11.6–15.2)

## 2014-05-25 MED ORDER — TRAMADOL HCL 50 MG PO TABS
50.0000 mg | ORAL_TABLET | Freq: Three times a day (TID) | ORAL | Status: DC | PRN
  Administered 2014-05-25 – 2014-06-03 (×16): 50 mg via ORAL
  Filled 2014-05-25 (×16): qty 1

## 2014-05-25 MED ORDER — GABAPENTIN 400 MG PO CAPS
400.0000 mg | ORAL_CAPSULE | ORAL | Status: DC
  Administered 2014-05-25 – 2014-05-29 (×12): 400 mg via ORAL
  Filled 2014-05-25 (×18): qty 1

## 2014-05-25 MED ORDER — LIDOCAINE 5 % EX PTCH
1.0000 | MEDICATED_PATCH | Freq: Every day | CUTANEOUS | Status: DC
  Administered 2014-05-25 – 2014-06-03 (×10): 1 via TRANSDERMAL
  Filled 2014-05-25 (×13): qty 1

## 2014-05-25 MED ORDER — DOCUSATE SODIUM 100 MG PO CAPS
100.0000 mg | ORAL_CAPSULE | Freq: Two times a day (BID) | ORAL | Status: DC | PRN
  Administered 2014-05-31: 100 mg via ORAL

## 2014-05-25 MED ORDER — GLYCERIN (LAXATIVE) 2.1 G RE SUPP
1.0000 | Freq: Every day | RECTAL | Status: DC | PRN
  Administered 2014-05-25 – 2014-06-02 (×5): 1 via RECTAL
  Filled 2014-05-25 (×8): qty 1

## 2014-05-25 MED ORDER — WARFARIN SODIUM 10 MG PO TABS
10.0000 mg | ORAL_TABLET | Freq: Once | ORAL | Status: AC
  Administered 2014-05-25: 10 mg via ORAL
  Filled 2014-05-25: qty 1

## 2014-05-25 MED ORDER — RISPERIDONE 1 MG PO TBDP
1.0000 mg | ORAL_TABLET | ORAL | Status: DC
  Administered 2014-05-25 – 2014-05-26 (×3): 1 mg via ORAL
  Filled 2014-05-25 (×8): qty 1

## 2014-05-25 MED ORDER — MAGNESIUM CITRATE PO SOLN
1.0000 | Freq: Once | ORAL | Status: AC
  Administered 2014-05-25: 1 via ORAL

## 2014-05-25 MED ORDER — BENZTROPINE MESYLATE 0.5 MG PO TABS
0.5000 mg | ORAL_TABLET | ORAL | Status: DC
  Administered 2014-05-25 – 2014-05-26 (×3): 0.5 mg via ORAL
  Filled 2014-05-25 (×8): qty 1

## 2014-05-25 NOTE — Progress Notes (Signed)
ANTICOAGULATION CONSULT NOTE - Follow Up Consult  Pharmacy Consult for Couamdin Indication:h/o PE  Allergies  Allergen Reactions  . Ibuprofen Hives    Labs:  Recent Labs  05/23/14 2153 05/23/14 2205 05/25/14 0630  HGB 14.7  --   --   HCT 43.3  --   --   PLT 242  --   --   LABPROT  --  13.2 13.4  INR  --  0.99 1.01  CREATININE 1.56*  --   --     CrCl cannot be calculated (Unknown ideal weight.).   Medications:  Scheduled:  . risperiDONE  1 mg Oral BH-qamhs   And  . benztropine  0.5 mg Oral BH-qamhs  . gabapentin  400 mg Oral [id number]  . lidocaine  1 patch Transdermal Daily  . magnesium citrate  1 Bottle Oral Once  . warfarin  10 mg Oral ONCE-1800  . Warfarin - Pharmacist Dosing Inpatient   Does not apply q1800    Assessment: INR below goal Goal of Therapy:  INR 2-3    Plan:  Coumadin 10 mg x 1 today  PT/INR in AM   Charyl Dancer 05/25/2014,1:22 PM

## 2014-05-25 NOTE — Progress Notes (Signed)
Patient spent most time in his room lying down. Refused to talk to this Clinical research associate the first time Clinical research associate walked into his room. At 22:00, writer walked into patients room, patient opened his eyes and closed it again. Patient responded only to "YES or NO" answers few times then told this writer to leave him alone. Safety maintained by every 15 minutes check. Will continue to monitor patient for safety and stability.

## 2014-05-25 NOTE — [facility] Group Notes (Signed)
[facility] Group Notes:  (Nursing/MHT/Case Management/Adjunct)  Date:  05/25/2014  Time:  10:32 AM  Type of Therapy:  Psychoeducational Skills  Participation Level:  Minimal  Participation Quality:  Inattentive  Affect:  Irritable  Cognitive:  Appropriate  Insight:  Lacking  Engagement in Group:  Lacking and Limited  Modes of Intervention:  Discussion and Education  Summary of Progress/Problems:goal group '' pt states get thoughts together and finding out what's inside of him and his next step'' for his goal.  Malva Limes 05/25/2014, 10:32 AM
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
## 2014-05-25 NOTE — BHH Counselor (Signed)
Mistaken entry - meant for 05/24/14 admission.

## 2014-05-25 NOTE — Progress Notes (Signed)
The focus of this group is to help patients review their daily goal of treatment and discuss progress on daily workbooks. Pt attended the evening group session and responded to all discussion prompts from the Writer. Pt reported having had a difficult day on the unit between the voices in his head and foot pain. Pt also complained of another Pt on the unit bullying him, but that his peers stood up for him, which he appreciated. "There's good people on this hall, patients and staff." Pt then told the group his goal for the coming week was to get into long term care for his substance abuse issues. Pt appeared anxious and pained in group, but kept a good attitude.

## 2014-05-25 NOTE — BHH Group Notes (Signed)
B[facility] Group Notes:  (Clinical Social Work)  05/25/2014  11:15-12:00PM  Summary of Progress/Problems:   The main focus of today's process group was to discuss patients' feelings about hospitalization, the stigma attached to mental health, and sources of motivation to stay well.  We then worked to identify a specific plan to avoid future hospitalizations when discharged from the hospital for this admission.  The patient expressed little during group except to say that he has a lot going on right now, and he is not having any particular feelings about being hospitalized, that he is confused more than anything else.  He made a lot of deep sighs throughout group and was in pain in his leg which he had propped up.    Type of Therapy:  Group Therapy - Process  Participation Level:  Minimal  Participation Quality:  Inattentive and Resistant  Affect:  Angry and Flat  Cognitive:  Confused  Insight:  Lacking  Engagement in Therapy:  Limited  Modes of Intervention:  Exploration, Discussion  Ambrose Mantle, LCSW 05/25/2014, 12:44 PM
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
## 2014-05-26 LAB — PROTIME-INR
INR: 1.02 (ref 0.00–1.49)
Prothrombin Time: 13.5 seconds (ref 11.6–15.2)

## 2014-05-26 MED ORDER — BENZTROPINE MESYLATE 0.5 MG PO TABS
0.5000 mg | ORAL_TABLET | ORAL | Status: DC
  Administered 2014-05-26 – 2014-05-28 (×4): 0.5 mg via ORAL
  Filled 2014-05-26 (×6): qty 1

## 2014-05-26 MED ORDER — RISPERIDONE 2 MG PO TBDP
2.0000 mg | ORAL_TABLET | ORAL | Status: DC
  Administered 2014-05-26 – 2014-05-28 (×4): 2 mg via ORAL
  Filled 2014-05-26: qty 1
  Filled 2014-05-26: qty 2
  Filled 2014-05-26 (×5): qty 1

## 2014-05-26 MED ORDER — HYDROCORTISONE 1 % EX CREA
TOPICAL_CREAM | CUTANEOUS | Status: DC | PRN
  Filled 2014-05-26: qty 28

## 2014-05-26 MED ORDER — WARFARIN SODIUM 10 MG PO TABS
10.0000 mg | ORAL_TABLET | Freq: Once | ORAL | Status: AC
  Administered 2014-05-26: 10 mg via ORAL
  Filled 2014-05-26: qty 1
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
## 2014-05-26 NOTE — Progress Notes (Signed)
D)  Has been in the dayroom and attended group this evening, and participated.  Expressed appreciation to peers and staff who have been supportive today after a peer was making derogatory comments.  Came to the med window after group to get hs meds, was also given ultram for pain in his foot.  Has been walking with his walker tonight which has helped relieve the pain to a degree.  Stated was tired and appeared to be.  Stated was going to bed after meds, which he did.  ] A)  Support, will continue to monitor for safety, continue POC R)  Safety maintained.
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
## 2014-05-26 NOTE — BHH Group Notes (Signed)
BHH Group Notes: (Clinical Social Work)   05/26/2014      Type of Therapy:  Group Therapy   Participation Level:  Did Not Attend despite MHT prompting - he did arrive prior to the end of group and laughed, enjoyed the last song.  Ambrose Mantle, LCSW 05/26/2014, 1:03 PM

## 2014-05-26 NOTE — Progress Notes (Signed)
ANTICOAGULATION CONSULT NOTE - Follow Up Consult  Pharmacy Consult for Coumadin Indication: h/o clots  Allergies  Allergen Reactions  . Ibuprofen Hives     Labs:  Recent Labs  05/23/14 2153 05/23/14 2205 05/25/14 0630 05/26/14 0640  HGB 14.7  --   --   --   HCT 43.3  --   --   --   PLT 242  --   --   --   LABPROT  --  13.2 13.4 13.5  INR  --  0.99 1.01 1.02  CREATININE 1.56*  --   --   --     CrCl cannot be calculated (Unknown ideal weight.).   Medications:  Scheduled:  . risperiDONE  1 mg Oral BH-qamhs   And  . benztropine  0.5 mg Oral BH-qamhs  . gabapentin  400 mg Oral BH-q8a3phs  . lidocaine  1 patch Transdermal Daily  . warfarin  10 mg Oral ONCE-1800  . Warfarin - Pharmacist Dosing Inpatient   Does not apply q1800    Assessment: Very samll increase with coumadin 10 mg.  No problems noted  Goal of Therapy:  INR 2-3    Plan:  Coumadin 10 mg x 1  PT/INR in am,   Charyl Dancer 05/26/2014,10:30 AM

## 2014-05-26 NOTE — Progress Notes (Signed)
Did not attend group 

## 2014-05-27 LAB — PROTIME-INR
INR: 1.5 — ABNORMAL HIGH (ref 0.00–1.49)
PROTHROMBIN TIME: 18.3 s — AB (ref 11.6–15.2)

## 2014-05-27 MED ORDER — WARFARIN SODIUM 10 MG PO TABS
10.0000 mg | ORAL_TABLET | Freq: Once | ORAL | Status: AC
  Administered 2014-05-27: 10 mg via ORAL
  Filled 2014-05-27: qty 1

## 2014-05-27 NOTE — [facility] Group Notes (Signed)
[facility] LCSW Group Therapy  05/27/2014   1:15 PM   Type of Therapy:  Group Therapy  Participation Level:  Minimal  Participation Quality:  Minimal  Affect:  Depressed and Flat  Cognitive:  Drowsy  Insight: Lacking  Engagement in Therapy:  Lacking  Modes of Intervention:  Clarification, Confrontation, Discussion, Education, Exploration, Limit-setting, Orientation, Problem-solving, Rapport Building, Dance movement psychotherapist, Socialization and Support  Summary of Progress/Problems: Pt identified obstacles faced currently and processed barriers involved in overcoming these obstacles. Pt identified steps necessary for overcoming these obstacles and explored motivation (internal and external) for facing these difficulties head on. Pt further identified one area of concern in their lives and chose a goal to focus on for today. Pt slept through the duration of group and did not contribute to group discussion.   Dale Ivan, LCSW 05/27/2014  2:31 PM

## 2014-05-27 NOTE — Tx Team (Signed)
Interdisciplinary Treatment Plan Update (Adult)   Date: 05/27/2014   Time Reviewed: 10:00AM Progress in Treatment:  Attending groups: No Participating in groups:  No  Taking medication as prescribed: Yes  Tolerating medication: Yes  Family/Significant othe contact made: Not yet. SPE required for this pt.   Patient understands diagnosis: Yes, AEB seeking treatment for AH/voices telling him to harm/kill himself, SI with plan to walk in front of traffic, med noncompliance, paranoia, and crack cocaine abuse x65month.  Discussing patient identified problems/goals with staff: Yes  Medical problems stabilized or resolved: Yes  Denies suicidal/homicidal ideation: Yes during self report today.  Patient has not harmed self or Others: Yes  New problem(s) identified:  Discharge Plan or Barriers: CSW assessing for appropriate referrals at this time. PSA completed. Additional comments: Dale [name] is a 49 y.o. AA male patient admitted with Auditory/visual hallucination. The voices were intensifying, telling him to kill himself and others. He is known to [facility]. His last admission was 02/2014. Dale [name] states that he has not taken his medications for a month. Patient stated that he could not afford his medications. Patient stated that he uses Cocaine to treat his mental illness. Patient is unable to contract for safety and has been accepted for admission and has a bed assigned. Dale [name] is also complaining of pain to his bilateral feet. Describes it as heat, tingling and pain  Reason for Continuation of Hospitalization: AH Mood instability/depression Medication management Estimated length of stay: 4-6 days  For review of initial/current patient goals, please see plan of care.  Attendees:  Patient:    Family:    Physician: Dale Lyons MD/Dale [name] 05/27/2014   Nursing: Dale [name], Dale [name], Donna RN 05/27/2014   Clinical Social Worker Dale [name], LCSWA  05/27/2014   Other: Dale [name]; Dale [name]  05/27/2014   Other: Dale [name] Nurse CM 05/27/2014   Other: Dale [name], Community Care Coordinator  05/27/2014   Other: Dale [name]; [facility]  05/27/2014   Scribe for Treatment Team:  Dale [name] LCSWA 05/27/2014 10:00AM

## 2014-05-27 NOTE — Progress Notes (Signed)
ANTICOAGULATION CONSULT NOTE - Follow Up Consult  Pharmacy Consult for Coumadin Indication: h/o clots  Allergies  Allergen Reactions  . Ibuprofen Hives    Labs:  Recent Labs  05/25/14 0630 05/26/14 0640 05/27/14 0613  LABPROT 13.4 13.5 18.3*  INR 1.01 1.02 1.50*    CrCl cannot be calculated (Unknown ideal weight.).   Medications:  Scheduled:  . risperiDONE  2 mg Oral BH-qamhs   And  . benztropine  0.5 mg Oral BH-qamhs  . gabapentin  400 mg Oral BH-q8a3phs  . lidocaine  1 patch Transdermal Daily  . Warfarin - Pharmacist Dosing Inpatient   Does not apply q1800    Assessment: INR increased to 1.5 today no problems noted with therapy noted Goal of Therapy:  INR 2-3    Plan:  Coumadin 10 mg x 1 today  PT/INR in am   Dale [name] 05/27/2014,11:49 AM

## 2014-05-27 NOTE — Plan of Care (Signed)
Problem: Ineffective individual coping Goal: STG: Patient will remain free from self harm Outcome: Progressing Patient has not engaged in self harm and denies SI.  Problem: Alteration in mood & ability to function due to Goal: STG-Pt will be introduced to the 12-step program of recovery (Patient will be introduced to the 12-step program of recovery and disease concept of addiction)  Outcome: Progressing Patient attending groups on [address] hall (substance abuse/AA).
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
## 2014-05-27 NOTE — Progress Notes (Signed)
D: Pt presents flat in affect and depressed in mood. Pt was guarded in interaction with Clinical research associate. Pt remained in his room for the majority of the shift. Pt reports having VH. Pt asked writer about his Minipress not being ordered for QHS. Pt reported that he did not speak with the psychiatrist because it was listed under his medication history. Writer informed pt that the on-call provider may add his Minipress but a Bp is required before administration. Pt requested for writer to not bother him if was asleep. Pt eyes were opened on follow-up but he declined for his BP to be done. Pt plans to speak with his psychiatrist in the morning. Pt denied any SI/HI/AH. Pt is compliant with his medications and shows awareness of the indications.  A: Writer administered scheduled medications to pt, per Md orders. Continued support and availability as needed was extended to this pt. Staff continue to monitor pt with q63min checks.  R: No adverse drug reactions noted. Pt receptive to treatment. Pt remains safe at this time.
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
## 2014-05-28 LAB — PROTIME-INR
INR: 1.5 — AB (ref 0.00–1.49)
PROTHROMBIN TIME: 18.3 s — AB (ref 11.6–15.2)

## 2014-05-28 MED ORDER — TRAZODONE HCL 50 MG PO TABS
50.0000 mg | ORAL_TABLET | Freq: Every evening | ORAL | Status: DC | PRN
  Administered 2014-05-28: 50 mg via ORAL
  Filled 2014-05-28: qty 1

## 2014-05-28 MED ORDER — BENZTROPINE MESYLATE 0.5 MG PO TABS
0.5000 mg | ORAL_TABLET | ORAL | Status: DC
  Administered 2014-05-28 – 2014-05-29 (×2): 0.5 mg via ORAL
  Filled 2014-05-28 (×6): qty 1

## 2014-05-28 MED ORDER — PRAZOSIN HCL 2 MG PO CAPS
2.0000 mg | ORAL_CAPSULE | Freq: Every day | ORAL | Status: DC
  Administered 2014-05-28 – 2014-06-02 (×5): 2 mg via ORAL
  Filled 2014-05-28 (×7): qty 1

## 2014-05-28 MED ORDER — TRAZODONE HCL 150 MG PO TABS
150.0000 mg | ORAL_TABLET | Freq: Every day | ORAL | Status: DC
  Filled 2014-05-28 (×2): qty 1

## 2014-05-28 MED ORDER — WARFARIN SODIUM 10 MG PO TABS
10.0000 mg | ORAL_TABLET | Freq: Once | ORAL | Status: AC
  Administered 2014-05-28: 10 mg via ORAL
  Filled 2014-05-28: qty 1

## 2014-05-28 MED ORDER — RISPERIDONE 1 MG PO TBDP
3.0000 mg | ORAL_TABLET | ORAL | Status: DC
Start: 2014-05-28 — End: 2014-05-29
  Administered 2014-05-28 – 2014-05-29 (×2): 3 mg via ORAL
  Filled 2014-05-28 (×10): qty 1

## 2014-05-28 NOTE — Progress Notes (Signed)
ANTICOAGULATION CONSULT NOTE - Follow Up Consult  Pharmacy Consult for coumadin Indication: h/o clots  Allergies  Allergen Reactions  . Ibuprofen Hives     Vital Signs: Temp: 97.6 F (36.4 C) (02/23 0700) Temp Source: Oral (02/23 0700) BP: 113/100 mmHg (02/23 0701) Pulse Rate: 95 (02/23 0701)  Labs:  Recent Labs  05/26/14 0640 05/27/14 0613 05/28/14 0625  LABPROT 13.5 18.3* 18.3*  INR 1.02 1.50* 1.50*    CrCl cannot be calculated (Unknown ideal weight.).   Medications:  Scheduled:  . risperiDONE  3 mg Oral BH-qamhs   And  . benztropine  0.5 mg Oral BH-qamhs  . gabapentin  400 mg Oral BH-q8a3phs  . lidocaine  1 patch Transdermal Daily  . prazosin  2 mg Oral QHS  . traZODone  150 mg Oral QHS  . warfarin  10 mg Oral ONCE-1800  . Warfarin - Pharmacist Dosing Inpatient   Does not apply q1800    Assessment: INR below goal of 2-3  No problems with therapy noted  Goal of Therapy:  INR 2-3    Plan:  Coumadin 10 mg x 1 today at 1800  PT/INR in am   Charyl Dancer 05/28/2014,2:00 PM

## 2014-05-28 NOTE — Progress Notes (Signed)
PT Note  Patient Details Name: Dale [name] MRN: [id number] DOB: [birth year]        Chart reviewed and noted pt in a more cooperative state to attempt to address PT evaluation. Did call over to speak with nurse , who stated pt is up using RW in hallways, and she was going to discuss the if there was still a need for a PT assessment with the MD.   Please let us know if you would still like a PT assessment. Thank you,    Marella Bile 05/28/2014, 11:38 AM  Marella Bile, PT Pager: [phone] 05/28/2014

## 2014-05-28 NOTE — Progress Notes (Signed)
Adult Psychoeducational Group Note  Date:  05/28/2014 Time:  8:50 PM  Group Topic/Focus:  Wrap-Up Group:   The focus of this group is to help patients review their daily goal of treatment and discuss progress on daily workbooks.  Participation Level:  Active  Participation Quality:  Appropriate  Affect:  Appropriate  Cognitive:  Appropriate  Insight: Appropriate  Engagement in Group:  Engaged  Modes of Intervention:  Discussion  Additional Comments:  The patient  Expressed that he did not attend group. Octavio Manns 05/28/2014, 8:50 PM
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
## 2014-05-28 NOTE — Clinical Social Work Note (Signed)
CSW submitted referral to [facility] today 05/28/14. Pt denied at [facility] due to primary dx of mental illness, rather than substance abuse. Pt has no transportation to [facility], therefore, no referral made. Pt would like to continue outpatient mental health follow-up for med management and therapy at [facility] of the [place]. CSW assessing for additional potential resources for pt.  [employer], LCSWA 05/28/2014 12:40 PM
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
## 2014-05-28 NOTE — [facility] Suicide Risk Assessment (Signed)
[facility] INPATIENT:  Family/Significant Other Suicide Prevention Education  Suicide Prevention Education:  Patient Refusal for Family/Significant Other Suicide Prevention Education: The patient Dale [name] has refused to provide written consent for family/significant other to be provided Family/Significant Other Suicide Prevention Education during admission and/or prior to discharge.  Physician notified.  SPE completed with pt. SPI pamphlet provided to pt and he was encouraged to share information with support network, ask questions, and talk about any concerns relating to SPE.  Smart, Brigham Cobbins LCSWA  05/28/2014, 12:42 PM

## 2014-05-28 NOTE — [facility] Group Notes (Signed)
The focus of this group is to educate the patient on the purpose and policies of crisis stabilization and provide a format to answer questions about their admission.  The group details unit policies and expectations of patients while admitted. Patient did attend this group but did not engage.

## 2014-05-28 NOTE — [facility] Group Notes (Signed)
[facility] LCSW Group Therapy  05/28/2014 1:15 PM   Type of Therapy: Group Therapy  Participation Level: Did not attend  Summary of Progress/Problems: The topic for group therapy was feelings about diagnosis.    Carney Bern, LCSW

## 2014-05-29 LAB — PROTIME-INR
INR: 2.05 — ABNORMAL HIGH (ref 0.00–1.49)
Prothrombin Time: 23.3 seconds — ABNORMAL HIGH (ref 11.6–15.2)

## 2014-05-29 MED ORDER — WARFARIN SODIUM 7.5 MG PO TABS
7.5000 mg | ORAL_TABLET | Freq: Once | ORAL | Status: AC
  Administered 2014-05-29: 7.5 mg via ORAL
  Filled 2014-05-29: qty 1

## 2014-05-29 MED ORDER — GABAPENTIN 300 MG PO CAPS
600.0000 mg | ORAL_CAPSULE | ORAL | Status: DC
  Administered 2014-05-29 – 2014-06-03 (×15): 600 mg via ORAL
  Filled 2014-05-29 (×21): qty 2

## 2014-05-29 MED ORDER — RISPERIDONE 2 MG PO TBDP
4.0000 mg | ORAL_TABLET | Freq: Every day | ORAL | Status: DC
  Administered 2014-05-29 – 2014-06-02 (×5): 4 mg via ORAL
  Filled 2014-05-29 (×7): qty 2

## 2014-05-29 MED ORDER — BENZTROPINE MESYLATE 0.5 MG PO TABS
0.5000 mg | ORAL_TABLET | ORAL | Status: DC
  Administered 2014-05-29 – 2014-06-03 (×10): 0.5 mg via ORAL
  Filled 2014-05-29 (×14): qty 1

## 2014-05-29 MED ORDER — RISPERIDONE 1 MG PO TBDP
3.0000 mg | ORAL_TABLET | Freq: Every day | ORAL | Status: DC
  Administered 2014-05-30 – 2014-06-03 (×5): 3 mg via ORAL
  Filled 2014-05-29 (×7): qty 3

## 2014-05-29 MED ORDER — TRAZODONE HCL 100 MG PO TABS
100.0000 mg | ORAL_TABLET | Freq: Every day | ORAL | Status: DC
  Administered 2014-05-29 – 2014-05-30 (×2): 100 mg via ORAL
  Filled 2014-05-29 (×3): qty 1
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
## 2014-05-29 NOTE — [facility] Group Notes (Signed)
[facility] LCSW Group Therapy  05/29/2014 1:27 PM  Type of Therapy:  Group Therapy  Participation Level:  Minimal   Participation Quality:  Drowsy  Affect:  Flat  Cognitive:  Appropriate  Insight:  Limited  Engagement in Therapy:  Limited  Modes of Intervention:  Education, Socialization and Support  Summary of Progress/Problems: Mental Health Association (MHA) speaker came to talk about his personal journey with substance abuse and mental illness. Group members were challenged to process ways by which to relate to the speaker. [name] provided handouts and educational information pertaining to groups and services offered by the [facility]. Dale [name] attended group and stayed the entire time. He sat quietly and listened to the speaker.    Hyatt,Candace 05/29/2014, 1:27 PM
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
## 2014-05-29 NOTE — [facility] Group Notes (Signed)
[facility] LCSW Aftercare Discharge Planning Group Note   05/29/2014  9:30 AM  Participation Quality:  None; Patient choose not to participate and left group in response to CSW attempt to check in with patient and discuss discharge plan  Mood/Affect:  Blunted and Irritable    Harrill, Julious Payer

## 2014-05-29 NOTE — Progress Notes (Signed)
D: Pt reports no improvement in his symptoms. Pt continues to endorse AH and paranoia. Pt also verbalizes seeing spots. Pt denies any SI/HI. However, pt remains defensive against another pt (previous issues). Pt had increased visibility within the milieu this evening. Pt denied any concerns he wished for this writer to address at this time. Minipress not given due to low Bp. Pt was encouraged to drink fluids and return for staff to recheck for his minipress to be given. Pt declined.   A: Writer administered scheduled and prn medications to pt. Continued support and availability as needed was extended to this pt. Staff continue to monitor pt with q42min checks.  R: No adverse drug reactions noted. Pt receptive to treatment. Pt remains safe at this time.

## 2014-05-29 NOTE — Progress Notes (Signed)
D: Patient denies SI/HI and visual hallucinations; patient reports sleep fair; reports appetite is fair; reports energy level is low ; reports ability to concentrate is poor; rates depression as 10/10; rates hopelessness 10/10; rates anxiety as 10/10; patient does report auditory hallucinations; patient states " they keeping saying come on"; patient also reports that he had a dream about being stuck inside a house and that he was trapped inside  A: Monitored q 15 minutes; patient encouraged to attend groups; patient educated about medications; patient given medications per physician orders; patient encouraged to express feelings and/or concerns  R: Patient is irritable and very guarded and cautious; patient's interaction with staff and peers is minimal and isolative at times; patient has negative feelings; patient was able to set goal to talk with staff 1:1 when having feelings of SI; patient is taking medications as prescribed and tolerating medications; patient is attending some of the groups but stays in the room mostly

## 2014-05-29 NOTE — Progress Notes (Signed)
ANTICOAGULATION CONSULT NOTE - Follow Up Consult  Pharmacy Consult for coumadin Indication: h/o clots  Allergies  Allergen Reactions  . Ibuprofen Hives     Vital Signs: Temp: 97.7 F (36.5 C) (02/24 0617) Temp Source: Oral (02/24 0617) BP: 94/67 mmHg (02/24 0618) Pulse Rate: 117 (02/24 0618)  Labs:  Recent Labs  05/27/14 3976 05/28/14 0625 05/29/14 0645  LABPROT 18.3* 18.3* 23.3*  INR 1.50* 1.50* 2.05*    CrCl cannot be calculated (Unknown ideal weight.).   Medications:  Scheduled:  . risperiDONE  3 mg Oral BH-qamhs   And  . benztropine  0.5 mg Oral BH-qamhs  . gabapentin  400 mg Oral BH-[id number]  . lidocaine  1 patch Transdermal Daily  . prazosin  2 mg Oral QHS  . warfarin  7.5 mg Oral ONCE-1800  . Warfarin - Pharmacist Dosing Inpatient   Does not apply q1800    Assessment: INR is at goal.  No problems with therapy noted   Goal of Therapy:  INR 2-3    Plan:  Coumadin 7.5 mg x 1 today PT/INR in am   Charyl Dancer 05/29/2014,11:42 AM

## 2014-05-30 LAB — PROTIME-INR
INR: 2.38 — ABNORMAL HIGH (ref 0.00–1.49)
Prothrombin Time: 26.1 seconds — ABNORMAL HIGH (ref 11.6–15.2)

## 2014-05-30 MED ORDER — WARFARIN SODIUM 5 MG PO TABS
5.0000 mg | ORAL_TABLET | Freq: Once | ORAL | Status: AC
Start: 2014-05-30 — End: 2014-05-30
  Administered 2014-05-30: 5 mg via ORAL
  Filled 2014-05-30 (×2): qty 1

## 2014-05-30 NOTE — [facility] Group Notes (Signed)
The focus of this group is to educate the patient on the purpose and policies of crisis stabilization and provide a format to answer questions about their admission.  The group details unit policies and expectations of patients while admitted. Patient did not attend this group. 

## 2014-05-30 NOTE — [facility] Group Notes (Signed)
[facility] LCSW Group Therapy  05/30/2014 2:52 PM  Type of Therapy:  Group Therapy  Participation Level:  Active  Participation Quality:  Appropriate  Affect:  Labile  Cognitive:  Alert  Insight:  Improving  Engagement in Therapy:  Improving  Modes of Intervention:  Education, Socialization and Support  Summary of Progress/Problems:Feelings around Relapse. Group members discussed the meaning of relapse and shared personal stories of relapse, how it affected them and others, and how they perceived themselves during this time. Group members were encouraged to identify triggers, warning signs and coping skills used when facing the possibility of relapse. Social supports were discussed and explored in detail. Zaden identified anger as an emotion he has difficulties regulating. He stated creating positive aspects in his life helps him cope. These positive aspects include helping people, thinking positively and doing nice things for people. He was supportive of other group members.    Hyatt,Thy Gullikson 05/30/2014, 2:52 PM

## 2014-05-30 NOTE — Progress Notes (Signed)
D: Patient denies SI/HI ; patient reports that he is seeing spots and that the voices are still there but they are decreasing;  patient reports sleep that he slept on and off last night  A: Monitored q 15 minutes; patient encouraged to attend groups; patient educated about medications; patient given medications per physician orders; patient encouraged to express feelings and/or concerns  R: Patient is not as irritable and is more pleasant; patient is engaging on the unit more by participating in more groups and sharing during the groups; patient is taking all medications as prescribed and tolerating medications;

## 2014-05-30 NOTE — Progress Notes (Signed)
Psychoeducational Group Note  Date:  05/30/2014 Time:  2311  Group Topic/Focus:  Wrap-Up Group:   The focus of this group is to help patients review their daily goal of treatment and discuss progress on daily workbooks.  Participation Level: Did Not Attend  Participation Quality:  Not Applicable  Affect:  Not Applicable  Cognitive:  Not Applicable  Insight:  Not Applicable  Engagement in Group: Not Applicable  Additional Comments:  The patient did not attend group since he remained asleep in his bedroom.   Lorana Maffeo S 05/30/2014, 11:10 PM
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
## 2014-05-30 NOTE — Progress Notes (Signed)
Recreation Therapy Notes  Animal-Assisted Activity/Therapy (AAA/T) Program Checklist/Progress Notes Patient Eligibility Criteria Checklist & Daily Group note for Rec Tx Intervention  Date:  02.25.2016 Time: 2:45pm Location: [address] [employer]    AAA/T Program Assumption of Risk Form signed by Patient/ or Parent Legal Guardian yes  Patient is free of allergies or sever asthma yes  Patient reports no fear of animals yes  Patient reports no history of cruelty to animals yes  Patient understands his/her participation is voluntary yes  Behavioral Response: Did not attend.   [name] Leanore Biggers, LRT/CTRS  Jearl Klinefelter 05/30/2014 4:38 PM
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
## 2014-05-31 MED ORDER — HYDROCORTISONE 0.5 % EX CREA
TOPICAL_CREAM | CUTANEOUS | Status: AC
  Filled 2014-05-31: qty 28.35

## 2014-05-31 MED ORDER — WARFARIN SODIUM 5 MG PO TABS
5.0000 mg | ORAL_TABLET | Freq: Once | ORAL | Status: AC
  Administered 2014-05-31: 5 mg via ORAL
  Filled 2014-05-31: qty 1

## 2014-05-31 MED ORDER — HYDROCORTISONE 0.5 % EX CREA
TOPICAL_CREAM | Freq: Four times a day (QID) | CUTANEOUS | Status: DC | PRN
  Administered 2014-05-31: 19:00:00 via TOPICAL

## 2014-05-31 MED ORDER — TRAZODONE HCL 50 MG PO TABS
125.0000 mg | ORAL_TABLET | Freq: Every day | ORAL | Status: DC
  Administered 2014-05-31 – 2014-06-01 (×2): 125 mg via ORAL
  Filled 2014-05-31 (×4): qty 2.5

## 2014-05-31 MED ORDER — DOCUSATE SODIUM 100 MG PO CAPS
100.0000 mg | ORAL_CAPSULE | Freq: Two times a day (BID) | ORAL | Status: DC
  Administered 2014-06-01 – 2014-06-03 (×4): 100 mg via ORAL
  Filled 2014-05-31 (×8): qty 1

## 2014-05-31 MED ORDER — RISPERIDONE 0.5 MG PO TBDP
0.5000 mg | ORAL_TABLET | Freq: Every day | ORAL | Status: DC
  Administered 2014-05-31 – 2014-06-02 (×3): 0.5 mg via ORAL
  Filled 2014-05-31 (×4): qty 1
  Filled 2014-05-31: qty 14

## 2014-05-31 MED ORDER — BENZTROPINE MESYLATE 0.5 MG PO TABS
0.5000 mg | ORAL_TABLET | Freq: Every day | ORAL | Status: DC
  Filled 2014-05-31: qty 1

## 2014-05-31 MED ORDER — SALINE SPRAY 0.65 % NA SOLN
1.0000 | NASAL | Status: DC | PRN
  Administered 2014-05-31: 1 via NASAL
  Filled 2014-05-31: qty 44
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
## 2014-05-31 NOTE — Progress Notes (Signed)
D)  Sleeping since the shift started.  Eyes closed, snoring softly, no c/o's voiced.  Missed group.  Came out after group was over for hs meds and requested pain med.  Affect is flat, depressed mood, little eye contact, and becomes more irritable when this writer tried to engage him in more conversation.   A)  Will continue to monitor for safety, continue POC R)  Safety maintained.

## 2014-05-31 NOTE — Progress Notes (Signed)
Patient ID: Dale [name], male   DOB: [birth year], 49 y.o.   MRN: [id number] D: Patient pleasant on approach today. Reports mood better at present but still reports auditory hallucinations that mumble and say negative things. Gives depression "7" and hopelessness and anxiety "8". Contracts for safety on the unit.  A: Staff will monitor on q 15 minute checks, follow treatment plan, and give meds as ordered. R: Taking meds without difficulty. Using walker when needed for foot.

## 2014-05-31 NOTE — BHH Group Notes (Signed)
Adult Psychoeducational Group Note  Date:  05/31/2014 Time:  1030  Group Topic/Focus:  Building Self Esteem:   The Focus of this group is helping patients become aware of the effects of self-esteem on their lives, the things they and others do that enhance or undermine their self-esteem, seeing the relationship between their level of self-esteem and the choices they make and learning ways to enhance self-esteem.  Participation Level:  Appropriate  Participation Quality:  Appropriate  Affect:  Appropriate  Cognitive:  Appropriate  Insight: Appropriate  Engagement in Group:  Developing/Improving  Modes of Intervention:  Discussion  Additional Comments:  Attentive  Earline Mayotte 05/31/2014, 12:31 PM

## 2014-05-31 NOTE — B[facility] Group Notes (Signed)
[facility] LCSW Group Therapy  05/31/2014 1:15 PM  Type of Therapy:  Group Therapy  Participation Level:  Active  Participation Quality:  Appropriate, Attentive and Sharing  Affect:  Appropriate  Cognitive:  Alert and Oriented until last 15 minutes when he nodded off  Insight:  Developing/Improving, Engaged and Improving  Engagement in Therapy:  Engaged  Modes of Intervention:  Activity, Clarification, Exploration, Rapport Building, Socialization and Support  Summary of Progress/Problems:Today's topic focused on theme of community.  Powell shared multiple times about how being connected to others promotes a sense of community and gives one a positive outlook verses isolating. Patient also shared about importance of taking medications regularly. He chose a photo of a railroad tract to represent community.    Carney Bern, LCSW 05/31/2014, 1:15 PM
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
## 2014-05-31 NOTE — Progress Notes (Signed)
D)  Has been isolative and quiet this evening, slept through [employer].  Came out to get a snack and hs meds, was staring, seemed internally focused, endorses a/v h.  Has little to say, but is compliant with meds.  Was medicated for pain in rt foot, hoping the minipress will help his nightmares.  Went back to his room, back to bed after a snack.  Stated his pain was a little better but seems to always be there in his foot, never goes away completely. A)  Will continue to monitor for safety, continue POC R)  Safety maintained.

## 2014-05-31 NOTE — Progress Notes (Signed)
ANTICOAGULATION CONSULT NOTE - Follow Up Consult  Pharmacy Consult for Coumadin Indication: h/o clot  Allergies  Allergen Reactions  . Ibuprofen Hives     Vital Signs: Temp: 98 F (36.7 C) (02/26 0700) Temp Source: Oral (02/26 0700) BP: 96/67 mmHg (02/26 0701) Pulse Rate: 73 (02/26 0701)  Labs:  Recent Labs  05/29/14 0645 05/30/14 0647  LABPROT 23.3* 26.1*  INR 2.05* 2.38*    CrCl cannot be calculated (Unknown ideal weight.).   Medications:  Scheduled:  . risperiDONE  4 mg Oral QHS   And  . benztropine  0.5 mg Oral BH-qamhs  . gabapentin  600 mg Oral BH-q8a3phs  . lidocaine  1 patch Transdermal Daily  . prazosin  2 mg Oral QHS  . risperiDONE  0.5 mg Oral Q1400  . risperiDONE  3 mg Oral Q breakfast  . traZODone  125 mg Oral QHS  . warfarin  5 mg Oral ONCE-1800  . Warfarin - Pharmacist Dosing Inpatient   Does not apply q1800    Assessment: INR at goal yesterday, no lab drawn this am, no problems noted with therapy  Goal of Therapy:  INR 2-3    Plan:  Coumadin 5 mg po x 1 today PT/INR in am   Charyl Dancer 05/31/2014,1:30 PM

## 2014-06-01 DIAGNOSIS — F1590 Other stimulant use, unspecified, uncomplicated: Secondary | ICD-10-CM

## 2014-06-01 LAB — PROTIME-INR
INR: 2.31 — ABNORMAL HIGH (ref 0.00–1.49)
Prothrombin Time: 25.6 seconds — ABNORMAL HIGH (ref 11.6–15.2)

## 2014-06-01 MED ORDER — WARFARIN SODIUM 5 MG PO TABS
5.0000 mg | ORAL_TABLET | Freq: Once | ORAL | Status: AC
  Administered 2014-06-01: 5 mg via ORAL
  Filled 2014-06-01 (×2): qty 1

## 2014-06-01 NOTE — [facility] Group Notes (Signed)
[facility] Group Notes: (Clinical Social Work)   06/01/2014      Type of Therapy:  Group Therapy   Participation Level:  Did Not Attend despite MHT prompting   Thien Berka Grossman-Orr, LCSW 06/01/2014, 12:20 PM     

## 2014-06-01 NOTE — [facility] Group Notes (Signed)
[facility] Group Notes:  Coping skills  Date:  06/01/2014  Time:  3:01 PM  Type of Therapy:  Nurse Education  Participation Level:  Did Not Attend  Participation Quality:  Appropriate  Affect:  Appropriate  Cognitive:  Appropriate  Insight:  Appropriate  Engagement in Group:  Engaged  Modes of Intervention:  Discussion  Summary of Progress/Problems:  Nicole Cella 06/01/2014, 3:01 PM

## 2014-06-01 NOTE — Progress Notes (Signed)
Adult Psychoeducational Group Note  Date:  06/01/2014 Time:  4:27 PM  Group Topic/Focus:  Psycho Educational   Participation Level:  Active  Participation Quality:  Appropriate and Sharing  Affect:  Appropriate  Cognitive:  Alert  Insight: Good  Engagement in Group:  Engaged and Supportive  Modes of Intervention:  Activity and Discussion  Additional Comments:   Pt. Reported accepting the life he was given and wanting to make it better for himself and his daughter.    Elmore Guise N 06/01/2014, 4:27 PM

## 2014-06-01 NOTE — Progress Notes (Signed)
Pt alert and cooperative. Affect/mood anxious and depressed. Passive SI, "I have no control, my emotions come and go, I feel suicidal". Verbally contracts for safety. -HI, +A/ hall "A hear a bunch of noise, rambling, screaming and hollering".  -V/hall. C/o right foot pain and constipation, see MAR. Visible in dayroom, interactive with peers. Emotional support and encouragement given. Will continue to monitor closely and evaluate for stabilization.
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
## 2014-06-01 NOTE — Progress Notes (Signed)
Psychoeducational Group Note  Date:  06/01/2014 Time:  0039  Group Topic/Focus:  Wrap-Up Group:   The focus of this group is to help patients review their daily goal of treatment and discuss progress on daily workbooks.  Participation Level: Did Not Attend  Participation Quality:  Not Applicable  Affect:  Not Applicable  Cognitive:  Not Applicable  Insight:  Not Applicable  Engagement in Group: Not Applicable  Additional Comments:  The patient did not attend last evening's A.A. Meeting and remained in his bed until later in the evening.   Hazle Coca S 06/01/2014, 12:39 AM

## 2014-06-02 DIAGNOSIS — F1990 Other psychoactive substance use, unspecified, uncomplicated: Secondary | ICD-10-CM

## 2014-06-02 LAB — PROTIME-INR
INR: 2.16 — ABNORMAL HIGH (ref 0.00–1.49)
PROTHROMBIN TIME: 24.2 s — AB (ref 11.6–15.2)

## 2014-06-02 MED ORDER — WARFARIN SODIUM 7.5 MG PO TABS
7.5000 mg | ORAL_TABLET | Freq: Once | ORAL | Status: AC
Start: 2014-06-02 — End: 2014-06-02
  Administered 2014-06-02: 7.5 mg via ORAL
  Filled 2014-06-02: qty 1

## 2014-06-02 MED ORDER — TRAZODONE HCL 150 MG PO TABS
150.0000 mg | ORAL_TABLET | Freq: Every day | ORAL | Status: DC
  Administered 2014-06-02: 150 mg via ORAL
  Filled 2014-06-02 (×2): qty 1

## 2014-06-02 NOTE — Progress Notes (Signed)
Patient ID: Dale [name], male   DOB: 1965/11/18, 49 y.o.   MRN: [id number]   D: Pt continues to be very flat, depressed, irritable, and agitated on the unit. Pt was in the bed most of the morning, he did not go to group nor did he engage in any treatment. Pt did attend afternoon group where all patients went out side, pt was happy with being able to go outside. Pt refused to fill out his self inventory sheet, but did report that he remains very depressed. Pt reported being negative SI/HI, no AH/VH noted. A: 15 min checks continued for patient safety. R: Pt safety maintained.

## 2014-06-02 NOTE — [facility] Group Notes (Signed)
[facility] Group Notes: (Clinical Social Work)   06/02/2014      Type of Therapy:  Group Therapy   Participation Level:  Did Not Attend despite MHT prompting   Ambrose Mantle, LCSW 06/02/2014, 12:06 PM

## 2014-06-02 NOTE — [facility] Group Notes (Signed)
[facility] Group Notes:  (Nursing/MHT/Case Management/Adjunct)  Date:  06/02/2014  Time:  3:51 PM  Type of Therapy:  Psychoeducational Skills  Participation Level:  Did Not Attend  Participation Quality:  Did Not Attend  Affect:  Did Not Attend  Cognitive:  Did Not Attend  Insight:  None  Engagement in Group:  Did Not Attend  Modes of Intervention:  Did Not Attend  Summary of Progress/Problems: Pt did attend healthy support systems group.   Dale [name] 06/02/2014, 3:51 PM

## 2014-06-02 NOTE — Progress Notes (Signed)
Adult Psychoeducational Group Note  Date:  06/02/2014 Time:  2015  Group Topic/Focus:  AA Meeting  Participation Level:  Did Not Attend  Dale [name] 06/02/2014, 10:37 PM
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
## 2014-06-02 NOTE — [facility] Group Notes (Signed)
[facility] Group Notes:  (Nursing/MHT/Case Management/Adjunct)  Date:  06/02/2014  Time:  3:49 PM  Type of Therapy:  Psychoeducational Skills  Participation Level:  Did Not Attend  Participation Quality:  Did Not Attend  Affect:  Did Not Attend  Cognitive:  Did Not Attend  Insight:  None  Engagement in Group:  Did Not Attend  Modes of Intervention:  Did Not Attend  Summary of Progress/Problems: Pt did attend patient self inventory group.   Jacquelyne Balint Shanta 06/02/2014, 3:49 PM
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
## 2014-06-03 LAB — PROTIME-INR
INR: 2.17 — ABNORMAL HIGH (ref 0.00–1.49)
Prothrombin Time: 24.4 seconds — ABNORMAL HIGH (ref 11.6–15.2)

## 2014-06-03 MED ORDER — SIMVASTATIN 20 MG PO TABS: 20.0000 mg | ORAL_TABLET | Freq: Every day | ORAL | Status: DC

## 2014-06-03 MED ORDER — RISPERIDONE 1 MG PO TBDP
3.0000 mg | ORAL_TABLET | Freq: Every day | ORAL | Status: DC
  Filled 2014-06-03: qty 3

## 2014-06-03 MED ORDER — PRAZOSIN HCL 2 MG PO CAPS: 2.0000 mg | ORAL_CAPSULE | Freq: Every day | ORAL | Status: DC

## 2014-06-03 MED ORDER — WARFARIN SODIUM 7.5 MG PO TABS
7.5000 mg | ORAL_TABLET | Freq: Every day | ORAL | Status: DC
  Filled 2014-06-03: qty 2

## 2014-06-03 MED ORDER — DOCUSATE SODIUM 100 MG PO CAPS: 100.0000 mg | ORAL_CAPSULE | Freq: Two times a day (BID) | ORAL | Status: DC

## 2014-06-03 MED ORDER — RISPERIDONE 3 MG PO TBDP: 3.0000 mg | ORAL_TABLET | Freq: Every day | ORAL | Status: DC

## 2014-06-03 MED ORDER — RISPERIDONE 2 MG PO TABS
3.0000 mg | ORAL_TABLET | Freq: Every day | ORAL | Status: DC
  Filled 2014-06-03: qty 49

## 2014-06-03 MED ORDER — WARFARIN SODIUM 7.5 MG PO TABS: 7.5000 mg | ORAL_TABLET | Freq: Every day | ORAL | Status: DC

## 2014-06-03 MED ORDER — RISPERIDONE 4 MG PO TBDP: 4.0000 mg | ORAL_TABLET | Freq: Every day | ORAL | Status: DC

## 2014-06-03 MED ORDER — GABAPENTIN 600 MG PO TABS
600.0000 mg | ORAL_TABLET | Freq: Three times a day (TID) | ORAL | Status: DC
  Filled 2014-06-03: qty 42

## 2014-06-03 MED ORDER — LIDOCAINE 5 % EX PTCH: 1.0000 | MEDICATED_PATCH | Freq: Every day | CUTANEOUS | Status: DC

## 2014-06-03 MED ORDER — WARFARIN SODIUM 7.5 MG PO TABS
7.5000 mg | ORAL_TABLET | Freq: Every day | ORAL | Status: DC
  Filled 2014-06-03: qty 1

## 2014-06-03 MED ORDER — RISPERIDONE 0.5 MG PO TBDP: 0.5000 mg | ORAL_TABLET | Freq: Every day | ORAL | Status: DC

## 2014-06-03 MED ORDER — PANTOPRAZOLE SODIUM 20 MG PO TBEC: 20.0000 mg | DELAYED_RELEASE_TABLET | Freq: Two times a day (BID) | ORAL | Status: DC

## 2014-06-03 MED ORDER — BENZTROPINE MESYLATE 0.5 MG PO TABS: 0.5000 mg | ORAL_TABLET | ORAL | Status: DC

## 2014-06-03 MED ORDER — TRAZODONE HCL 150 MG PO TABS: 150.0000 mg | ORAL_TABLET | Freq: Every day | ORAL | Status: DC

## 2014-06-03 MED ORDER — GABAPENTIN 300 MG PO CAPS: 600.0000 mg | ORAL_CAPSULE | ORAL | Status: DC
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
## 2014-06-03 NOTE — Progress Notes (Signed)
Patient ID: Dale [name], male   DOB: 1965/08/13, 49 y.o.   MRN: [id number] Patient needs to be at [facility] by 1500 to inquire about a bed.

## 2014-06-03 NOTE — Progress Notes (Signed)
Patient ID: Dale [name], male   DOB: Mar 11, 1966, 49 y.o.   MRN: [id number] Nursing discharge note: Patient discharged per MD order.  Patient is to go to [facility] between 3:00 and 4:00 pm to inquire about a bed.  Patient was given a couple of bus tickets for transportation.  He denies SI/HI/AVH.  Patient received all his personal belongings, prescriptions and medication samples.  Follow up instructions reviewed with patient and he indicated understanding of same.  Patient left ambulatory for the bus station without incident.

## 2014-06-03 NOTE — Progress Notes (Signed)
  [facility] Adult Case Management Discharge Plan :  Will you be returning to the same living situation after discharge:  No. Pt plans to go to [facility] at d/c.  At discharge, do you have transportation home?: Yes,  bus pass in chart.  Do you have the ability to pay for your medications: Yes,  mental health  Release of information consent forms completed and submitted to Medical Records by CSW.  Patient to Follow up at: Follow-up Information    Follow up with [facility] On 06/10/2014.   Why:  Monday, March 7th at 4:00 with Thurston Hole for medication management. Wednesday, March 9th at 3:00pm with Apolinar Junes for individual therapy.    Contact information:   [address], [place] [address] Phone: [phone] Fax: [phone]      Follow up with [facility]    . Call today.   Why:  For follow up of medical problems including monitoring for coumadin   Contact information:   [address] [facility] [address] [phone]      Patient denies SI/HI: Yes,  during group/self report.     Safety Planning and Suicide Prevention discussed: Yes,  Pt refused to allow family contact. SPE completed with pt. SPI pamphlet provided to pt and he was encouraged to share information with support network, ask questions, and talk about any concerns relating to SPE. Pt reports no SI and reports no access to firearms.   Have you used any form of tobacco in the last 30 days? (Cigarettes, Smokeless Tobacco, Cigars, and/or Pipes): Yes  Has patient been referred to the Quitline?: Patient refused referral  Smart, Lebron Quam  06/03/2014, 10:59 AM

## 2014-06-03 NOTE — Progress Notes (Signed)
Patient ID: Dale [name], male   DOB: [birth year], 49 y.o.   MRN: [id number] D: Patient denies SI/HI/AVH.  His main complaint is continuing pain in his right foot.  He did refused lidocaine patch.  He rates his depression as a 7; hopelessness as an 8; anxiety as an 8.  He is attending group and participating in his treatment.  Patient is scheduled for discharge today.   A: Continue to monitor medication management and MD orders.  Safety checks completed every 15 minutes per protocol.  Meet 1:1 with patient to discuss concerns and offer encouragement. R: Patient's behavior is appropriate to situation.
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
## 2014-06-03 NOTE — [facility] Group Notes (Signed)
[facility] LCSW Aftercare Discharge Planning Group Note   06/03/2014 10:56 AM  Participation Quality:  Appropriate   Mood/Affect:  Appropriate  Depression Rating:  7  Anxiety Rating:  8  Thoughts of Suicide:  No Will you contract for safety?   NA  Current AVH:  No-I hear voices every now and then. Not right now though.   Plan for Discharge/Comments:  Pt plans to d/c today to [facility]. [facility] contacted [facility] this morning and was told by intake to have pt arrive by 3 or 4 pm for assessment. They could not guarantee bed but stated that it is likely they will have bed for him. They took his name and are expecting him this afternoon. Pt to follow up at [facility] for mental health services. Pt also given list of [facility], [facility] info, and Mental health association information.   Transportation Means: bus pass in chart.   Supports: none identified by pt.   [employer]

## 2014-06-03 NOTE — Progress Notes (Signed)
Adult Psychoeducational Group Note  Date:  06/03/2014 Time:  1:03 AM  Group Topic/Focus:  Wrap-Up Group:   The focus of this group is to help patients review their daily goal of treatment and discuss progress on daily workbooks.  Participation Level:  Active  Participation Quality:  Appropriate  Affect:  Appropriate  Cognitive:  Appropriate  Insight: Appropriate  Engagement in Group:  Engaged  Modes of Intervention:  Discussion and Education  Additional Comments:  Pt stated that he had a good day and being able to go outside helped him feel better. Pt shared that he has appreciated the company here in the hospital and has found insight by participating. Pt shared that his family is support system. Pt also shared that he had feelings of hopelessness and wanted to give up, but he has felt inspired by staff and other patients to keep going.  Malachy Moan 06/03/2014, 1:03 AM

## 2014-06-03 NOTE — Progress Notes (Signed)
ANTICOAGULATION CONSULT NOTE - Follow Up Consult  Pharmacy Consult for Coumadin Indication: h/o clot  Allergies  Allergen Reactions  . Ibuprofen Hives    Patient Measurements:   Vital Signs: Temp: 97.7 F (36.5 C) (02/29 0621) BP: 94/63 mmHg (02/29 0622) Pulse Rate: 116 (02/29 0622)  Labs:  Recent Labs  06/01/14 0630 06/02/14 0646 06/03/14 0645  LABPROT 25.6* 24.2* 24.4*  INR 2.31* 2.16* 2.17*    CrCl cannot be calculated (Unknown ideal weight.).   Medications:  Scheduled:  . risperiDONE  4 mg Oral QHS   And  . benztropine  0.5 mg Oral BH-qamhs  . docusate sodium  100 mg Oral BID  . gabapentin  600 mg Oral [id number]  . lidocaine  1 patch Transdermal Daily  . prazosin  2 mg Oral QHS  . risperiDONE  0.5 mg Oral Q1400  . [START ON 06/04/2014] risperiDONE  3 mg Oral Q breakfast  . traZODone  150 mg Oral QHS  . warfarin  7.5 mg Oral q1800  . Warfarin - Pharmacist Dosing Inpatient   Does not apply q1800    Assessment: INR at Goal. No problems with therapy noted  Goal of Therapy:  INR 2-3    Plan:  Coumadin 7.5 mg x 2 days PT/INR wed am labs  Charyl Dancer 06/03/2014,12:58 PM

## 2014-06-03 NOTE — Progress Notes (Signed)
D)  Had been in the dayroom this evening, went to his room when dayroom was closed for group time, didn't want to go to AA group on the hall.  Was invited to go to depression group on [address] hall, and he attended and was appreciative, stated that was his issue and felt it had been a good group session.  Has been compliant with meds and programming this evening, seems less irritable, but conversation minimal.  Stated would be leaving this week, asked appropriate questions about meds, denies thoughts of self harm, still seeing and hearing things at times. A)  Will continue to monitor for safety, continue POC, support. R)  Safety maintained.

## 2014-06-06 NOTE — Progress Notes (Signed)
Patient Discharge Instructions:  After Visit Summary (AVS):   Faxed to:  06/06/14 Discharge Summary Note:   Faxed to:  06/06/14 Psychiatric Admission Assessment Note:   Faxed to:  06/06/14 Suicide Risk Assessment - Discharge Assessment:   Faxed to:  06/06/14 Faxed/Sent to the Next Level Care provider:  06/06/14 Faxed to [facility] @ [phone] No documentation was faxed to [facility].  No ROI is available.  Jerelene Redden, 06/06/2014, 3:28 PM

## 2014-06-07 ENCOUNTER — Encounter (HOSPITAL_COMMUNITY): Payer: Self-pay | Admitting: Emergency Medicine

## 2014-06-07 ENCOUNTER — Emergency Department (HOSPITAL_COMMUNITY): Payer: Self-pay

## 2014-06-07 ENCOUNTER — Emergency Department (HOSPITAL_COMMUNITY)
Admission: EM | Admit: 2014-06-07 | Discharge: 2014-06-08 | Disposition: A | Discharge status: Home / Self Care | Payer: Self-pay | Attending: Emergency Medicine | Admitting: Emergency Medicine

## 2014-06-07 ENCOUNTER — Other Ambulatory Visit: Payer: Self-pay

## 2014-06-07 DIAGNOSIS — Z7901 Long term (current) use of anticoagulants: Secondary | ICD-10-CM | POA: Insufficient documentation

## 2014-06-07 DIAGNOSIS — R0789 Other chest pain: Secondary | ICD-10-CM | POA: Insufficient documentation

## 2014-06-07 DIAGNOSIS — R079 Chest pain, unspecified: Secondary | ICD-10-CM

## 2014-06-07 DIAGNOSIS — Z872 Personal history of diseases of the skin and subcutaneous tissue: Secondary | ICD-10-CM | POA: Insufficient documentation

## 2014-06-07 DIAGNOSIS — R45851 Suicidal ideations: Secondary | ICD-10-CM | POA: Insufficient documentation

## 2014-06-07 DIAGNOSIS — Z79899 Other long term (current) drug therapy: Secondary | ICD-10-CM | POA: Insufficient documentation

## 2014-06-07 DIAGNOSIS — Z86718 Personal history of other venous thrombosis and embolism: Secondary | ICD-10-CM | POA: Insufficient documentation

## 2014-06-07 DIAGNOSIS — Z720 Tobacco use: Secondary | ICD-10-CM | POA: Insufficient documentation

## 2014-06-07 DIAGNOSIS — I10 Essential (primary) hypertension: Secondary | ICD-10-CM | POA: Insufficient documentation

## 2014-06-07 DIAGNOSIS — Z87448 Personal history of other diseases of urinary system: Secondary | ICD-10-CM | POA: Insufficient documentation

## 2014-06-07 DIAGNOSIS — Z87820 Personal history of traumatic brain injury: Secondary | ICD-10-CM | POA: Insufficient documentation

## 2014-06-07 LAB — CBC WITH DIFFERENTIAL/PLATELET
BASOS ABS: 0 10*3/uL (ref 0.0–0.1)
BASOS PCT: 0 % (ref 0–1)
EOS ABS: 0.4 10*3/uL (ref 0.0–0.7)
EOS PCT: 5 % (ref 0–5)
HCT: 45.3 % (ref 39.0–52.0)
Hemoglobin: 15.7 g/dL (ref 13.0–17.0)
Lymphocytes Relative: 25 % (ref 12–46)
Lymphs Abs: 2 10*3/uL (ref 0.7–4.0)
MCH: 30.8 pg (ref 26.0–34.0)
MCHC: 34.7 g/dL (ref 30.0–36.0)
MCV: 89 fL (ref 78.0–100.0)
MONO ABS: 1.1 10*3/uL — AB (ref 0.1–1.0)
Monocytes Relative: 13 % — ABNORMAL HIGH (ref 3–12)
Neutro Abs: 4.8 10*3/uL (ref 1.7–7.7)
Neutrophils Relative %: 57 % (ref 43–77)
PLATELETS: 231 10*3/uL (ref 150–400)
RBC: 5.09 MIL/uL (ref 4.22–5.81)
RDW: 13.7 % (ref 11.5–15.5)
WBC: 8.2 10*3/uL (ref 4.0–10.5)

## 2014-06-07 LAB — COMPREHENSIVE METABOLIC PANEL
ALBUMIN: 3.5 g/dL (ref 3.5–5.2)
ALT: 26 U/L (ref 0–53)
AST: 27 U/L (ref 0–37)
Alkaline Phosphatase: 58 U/L (ref 39–117)
Anion gap: 7 (ref 5–15)
BILIRUBIN TOTAL: 0.6 mg/dL (ref 0.3–1.2)
BUN: 20 mg/dL (ref 6–23)
CO2: 25 mmol/L (ref 19–32)
Calcium: 8.8 mg/dL (ref 8.4–10.5)
Chloride: 108 mmol/L (ref 96–112)
Creatinine, Ser: 1.53 mg/dL — ABNORMAL HIGH (ref 0.50–1.35)
GFR calc Af Amer: 61 mL/min — ABNORMAL LOW (ref >90)
GFR, EST NON AFRICAN AMERICAN: 53 mL/min — AB (ref >90)
Glucose, Bld: 82 mg/dL (ref 70–99)
POTASSIUM: 4 mmol/L (ref 3.5–5.1)
Sodium: 140 mmol/L (ref 135–145)
Total Protein: 5.6 g/dL — ABNORMAL LOW (ref 6.0–8.3)

## 2014-06-07 LAB — PROTIME-INR
INR: 1.09 (ref 0.00–1.49)
Prothrombin Time: 14.2 seconds (ref 11.6–15.2)

## 2014-06-07 LAB — ETHANOL: Alcohol, Ethyl (B): <5 mg/dL (ref 0–9)

## 2014-06-07 LAB — ACETAMINOPHEN LEVEL: Acetaminophen (Tylenol), Serum: <10 ug/mL — ABNORMAL LOW (ref 10–30)

## 2014-06-07 LAB — TROPONIN I

## 2014-06-07 LAB — SALICYLATE LEVEL

## 2014-06-07 MED ORDER — PANTOPRAZOLE SODIUM 20 MG PO TBEC
20.0000 mg | DELAYED_RELEASE_TABLET | Freq: Two times a day (BID) | ORAL | Status: DC
Start: 2014-06-08 — End: 2014-06-08
  Filled 2014-06-07 (×3): qty 1

## 2014-06-07 MED ORDER — SODIUM CHLORIDE 0.9 % IV BOLUS (SEPSIS)
1000.0000 mL | INTRAVENOUS | Status: AC
  Administered 2014-06-07: 1000 mL via INTRAVENOUS

## 2014-06-07 MED ORDER — RISPERIDONE 2 MG PO TBDP
3.0000 mg | ORAL_TABLET | Freq: Every day | ORAL | Status: DC
  Filled 2014-06-07 (×2): qty 1

## 2014-06-07 MED ORDER — RISPERIDONE 0.5 MG PO TBDP
0.5000 mg | ORAL_TABLET | Freq: Every day | ORAL | Status: DC
  Administered 2014-06-08: 0.5 mg via ORAL
  Filled 2014-06-07 (×2): qty 1

## 2014-06-07 MED ORDER — HYDROMORPHONE HCL 1 MG/ML IJ SOLN
1.0000 mg | Freq: Once | INTRAMUSCULAR | Status: AC
  Administered 2014-06-07: 1 mg via INTRAVENOUS
  Filled 2014-06-07: qty 1

## 2014-06-07 MED ORDER — SIMVASTATIN 20 MG PO TABS
20.0000 mg | ORAL_TABLET | Freq: Every day | ORAL | Status: DC
Start: 2014-06-08 — End: 2014-06-08
  Filled 2014-06-07: qty 1

## 2014-06-07 MED ORDER — LORAZEPAM 1 MG PO TABS
0.5000 mg | ORAL_TABLET | Freq: Once | ORAL | Status: AC
  Administered 2014-06-07: 0.5 mg via ORAL
  Filled 2014-06-07: qty 1

## 2014-06-07 MED ORDER — TRAZODONE HCL 50 MG PO TABS
150.0000 mg | ORAL_TABLET | Freq: Every day | ORAL | Status: DC
  Administered 2014-06-07: 150 mg via ORAL
  Filled 2014-06-07 (×2): qty 1

## 2014-06-07 MED ORDER — RISPERIDONE 2 MG PO TBDP
4.0000 mg | ORAL_TABLET | Freq: Every day | ORAL | Status: DC
Start: 2014-06-07 — End: 2014-06-08
  Administered 2014-06-07: 4 mg via ORAL
  Filled 2014-06-07 (×2): qty 2

## 2014-06-07 MED ORDER — PRAZOSIN HCL 2 MG PO CAPS
2.0000 mg | ORAL_CAPSULE | Freq: Every day | ORAL | Status: DC
  Administered 2014-06-07: 2 mg via ORAL
  Filled 2014-06-07 (×2): qty 1

## 2014-06-07 MED ORDER — BENZTROPINE MESYLATE 1 MG PO TABS: 0.5000 mg | ORAL_TABLET | ORAL | Status: DC

## 2014-06-07 MED ORDER — GABAPENTIN 300 MG PO CAPS
600.0000 mg | ORAL_CAPSULE | ORAL | Status: DC
  Administered 2014-06-08: 600 mg via ORAL
  Filled 2014-06-07: qty 2

## 2014-06-07 MED ORDER — WARFARIN - PHYSICIAN DOSING INPATIENT: Freq: Every day | Status: DC

## 2014-06-07 MED ORDER — DOCUSATE SODIUM 100 MG PO CAPS
100.0000 mg | ORAL_CAPSULE | Freq: Two times a day (BID) | ORAL | Status: DC
  Administered 2014-06-07: 100 mg via ORAL
  Filled 2014-06-07: qty 1

## 2014-06-07 MED ORDER — ACETAMINOPHEN 325 MG PO TABS: 650.0000 mg | ORAL_TABLET | ORAL | Status: DC | PRN

## 2014-06-07 MED ORDER — WARFARIN SODIUM 7.5 MG PO TABS
7.5000 mg | ORAL_TABLET | Freq: Every day | ORAL | Status: DC
  Administered 2014-06-07: 7.5 mg via ORAL
  Filled 2014-06-07 (×2): qty 1

## 2014-06-07 NOTE — ED Notes (Signed)
Pt changed into paper scrubs- security paged to wand pt.

## 2014-06-07 NOTE — ED Notes (Signed)
Pt presents with suicidal thoughts states "I'm just ready to die", admits to occasional homicidal thoughts "because people have done me so wrong".  Pt reports he tired to overdose on pills on Tuesday, admits to drinking alcohol "been drinking since this morning" and cocaine use today.  Pt admits to seeing dragons and specs and hearing sirens and yelling.  Pt calm and cooperative at present.

## 2014-06-07 NOTE — ED Notes (Signed)
Consulting civil engineer and staffing notified of need for sitter.
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
## 2014-06-08 ENCOUNTER — Encounter (HOSPITAL_COMMUNITY): Payer: Self-pay | Admitting: *Deleted

## 2014-06-08 ENCOUNTER — Inpatient Hospital Stay (HOSPITAL_COMMUNITY)
Admission: AD | Admit: 2014-06-08 | Discharge: 2014-06-14 | DRG: 885 | Disposition: A | Discharge status: Home / Self Care | Payer: Federal, State, Local not specified - Other | Source: Intra-hospital | Attending: Psychiatry | Admitting: Psychiatry

## 2014-06-08 DIAGNOSIS — R45851 Suicidal ideations: Secondary | ICD-10-CM | POA: Diagnosis present

## 2014-06-08 DIAGNOSIS — E785 Hyperlipidemia, unspecified: Secondary | ICD-10-CM | POA: Diagnosis present

## 2014-06-08 DIAGNOSIS — F333 Major depressive disorder, recurrent, severe with psychotic symptoms: Secondary | ICD-10-CM | POA: Diagnosis not present

## 2014-06-08 DIAGNOSIS — F1994 Other psychoactive substance use, unspecified with psychoactive substance-induced mood disorder: Secondary | ICD-10-CM | POA: Diagnosis present

## 2014-06-08 DIAGNOSIS — F1424 Cocaine dependence with cocaine-induced mood disorder: Secondary | ICD-10-CM | POA: Diagnosis present

## 2014-06-08 DIAGNOSIS — F323 Major depressive disorder, single episode, severe with psychotic features: Secondary | ICD-10-CM | POA: Diagnosis present

## 2014-06-08 DIAGNOSIS — F17210 Nicotine dependence, cigarettes, uncomplicated: Secondary | ICD-10-CM | POA: Diagnosis present

## 2014-06-08 DIAGNOSIS — K219 Gastro-esophageal reflux disease without esophagitis: Secondary | ICD-10-CM | POA: Diagnosis present

## 2014-06-08 LAB — URINE MICROSCOPIC-ADD ON

## 2014-06-08 LAB — URINALYSIS, ROUTINE W REFLEX MICROSCOPIC
Glucose, UA: NEGATIVE mg/dL
Ketones, ur: 15 mg/dL — AB
NITRITE: NEGATIVE
PROTEIN: NEGATIVE mg/dL
Specific Gravity, Urine: 1.03 (ref 1.005–1.030)
Urobilinogen, UA: 1 mg/dL (ref 0.0–1.0)
pH: 5.5 (ref 5.0–8.0)

## 2014-06-08 LAB — RAPID URINE DRUG SCREEN, HOSP PERFORMED
Amphetamines: NOT DETECTED
Barbiturates: NOT DETECTED
Benzodiazepines: NOT DETECTED
COCAINE: POSITIVE — AB
Opiates: POSITIVE — AB
TETRAHYDROCANNABINOL: NOT DETECTED

## 2014-06-08 MED ORDER — ACETAMINOPHEN 325 MG PO TABS: 650.0000 mg | ORAL_TABLET | Freq: Four times a day (QID) | ORAL | Status: DC | PRN

## 2014-06-08 MED ORDER — DOCUSATE SODIUM 100 MG PO CAPS
100.0000 mg | ORAL_CAPSULE | Freq: Two times a day (BID) | ORAL | Status: DC
  Administered 2014-06-08 – 2014-06-14 (×13): 100 mg via ORAL
  Filled 2014-06-08 (×7): qty 1
  Filled 2014-06-08: qty 7
  Filled 2014-06-08 (×3): qty 1
  Filled 2014-06-08: qty 7
  Filled 2014-06-08 (×3): qty 1

## 2014-06-08 MED ORDER — RISPERIDONE 0.5 MG PO TBDP
0.5000 mg | ORAL_TABLET | Freq: Every day | ORAL | Status: DC
  Administered 2014-06-08 – 2014-06-09 (×2): 0.5 mg via ORAL
  Filled 2014-06-08 (×4): qty 1

## 2014-06-08 MED ORDER — RISPERIDONE 2 MG PO TBDP
3.0000 mg | ORAL_TABLET | Freq: Every day | ORAL | Status: DC
  Administered 2014-06-09: 3 mg via ORAL
  Filled 2014-06-08 (×4): qty 1

## 2014-06-08 MED ORDER — ALUM & MAG HYDROXIDE-SIMETH 200-200-20 MG/5ML PO SUSP: 30.0000 mL | ORAL | Status: DC | PRN

## 2014-06-08 MED ORDER — WARFARIN SODIUM 7.5 MG PO TABS
7.5000 mg | ORAL_TABLET | Freq: Every day | ORAL | Status: DC
  Filled 2014-06-08: qty 1

## 2014-06-08 MED ORDER — MAGNESIUM HYDROXIDE 400 MG/5ML PO SUSP: 30.0000 mL | Freq: Every day | ORAL | Status: DC | PRN

## 2014-06-08 MED ORDER — GABAPENTIN 300 MG PO CAPS
600.0000 mg | ORAL_CAPSULE | ORAL | Status: DC
  Administered 2014-06-08 – 2014-06-11 (×9): 600 mg via ORAL
  Filled 2014-06-08 (×13): qty 2

## 2014-06-08 MED ORDER — WARFARIN - PHARMACIST DOSING INPATIENT
Freq: Every day | Status: DC
  Administered 2014-06-12: 18:00:00
  Filled 2014-06-08 (×14): qty 1

## 2014-06-08 MED ORDER — TRAZODONE HCL 150 MG PO TABS
150.0000 mg | ORAL_TABLET | Freq: Every day | ORAL | Status: DC
  Administered 2014-06-09 – 2014-06-13 (×5): 150 mg via ORAL
  Filled 2014-06-08: qty 7
  Filled 2014-06-08 (×6): qty 1

## 2014-06-08 MED ORDER — RISPERIDONE 2 MG PO TBDP
4.0000 mg | ORAL_TABLET | Freq: Every day | ORAL | Status: DC
  Administered 2014-06-08: 4 mg via ORAL
  Filled 2014-06-08 (×3): qty 2

## 2014-06-08 MED ORDER — SIMVASTATIN 20 MG PO TABS
20.0000 mg | ORAL_TABLET | Freq: Every day | ORAL | Status: DC
  Administered 2014-06-08 – 2014-06-13 (×6): 20 mg via ORAL
  Filled 2014-06-08: qty 7
  Filled 2014-06-08 (×6): qty 1

## 2014-06-08 MED ORDER — HYDROXYZINE HCL 25 MG PO TABS
25.0000 mg | ORAL_TABLET | Freq: Four times a day (QID) | ORAL | Status: DC | PRN
Start: 2014-06-08 — End: 2014-06-14
  Administered 2014-06-09 – 2014-06-13 (×2): 25 mg via ORAL
  Filled 2014-06-08 (×2): qty 1

## 2014-06-08 MED ORDER — DIPHENHYDRAMINE HCL 25 MG PO CAPS
50.0000 mg | ORAL_CAPSULE | Freq: Once | ORAL | Status: AC
  Administered 2014-06-08: 50 mg via ORAL
  Filled 2014-06-08: qty 2

## 2014-06-08 MED ORDER — PANTOPRAZOLE SODIUM 20 MG PO TBEC
20.0000 mg | DELAYED_RELEASE_TABLET | Freq: Two times a day (BID) | ORAL | Status: DC
Start: 2014-06-08 — End: 2014-06-14
  Administered 2014-06-08 – 2014-06-14 (×13): 20 mg via ORAL
  Filled 2014-06-08: qty 1
  Filled 2014-06-08 (×2): qty 14
  Filled 2014-06-08 (×13): qty 1

## 2014-06-08 MED ORDER — BENZTROPINE MESYLATE 0.5 MG PO TABS
0.5000 mg | ORAL_TABLET | ORAL | Status: DC
  Administered 2014-06-08 – 2014-06-09 (×3): 0.5 mg via ORAL
  Filled 2014-06-08 (×7): qty 1

## 2014-06-08 MED ORDER — WARFARIN SODIUM 7.5 MG PO TABS
7.5000 mg | ORAL_TABLET | Freq: Once | ORAL | Status: AC
  Administered 2014-06-08: 7.5 mg via ORAL
  Filled 2014-06-08 (×2): qty 1

## 2014-06-08 NOTE — BHH Group Notes (Signed)
Adult Psychoeducational Group Note  Date:  06/08/2014 Time:  1330  Group Topic/Focus:  Life Skills  Participation Level:  Did Not Attend.   Patient stayed in bed.  Participation Quality:    Affect:    Cognitive:    Insight:   Engagement in Group:    Modes of Intervention:    Additional Comments:    Dale [name] 06/08/2014, 6:33 PM

## 2014-06-08 NOTE — ED Provider Notes (Signed)
Discussed with Shirlee Limerick with [facility]. Patient meets inpatient criteria and has a bed assignment at [facility], Dr. Dub Mikes accepting. Will discharge from the ED with [facility] transfer to [facility].  Arnoldo Hooker, PA-C 06/08/14 0139  Purvis Sheffield, MD 06/08/14 [phone]

## 2014-06-08 NOTE — Progress Notes (Addendum)
Patient has continued to sleep through all morning and part of afternoon.  Patient woke up and told NP that no one had tried to wake him up, no one had fed him, no one had given him any medications.  MHT and RN told NP and patient that we had attempted to wake patient several times, eat meals, snacks, and take his medications, but he continued to snore loudly.  Patient talked to NP, ate lunch, took medications after nurse talked to NP.  Patient complained of light being turned on to explain his medications to him.  After complaining, patient did take his medications, light turned off when nurse left room, and patient rolled over in bed and went back to sleep.  Safety continues to be maintained.  1610  Patient continues to sleep, snoring loudly.  Wheelchair beside bed.   1730  Patient refused to get out of bed to go to dinner, stated he was too tired and did not feel like going to dining room.  Food was brought to him for dinner, and patient was told that tomorrow he needs to get up and go to dining room with group.  Patient requested silverware to be handed to him that was laying on night stand.  Patient stated he was not in a good mood.  Safety maintained with 15 minute checks.  1900  Patient continues to sleep in bed, snoring loudly.  Safety maintained with 15 minute checks.

## 2014-06-08 NOTE — [facility] Group Notes (Signed)
The focus of this group is to educate the patient on the purpose and policies of crisis stabilization and provide a format to answer questions about their admission.  The group details unit policies and expectations of patients while admitted.  Patient did not attend 0900 nurse education orientation group this morning.  Patient continues to sleep.

## 2014-06-08 NOTE — [facility] Group Notes (Signed)
B[facility] Group Notes: (Clinical Social Work)   06/08/2014      Type of Therapy:  Group Therapy   Participation Level:  Did Not Attend despite MHT prompting   Calayah Guadarrama Grossman-Orr, LCSW 06/08/2014, 12:19 PM     
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
## 2014-06-08 NOTE — Progress Notes (Signed)
Patient continues to sleep all morning, snoring loudly.  Wheelchair beside bed with self inventory sheet in seat.  Patient was admitted last night, was lethargic, went to bed and continues to sleep.  Safety maintained with 15 minute checks.

## 2014-06-08 NOTE — Progress Notes (Signed)
ANTICOAGULATION CONSULT NOTE - Follow Up Consult  Pharmacy Consult for Coumadin Indication: h/o clots  Allergies  Allergen Reactions  . Ibuprofen Hives    Patient Measurements: Height: 5\' 11"  (180.3 cm) Weight: 191 lb (86.637 kg) IBW/kg (Calculated) : 75.3  Vital Signs: Temp: 97.8 F (36.6 C) (03/05 0418) Temp Source: Oral (03/05 0418) BP: 95/56 mmHg (03/05 0418) Pulse Rate: 90 (03/05 0418)  Labs:  Recent Labs  06/07/14 2107  HGB 15.7  HCT 45.3  PLT 231  LABPROT 14.2  INR 1.09  CREATININE 1.53*  TROPONINI <0.03    Estimated Creatinine Clearance: 63.6 mL/min (by C-G formula based on Cr of 1.53).   Medications:  Scheduled:  . benztropine  0.5 mg Oral BH-qamhs  . docusate sodium  100 mg Oral BID  . gabapentin  600 mg Oral [id number]  . pantoprazole  20 mg Oral BID AC  . risperiDONE  0.5 mg Oral Q1400  . risperiDONE  3 mg Oral Q breakfast  . risperiDONE  4 mg Oral QHS  . simvastatin  20 mg Oral q1800  . traZODone  150 mg Oral QHS  . warfarin  7.5 mg Oral Daily  . Warfarin - Pharmacist Dosing Inpatient   Does not apply q1800    Assessment: Patient known to pharmacy from admission several days ago.  Patient was non compliant on discharge with coumadin as INR was at goal on discharge and is currently 1.09.    Goal of Therapy:  INR 2-3    Plan:  Coumadin 7.5 today  PT/INR in am   Charyl Dancer 06/08/2014,7:29 AM
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
## 2014-06-08 NOTE — Progress Notes (Signed)
Psychoeducational Group Note  Date:  06/08/2014 Time:  2100  Group Topic/Focus:  wrap up group  Participation Level: Did Not Attend  Participation Quality:  Not Applicable  Affect:  Not Applicable  Cognitive:  Not Applicable  Insight:  Not Applicable  Engagement in Group: Not Applicable  Additional Comments:  Pt remained in bed during group time.   Shelah Lewandowsky 06/08/2014, 10:30 PM
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
## 2014-06-08 NOTE — Progress Notes (Signed)
D)  Has been sleeping almost the entire evening, came out to the dayroom briefly for a snack and went back to his room and back to sleep.  Meds were taken to him and was awakened, irritable..  Stated was tired, slept through group this evening also. A)  Will continue to monitor for safety, continue POC R)  Safety maintained.

## 2014-06-08 NOTE — Tx Team (Signed)
Initial Interdisciplinary Treatment Plan   PATIENT STRESSORS: Financial difficulties Health problems Legal issue Occupational concerns Substance abuse   PATIENT STRENGTHS: Average or above average intelligence General fund of knowledge   PROBLEM LIST: Problem List/Patient Goals Date to be addressed Date deferred Reason deferred Estimated date of resolution  Pt refused to answer      Suicidal ideation 06/08/14     Substance abuse 06/08/14                                          DISCHARGE CRITERIA:  Improved stabilization in mood, thinking, and/or behavior Need for constant or close observation no longer present  PRELIMINARY DISCHARGE PLAN: Attend 12-step recovery group Placement in alternative living arrangements  PATIENT/FAMIILY INVOLVEMENT: This treatment plan has been presented to and reviewed with the patient, Dale [name].  The patient and family have been given the opportunity to ask questions and make suggestions.  Arrie Aran 06/08/2014, 5:09 AM

## 2014-06-08 NOTE — Discharge Instructions (Signed)
TRANSFER BY [facility] TO [facility] FOR FURTHER TREATMENT.

## 2014-06-09 LAB — PROTIME-INR
INR: 1.07 (ref 0.00–1.49)
Prothrombin Time: 14 seconds (ref 11.6–15.2)

## 2014-06-09 MED ORDER — BENZTROPINE MESYLATE 1 MG/ML IJ SOLN
1.0000 mg | Freq: Once | INTRAMUSCULAR | Status: AC
  Administered 2014-06-09: 1 mg via INTRAMUSCULAR
  Filled 2014-06-09: qty 1
  Filled 2014-06-09: qty 2

## 2014-06-09 MED ORDER — WARFARIN SODIUM 10 MG PO TABS
10.0000 mg | ORAL_TABLET | Freq: Once | ORAL | Status: AC
  Administered 2014-06-09: 10 mg via ORAL
  Filled 2014-06-09: qty 1

## 2014-06-09 MED ORDER — BENZTROPINE MESYLATE 1 MG PO TABS
1.0000 mg | ORAL_TABLET | Freq: Once | ORAL | Status: AC
  Filled 2014-06-09: qty 1

## 2014-06-09 NOTE — Plan of Care (Signed)
Problem: Consults Goal: Depression Patient Education See Patient Education Module for education specifics.  Outcome: Not Progressing Patient irritable and would not discuss depression with nurse.
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
## 2014-06-09 NOTE — Progress Notes (Signed)
ANTICOAGULATION CONSULT NOTE - Follow Up Consult  Pharmacy Consult for coumadin Indication: h/o clots  Allergies  Allergen Reactions  . Ibuprofen Hives    Patient Measurements: Height: 5\' 11"  (180.3 cm) Weight: 191 lb (86.637 kg) IBW/kg (Calculated) : 75.3   Vital Signs: Temp: 98 F (36.7 C) (03/06 0630) Temp Source: Oral (03/06 0630) BP: 110/72 mmHg (03/06 0630) Pulse Rate: 78 (03/06 0630)  Labs:  Recent Labs  06/07/14 2107 06/09/14 0633  HGB 15.7  --   HCT 45.3  --   PLT 231  --   LABPROT 14.2 14.0  INR 1.09 1.07  CREATININE 1.53*  --   TROPONINI <0.03  --     Estimated Creatinine Clearance: 63.6 mL/min (by C-G formula based on Cr of 1.53).   Medications:  Scheduled:  . benztropine  0.5 mg Oral BH-qamhs  . docusate sodium  100 mg Oral BID  . gabapentin  600 mg Oral [id number]  . pantoprazole  20 mg Oral BID AC  . risperiDONE  0.5 mg Oral Q1400  . risperiDONE  3 mg Oral Q breakfast  . risperiDONE  4 mg Oral QHS  . simvastatin  20 mg Oral q1800  . traZODone  150 mg Oral QHS  . warfarin  10 mg Oral ONCE-1800  . Warfarin - Pharmacist Dosing Inpatient   Does not apply q1800    Assessment: INR well below goal no problems noted with therapy.  Non compliant with coumadin at home  Goal of Therapy:  INR 2-3    Plan:  Coumadin 10 mg x 1  PT/INR in am   Charyl Dancer 06/09/2014,11:31 AM

## 2014-06-09 NOTE — Progress Notes (Addendum)
1300  Nurse discussed with NP that patient would not get out of bed to go to groups or dining room.   Patient states no one wakes him up to go to groups or to go to dining room.  NP stated for MHT and nurse to go to patient's room together and talk to him, then document patient's responses.  Patient's lunch was brought to him because he said no one woke him up, but MHT did call all patients to go to dining room at appropriate time.  Patient very irritable.  Safety maintained with 15 minute checks.  1545  Patient continues to lay in bed sleeping, snoring loudly.  Patient did go to group after lunch.  Ambulates using wheelchair.  Patient presently laying in bed, snoring loudly.   Patient did not shower after lunch as he stated.  Safety maintained with 15 minute checks.    1605  Patient continues to lay in bed sleeping, snoring loudly. MHT and RN walked in patient's room and asked him if he would go to group recreation.  Patient refused.  Patient was asked 3 times by staff, patient refused 3 times to go.  Patient stated he wanted to stay in bed and sleep.  Patient refused snacks.  Safety maintained with 15 minute checks.  D:  Patient's self inventory sheet, patient stated he has poor sleep, sleep medication is helpful.  Poor appetite, low energy level, poor concentration.  Rated depression, hopeless and anxiety 10.  Stated he has tremors, sedation withdrawals.  SI sometimes.  Physical problems, pain R foot #10.  Pain medication is helpful.   A:  Medications administered per MD orders.  Emotional support and encouragement given patient. R:  Denied SI and HI while talking to nurse.  Contracts for safety.  Denied visual hallucinations.  Stated he does hear voices/mumblings occasionally.    1745  MHT and RN went to patient's room, talked to patient loudly, told patient it was time to get out of bed and go to dining room for dinner.  Patient stated he would get in his wheelchair.  Patient came down the hall, took  his medications and did go to dining room with group.  Patient continues to be irritable.  Safety maintained with 15 minute checks.  1845  RN administered cogentin 1 mg IM per NP instructions.  Patient has been in wheelchair, difficult to transfer from wheelchair to chair in NP office.  However, patient asked for shoes from locker and also a walker.  NP stated it is best to continue using wheelchair at this time.  Snack and ginger ale brought to patient because he said the cogentin injection made his stomach feel funny.  Staff continues to monitor patient for safety.  Safety maintained.

## 2014-06-09 NOTE — [facility] Group Notes (Signed)
[facility] Group Notes: (Clinical Social Work)   06/09/2014      Type of Therapy:  Group Therapy   Participation Level:  Did Not Attend despite MHT prompting   Ambrose Mantle, LCSW 06/09/2014, 12:17 PM

## 2014-06-09 NOTE — Progress Notes (Signed)
Patient did not attend the evening speaker AA meeting. Pt was notified that group was beginning but remained in bed. Pt was woken up and notified by this writer and pt declined.

## 2014-06-09 NOTE — [facility] Group Notes (Signed)
Adult Psychoeducational Group Note  Date:  06/09/2014 Time:  1330  Group Topic/Focus:  Making Healthy Choices:   The focus of this group is to help patients identify negative/unhealthy choices they were using prior to admission and identify positive/healthier coping strategies to replace them upon discharge.  Participation Level:  None  Participation Quality:  Inattentive  Affect:  Flat  Cognitive:  Lacking  Insight: None  Engagement in Group:  None  Modes of Intervention:  none  Additional Comments:    Earline Mayotte 06/09/2014, 5:34 PM

## 2014-06-09 NOTE — [facility] Group Notes (Signed)
The focus of this group is to educate the patient on the purpose and policies of crisis stabilization and provide a format to answer questions about their admission.  The group details unit policies and expectations of patients while admitted.  Patient did not attend 0900 nurse education orientation group this morning.  Patient stayed in bed, snoring loudly. Patient was asked to come to group.

## 2014-06-09 NOTE — Progress Notes (Signed)
D)  Was irritable this evening, refused to go to group, came out to the med window and sat there in a wheelchair until it was time for hs meds, took them, got a snack and went back to his room and went to bed.  Conversation was brief, poor eye contact, affect flat, easily irritated, entitled and demanding. A)  Will continue to monitor for safety,  Continue POC R)  Safety maintained.

## 2014-06-10 DIAGNOSIS — F333 Major depressive disorder, recurrent, severe with psychotic symptoms: Principal | ICD-10-CM

## 2014-06-10 DIAGNOSIS — F1424 Cocaine dependence with cocaine-induced mood disorder: Secondary | ICD-10-CM | POA: Diagnosis present

## 2014-06-10 LAB — PROTIME-INR
INR: 1.15 (ref 0.00–1.49)
PROTHROMBIN TIME: 14.8 s (ref 11.6–15.2)

## 2014-06-10 MED ORDER — LORAZEPAM 1 MG PO TABS
1.0000 mg | ORAL_TABLET | Freq: Four times a day (QID) | ORAL | Status: DC | PRN
  Administered 2014-06-10 – 2014-06-14 (×9): 1 mg via ORAL
  Filled 2014-06-10 (×10): qty 1

## 2014-06-10 MED ORDER — BENZTROPINE MESYLATE 1 MG PO TABS
1.0000 mg | ORAL_TABLET | Freq: Two times a day (BID) | ORAL | Status: DC
  Administered 2014-06-10 – 2014-06-12 (×4): 1 mg via ORAL
  Filled 2014-06-10 (×10): qty 1

## 2014-06-10 MED ORDER — GLYCERIN (LAXATIVE) 2.1 G RE SUPP
1.0000 | Freq: Every day | RECTAL | Status: DC | PRN
  Administered 2014-06-11 – 2014-06-13 (×3): 1 via RECTAL
  Filled 2014-06-10 (×7): qty 1

## 2014-06-10 MED ORDER — TRAMADOL HCL 50 MG PO TABS
50.0000 mg | ORAL_TABLET | Freq: Four times a day (QID) | ORAL | Status: DC | PRN
  Administered 2014-06-10 – 2014-06-14 (×7): 50 mg via ORAL
  Filled 2014-06-10 (×7): qty 1

## 2014-06-10 MED ORDER — TRAMADOL HCL 50 MG PO TABS: 50.0000 mg | ORAL_TABLET | Freq: Four times a day (QID) | ORAL | Status: DC

## 2014-06-10 MED ORDER — WARFARIN SODIUM 10 MG PO TABS
10.0000 mg | ORAL_TABLET | Freq: Once | ORAL | Status: AC
  Administered 2014-06-10: 10 mg via ORAL
  Filled 2014-06-10: qty 1

## 2014-06-10 MED ORDER — LIDOCAINE 5 % EX PTCH
1.0000 | MEDICATED_PATCH | Freq: Every day | CUTANEOUS | Status: DC
  Administered 2014-06-10 – 2014-06-13 (×4): 1 via TRANSDERMAL
  Filled 2014-06-10 (×2): qty 1
  Filled 2014-06-10: qty 7
  Filled 2014-06-10 (×4): qty 1
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
## 2014-06-10 NOTE — [facility] Suicide Risk Assessment (Signed)
[facility] INPATIENT:  Family/Significant Other Suicide Prevention Education  Suicide Prevention Education:  Patient Refusal for Family/Significant Other Suicide Prevention Education: The patient Dale [name] has refused to provide written consent for family/significant other to be provided Family/Significant Other Suicide Prevention Education during admission and/or prior to discharge.  Physician notified. SPE reviewed with patient and brochure provided. Patient encouraged to return to hospital if having suicidal thoughts, patient verbalized his/her understanding and has no further questions at this time.   [name], [place] 06/10/2014, 12:33 PM

## 2014-06-10 NOTE — Progress Notes (Signed)
Adult Psychoeducational Group Note  Date:  06/10/2014 Time:  9:53 PM  Group Topic/Focus:  Wrap-Up Group:   The focus of this group is to help patients review their daily goal of treatment and discuss progress on daily workbooks.  Participation Level:  Active  Participation Quality:  Sharing  Affect:  Appropriate  Cognitive:  Appropriate  Insight: Appropriate  Engagement in Group:  Engaged  Modes of Intervention:  Discussion  Additional Comments:  Pt stated that today was a good day because he is "still alive." pt also stated that today could have been a bad day because he was denied disability. Pt says that he is "greatful to be here when I received the news."  Dale [name] 06/10/2014, 9:53 PM

## 2014-06-10 NOTE — Progress Notes (Signed)
ANTICOAGULATION CONSULT NOTE - Follow Up Consult  Pharmacy Consult for coumadin Indication: h/o clots  Allergies  Allergen Reactions  . Ibuprofen Hives    Patient Measurements: Height: 5\' 11"  (180.3 cm) Weight: 191 lb (86.637 kg) IBW/kg (Calculated) : 75.3   Vital Signs: Temp: 98.5 F (36.9 C) (03/07 0630) Temp Source: Oral (03/07 0630) BP: 124/82 mmHg (03/07 0631) Pulse Rate: 93 (03/07 0631)  Labs:  Recent Labs  06/07/14 2107 06/09/14 0633 06/10/14 0630  HGB 15.7  --   --   HCT 45.3  --   --   PLT 231  --   --   LABPROT 14.2 14.0 14.8  INR 1.09 1.07 1.15  CREATININE 1.53*  --   --   TROPONINI <0.03  --   --     Estimated Creatinine Clearance: 63.6 mL/min (by C-G formula based on Cr of 1.53).   Medications:  Scheduled:  . docusate sodium  100 mg Oral BID  . gabapentin  600 mg Oral [id number]  . lidocaine  1 patch Transdermal Daily  . pantoprazole  20 mg Oral BID AC  . simvastatin  20 mg Oral q1800  . traZODone  150 mg Oral QHS  . warfarin  10 mg Oral ONCE-1800  . Warfarin - Pharmacist Dosing Inpatient   Does not apply q1800    Assessment: Protocol.  Inr increased slightly.  No problems noted with anticoagulation Goal of Therapy:  INR 2-3    Plan:  Coumadin 10 mg x 1  PT/INR in am   Charyl Dancer 06/10/2014,12:53 PM

## 2014-06-10 NOTE — Progress Notes (Signed)
D: Patient denies SI/HI and visual hallucinations; patient reports mumbling; patient reports sleep is fair and patient reported having a nightmare; reports appetite is fair; reports energy level is normal ; reports ability to concentrate is poor; rates depression as 10/10; rates hopelessness 10/10; rates anxiety as 10/10; patient reporting symptoms tremors and shakiness; patient complains of right foot pain  A: Monitored q 15 minutes; patient encouraged to attend groups; patient educated about medications; patient given medications per physician orders; patient encouraged to express feelings and/or concerns  R: Patient is irritable and demanding; patient is cooperative; patient wants to use a walker instead of a wheelchair; patient is attending some of the groups but not engaging; patient refused risperdal; patient is minimal and assertive

## 2014-06-10 NOTE — [facility] Group Notes (Signed)
   [facility] LCSW Aftercare Discharge Planning Group Note  06/10/2014  8:45 AM   Participation Quality: Alert, Appropriate and Oriented  Mood/Affect: Depressed and Flat  Depression Rating:  10  Anxiety Rating: 10  Thoughts of Suicide: Pt denies SI/HI  Will you contract for safety? Yes  Current AVH: Pt denies  Plan for Discharge/Comments: Pt attended discharge planning group and actively participated in group. CSW provided pt with today's workbook. Patient reports feeling "better" today. He reports being homeless and relapsed on cocaine when discharged from [facility] in Feb. 2016. Patient likely not eligible for residential treatment at this time due to pending legal charges.  Transportation Means: Pt reports access to transportation  Supports: No supports mentioned at this time  Samuella Bruin, MSW, [employer] Clinical Social Worker [employer] [phone]

## 2014-06-10 NOTE — Progress Notes (Signed)
Pt demanding, angry and irritable. "Don't talk to me as a child, call my Dale [name]". Demanding staff to go to locker to retrieve a telephone number and became upset when encouraged him to be positive. Will continue to monitor closely.

## 2014-06-10 NOTE — Clinical Social Work Note (Signed)
CSW met with patient to discuss discharge plans. Patient not eligible for residential treatment at this time due to a past assault charge and pending larceny charges. He requested an [facility] referral and declined [facility], [facility] (said that he has been declined there due to medical issues), and [facility]. He is agreeable to [facility] as a back up plan.  Tilden Fossa, MSW, [place] Worker [facility] [phone]

## 2014-06-10 NOTE — Progress Notes (Signed)
Pt alert, irritable but cooperative. Affect/ mood labile, depressed, hopeless and irritable. C/o constant right foot pain, see MAR. Demanding someone get his toenails clipped, approached MD in hall about issue. Reports intermittent SI "Not right now" , -HI, verbal contracts for safety. -A/[place]. "I am here to get help with PTSD and substance abuse", "I don't know what happened". Emotional support and encouragement given. Will monitor closely and evaluate for stabilization.

## 2014-06-10 NOTE — [facility] Group Notes (Signed)
[facility] LCSW Group Therapy 06/10/2014  1:15 pm  Type of Therapy: Group Therapy Participation Level: Active  Participation Quality: Attentive, Sharing and Supportive  Affect: Depressed and Flat  Cognitive: Alert and Oriented  Insight: Developing/Improving and Engaged  Engagement in Therapy: Developing/Improving and Engaged  Modes of Intervention: Clarification, Confrontation, Discussion, Education, Exploration,  Limit-setting, Orientation, Problem-solving, Rapport Building, Dance movement psychotherapist, Socialization and Support  Summary of Progress/Problems: Pt identified obstacles faced currently and processed barriers involved in overcoming these obstacles. Pt identified steps necessary for overcoming these obstacles and explored motivation (internal and external) for facing these difficulties head on. Pt further identified one area of concern in their lives and chose a goal to focus on for today. Patient reports identified "staying clean" as his short term goal. He hopes to do this with a structured environment and continued treatment. He states that he needs to "change people, places, and things". He identified his motivation for change as himself as well as wanting to make his father proud. CSW and other group members provided emotional support and encouragement.  Samuella Bruin, MSW, [employer] Clinical Social Worker [facility] [phone]

## 2014-06-10 NOTE — Clinical Social Work Note (Signed)
At patient request, referral faxed to [facility] for review.  Samuella Bruin, MSW, [employer] Clinical Social Worker [facility] [phone]

## 2014-06-11 LAB — PROTIME-INR
INR: 1.42 (ref 0.00–1.49)
Prothrombin Time: 17.5 seconds — ABNORMAL HIGH (ref 11.6–15.2)

## 2014-06-11 MED ORDER — GABAPENTIN 300 MG PO CAPS
600.0000 mg | ORAL_CAPSULE | Freq: Four times a day (QID) | ORAL | Status: DC
  Administered 2014-06-11 – 2014-06-14 (×12): 600 mg via ORAL
  Filled 2014-06-11 (×17): qty 2

## 2014-06-11 MED ORDER — WARFARIN SODIUM 10 MG PO TABS
10.0000 mg | ORAL_TABLET | Freq: Once | ORAL | Status: AC
  Administered 2014-06-11: 10 mg via ORAL
  Filled 2014-06-11: qty 1

## 2014-06-11 MED ORDER — NICOTINE POLACRILEX 2 MG MT GUM
2.0000 mg | CHEWING_GUM | OROMUCOSAL | Status: DC | PRN
  Administered 2014-06-12 – 2014-06-13 (×2): 2 mg via ORAL
  Filled 2014-06-11: qty 1

## 2014-06-11 NOTE — Progress Notes (Signed)
Recreation Therapy Notes  Animal-Assisted Activity/Therapy (AAA/T) Program Checklist/Progress Notes Patient Eligibility Criteria Checklist & Daily Group note for Rec Tx Intervention  Date: 03.08.2016 Time: 2:45pm Location: [address]    AAA/T Program Assumption of Risk Form signed by Patient/ or Parent Legal Guardian yes  Patient is free of allergies or sever asthma yes  Patient reports no fear of animals yes  Patient reports no history of cruelty to animals yes  Patient understands his/her participation is voluntary yes  Patient washes hands before animal contact yes  Patient washes hands after animal contact yes  Behavioral Response: Appropriate   Education: Hand Washing, Appropriate Animal Interaction   Education Outcome: Acknowledges education.   Clinical Observations/Feedback: Patient pet therapy dog appropriately from floor level and engaged with peers appropriately during session.  [name] [name], LRT/CTRS  Dale [name] 06/11/2014 4:57 PM

## 2014-06-11 NOTE — Progress Notes (Signed)
D: Patient denies HI and A/V hallucinations; patient reports sleep is fair; reports appetite is fair; reports energy level is low; reports ability to concentrate is poor; rates depression as 10/10; rates hopelessness 10/10; rates anxiety as 10/10; patient verbally denied SI but per self inventory patient reports thoughts of SI sometimes; patient reports pain with his right foot; patient reports cramping and chills  A: Monitored q 15 minutes; patient encouraged to attend groups; patient educated about medications; patient given medications per physician orders; patient encouraged to express feelings and/or concerns  R: Patient is assertive and demanding but brighter in his affect; patient is laughing and joking; patient is more pleasant today and not as irritbale; patient was able to set goal to talk with staff 1:1 when having feelings of SI; patient is taking medications as prescribed and tolerating medications; patient is attending all groups and engaging a little more

## 2014-06-11 NOTE — Progress Notes (Signed)
Pt alert, irritable angry and blaming others for his relapses /mishaps. Affect/ mood labile, depressed, hopeless and helpless. C/o constant right foot pain, see MAR. Reports intermittent SI, -HI, verbally contracts for safety. +A/hall hearing mumbling, -V/hall.  Emotional support and encouragement given. Will monitor closely and evaluate for stabilization.

## 2014-06-11 NOTE — Progress Notes (Signed)
ANTICOAGULATION CONSULT NOTE - Follow Up Consult  Pharmacy Consult for coumadin Indication:h/o clots  Allergies  Allergen Reactions  . Ibuprofen Hives    Patient Measurements: Height: 5\' 11"  (180.3 cm) Weight: 191 lb (86.637 kg) IBW/kg (Calculated) : 75.3 Vital Signs:    Labs:  Recent Labs  06/09/14 0633 06/10/14 0630 06/11/14 0634  LABPROT 14.0 14.8 17.5*  INR 1.07 1.15 1.42    Estimated Creatinine Clearance: 63.6 mL/min (by C-G formula based on Cr of 1.53).   Medications:  Scheduled:  . benztropine  1 mg Oral BID  . docusate sodium  100 mg Oral BID  . gabapentin  600 mg Oral [id number]  . lidocaine  1 patch Transdermal Daily  . pantoprazole  20 mg Oral BID AC  . simvastatin  20 mg Oral q1800  . traZODone  150 mg Oral QHS  . warfarin  10 mg Oral ONCE-1800  . Warfarin - Pharmacist Dosing Inpatient   Does not apply q1800    Assessment: INR below goal increasing with no problems noted  Goal of Therapy:  INR 2-3    Plan:  Coumadin 10 mg x 1  PT/INR in am   Charyl Dancer 06/11/2014,9:04 AM

## 2014-06-11 NOTE — Tx Team (Signed)
Interdisciplinary Treatment Plan Update (Adult) Date: 06/11/2014   Time Reviewed: 9:30 AM  Progress in Treatment: Attending groups: Yes Participating in groups: Yes Taking medication as prescribed: Yes Tolerating medication: Yes Family/Significant other contact made: No, patient has declined for CSW to make collateral contact Patient understands diagnosis: Yes Discussing patient identified problems/goals with staff: Yes Medical problems stabilized or resolved: Yes Denies suicidal/homicidal ideation: Yes Issues/concerns per patient self-inventory: Yes Other:  New problem(s) identified: N/A  Discharge Plan or Barriers:   3/8: CSW continuing to assess. Patient is homeless in [place] and is not eligible for residential treatment at [facility] or [facility] due to past assault charges as well as pending legal issues. Patient has no income and has declined [address], [place], and reports that he is not eligible for [facility] due to medical issues. Patient has requested a referral to [facility] (faxed 3/7) and reports that he would consider going to [facility] if he is not accepted to [facility].   Reason for Continuation of Hospitalization:  Depression Anxiety Medication Stabilization   Comments: N/A  Estimated length of stay: 2-3 days  For review of initial/current patient goals, please see plan of care. Patient is a 49 year old African American Male admitted for cocaine abuse and SI. Patient is homeless in [place]. He was recently discharged from [facility] in February 2016 to a local shelter to follow up with his outpatient provider[facility] but relapsed soon after. Patient will benefit from crisis stabilization, medication evaluation, group therapy, and psycho education in addition to case management for discharge planning. Patient and CSW reviewed pt's identified goals and treatment plan. Pt verbalized understanding and agreed to treatment  plan.    Attendees: Patient:    Family:    Physician: Dr. Jama Flavors; Dr. Dub Mikes 06/11/2014 9:30 AM  Nursing: Liborio Nixon, Christa Dopson, Quintella Reichert, [place], [place] 06/11/2014 9:30 AM  Clinical Social Worker: Samuella Bruin,  LCSWA 06/11/2014 9:30 AM  Other: Juline Patch, LCSW 06/11/2014 9:30 AM  Other:  Trula Slade, LCSWA 06/11/2014 9:30 AM  Other: Onnie Boer, Case Manager 06/11/2014 9:30 AM  Other: Serena Colonel, NP 06/11/2014 9:30 AM  Other:    Other:    Other:      Scribe for Treatment Team:  Samuella Bruin, MSW, [employer] [phone]

## 2014-06-11 NOTE — [facility] Group Notes (Signed)
The focus of this group is to educate the patient on the purpose and policies of crisis stabilization and provide a format to answer questions about their admission.  The group details unit policies and expectations of patients while admitted. Patient attended this group. 

## 2014-06-11 NOTE — [facility] Group Notes (Signed)
[facility] LCSW Group Therapy  06/11/2014   1:15 PM   Type of Therapy:  Group Therapy  Participation Level:  Minimal  Participation Quality: Minimal  Affect:  Lethargic, flat  Cognitive:  Alert and Oriented  Insight:  Developing/Improving and Engaged  Engagement in Therapy:  Developing/Improving and Engaged  Modes of Intervention:  Clarification, Confrontation, Discussion, Education, Exploration, Limit-setting, Orientation, Problem-solving, Rapport Building, Dance movement psychotherapist, Socialization and Support  Summary of Progress/Problems: The topic for group therapy was feelings about diagnosis.  Pt actively participated in group discussion on their past and current diagnosis and how they feel towards this.  Pt also identified how society and family members judge them, based on their diagnosis as well as stereotypes and stigmas.  Patient was observed sleeping during group and did not participate in discussion.  Samuella Bruin, MSW, [employer] Clinical Social Worker [facility] [phone]

## 2014-06-11 NOTE — Clinical Social Work Note (Signed)
CSW spoke with admissions staff at [facility], reports that patient's referral was received and is currently under review.   Samuella Bruin, MSW, [employer] Clinical Social Worker [facility] [phone]
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
## 2014-06-12 DIAGNOSIS — F1994 Other psychoactive substance use, unspecified with psychoactive substance-induced mood disorder: Secondary | ICD-10-CM | POA: Diagnosis present

## 2014-06-12 LAB — PROTIME-INR
INR: 1.7 — ABNORMAL HIGH (ref 0.00–1.49)
Prothrombin Time: 20.2 seconds — ABNORMAL HIGH (ref 11.6–15.2)

## 2014-06-12 MED ORDER — WARFARIN SODIUM 7.5 MG PO TABS
7.5000 mg | ORAL_TABLET | Freq: Once | ORAL | Status: AC
  Administered 2014-06-12: 7.5 mg via ORAL
  Filled 2014-06-12: qty 1

## 2014-06-12 MED ORDER — BENZTROPINE MESYLATE 1 MG PO TABS
1.0000 mg | ORAL_TABLET | Freq: Two times a day (BID) | ORAL | Status: DC
  Administered 2014-06-12 – 2014-06-14 (×4): 1 mg via ORAL
  Filled 2014-06-12 (×2): qty 1
  Filled 2014-06-12 (×2): qty 14
  Filled 2014-06-12 (×2): qty 1

## 2014-06-12 MED ORDER — ENSURE COMPLETE PO LIQD
237.0000 mL | Freq: Two times a day (BID) | ORAL | Status: DC
  Administered 2014-06-12 – 2014-06-13 (×2): 237 mL via ORAL
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
## 2014-06-12 NOTE — Clinical Social Work Note (Signed)
Updated progress note and consult note faxed to Formi at [facility] per her request on 06/12/14 at 2:50PM.  [facility], LCSWA 06/12/2014 2:59 PM

## 2014-06-12 NOTE — Progress Notes (Signed)
ANTICOAGULATION CONSULT NOTE - Follow Up Consult  Pharmacy Consult for Coumadin Indication: h/o clots  Allergies  Allergen Reactions  . Ibuprofen Hives    Patient Measurements: Height: 5\' 11"  (180.3 cm) Weight: 191 lb (86.637 kg) IBW/kg (Calculated) : 75.3   Vital Signs: Temp: 97.5 F (36.4 C) (03/09 0641) Temp Source: Oral (03/09 0641) BP: 144/74 mmHg (03/09 0642) Pulse Rate: 96 (03/09 0642)  Labs:  Recent Labs  06/10/14 0630 06/11/14 0634 06/12/14 0634  LABPROT 14.8 17.5* 20.2*  INR 1.15 1.42 1.70*    Estimated Creatinine Clearance: 63.6 mL/min (by C-G formula based on Cr of 1.53).   Medications:  Scheduled:  . benztropine  1 mg Oral BID  . docusate sodium  100 mg Oral BID  . gabapentin  600 mg Oral QID  . lidocaine  1 patch Transdermal Daily  . pantoprazole  20 mg Oral BID AC  . simvastatin  20 mg Oral q1800  . traZODone  150 mg Oral QHS  . Warfarin - Pharmacist Dosing Inpatient   Does not apply q1800    Assessment: INR increasing toward goal.  No problems noted with anticoagulation Goal of Therapy:  INR 2-3    Plan:  Coumadin 7.5 mg x 1 PT/INR in am Friday    Charyl Dancer 06/12/2014,8:51 AM

## 2014-06-12 NOTE — Clinical Social Work Note (Signed)
CSW left message with program supervisor at [facility] at 1:05PM/06-12-14 [phone] ext [phone]) to inquire about bed availability and admission process. Pt encouraged to call as well. CSW also left message with Form (intake coordinator) at [facility] at 1:10PM/06-12-14 to get update on status of referral.   Ladarion Munyon Smart, LCSWA 06/12/2014 1:17 PM

## 2014-06-12 NOTE — Progress Notes (Signed)
Pt alert, irritable but cooperative.  Affect/ mood labile, depressed and restrictive. Pt desires change and appears more hopeful today, positive interactions with staff and peers noted.  C/o constant right foot pain, see MAR. -SI/HI, verbally contracts for safety. +A/hall hearing mumbling, -V/hall. Pt visible in milieu and attended group. Emotional support and encouragement given. Will monitor closely and evaluate for stabilization.

## 2014-06-12 NOTE — Progress Notes (Signed)
Pt attended NA group this evening.  

## 2014-06-12 NOTE — Progress Notes (Addendum)
D: Pt denies SI but is having some HI/AV. Pt is pleasant and cooperative. Pt rates depression at a 8, anxiety at a 8, and Helplessness/hopelessness at a 8.  A: Pt was offered support and encouragement. Pt was given scheduled medications. Pt was encourage to attend groups. Q 15 minute checks were done for safety.  R:Pt attends groups and interacts well with peers and staff. Pt taking medication. Pt has no complaints.Pt receptive to treatment and safety maintained on unit.Pt can be labile at times but is easily redirected.

## 2014-06-12 NOTE — [facility] Group Notes (Signed)
[facility] LCSW Group Therapy  06/12/2014 3:17 PM  Type of Therapy:  Group Therapy  Participation Level:  None  Participation Quality:  Drowsy  Affect:  Lethargic  Cognitive:  Lacking  Insight:  Limited  Engagement in Therapy:  Limited  Modes of Intervention:  Confrontation, Discussion, Education, Exploration, Problem-solving, Rapport Building, Socialization and Support  Summary of Progress/Problems:  Today's Topic: Overcoming Obstacles. Pt identified obstacles faced currently and processed barriers involved in overcoming these obstacles. Pt identified steps necessary for overcoming these obstacles and explored motivation (internal and external) for facing these difficulties head on. Pt further identified one area of concern in their lives and chose a skill of focus pulled from their "toolbox." Dale [name] was in and out of the group room for the first 15 minutes of group stating that he had an important phone call to make. Dale [name] eventually came back into the group room and fell asleep in his chair. He stated that he felt drowsy because of the medication he was given after lunch and apologized for his inability to stay awake. At this time, Dale [name] demonstrates no progress in the group setting with improved mood. Pt pleasant and drowsy.   Smart, Josalyn Dettmann LCSWA  06/12/2014, 3:17 PM

## 2014-06-12 NOTE — [facility] Group Notes (Signed)
[facility] LCSW Aftercare Discharge Planning Group Note   06/12/2014 9:43 AM  Participation Quality:  Appropriate   Mood/Affect:  Depressed and Flat  Depression Rating:  10  Anxiety Rating:  10  Thoughts of Suicide:  No Will you contract for safety?   NA  Current AVH:  No  Plan for Discharge/Comments:  Pt reports that he is sleeping well but is asking for PT consult "for my foot." Pt rates pain as 8/10 and is requesting minipress "I'm having nightmares and the nurse wouldn't give it to me." Pt reports he is waiting on [facility] referral-[facility] is plan B. CSW provided pt with contact info and information about the program. CSW encouraged him to call and check out program/availability and admission process. Pt agreeable to this.   Transportation Means: bus?  Supports: none identified by pt.   Smart, [employer]

## 2014-06-12 NOTE — Progress Notes (Signed)
Recreation Therapy Notes  Date: 03.09.2016 Time: 9:30am Location: [address] Group Room   Group Topic: Stress Management  Goal Area(s) Addresses:  Patient will actively participate in stress management techniques presented during session.   Behavioral Response: Engaged, Attentive, Appropriate   Intervention: Art, Aromatherapy, Music  Activity :  LRT played meditation music, placed medicine cups containing cotton balls and drops of essential oils around room and provided each patient with their choice of 1 of 5 mandala's. Patients were asked to complete as much of their mandala as possible during 30 minutes stress management group.   Education:  Stress Management, Discharge Planning.   Education Outcome: Acknowledges education  Clinical Observations/Feedback: Patient actively engaged in group session, completing significant portion of mandala. Patient reported he felt a reduction in his stress level as a result of interaction in group activity. Additionally patient reported he can recreate therapeutic environment post d/c.   [name] Nhu Glasby, LRT/CTRS  Jozef Eisenbeis L 06/12/2014 2:06 PM

## 2014-06-13 DIAGNOSIS — F1424 Cocaine dependence with cocaine-induced mood disorder: Secondary | ICD-10-CM

## 2014-06-13 LAB — PROTIME-INR
INR: 1.96 — ABNORMAL HIGH (ref 0.00–1.49)
Prothrombin Time: 22.5 seconds — ABNORMAL HIGH (ref 11.6–15.2)

## 2014-06-13 MED ORDER — WARFARIN SODIUM 7.5 MG PO TABS
7.5000 mg | ORAL_TABLET | Freq: Every day | ORAL | Status: DC
  Administered 2014-06-13: 7.5 mg via ORAL
  Filled 2014-06-13 (×3): qty 1

## 2014-06-13 MED ORDER — GLYCERIN (LAXATIVE) 2.1 G RE SUPP: 1.0000 | Freq: Every day | RECTAL | Status: DC | PRN

## 2014-06-13 MED ORDER — TRAMADOL HCL 50 MG PO TABS: 50.0000 mg | ORAL_TABLET | Freq: Four times a day (QID) | ORAL | Status: DC | PRN

## 2014-06-13 MED ORDER — LIDOCAINE 5 % EX PTCH: 1.0000 | MEDICATED_PATCH | Freq: Every day | CUTANEOUS | Status: DC

## 2014-06-13 MED ORDER — TRAZODONE HCL 150 MG PO TABS: 150.0000 mg | ORAL_TABLET | Freq: Every day | ORAL | Status: DC

## 2014-06-13 MED ORDER — GABAPENTIN 600 MG PO TABS
600.0000 mg | ORAL_TABLET | Freq: Four times a day (QID) | ORAL | Status: DC
  Filled 2014-06-13 (×4): qty 28

## 2014-06-13 MED ORDER — BENZTROPINE MESYLATE 1 MG PO TABS: 1.0000 mg | ORAL_TABLET | Freq: Two times a day (BID) | ORAL | Status: DC

## 2014-06-13 MED ORDER — PHENYLEPH-SHARK LIV OIL-MO-PET 0.25-3-14-71.9 % RE OINT
TOPICAL_OINTMENT | Freq: Two times a day (BID) | RECTAL | Status: DC | PRN
  Filled 2014-06-13 (×2): qty 28.4

## 2014-06-13 MED ORDER — GABAPENTIN 300 MG PO CAPS: 600.0000 mg | ORAL_CAPSULE | Freq: Four times a day (QID) | ORAL | Status: DC

## 2014-06-13 MED ORDER — PHENYLEPH-SHARK LIV OIL-MO-PET 0.25-3-14-71.9 % RE OINT: TOPICAL_OINTMENT | Freq: Two times a day (BID) | RECTAL | Status: DC | PRN

## 2014-06-13 MED ORDER — HYDROXYZINE HCL 25 MG PO TABS
25.0000 mg | ORAL_TABLET | Freq: Four times a day (QID) | ORAL | Status: DC | PRN
Start: 2014-06-13 — End: 2014-11-28

## 2014-06-13 MED ORDER — WARFARIN SODIUM 7.5 MG PO TABS: 7.5000 mg | ORAL_TABLET | Freq: Every day | ORAL | Status: DC

## 2014-06-13 NOTE — Plan of Care (Signed)
Problem: Consults Goal: Depression Patient Education See Patient Education Module for education specifics.  Outcome: Completed/Met Date Met:  06/13/14 Nurse discussed depression with pt.
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
## 2014-06-13 NOTE — Progress Notes (Signed)
ANTICOAGULATION CONSULT NOTE - Follow Up Consult  Pharmacy Consult for Coumadin Indication: h/o clots  Allergies  Allergen Reactions  . Ibuprofen Hives    Patient Measurements: Height: 5\' 11"  (180.3 cm) Weight: 191 lb (86.637 kg) IBW/kg (Calculated) : 75.3   Vital Signs: Temp: 97.5 F (36.4 C) (03/10 0700) BP: 126/82 mmHg (03/10 1023) Pulse Rate: 90 (03/10 1023)  Labs:  Recent Labs  06/11/14 0634 06/12/14 0634 06/13/14 0629  LABPROT 17.5* 20.2* 22.5*  INR 1.42 1.70* 1.96*    Estimated Creatinine Clearance: 63.6 mL/min (by C-G formula based on Cr of 1.53).   Medications:  Scheduled:  . benztropine  1 mg Oral BID  . docusate sodium  100 mg Oral BID  . feeding supplement (ENSURE COMPLETE)  237 mL Oral BID BM  . gabapentin  600 mg Oral QID  . lidocaine  1 patch Transdermal Daily  . pantoprazole  20 mg Oral BID AC  . simvastatin  20 mg Oral q1800  . traZODone  150 mg Oral QHS  . warfarin  7.5 mg Oral q1800  . Warfarin - Pharmacist Dosing Inpatient   Does not apply q1800    Assessment: INRjust below goal  Goal of Therapy:  INR 2-3    Plan:  Will restart home dose Coumadin 7.5 mg po daily  PT/INR sat am labs   Charyl Dancer 06/13/2014,1:19 PM

## 2014-06-13 NOTE — Evaluation (Signed)
Physical Therapy Evaluation Patient Details Name: Dale [name] MRN: [id number] DOB: 11/02/1965 Today's Date: 06/13/2014   History of Present Illness  Pt in for opiate depence, PTSD, major depressive disorder, and complains of "thing" on bottom of R foot making walking painful and difficult.   Clinical Impression  Pt with flat affect, cooperative, but seemed a little frustrated with why I was seeing him. Did discuss his need to have an assistive device and feel a cane would work best for this pt . He will have to have one wherever he is discharging to , so please order if you agree. No further PT needs at this time.   Thanks    Follow Up Recommendations No PT follow up    Equipment Recommendations  Gilmer Mor (pt will need a cane wherever he is discharging to...)    Recommendations for Other Services       Precautions / Restrictions Precautions Precautions: None      Mobility  Bed Mobility                  Transfers Overall transfer level: Modified independent Equipment used: Rolling walker (2 wheeled)                Ambulation/Gait Ambulation/Gait assistance: Modified independent (Device/Increase time) Ambulation Distance (Feet): 100 Feet Assistive device: Rolling walker (2 wheeled)       General Gait Details: used walker a little, but did not have to relie on it much at all, just avoided full stance phase and weight shift to right foot due to pain on bottom of foot. Pt stated he really doesn't need the walker and used a coane at home. Really prefers a cone and feels he will progress better with a cane.   Stairs            Wheelchair Mobility    Modified Rankin (Stroke Patients Only)       Balance Overall balance assessment: Modified Independent                                           Pertinent Vitals/Pain Pain Assessment: Faces Faces Pain Scale: Hurts little more (pt stated it hurts to put weight on the bottom of  the right  foot becuase he has  some sort of callous or mass as he explains it. ) Pain Location: bottom of R foot  Pain Descriptors / Indicators: Jabbing Pain Intervention(s): Limited activity within patient's tolerance (RN aware)    Home Living Family/patient expects to be discharged to:: Other (Comment) (to a location in [place] (program.. see chart) ) Living Arrangements: Other (Comment) (homeless)                    Prior Function Level of Independence: Independent with assistive device(s) (used cane for a while now)         Comments: uses cane due to history of multiple surgeries on R foot     Hand Dominance        Extremity/Trunk Assessment               Lower Extremity Assessment: Overall WFL for tasks assessed         Communication   Communication: No difficulties  Cognition Arousal/Alertness: Awake/alert Behavior During Therapy: WFL for tasks assessed/performed;Flat affect (cooperative , but seemed irritable) Overall Cognitive Status: Within Functional Limits for tasks assessed  General Comments      Exercises        Assessment/Plan    PT Assessment Patent does not need any further PT services  PT Diagnosis Difficulty walking   PT Problem List    PT Treatment Interventions     PT Goals (Current goals can be found in the Care Plan section) Acute Rehab PT Goals Patient Stated Goal: pt did not talk a lot and was frustarated with why I had to see him  PT Goal Formulation: All assessment and education complete, DC therapy (educated pt to prop his LE s up when he feels they are swelling, this may also help with the swellign and pain in the right foot as well. )    Frequency     Barriers to discharge        Co-evaluation               End of Session   Activity Tolerance: Patient tolerated treatment well Patient left:  (in hallway at phone ) Nurse Communication: Mobility status (relayed that feel pt will need  cane for DC to [facility] tomorrow.)         Time: 1400-1420 PT Time Calculation (min) (ACUTE ONLY): 20 min   Charges:   PT Evaluation $Initial PT Evaluation Tier I: 1 Procedure     PT G CodesMarella Bile 2014-06-23, 3:04 PM  Marella Bile, PT Pager: [phone] Jun 23, 2014

## 2014-06-13 NOTE — Progress Notes (Addendum)
D:  Patient's feet/ankles swollen, ambulates with walker.   Patient's self inventory sheet, patient has fair sleep, no sleep medication given.  Good appetite, low energy level, poor concentration.  Rated depression, hopeless and anxiety #10.  Denied withdrawals.  SI thoughts sometimes, no plan, contracts for safety.  Has felt lightheaded, blurred vision, pain in past 24 hours.  Worst pain #10.  Right foot, leg also.  A: Medications administered per MD orders.  Emotional support and encouragement given patient. R:  Denied HI.  Denied A/V hallucinations.  SI off/on, contracts for safety.  Safety maintained with 15 minute checks. Would like MD to look at his ft/legs.  Lidocaine patch on bottom of R foot per MD orders. Patient stated suppository worked for him this morning. Staff stated he plans to stay off drugs and stay away from drug people after discharge.

## 2014-06-13 NOTE — [facility] Group Notes (Addendum)
The focus of this group is to educate the patient on the purpose and policies of crisis stabilization and provide a format to answer questions about their admission.  The group details unit policies and expectations of patients while admitted.  Patient attended 0900 nurse education orientation group this morning.  Patient actively participated, appropriate affect, alert, appropriate insight and engagement.  Today patient will work on 3 goals for discharge.  Stated he plans to stay off drugs and stay away from drug people after discharge.

## 2014-06-13 NOTE — Progress Notes (Signed)
  [facility] Adult Case Management Discharge Plan :  Will you be returning to the same living situation after discharge:  No-pt going to [facility] at d/c tomorrow, 06/14/14.  At discharge, do you have transportation home?: Yes,  bus pass and train ticket in chart. PT MUST DISCHARGE NO LATER THAN 7:00AM ON Friday, 06/14/14 IN ORDER TO CATCH TRAIN TO [place]. MD,NP, AND RN MADE AWARE.  Do you have the ability to pay for your medications: Yes,  mental health  Release of information consent forms completed and submitted to Medical Records by CSW.  Patient to Follow up at: Follow-up Information    Follow up with [facility] On 06/17/2014.   Why:  Appt on this date for hospital follow-up/medication management/therapy. Please bring photo ID and proof of income/if any. To cancel/reschedule, please call [phone].    Contact information:   [address], [place] [address] Phone: [phone] Fax: [phone]      Patient denies SI/HI: Yes,  during group/self report.     Safety Planning and Suicide Prevention discussed: Yes,  SPE completed with pt, as he refused to consent to family contact. SPI pamphlet provided to pt and he was encouraged to share information with support network, ask questions, and talk about any concerns relating to SPE.  Have you used any form of tobacco in the last 30 days? (Cigarettes, Smokeless Tobacco, Cigars, and/or Pipes): Yes  Has patient been referred to the Quitline?: Patient refused referral  Dale [name], Dale [name]  06/13/2014, 12:42 PM

## 2014-06-13 NOTE — Clinical Social Work Note (Addendum)
CSW spoke with Formi at [employer] [employer] [facility]. Pt continues to endorse AH, making him ineligible for [facility] at this time. Updated note must be sent to [facility] if/when he is no longer endorsing hallucinations.   [address] LCSWA 06/13/2014 8:49 AM   CSW spoke with Shyrl Numbers at [facility] admissions. He instructed pt to arrive between 8am-4pm on Friday for admission. "He must bring valid photo ID, not be sex offender, and be willing to work 40hrs week). Pt notified of this. Pt must go to welcome center at [address] [place] (recuse mission address) and someone will direct him from there (per Lamiel). CSW contacted Trey Paula at [facility]) to schedule follow-up appt for mental health services through the [facility]. Appt scheduled and pt [facility] for medical health needs per [place] at [facility]. (no appt needed). Pt concerned about transportation-no money and no ride to [facility] for Friday. CSW assessing.   [employer], LCSWA 06/13/2014 8:53 AM
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
## 2014-06-14 NOTE — Progress Notes (Signed)
Patient did attend the evening karaoke group.  

## 2014-06-18 NOTE — Progress Notes (Signed)
Patient Discharge Instructions:  After Visit Summary (AVS):   Faxed to:  06/18/14 Discharge Summary Note:   Faxed to:  06/18/14 Psychiatric Admission Assessment Note:   Faxed to:  06/18/14 Suicide Risk Assessment - Discharge Assessment:   Faxed to:  06/18/14 Faxed/Sent to the Next Level Care provider:  06/18/14 Faxed to [facility] @ [phone]  Jerelene Redden, 06/18/2014, 3:35 PM

## 2014-08-23 NOTE — Clinical Social Work Note (Signed)
Patient came to [facility] lobby seeking refills for medications prescribed at discharge from [facility] on 06/17/14.  CSW spoke w MD and patient. Given the length of time since discharge, MD unable to provide refills.  Patient advised to go to [facility], CSW assisted w ROI and provided this to Med Records, asking them to fax to [facility] stat.  Patient advised to present at [facility], be prepared to wait several hours and see multiple providers.  Patient encouraged to establish care at [facility] which is near his current placement at [facility].  Patient states he has been told that the wait to see mental health providers at [facility] is 4 - 6 weeks - patient aware and is on their waiting list.  Patient states he will go to [place] today.  Santa Genera, LCSW Clinical Social Worker

## 2014-10-11 ENCOUNTER — Encounter: Payer: Self-pay | Admitting: Internal Medicine

## 2014-10-11 ENCOUNTER — Ambulatory Visit: Payer: Self-pay | Attending: Internal Medicine | Admitting: Internal Medicine

## 2014-10-11 VITALS — BP 113/72 | HR 76 | Temp 98.0°F | Resp 16 | Wt 203.2 lb

## 2014-10-11 DIAGNOSIS — E785 Hyperlipidemia, unspecified: Secondary | ICD-10-CM | POA: Insufficient documentation

## 2014-10-11 DIAGNOSIS — Z7901 Long term (current) use of anticoagulants: Secondary | ICD-10-CM | POA: Insufficient documentation

## 2014-10-11 DIAGNOSIS — I82409 Acute embolism and thrombosis of unspecified deep veins of unspecified lower extremity: Secondary | ICD-10-CM | POA: Insufficient documentation

## 2014-10-11 DIAGNOSIS — Z5181 Encounter for therapeutic drug level monitoring: Secondary | ICD-10-CM | POA: Insufficient documentation

## 2014-10-11 LAB — POCT INR: INR: 3.2

## 2014-10-11 MED ORDER — WARFARIN SODIUM 10 MG PO TABS: 10.0000 mg | ORAL_TABLET | Freq: Every day | ORAL | Status: DC
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
## 2014-10-11 NOTE — Progress Notes (Signed)
Patient is here for follow up on his DVT and cholesterol Patient is also requesting a refill on his coumadin

## 2014-10-15 ENCOUNTER — Ambulatory Visit: Payer: Self-pay | Attending: Internal Medicine

## 2014-10-22 ENCOUNTER — Ambulatory Visit: Payer: Self-pay | Attending: Internal Medicine | Admitting: Pharmacist

## 2014-10-22 DIAGNOSIS — Z7901 Long term (current) use of anticoagulants: Secondary | ICD-10-CM

## 2014-10-22 LAB — POCT INR: INR: 2.6

## 2014-11-05 ENCOUNTER — Encounter: Payer: Self-pay | Admitting: Pharmacist

## 2014-11-11 ENCOUNTER — Encounter (HOSPITAL_COMMUNITY): Payer: Self-pay | Admitting: *Deleted

## 2014-11-11 ENCOUNTER — Emergency Department (HOSPITAL_COMMUNITY)
Admission: EM | Admit: 2014-11-11 | Discharge: 2014-11-11 | Disposition: A | Discharge status: Other Acute Inpatient Hospital | Payer: Self-pay | Attending: Emergency Medicine | Admitting: Emergency Medicine

## 2014-11-11 ENCOUNTER — Inpatient Hospital Stay (HOSPITAL_COMMUNITY)
Admission: AD | Admit: 2014-11-11 | Discharge: 2014-11-28 | DRG: 885 | Disposition: A | Discharge status: Home / Self Care | Payer: Federal, State, Local not specified - Other | Source: Intra-hospital | Attending: Psychiatry | Admitting: Psychiatry

## 2014-11-11 DIAGNOSIS — I10 Essential (primary) hypertension: Secondary | ICD-10-CM | POA: Insufficient documentation

## 2014-11-11 DIAGNOSIS — Z79899 Other long term (current) drug therapy: Secondary | ICD-10-CM

## 2014-11-11 DIAGNOSIS — Z9889 Other specified postprocedural states: Secondary | ICD-10-CM | POA: Diagnosis not present

## 2014-11-11 DIAGNOSIS — R451 Restlessness and agitation: Secondary | ICD-10-CM | POA: Diagnosis present

## 2014-11-11 DIAGNOSIS — F1420 Cocaine dependence, uncomplicated: Secondary | ICD-10-CM

## 2014-11-11 DIAGNOSIS — F1994 Other psychoactive substance use, unspecified with psychoactive substance-induced mood disorder: Secondary | ICD-10-CM

## 2014-11-11 DIAGNOSIS — F3164 Bipolar disorder, current episode mixed, severe, with psychotic features: Principal | ICD-10-CM | POA: Diagnosis present

## 2014-11-11 DIAGNOSIS — F323 Major depressive disorder, single episode, severe with psychotic features: Secondary | ICD-10-CM | POA: Diagnosis present

## 2014-11-11 DIAGNOSIS — F419 Anxiety disorder, unspecified: Secondary | ICD-10-CM | POA: Insufficient documentation

## 2014-11-11 DIAGNOSIS — F1410 Cocaine abuse, uncomplicated: Secondary | ICD-10-CM | POA: Insufficient documentation

## 2014-11-11 DIAGNOSIS — G4700 Insomnia, unspecified: Secondary | ICD-10-CM | POA: Diagnosis present

## 2014-11-11 DIAGNOSIS — Z7901 Long term (current) use of anticoagulants: Secondary | ICD-10-CM | POA: Diagnosis not present

## 2014-11-11 DIAGNOSIS — K219 Gastro-esophageal reflux disease without esophagitis: Secondary | ICD-10-CM | POA: Diagnosis present

## 2014-11-11 DIAGNOSIS — F17210 Nicotine dependence, cigarettes, uncomplicated: Secondary | ICD-10-CM | POA: Diagnosis present

## 2014-11-11 DIAGNOSIS — Z888 Allergy status to other drugs, medicaments and biological substances status: Secondary | ICD-10-CM

## 2014-11-11 DIAGNOSIS — F1020 Alcohol dependence, uncomplicated: Secondary | ICD-10-CM

## 2014-11-11 DIAGNOSIS — Z8711 Personal history of peptic ulcer disease: Secondary | ICD-10-CM

## 2014-11-11 DIAGNOSIS — Z833 Family history of diabetes mellitus: Secondary | ICD-10-CM

## 2014-11-11 DIAGNOSIS — F333 Major depressive disorder, recurrent, severe with psychotic symptoms: Secondary | ICD-10-CM

## 2014-11-11 DIAGNOSIS — Z818 Family history of other mental and behavioral disorders: Secondary | ICD-10-CM

## 2014-11-11 DIAGNOSIS — F4310 Post-traumatic stress disorder, unspecified: Secondary | ICD-10-CM

## 2014-11-11 DIAGNOSIS — Z87448 Personal history of other diseases of urinary system: Secondary | ICD-10-CM | POA: Insufficient documentation

## 2014-11-11 DIAGNOSIS — R45851 Suicidal ideations: Secondary | ICD-10-CM | POA: Diagnosis present

## 2014-11-11 DIAGNOSIS — Z87828 Personal history of other (healed) physical injury and trauma: Secondary | ICD-10-CM | POA: Insufficient documentation

## 2014-11-11 DIAGNOSIS — R4589 Other symptoms and signs involving emotional state: Secondary | ICD-10-CM

## 2014-11-11 DIAGNOSIS — Z86711 Personal history of pulmonary embolism: Secondary | ICD-10-CM | POA: Diagnosis not present

## 2014-11-11 DIAGNOSIS — R4689 Other symptoms and signs involving appearance and behavior: Secondary | ICD-10-CM

## 2014-11-11 DIAGNOSIS — F515 Nightmare disorder: Secondary | ICD-10-CM | POA: Diagnosis not present

## 2014-11-11 DIAGNOSIS — Z862 Personal history of diseases of the blood and blood-forming organs and certain disorders involving the immune mechanism: Secondary | ICD-10-CM | POA: Insufficient documentation

## 2014-11-11 DIAGNOSIS — Z872 Personal history of diseases of the skin and subcutaneous tissue: Secondary | ICD-10-CM | POA: Insufficient documentation

## 2014-11-11 DIAGNOSIS — Z809 Family history of malignant neoplasm, unspecified: Secondary | ICD-10-CM

## 2014-11-11 DIAGNOSIS — Z720 Tobacco use: Secondary | ICD-10-CM | POA: Insufficient documentation

## 2014-11-11 LAB — COMPREHENSIVE METABOLIC PANEL
ALK PHOS: 42 U/L (ref 38–126)
ALT: 22 U/L (ref 17–63)
AST: 25 U/L (ref 15–41)
Albumin: 3.2 g/dL — ABNORMAL LOW (ref 3.5–5.0)
Anion gap: 12 (ref 5–15)
BILIRUBIN TOTAL: 1.1 mg/dL (ref 0.3–1.2)
BUN: 19 mg/dL (ref 6–20)
CO2: 21 mmol/L — ABNORMAL LOW (ref 22–32)
Calcium: 8.4 mg/dL — ABNORMAL LOW (ref 8.9–10.3)
Chloride: 105 mmol/L (ref 101–111)
Creatinine, Ser: 1.56 mg/dL — ABNORMAL HIGH (ref 0.61–1.24)
GFR calc Af Amer: 59 mL/min — ABNORMAL LOW (ref >60)
GFR, EST NON AFRICAN AMERICAN: 51 mL/min — AB (ref >60)
Glucose, Bld: 100 mg/dL — ABNORMAL HIGH (ref 65–99)
Potassium: 3.4 mmol/L — ABNORMAL LOW (ref 3.5–5.1)
SODIUM: 138 mmol/L (ref 135–145)
TOTAL PROTEIN: 5.1 g/dL — AB (ref 6.5–8.1)

## 2014-11-11 LAB — RAPID URINE DRUG SCREEN, HOSP PERFORMED
AMPHETAMINES: NOT DETECTED
BENZODIAZEPINES: NOT DETECTED
Barbiturates: NOT DETECTED
Cocaine: POSITIVE — AB
Opiates: NOT DETECTED
TETRAHYDROCANNABINOL: NOT DETECTED

## 2014-11-11 LAB — CBC
HCT: 47.5 % (ref 39.0–52.0)
Hemoglobin: 16.7 g/dL (ref 13.0–17.0)
MCH: 30.9 pg (ref 26.0–34.0)
MCHC: 35.2 g/dL (ref 30.0–36.0)
MCV: 87.8 fL (ref 78.0–100.0)
PLATELETS: 210 10*3/uL (ref 150–400)
RBC: 5.41 MIL/uL (ref 4.22–5.81)
RDW: 13.4 % (ref 11.5–15.5)
WBC: 6.8 10*3/uL (ref 4.0–10.5)

## 2014-11-11 LAB — PROTIME-INR
INR: 1.13 (ref 0.00–1.49)
PROTHROMBIN TIME: 14.7 s (ref 11.6–15.2)

## 2014-11-11 LAB — ETHANOL: Alcohol, Ethyl (B): <5 mg/dL (ref <5)

## 2014-11-11 MED ORDER — SIMVASTATIN 20 MG PO TABS
20.0000 mg | ORAL_TABLET | Freq: Every day | ORAL | Status: DC
  Filled 2014-11-11: qty 1

## 2014-11-11 MED ORDER — ALUM & MAG HYDROXIDE-SIMETH 200-200-20 MG/5ML PO SUSP: 30.0000 mL | ORAL | Status: DC | PRN

## 2014-11-11 MED ORDER — PANTOPRAZOLE SODIUM 20 MG PO TBEC
20.0000 mg | DELAYED_RELEASE_TABLET | Freq: Two times a day (BID) | ORAL | Status: DC
  Filled 2014-11-11 (×2): qty 1

## 2014-11-11 MED ORDER — TRAZODONE HCL 50 MG PO TABS: 150.0000 mg | ORAL_TABLET | Freq: Every day | ORAL | Status: DC

## 2014-11-11 MED ORDER — TRAZODONE HCL 100 MG PO TABS: 100.0000 mg | ORAL_TABLET | Freq: Every evening | ORAL | Status: DC | PRN

## 2014-11-11 MED ORDER — WARFARIN SODIUM 10 MG PO TABS
10.0000 mg | ORAL_TABLET | Freq: Once | ORAL | Status: AC
  Administered 2014-11-11: 10 mg via ORAL
  Filled 2014-11-11: qty 1

## 2014-11-11 MED ORDER — MAGNESIUM HYDROXIDE 400 MG/5ML PO SUSP: 30.0000 mL | Freq: Every day | ORAL | Status: DC | PRN

## 2014-11-11 MED ORDER — POTASSIUM CHLORIDE CRYS ER 20 MEQ PO TBCR
20.0000 meq | EXTENDED_RELEASE_TABLET | Freq: Two times a day (BID) | ORAL | Status: AC
  Administered 2014-11-11 – 2014-11-13 (×4): 20 meq via ORAL
  Filled 2014-11-11 (×6): qty 1

## 2014-11-11 MED ORDER — CHLORDIAZEPOXIDE HCL 25 MG PO CAPS: 25.0000 mg | ORAL_CAPSULE | Freq: Four times a day (QID) | ORAL | Status: AC | PRN

## 2014-11-11 MED ORDER — HYDROXYZINE HCL 25 MG PO TABS: 25.0000 mg | ORAL_TABLET | Freq: Four times a day (QID) | ORAL | Status: DC | PRN

## 2014-11-11 MED ORDER — ACETAMINOPHEN 325 MG PO TABS: 650.0000 mg | ORAL_TABLET | Freq: Four times a day (QID) | ORAL | Status: DC | PRN

## 2014-11-11 MED ORDER — DOCUSATE SODIUM 100 MG PO CAPS
100.0000 mg | ORAL_CAPSULE | Freq: Two times a day (BID) | ORAL | Status: DC
  Administered 2014-11-11: 100 mg via ORAL
  Filled 2014-11-11: qty 1

## 2014-11-11 MED ORDER — WARFARIN - PHARMACIST DOSING INPATIENT: Freq: Every day | Status: DC

## 2014-11-11 MED ORDER — TRAMADOL HCL 50 MG PO TABS: 50.0000 mg | ORAL_TABLET | Freq: Four times a day (QID) | ORAL | Status: DC | PRN

## 2014-11-11 MED ORDER — HYDROXYZINE HCL 25 MG PO TABS
25.0000 mg | ORAL_TABLET | Freq: Four times a day (QID) | ORAL | Status: DC | PRN
  Administered 2014-11-11: 25 mg via ORAL
  Filled 2014-11-11: qty 1

## 2014-11-11 MED ORDER — BENZTROPINE MESYLATE 1 MG PO TABS
1.0000 mg | ORAL_TABLET | Freq: Two times a day (BID) | ORAL | Status: DC
  Administered 2014-11-11: 1 mg via ORAL
  Filled 2014-11-11: qty 1

## 2014-11-11 MED ORDER — WARFARIN SODIUM 10 MG PO TABS: 10.0000 mg | ORAL_TABLET | Freq: Every day | ORAL | Status: DC

## 2014-11-11 MED ORDER — GABAPENTIN 300 MG PO CAPS
600.0000 mg | ORAL_CAPSULE | Freq: Four times a day (QID) | ORAL | Status: DC
  Administered 2014-11-11: 600 mg via ORAL
  Filled 2014-11-11: qty 2

## 2014-11-11 MED ORDER — OLANZAPINE 5 MG PO TBDP
5.0000 mg | ORAL_TABLET | Freq: Three times a day (TID) | ORAL | Status: DC | PRN
  Administered 2014-11-13: 5 mg via ORAL
  Filled 2014-11-11: qty 1
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
## 2014-11-11 NOTE — ED Notes (Signed)
Staffing office notified of the need for a sitter.  Chg rn notified.  Security called to wand the pt

## 2014-11-11 NOTE — Progress Notes (Signed)
Seeking inpatient psychiatric placement for pt.  Referred to: [facility]- per Annice Pih [facility]- per [facility]- per Dustin Flock- per [facility] (for waitlist, no beds currently)- per [facility]- per Renea Ee  At capacity: [facility] (gero only today)  Unable to make contact with Berton Lan, [employer], or [place]-- will continue attempting and refer pt if appropriate.  Ilean Skill, MSW, LCSW Clinical Social Work, Disposition  11/11/2014 [phone]

## 2014-11-11 NOTE — Progress Notes (Addendum)
Accepted to [facility] bed 305-2 per [facility], acceptig Dr. Dub Mikes. RN Aimie informed. Call report at [phone].  Melbourne Abts, LCSWA Disposition staff 11/11/2014 3:56 PM

## 2014-11-11 NOTE — ED Notes (Signed)
The pt is c/o feeling suicidal and tired just tired

## 2014-11-11 NOTE — ED Notes (Signed)
Attempted report 

## 2014-11-11 NOTE — Tx Team (Addendum)
Initial Interdisciplinary Treatment Plan   PATIENT STRESSORS: Financial difficulties Health problems auditory hallucinations   PATIENT STRENGTHS: Average or above average intelligence  Capable of independent living    PROBLEM LIST: Problem List/Patient Goals Date to be addressed Date deferred Reason deferred Estimated date of resolution  Pt reports " I'm angry and agitated I feel like hurting myself and anybody else that makes me mad."   11/11/2014     Alteration in Mood. 2016     Suicidal ideation 2016                                          DISCHARGE CRITERIA:  Improved stabilization in mood, thinking, and/or behavior Need for constant or close observation no longer present  PRELIMINARY DISCHARGE PLAN: Attend aftercare/continuing care group  PATIENT/FAMIILY INVOLVEMENT: This treatment plan has been presented to and reviewed with the patient, Glover Dibert.  Rudi Rummage 11/11/2014, 7:08 PM

## 2014-11-11 NOTE — ED Notes (Signed)
The pt  Is using cocaine pot and he has been drinking alcohol.. Last alcohol was yesterday last alcohol was early am
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
## 2014-11-11 NOTE — Progress Notes (Signed)
Adult Psychoeducational Group Note  Date:  11/11/2014 Time:  8:27 PM  Group Topic/Focus:  Wrap-Up Group:   The focus of this group is to help patients review their daily goal of treatment and discuss progress on daily workbooks.  Participation Level:  Did Not Attend  Additional Comments:  Pt was in bed at the time of group. Pt was invited to attend, however he stayed in his room room due to agitation.   Caswell Corwin 11/11/2014, 8:27 PM
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
## 2014-11-11 NOTE — BH Assessment (Signed)
Received notification of TTS consult request. Spoke to [facility], PA-C who said Pt reports suicidal ideation, paranoia and is abusing alcohol and cocaine. Tele-assessment will be initiated.  Harlin Rain Patsy [place], LPC, [facility], [facility] Triage Specialist [phone]

## 2014-11-12 ENCOUNTER — Encounter (HOSPITAL_COMMUNITY): Payer: Self-pay | Admitting: Psychiatry

## 2014-11-12 DIAGNOSIS — F3164 Bipolar disorder, current episode mixed, severe, with psychotic features: Principal | ICD-10-CM

## 2014-11-12 DIAGNOSIS — F1420 Cocaine dependence, uncomplicated: Secondary | ICD-10-CM

## 2014-11-12 DIAGNOSIS — F1020 Alcohol dependence, uncomplicated: Secondary | ICD-10-CM

## 2014-11-12 MED ORDER — WARFARIN SODIUM 10 MG PO TABS
10.0000 mg | ORAL_TABLET | Freq: Once | ORAL | Status: AC
  Administered 2014-11-12: 10 mg via ORAL
  Filled 2014-11-12: qty 1

## 2014-11-12 MED ORDER — DOCUSATE SODIUM 100 MG PO CAPS
100.0000 mg | ORAL_CAPSULE | Freq: Two times a day (BID) | ORAL | Status: DC
  Administered 2014-11-12 – 2014-11-27 (×31): 100 mg via ORAL
  Filled 2014-11-12 (×34): qty 1

## 2014-11-12 MED ORDER — TRAZODONE HCL 150 MG PO TABS
150.0000 mg | ORAL_TABLET | Freq: Every day | ORAL | Status: DC
  Administered 2014-11-12 – 2014-11-14 (×3): 150 mg via ORAL
  Filled 2014-11-12 (×6): qty 1

## 2014-11-12 MED ORDER — TRAMADOL HCL 50 MG PO TABS
50.0000 mg | ORAL_TABLET | Freq: Four times a day (QID) | ORAL | Status: DC | PRN
  Administered 2014-11-12 – 2014-11-20 (×14): 50 mg via ORAL
  Filled 2014-11-12 (×13): qty 1

## 2014-11-12 MED ORDER — PANTOPRAZOLE SODIUM 20 MG PO TBEC
20.0000 mg | DELAYED_RELEASE_TABLET | Freq: Two times a day (BID) | ORAL | Status: DC
  Administered 2014-11-12 – 2014-11-23 (×22): 20 mg via ORAL
  Filled 2014-11-12 (×25): qty 1

## 2014-11-12 MED ORDER — HYDROXYZINE HCL 25 MG PO TABS: 25.0000 mg | ORAL_TABLET | Freq: Four times a day (QID) | ORAL | Status: DC | PRN

## 2014-11-12 MED ORDER — WARFARIN - PHARMACIST DOSING INPATIENT
Freq: Every day | Status: DC
  Administered 2014-11-17 – 2014-11-22 (×2): 1
  Administered 2014-11-24: 18:00:00
  Filled 2014-11-12 (×19): qty 1

## 2014-11-12 MED ORDER — SIMVASTATIN 20 MG PO TABS
20.0000 mg | ORAL_TABLET | Freq: Every day | ORAL | Status: DC
  Administered 2014-11-12 – 2014-11-27 (×16): 20 mg via ORAL
  Filled 2014-11-12 (×18): qty 1

## 2014-11-12 MED ORDER — GABAPENTIN 300 MG PO CAPS
600.0000 mg | ORAL_CAPSULE | Freq: Four times a day (QID) | ORAL | Status: DC
  Administered 2014-11-12 – 2014-11-20 (×31): 600 mg via ORAL
  Filled 2014-11-12 (×43): qty 2
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
## 2014-11-12 NOTE — Progress Notes (Signed)
D: Patient alert and oriented x 4. Patient denies SI/HI/AVH and pain. Patient was very brief with questions when answering but cooperated.  A: Staff to monitor Q 15 mins for safety. Encouragement and support offered. Scheduled medications administered per orders. R: Patient remains safe on the unit. Patient taking administered medications.
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
## 2014-11-12 NOTE — B[facility] Group Notes (Signed)
[facility] LCSW Group Therapy  11/12/2014 1:24 PM  Type of Therapy:  Group Therapy  Participation Level:  Active  Participation Quality:  Attentive  Affect:  Flat  Cognitive:  Oriented  Insight:  Limited  Engagement in Therapy:  Limited  Modes of Intervention:  Discussion, Education, Exploration, Problem-solving, Rapport Building, Socialization and Support  Summary of Progress/Problems: [name] came to talk about his personal journey with substance abuse and addiction. The pt processed ways by which to relate to the speaker. [name] provided handouts and educational information pertaining to groups and services offered by the [facility].   [name], Dale [place] LCSWA 11/12/2014, 1:24 PM

## 2014-11-12 NOTE — Progress Notes (Signed)
ANTICOAGULATION CONSULT NOTE - Follow Up Consult  Pharmacy Consult for Coumadin Indication: h/o DVT  Allergies  Allergen Reactions  . Ibuprofen Hives    Patient Measurements: Height: 5\' 11"  (180.3 cm) Weight: 188 lb (85.276 kg) IBW/kg (Calculated) : 75.3   Vital Signs: Temp: 97.7 F (36.5 C) (08/09 0754) BP: 98/71 mmHg (08/09 0755) Pulse Rate: 88 (08/09 0755)  Labs:  Recent Labs  11/11/14 0121 11/11/14 1204  HGB 16.7  --   HCT 47.5  --   PLT 210  --   LABPROT  --  14.7  INR  --  1.13  CREATININE 1.56*  --     Estimated Creatinine Clearance: 61.7 mL/min (by C-G formula based on Cr of 1.56).   Medications:  Scheduled:  . potassium chloride  20 mEq Oral BID    Assessment: INR below goal as pt has not taken since 7/31.  No problems with bleeding noted. Goal of Therapy:  INR 2-3    Plan:  INR missed on transfer to [facility]  Will give coumadin 10 mg x 1 today as pt has been non compliant since 7/31 Will drawer PT/INR in am  F/U in am   Charyl Dancer 11/12/2014,8:29 AM

## 2014-11-12 NOTE — Tx Team (Addendum)
Interdisciplinary Treatment Plan Update (Adult) Date: 11/12/2014    Time Reviewed: 9:30 AM  Progress in Treatment: Attending groups: Continuing to assess, patient new to milieu Participating in groups: Continuing to assess, patient new to milieu Taking medication as prescribed: Yes Tolerating medication: Yes Family/Significant other contact made: No, CSW assessing for appropriate contacts Patient understands diagnosis: Yes Discussing patient identified problems/goals with staff: Yes Medical problems stabilized or resolved: Yes Denies suicidal/homicidal ideation: No, endorsing SI Issues/concerns per patient self-inventory: Yes Other:  New problem(s) identified: N/A  Discharge Plan or Barriers: 11/12/2014:  CSW continuing to assess, patient new to milieu.  Reason for Continuation of Hospitalization:  Depression Anxiety Medication Stabilization   Comments: N/A  Estimated length of stay: 3-5 days   Patient is a 49 year old African American male admitted for SI, HI, AH, and cocaine/ETOH detox. Patient is homeless in [place]. Patient will benefit from crisis stabilization, medication evaluation, group therapy, and psycho education in addition to case management for discharge planning. Patient and CSW reviewed pt's identified goals and treatment plan. Pt verbalized understanding and agreed to treatment plan.     Review of initial/current patient goals per problem list:  1. Goal(s): Patient will participate in aftercare plan   Met: No   Target date: 3-5 days post admission date   As evidenced by: Patient will participate within aftercare plan AEB aftercare provider and housing plan at discharge being identified.  11/12/2014: Goal not met: CSW assessing for appropriate referrals for pt and will have follow up secured prior to d/c.    2. Goal (s): Patient will exhibit decreased depressive symptoms and suicidal ideations.   Met: No   Target date: 3-5 days post  admission date   As evidenced by: Patient will utilize self rating of depression at 3 or below and demonstrate decreased signs of depression or be deemed stable for discharge by MD.  11/12/2014: Goal not met: Pt presents with flat affect and depressed mood.  Pt admitted with depression rating of 10.  Pt to show decreased sign of depression and a rating of 3 or less before d/c.     3. Goal(s): Patient will demonstrate decreased signs and symptoms of anxiety.   Met: No   Target date: 3-5 days post admission date   As evidenced by: Patient will utilize self rating of anxiety at 3 or below and demonstrated decreased signs of anxiety, or be deemed stable for discharge by MD  11/12/2014: Goal not met: Pt presents with anxious mood and affect.  Pt admitted with anxiety rating of 10.  Pt to show decreased sign of anxiety and a rating of 3 or less before d/c.     4. Goal(s): Patient will demonstrate decreased signs of withdrawal due to substance abuse   Met: Yes   Target date: 3-5 days post admission date   As evidenced by: Patient will produce a CIWA/COWS score of 0, have stable vitals signs, and no symptoms of withdrawal  11/12/2014: Goal met: No withdrawal symptoms reported at this time per medical chart.   5. Goal(s): Patient will demonstrate decreased signs of psychosis  * Met: No  * Target date: 3-5 days post admission date  * As evidenced by: Patient will demonstrate decreased frequency of AVH or return to baseline function  11/12/2014: Goal not met: Pt to take medication as prescribed to decrease psychosis to baseline.     Attendees: Patient:    Family:    Physician: Dr. Parke Poisson; Dr. Sabra Heck 11/12/2014 9:30  AM  Nursing: Franco Nones and Janann August, RN 11/12/2014 9:30 AM  Clinical Social Worker: Tilden Fossa,  [place] 11/12/2014 9:30 AM  Other: Peri Maris, LCSWA 11/12/2014 9:30 AM  Other: Lucinda Dell, [place] Sessions Liaison 11/12/2014 9:30 AM  Other:  11/12/2014 9:30 AM  Other:  Ave Filter, NP 11/12/2014 9:30 AM  Other:    Other:    Other:    Other:    Other:      Scribe for Treatment Team:  Tilden Fossa, MSW, [place] [phone]

## 2014-11-12 NOTE — Progress Notes (Signed)
Pt attended the AA speaker meeting. 

## 2014-11-12 NOTE — Plan of Care (Signed)
Problem: Diagnosis: Increased Risk For Suicide Attempt Goal: STG-Patient Will Comply With Medication Regime Outcome: Progressing Patient taking his only scheduled medication, Potassium.

## 2014-11-12 NOTE — Progress Notes (Signed)
Patient ID: Dale [name], male   DOB: 07-24-1965, 49 y.o.   MRN: [id number]  DAR: Pt. Endorses SI/HI and auditory hallucinations. Patient denies any plan and reports he is not HI towards any particular person. He reports his auditory hallucinations are command in nature but he is able to contract for safety. He denies visual hallucinations. He reports his sleep last night was poor, appetite is fair, energy level is low, and concentration level is poor. He reports his depression and anxiety 10/10 and hopelessness 9/10. He reports pain in legs that is chronic. MD Dub Mikes notified of patient's pain. Support and encouragement provided to the patient. Patient remains somewhat irritable and guarded however will allow staff to speak with him briefly and will answer some questions. He remains isolative from peers, only seen in the milieu briefly to use the phone or go to meals. Patient does visibly show discomfort when walking however refused to use a wheelchair when offered. Scheduled medications administered to patient per physician's orders. Q15 minute checks are maintained for safety.

## 2014-11-12 NOTE — BHH Group Notes (Signed)
[facility] Group Notes: (Nursing/MHT/Case Management/Adjunct)  Date: 11/12/2014  Time: 10:24 AM  Type of Therapy: Group Therapy  Participation Level: Did Not Attend  Participation Quality: None  Affect: None  Cognitive: None  Insight: None  Engagement in Group: None-Did not attend  Modes of Intervention: Discussion and education  Summary of Progress/Problems: Patient invited to group and chose not to attend.  Wojciech Willetts V Khayden Herzberg 11/12/2014, 10:24 AM 

## 2014-11-12 NOTE — Progress Notes (Signed)
Recreation Therapy Notes  Animal-Assisted Activity (AAA) Program Checklist/Progress Notes Patient Eligibility Criteria Checklist & Daily Group note for Rec Tx Intervention  Date: 08.09.16 Time: 2:45 pm Location: [address] Dayroom  AAA/T Program Assumption of Risk Form signed by Patient/ or Parent Legal Guardian yes  Patient is free of allergies or sever asthma yes  Patient reports no fear of animals yes  Patient reports no history of cruelty to animalsyes  Patient understands his/her participation is voluntary yes  Patient washes hands before animal contact yes  Patient washes hands after animal contact yes  Education: Hand Washing, Appropriate Animal Interaction   Education Outcome: Acknowledges understanding/In group clarification offered/Needs additional education.   Clinical Observations/Feedback: Patient did not attend group.   Dale [name], LRT/CTRS         Dale [name] 11/12/2014 4:31 PM 

## 2014-11-13 LAB — PROTIME-INR
INR: 1.07 (ref 0.00–1.49)
Prothrombin Time: 14.1 seconds (ref 11.6–15.2)

## 2014-11-13 MED ORDER — WARFARIN SODIUM 7.5 MG PO TABS
15.0000 mg | ORAL_TABLET | Freq: Once | ORAL | Status: AC
  Administered 2014-11-13: 15 mg via ORAL
  Filled 2014-11-13: qty 2

## 2014-11-13 MED ORDER — PRAZOSIN HCL 2 MG PO CAPS
2.0000 mg | ORAL_CAPSULE | Freq: Every day | ORAL | Status: DC
  Administered 2014-11-13: 2 mg via ORAL
  Filled 2014-11-13: qty 1
  Filled 2014-11-13: qty 2
  Filled 2014-11-13 (×2): qty 1

## 2014-11-13 MED ORDER — OLANZAPINE 5 MG PO TABS
5.0000 mg | ORAL_TABLET | Freq: Three times a day (TID) | ORAL | Status: DC
  Administered 2014-11-13 – 2014-11-16 (×9): 5 mg via ORAL
  Filled 2014-11-13 (×10): qty 1
  Filled 2014-11-13: qty 2
  Filled 2014-11-13 (×4): qty 1

## 2014-11-13 NOTE — Progress Notes (Signed)
Pt attended NA speaker meeting. However, pt was inattentive and drowsy.
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
## 2014-11-13 NOTE — Progress Notes (Signed)
Patient ID: Dale [name], male   DOB: Oct 23, 1965, 49 y.o.   MRN: [id number]  DAR: Pt. Denies continues to endorse SI with no current plan, visual hallucinations of seeing "specks", and auditory hallucinations that patient reported in the afternoon increased. He reports sleep is fair, appetite is fair, energy level is low, and concentration is poor. He rates his depression, anxiety, and hopelessness at 10/10. Patient continues to report pain in legs that is chronic in nature and continues to receive PRN Tramadol. Support and encouragement provided to the patient. Scheduled medications administered to patient per physician's orders. Patient remains somewhat irritable however is less than yesterday and is more willing to speak with writer as the day goes on. Patient received PRN Zyprexa for agitation. He is seen in the milieu at times and is attending some groups. Q15 minute checks are maintained for safety.

## 2014-11-13 NOTE — Progress Notes (Signed)
D: Pt verbalizes severe depression, anxiety, and anger. Pt also states feeling of worthlessness, hopelessness and nervousness. Pt also verbalizes auditory hallucinations with passive SI; however, verbally contract for safety. Pt states, "My wife's father just died a few days and I am not at home to comfort her; I not even in the right state of mind to comfort her; I will not hurt myself; at list not now that my wife just lost her dad" Pt also c/o of severe R. leg pain of 10 on a 0-10 pain scale. Pt was very blunt during shift assessment with RN. Pt however denies HI.   A: Medications administered as prescribed.  Pt attended AA meeting. Support, encouragement, and safe environment provided.  15-minute safety checks continue. R: Pt was med compliant.  Safety checks continue.

## 2014-11-13 NOTE — Progress Notes (Signed)
ANTICOAGULATION CONSULT NOTE - Follow Up Consult  Pharmacy Consult for Coumadin Indication: DVT  Allergies  Allergen Reactions  . Ibuprofen Hives    Patient Measurements: Height:  (180.3 cm) Weight: 188 lb (85.276 kg) IBW/kg (Calculated) : 75.3   Labs:  Recent Labs  11/11/14 0121 11/11/14 1204 11/13/14 0630  HGB 16.7  --   --   HCT 47.5  --   --   PLT 210  --   --   LABPROT  --  14.7 14.1  INR  --  1.13 1.07  CREATININE 1.56*  --   --     Estimated Creatinine Clearance: 61.7 mL/min (by C-G formula based on Cr of 1.56).   Medications:  Scheduled:  . docusate sodium  100 mg Oral BID  . gabapentin  600 mg Oral QID  . pantoprazole  20 mg Oral BID AC  . simvastatin  20 mg Oral q1800  . traZODone  150 mg Oral QHS  . Warfarin - Pharmacist Dosing Inpatient   Does not apply q1800    Assessment: INR well below goal.  INR decreased after two 10 mg doses.  Goal of Therapy:  INR 2-3    Plan:  Coumadin 15 mg po x 1 today  PT/INR in am   Charyl Dancer 11/13/2014,8:15 AM

## 2014-11-13 NOTE — [facility] Group Notes (Signed)
   [facility] LCSW Aftercare Discharge Planning Group Note  11/13/2014  8:45 AM   Participation Quality: Alert, Appropriate and Oriented  Mood/Affect: Depressed; Agitated  Depression Rating: 10  Anxiety Rating: 10  Thoughts of Suicide: Pt endorses SI  Will you contract for safety? Yes  Current AVH: Pt endorses AH  Plan for Discharge/Comments: Pt attended discharge planning group and actively participated in group. CSW provided pt with today's workbook. Patient reports feeling "bad" today and continues to experience pain. He endorses SI, AH, seeing spots/dots, feeling nervous and paranoid.  Transportation Means: CSW continuing to assess  Supports: No supports mentioned at this time  Samuella Bruin, MSW, [employer] Clinical Social Worker [employer] [phone]

## 2014-11-13 NOTE — Plan of Care (Signed)
Problem: Alteration in mood Goal: STG-Patient is able to discuss feelings and issues (Patient is able to discuss feelings and issues leading to depression)  Outcome: Progressing Patient came to writer and stated he was having auditory hallucinations. Writer was able to speak to him and he agreed to taking PRN Zyprexa.

## 2014-11-13 NOTE — Progress Notes (Signed)
Recreation Therapy Notes  Date: 08.10.16 Time: 930 am Location: [address] Hall Group Room  Group Topic: Stress Management  Goal Area(s) Addresses:  Patient will verbalize importance of using healthy stress management.  Patient will identify positive emotions associated with healthy stress management.   Intervention: Stress Management  Activity : Progressive Muscle Relaxation. LRT will introduce and instruct patients on the stress management technique of progressive muscle relaxation. Patients were asked to follow a long with a script read a loud by LRT to participate in the stress management technique of progressive muscle relaxation.  Education: Stress Management, Discharge Planning.   Education Outcome: Acknowledges edcuation/In group clarification offered/Needs additional education  Clinical Observations/Feedback: Patient did not attend group.    Ceairra Mccarver, LRT/CTRS         Adorian Gwynne A 11/13/2014 3:52 PM 

## 2014-11-13 NOTE — Clinical Social Work Note (Signed)
[facility] referral made at MD's request.   Samuella Bruin, MSW, [facility] Clinical Social Worker [facility] [phone]

## 2014-11-14 LAB — PROTIME-INR
INR: 1.43 (ref 0.00–1.49)
Prothrombin Time: 17.5 seconds — ABNORMAL HIGH (ref 11.6–15.2)

## 2014-11-14 MED ORDER — WARFARIN SODIUM 10 MG PO TABS
10.0000 mg | ORAL_TABLET | Freq: Once | ORAL | Status: AC
  Administered 2014-11-14: 10 mg via ORAL
  Filled 2014-11-14: qty 1

## 2014-11-14 MED ORDER — PRAZOSIN HCL 1 MG PO CAPS
1.0000 mg | ORAL_CAPSULE | Freq: Every day | ORAL | Status: DC
  Filled 2014-11-14: qty 1

## 2014-11-14 NOTE — [facility] Group Notes (Signed)
LATE ENTRY FROM 8/10:  [facility] LCSW Group Therapy 11/13/14  1:15 PM Type of Therapy: Group Therapy Participation Level: Minimal  Participation Quality: Poor  Affect: Depressed and Flat  Cognitive: Alert and Oriented  Insight: Developing/Improving and Engaged  Engagement in Therapy: Developing/Improving and Engaged  Modes of Intervention: Clarification, Confrontation, Discussion, Education, Exploration, Limit-setting, Orientation, Problem-solving, Rapport Building, Dance movement psychotherapist, Socialization and Support  Summary of Progress/Problems: The topic for group today was emotional regulation. This group focused on both positive and negative emotion identification and allowed group members to process ways to identify feelings, regulate negative emotions, and find healthy ways to manage internal/external emotions. Group members were asked to reflect on a time when their reaction to an emotion led to a negative outcome and explored how alternative responses using emotion regulation would have benefited them. Group members were also asked to discuss a time when emotion regulation was utilized when a negative emotion was experienced. Patient participated minimally in discussion despite CSW encouragement. Patient left group early and was observed in the hallway, stating that he was having bad thoughts. CSW informed NT who got nursing staff involved.  Samuella Bruin, MSW, [employer] Clinical Social Worker [facility] [phone]

## 2014-11-14 NOTE — [facility] Group Notes (Signed)
[facility] LCSW Group Therapy 11/14/2014  1:15 PM   Type of Therapy: Group Therapy  Participation Level: Did Not Participate. Patient observed sleeping though group.   Samuella Bruin, MSW, [employer] Clinical Social Worker [facility] [phone]
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
## 2014-11-14 NOTE — Clinical Social Work Note (Signed)
Per [facility] Admissions Coordinator [place], no bed availability expected until possibly Monday August 15th.   Samuella Bruin, MSW, [employer] Clinical Social Worker [facility] [phone]

## 2014-11-14 NOTE — Progress Notes (Signed)
D: Pt who is withdrawn to self in the dayroom continues to be very angry and irritable. Pt refused to have any conversations with the RN; he states, "its same old story; I am not feeling any better." Pt expressed a facial pain of 10 using the Wong-Baker FACES Pain Rating Scale (hurts worst). Pt would not answer questions on HI and AVH, however state I am not going to hurt myself. Pt continues to be nonviolent.  A: Medications administered as prescribed.  Pt stayed in the dayroom for the NA meeting. Support, encouragement, and safe environment provided.  15-minute safety checks continue. R: Pt was med compliant.  Safety checks continue.

## 2014-11-14 NOTE — Progress Notes (Signed)
Patient attended karaoke group and participated.  

## 2014-11-14 NOTE — Progress Notes (Signed)
ANTICOAGULATION CONSULT NOTE - Follow Up Consult  Pharmacy Consult for Coumadin Indication:h/o DVT  Allergies  Allergen Reactions  . Ibuprofen Hives    Patient Measurements: Height:  (180.3 cm) Weight: 188 lb (85.276 kg) IBW/kg (Calculated) : 75.3  Vital Signs: Temp: 98.2 F (36.8 C) (08/11 0835) BP: 78/38 mmHg (08/11 0836) Pulse Rate: 100 (08/11 0836)  Labs:  Recent Labs  11/11/14 1204 11/13/14 0630 11/14/14 0626  LABPROT 14.7 14.1 17.5*  INR 1.13 1.07 1.43    Estimated Creatinine Clearance: 61.7 mL/min (by C-G formula based on Cr of 1.56).   Medications:  Scheduled:  . docusate sodium  100 mg Oral BID  . gabapentin  600 mg Oral QID  . OLANZapine  5 mg Oral TID PC  . pantoprazole  20 mg Oral BID AC  . prazosin  2 mg Oral QHS  . simvastatin  20 mg Oral q1800  . traZODone  150 mg Oral QHS  . Warfarin - Pharmacist Dosing Inpatient   Does not apply q1800    Assessment: INR increased to 1.43 today.  No problems with therapy noted. Goal of Therapy:  INR 2-3    Plan:  Coumadin 10 mg x 1 today PT/INR in am    Charyl Dancer 11/14/2014,11:37 AM
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
## 2014-11-15 ENCOUNTER — Encounter (HOSPITAL_COMMUNITY): Payer: Self-pay

## 2014-11-15 LAB — PROTIME-INR
INR: 1.49 (ref 0.00–1.49)
Prothrombin Time: 18.1 seconds — ABNORMAL HIGH (ref 11.6–15.2)

## 2014-11-15 MED ORDER — WARFARIN SODIUM 7.5 MG PO TABS
15.0000 mg | ORAL_TABLET | Freq: Once | ORAL | Status: AC
Start: 2014-11-15 — End: 2014-11-15
  Administered 2014-11-15: 15 mg via ORAL
  Filled 2014-11-15: qty 2

## 2014-11-15 MED ORDER — TOPIRAMATE 25 MG PO TABS
25.0000 mg | ORAL_TABLET | Freq: Every day | ORAL | Status: DC
  Administered 2014-11-15: 25 mg via ORAL
  Filled 2014-11-15 (×4): qty 1

## 2014-11-15 MED ORDER — PHENYLEPH-SHARK LIV OIL-MO-PET 0.25-3-14-71.9 % RE OINT
TOPICAL_OINTMENT | Freq: Two times a day (BID) | RECTAL | Status: DC | PRN
  Administered 2014-11-19 – 2014-11-26 (×3): via RECTAL
  Administered 2014-11-27: 1 via RECTAL
  Filled 2014-11-15 (×4): qty 28.4
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
## 2014-11-15 NOTE — [facility] Group Notes (Signed)
[facility] LCSW Group Therapy 11/15/2014  1:15 PM   Type of Therapy: Group Therapy  Participation Level: Did Not Attend. Patient invited to participate but declined.   Anasophia Pecor, MSW, LCSWA Clinical Social Worker [facility] [phone]    

## 2014-11-15 NOTE — Progress Notes (Signed)
ANTICOAGULATION CONSULT NOTE - Follow Up Consult  Pharmacy Consult for Coumadin Indication:h/o DVT  Allergies  Allergen Reactions  . Ibuprofen Hives   Patient Measurements: Height: 5\' 11"  (180.3 cm) Weight: 188 lb (85.276 kg) IBW/kg (Calculated) : 75.3  Vital Signs: Temp: 99.6 F (37.6 C) (08/12 0618) Temp Source: Oral (08/12 0618) BP: 90/59 mmHg (08/12 0620) Pulse Rate: 107 (08/12 0620)  Labs:  Recent Labs  11/13/14 0630 11/14/14 0626 11/15/14 0630  LABPROT 14.1 17.5* 18.1*  INR 1.07 1.43 1.49   Estimated Creatinine Clearance: 61.7 mL/min (by C-G formula based on Cr of 1.56).  Medications:  Scheduled:  . docusate sodium  100 mg Oral BID  . gabapentin  600 mg Oral QID  . OLANZapine  5 mg Oral TID PC  . pantoprazole  20 mg Oral BID AC  . simvastatin  20 mg Oral q1800  . traZODone  150 mg Oral QHS  . warfarin  15 mg Oral ONCE-1800  . 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 Home dose 10mg  daily exc 5mg  on Tu,Sat with last dose unknown. Admit INR low at 1.13, likely non-compliant.   Warfarin from 8/8: 10,10,15,10 mg  INR today 1.49   Goal of Therapy:  INR 2-3    Plan:  Warfarin 15mg  today at 1800  Daily PT/INR from 8/13  Otho Bellows PharmD Pager [phone] 11/15/2014, 8:57 AM

## 2014-11-15 NOTE — Progress Notes (Signed)
D: Patient is up in the milieu.  He is attending groups with minimal participation.  His goal today is "trying to do what's right."  Patient rates his depression, hopelessness and anxiety as an 10.  He remains passively suicidal without a specific plan.  He has minimal interaction with staff and peers. A: Continue to monitor medication management and MD orders.  Safety checks completed every 15 minutes per protocol. Offer support and encouragement as needed. R: Patient's behavior is appropriate to situation.

## 2014-11-15 NOTE — [facility] Group Notes (Signed)
[facility] LCSW Group Therapy 11/15/2014  1:15 PM   Type of Therapy: Group Therapy  Participation Level: Did Not Attend. Patient invited to participate but declined.   Janesa Dockery, MSW, LCSWA Clinical Social Worker [facility] [phone]    

## 2014-11-15 NOTE — Progress Notes (Signed)
Recreation Therapy Notes  Date: 08.12.16 Time: 9:30 am Location: [address] Group Room  Group Topic: Stress Management  Goal Area(s) Addresses:  Patient will verbalize importance of using healthy stress management.  Patient will identify positive emotions associated with healthy stress management.   Intervention: Stress Management  Activity :  Guided Training and development officer.  LRT will introduce and educate the patients on the stress management technique of guided imagery.  A script was used to deliver the technique to the patients.  Patients were asked to follow the script read aloud by the LRT to engage in the stress management technique.  Education:  Stress Management, Discharge Planning.   Education Outcome: Acknowledges edcuation/In group clarification offered/Needs additional education  Clinical Observations/Feedback: Patient did not attend group.   Caroll Rancher, LRT/CTRS         Caroll Rancher A 11/15/2014 4:03 PM

## 2014-11-15 NOTE — Progress Notes (Signed)
Patient did not attend the evening speaker AA meeting. Pt was notified that group was beginning but remained in bed.   

## 2014-11-15 NOTE — Progress Notes (Signed)
D: Pt was quiet and lay down for part of the shift. However, pt attended hs group. Pt didn't engage the writer in conversation, pt appeared to be more interested in his peers. Pt voiced no questions or concerns.   A:  Support and encouragement was offered. 15 min checks continued for safety.  R: Pt remains safe.

## 2014-11-15 NOTE — Tx Team (Signed)
Interdisciplinary Treatment Plan Update (Adult) Date: 11/15/2014    Time Reviewed: 9:30 AM  Progress in Treatment: Attending groups: Minimally Participating in groups: No Taking medication as prescribed: Yes Tolerating medication: Yes Family/Significant other contact made: No, CSW assessing for appropriate contacts Patient understands diagnosis: Yes Discussing patient identified problems/goals with staff: Yes Medical problems stabilized or resolved: Yes Denies suicidal/homicidal ideation: Yes, patient continues to endorse SI Issues/concerns per patient self-inventory: Yes Other:  New problem(s) identified: N/A  Discharge Plan or Barriers: 8/9:  CSW continuing to assess, patient new to milieu. 8/12: MD recommending [facility], referral pending. No bed availability at this time.  Reason for Continuation of Hospitalization:  Depression Anxiety Medication Stabilization   Comments: N/A  Estimated length of stay: 2-3 days   Patient is a 49 year old African American male admitted for SI, HI, AH, and cocaine/ETOH detox. Patient is homeless in [place]. Patient will benefit from crisis stabilization, medication evaluation, group therapy, and psycho education in addition to case management for discharge planning. Patient and CSW reviewed pt's identified goals and treatment plan. Pt verbalized understanding and agreed to treatment plan.     Review of initial/current patient goals per problem list:  1. Goal(s): Patient will participate in aftercare plan   Met: No   Target date: 3-5 days post admission date   As evidenced by: Patient will participate within aftercare plan AEB aftercare provider and housing plan at discharge being identified.  8/9: Goal not met: CSW assessing for appropriate referrals for pt and will have follow up secured prior to d/c. 8/12: Referral pending at  [facility].    2. Goal (s): Patient will exhibit decreased depressive symptoms and suicidal  ideations.   Met: No   Target date: 3-5 days post admission date   As evidenced by: Patient will utilize self rating of depression at 3 or below and demonstrate decreased signs of depression or be deemed stable for discharge by MD.  8/9: Goal not met: Pt presents with flat affect and depressed mood.  Pt admitted with depression rating of 10.  Pt to show decreased sign of depression and a rating of 3 or less before d/c.   8/12: Patient continues to endorse high levels of depression and SI.   3. Goal(s): Patient will demonstrate decreased signs and symptoms of anxiety.   Met: No   Target date: 3-5 days post admission date   As evidenced by: Patient will utilize self rating of anxiety at 3 or below and demonstrated decreased signs of anxiety, or be deemed stable for discharge by MD  8/9: Goal not met: Pt presents with anxious mood and affect.  Pt admitted with anxiety rating of 10.  Pt to show decreased sign of anxiety and a rating of 3 or less before d/c. 8/12: Patient continues to endorse high levels of anxiety.   4. Goal(s): Patient will demonstrate decreased signs of withdrawal due to substance abuse   Met: Yes   Target date: 3-5 days post admission date   As evidenced by: Patient will produce a CIWA/COWS score of 0, have stable vitals signs, and no symptoms of withdrawal  8/9: Goal met: No withdrawal symptoms reported at this time per medical chart.   5. Goal(s): Patient will demonstrate decreased signs of psychosis  * Met: No  * Target date: 3-5 days post admission date  * As evidenced by: Patient will demonstrate decreased frequency of AVH or return to baseline function  8/9: Goal not met: Pt to take medication  as prescribed to decrease psychosis to baseline.  8/12: Goal not met: Pt to take medication as prescribed to decrease psychosis to baseline.      Attendees: Patient:    Family:    Physician: Dr. Parke Poisson; Dr. Sabra Heck 11/15/2014 9:30 AM  Nursing:  Mayra Neer, Vira Browns, Desma Paganini, RN 11/15/2014 9:30 AM  Clinical Social Worker: Tilden Fossa,  [place] 11/15/2014 9:30 AM  Other: Peri Maris, Heather Smart, LCSWA 11/15/2014 9:30 AM  Other: Lucinda Dell, Beverly Sessions Liaison 11/15/2014 9:30 AM  Other:  11/15/2014 9:30 AM  Other: Ave Filter, NP 11/15/2014 9:30 AM  Other:    Other:    Other:       Scribe for Treatment Team:  Tilden Fossa, MSW, [place] [phone]

## 2014-11-15 NOTE — [facility] Group Notes (Signed)
[facility] Group Notes:  (Nursing/MHT/Case Management/Adjunct)  Date:   11/14/2014 LATE ENTRY Time:  0900  Type of Therapy:  Nurse Education  Participation Level:  Minimal  Participation Quality:  Attentive  Affect:  Lethargic  Cognitive:  Lacking  Insight:  Lacking  Engagement in Group:  Lacking  Modes of Intervention:  Discussion  Summary of Progress/Problems:  Dale [name] 11/15/2014, 9:59 AM

## 2014-11-16 DIAGNOSIS — F515 Nightmare disorder: Secondary | ICD-10-CM

## 2014-11-16 DIAGNOSIS — R443 Hallucinations, unspecified: Secondary | ICD-10-CM

## 2014-11-16 LAB — PROTIME-INR
INR: 1.44 (ref 0.00–1.49)
PROTHROMBIN TIME: 17.7 s — AB (ref 11.6–15.2)

## 2014-11-16 MED ORDER — MIRTAZAPINE 7.5 MG PO TABS
7.5000 mg | ORAL_TABLET | Freq: Every day | ORAL | Status: DC
  Administered 2014-11-16: 7.5 mg via ORAL
  Filled 2014-11-16 (×2): qty 1

## 2014-11-16 MED ORDER — TOPIRAMATE 25 MG PO TABS
50.0000 mg | ORAL_TABLET | Freq: Every day | ORAL | Status: DC
  Administered 2014-11-16 – 2014-11-17 (×2): 50 mg via ORAL
  Filled 2014-11-16 (×5): qty 2

## 2014-11-16 MED ORDER — OLANZAPINE 10 MG PO TABS
10.0000 mg | ORAL_TABLET | Freq: Every day | ORAL | Status: DC
  Administered 2014-11-16 – 2014-11-27 (×12): 10 mg via ORAL
  Filled 2014-11-16 (×14): qty 1

## 2014-11-16 MED ORDER — WARFARIN SODIUM 10 MG PO TABS
15.0000 mg | ORAL_TABLET | Freq: Once | ORAL | Status: AC
  Administered 2014-11-16: 15 mg via ORAL
  Filled 2014-11-16: qty 1.5

## 2014-11-16 MED ORDER — OLANZAPINE 5 MG PO TABS
5.0000 mg | ORAL_TABLET | Freq: Two times a day (BID) | ORAL | Status: DC
  Administered 2014-11-17 – 2014-11-24 (×15): 5 mg via ORAL
  Filled 2014-11-16 (×19): qty 1

## 2014-11-16 NOTE — [facility] Group Notes (Signed)
[facility] Group Notes:  (Nursing/MHT/Case Management/Adjunct)  Date:  11/16/2014  Time:  0900 am  Type of Therapy:  Psychoeducational Skills  Participation Level:  Did Not Attend Patient invited; patient elected to stay in bed.  [place] Mon 11/16/2014, 10:48 AM
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
## 2014-11-16 NOTE — Plan of Care (Signed)
Problem: Ineffective individual coping Goal: STG: Patient will remain free from self harm Outcome: Progressing Patient contracts for safety.

## 2014-11-16 NOTE — Progress Notes (Signed)
ANTICOAGULATION CONSULT NOTE - Follow Up Consult   Pharmacy Consult for Coumadin Indication: DVT  Allergies  Allergen Reactions  . Ibuprofen Hives    Patient Measurements: Height: 5\' 11"  (180.3 cm) Weight: 188 lb (85.276 kg) IBW/kg (Calculated) : 75.3 Heparin Dosing Weight:   Vital Signs: Temp: 98.8 F (37.1 C) (08/13 0700) Temp Source: Oral (08/13 0700) BP: 109/77 mmHg (08/13 0754) Pulse Rate: 92 (08/13 0754)  Labs:  Recent Labs  11/14/14 0626 11/15/14 0630 11/16/14 0630  LABPROT 17.5* 18.1* 17.7*  INR 1.43 1.49 1.44    Estimated Creatinine Clearance: 61.7 mL/min (by C-G formula based on Cr of 1.56).   Medications:  Scheduled:  . docusate sodium  100 mg Oral BID  . gabapentin  600 mg Oral QID  . OLANZapine  5 mg Oral TID PC  . pantoprazole  20 mg Oral BID AC  . simvastatin  20 mg Oral q1800  . topiramate  25 mg Oral QHS  . traZODone  150 mg Oral QHS  . warfarin  15 mg Oral ONCE-1800  . 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 Home dose 10mg  daily except 5mg  TuSat with last dose unknown. Admit INR low at 1.13, likely non-compliant.  Warfarin from 8/8: 10,10,15,10,15  INR = 1.44 today Goal of Therapy:  INR 2-3    Plan:   Warfarin 15mg  today at 1800  Follow up on daily PT/INR on 8/14  Loletta Specter 11/16/2014,1:14 PM

## 2014-11-16 NOTE — Progress Notes (Signed)
[facility] Group Notes:  (Nursing/MHT/Case Management/Adjunct)  Date:  11/16/2014  Time:  2045  Type of Therapy:  wrap up group  Participation Level:  Active  Participation Quality:  Appropriate, Attentive, Sharing and Supportive  Affect:  Appropriate  Cognitive:  Appropriate  Insight:  Improving  Engagement in Group:  Engaged  Modes of Intervention:  Clarification, Education and Support  Summary of Progress/Problems:  Dale [name] 11/16/2014, 9:55 PM

## 2014-11-16 NOTE — Progress Notes (Signed)
D: Patient has been visible in the milieu.  He denies any withdrawal symptoms.  He rates his depression, anxiety and hopelessness as a 10.  He continues to hear command hallucinations and reports passive SI.  He reports he is sleeping and eating well.  His affect is flat, blunted with depressed mood. A: Continue to monitor medication management and MD orders.  Safety checks completed every 15 minutes per protocol.  Offer support and encouragement as needed. R: Patient's behavior is appropriate to situation.

## 2014-11-16 NOTE — Plan of Care (Signed)
Problem: Diagnosis: Increased Risk For Suicide Attempt Goal: LTG-Patient Will Report Improved Mood and Deny Suicidal LTG (by discharge) Patient will report improved mood and deny suicidal ideation.  Outcome: Not Progressing Pt stated SI-contracts for safety Goal: LTG-Patient Will Report Improvement in Psychotic Symptoms LTG (by discharge) : Patient will report improvement in psychotic symptoms.  Outcome: Progressing Pt denies AVH at this time

## 2014-11-16 NOTE — Progress Notes (Signed)
D: Pt passive SI-contracts for safety.  denies HI/AVH. Pt is a little irritable, but cooperative. Pt brightens up during conversation. Pt seen interacting with peers in dayroom watching football.   A: Pt was offered support and encouragement. Pt was given scheduled medications. Pt was encourage to attend groups. Q 15 minute checks were done for safety.   R:Pt attends groups and interacts well with peers and staff. Pt is taking medication. Pt has no complaints at this time .Pt receptive to treatment and safety maintained on unit.

## 2014-11-16 NOTE — [facility] Group Notes (Signed)
[facility] Group Notes: (Clinical Social Work)   11/16/2014      Type of Therapy:  Group Therapy   Participation Level:  Did Not Attend despite MHT prompting   Ambrose Mantle, LCSW 11/16/2014, 11:57 AM

## 2014-11-17 DIAGNOSIS — R51 Headache: Secondary | ICD-10-CM

## 2014-11-17 LAB — PROTIME-INR
INR: 2.37 — AB (ref 0.00–1.49)
Prothrombin Time: 25.6 seconds — ABNORMAL HIGH (ref 11.6–15.2)

## 2014-11-17 MED ORDER — WARFARIN SODIUM 5 MG PO TABS
5.0000 mg | ORAL_TABLET | Freq: Once | ORAL | Status: AC
  Administered 2014-11-17: 5 mg via ORAL
  Filled 2014-11-17: qty 1

## 2014-11-17 MED ORDER — PRAZOSIN HCL 1 MG PO CAPS
1.0000 mg | ORAL_CAPSULE | Freq: Every day | ORAL | Status: DC
  Administered 2014-11-17 – 2014-11-20 (×4): 1 mg via ORAL
  Filled 2014-11-17 (×5): qty 1

## 2014-11-17 MED ORDER — TRAZODONE HCL 100 MG PO TABS
200.0000 mg | ORAL_TABLET | Freq: Every day | ORAL | Status: DC
  Administered 2014-11-17 – 2014-11-19 (×3): 200 mg via ORAL
  Filled 2014-11-17 (×5): qty 2

## 2014-11-17 MED ORDER — SUMATRIPTAN SUCCINATE 50 MG PO TABS
50.0000 mg | ORAL_TABLET | ORAL | Status: DC | PRN
  Administered 2014-11-17 – 2014-11-25 (×3): 50 mg via ORAL
  Filled 2014-11-17 (×3): qty 1
  Filled 2014-11-17: qty 2
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
## 2014-11-17 NOTE — Progress Notes (Signed)
ANTICOAGULATION CONSULT NOTE - Follow Up Consult  Pharmacy Consult for coumadin Indication: DVT  Allergies  Allergen Reactions  . Ibuprofen Hives    Patient Measurements: Height:  (180.3 cm) Weight: 188 lb (85.276 kg) IBW/kg (Calculated) : 75.3 Heparin Dosing Weight:   Vital Signs: Temp: 98.2 F (36.8 C) (08/14 0540) BP: 130/78 mmHg (08/14 0541) Pulse Rate: 96 (08/14 0541)  Labs:  Recent Labs  11/15/14 0630 11/16/14 0630 11/17/14 0558  LABPROT 18.1* 17.7* 25.6*  INR 1.49 1.44 2.37*    Estimated Creatinine Clearance: 61.7 mL/min (by C-G formula based on Cr of 1.56).   Medications:  Scheduled:  . docusate sodium  100 mg Oral BID  . gabapentin  600 mg Oral QID  . OLANZapine  10 mg Oral QHS  . OLANZapine  5 mg Oral BID  . pantoprazole  20 mg Oral BID AC  . prazosin  1 mg Oral QHS  . simvastatin  20 mg Oral q1800  . topiramate  50 mg Oral QHS  . traZODone  200 mg Oral QHS  . warfarin  5 mg Oral ONCE-1800  . 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 Home dose  daily except  TuSat with last dose unknown. Admit INR low at 1.13, likely non-compliant.  Warfarin from 8/8: 10,10,15,10,15,15  INR=2.37  Now in goal range Goal of Therapy:  INR 2-3  Plan:   Warfarin  today at 1800  Follow up on PT/INR in am plus CBC  Loletta Specter 11/17/2014,1:37 PM

## 2014-11-17 NOTE — Progress Notes (Signed)
Patient did attend the evening speaker AA meeting.  

## 2014-11-17 NOTE — [facility] Group Notes (Signed)
[facility] Group Notes: (Clinical Social Work)   11/17/2014      Type of Therapy:  Group Therapy   Participation Level:  Did Not Attend despite MHT prompting   Amelio Brosky [place], LCSW 11/17/2014, 12:24 PM     

## 2014-11-17 NOTE — Progress Notes (Signed)
Patient ID: Dale [name], male   DOB: [birth year], 49 y.o.   MRN: [id number]   D: Pt has been very flat and depressed on the unit today, patient has also been very isolative. Pt was in the bed most of the day, he did not attend groups nor did he engage in treatment. Pt complained of a severe headache this morning, he was seen by Aggie NP new orders were noted. Pt was given Imitrex, and reported relief. Pt reported that his anxiety was a 10, his hopelessness was a 10, and his anxiety was a 10. Pt reported that his goal for today was to get right and to do what is right. Pt reported being negative HI, no AH/VH noted. Pt reported that he was positive SI, and was able to contract for safety. A: 15 min checks continued for patient safety. R: Pt safety maintained.

## 2014-11-18 LAB — CBC WITH DIFFERENTIAL/PLATELET
BASOS ABS: 0 10*3/uL (ref 0.0–0.1)
BASOS PCT: 0 % (ref 0–1)
EOS ABS: 0.4 10*3/uL (ref 0.0–0.7)
Eosinophils Relative: 8 % — ABNORMAL HIGH (ref 0–5)
HCT: 42.2 % (ref 39.0–52.0)
HEMOGLOBIN: 13.9 g/dL (ref 13.0–17.0)
Lymphocytes Relative: 35 % (ref 12–46)
Lymphs Abs: 1.6 10*3/uL (ref 0.7–4.0)
MCH: 29.8 pg (ref 26.0–34.0)
MCHC: 32.9 g/dL (ref 30.0–36.0)
MCV: 90.4 fL (ref 78.0–100.0)
Monocytes Absolute: 0.3 10*3/uL (ref 0.1–1.0)
Monocytes Relative: 8 % (ref 3–12)
NEUTROS PCT: 49 % (ref 43–77)
Neutro Abs: 2.2 10*3/uL (ref 1.7–7.7)
Platelets: 197 10*3/uL (ref 150–400)
RBC: 4.67 MIL/uL (ref 4.22–5.81)
RDW: 14 % (ref 11.5–15.5)
WBC: 4.5 10*3/uL (ref 4.0–10.5)

## 2014-11-18 LAB — PROTIME-INR
INR: 3.01 — AB (ref 0.00–1.49)
PROTHROMBIN TIME: 30.6 s — AB (ref 11.6–15.2)

## 2014-11-18 MED ORDER — CARBAMAZEPINE ER 100 MG PO TB12
100.0000 mg | ORAL_TABLET | Freq: Two times a day (BID) | ORAL | Status: DC
  Administered 2014-11-18 – 2014-11-22 (×8): 100 mg via ORAL
  Filled 2014-11-18 (×11): qty 1

## 2014-11-18 NOTE — [facility] Counselor (Signed)
[facility] LCSW Group Therapy 11/18/2014  1:15 PM   Type of Therapy: Group Therapy  Participation Level: Did Not Attend. Patient invited to participate but declined.   Samuella Bruin, MSW, [employer] Clinical Social Worker [facility] [phone]

## 2014-11-18 NOTE — Progress Notes (Signed)
ANTICOAGULATION CONSULT NOTE - Follow Up Consult  Pharmacy Consult for Coumadin Indication: DVT  Allergies  Allergen Reactions  . Ibuprofen Hives    Patient Measurements: Height: 5\' 11"  (180.3 cm) Weight: 188 lb (85.276 kg) IBW/kg (Calculated) : 75.3 Heparin Dosing Weight:   Vital Signs: Temp: 98.6 F (37 C) (08/15 0600) BP: 125/63 mmHg (08/15 0601) Pulse Rate: 110 (08/15 0601)  Labs:  Recent Labs  11/16/14 0630 11/17/14 0558 11/18/14 0625  HGB  --   --  13.9  HCT  --   --  42.2  PLT  --   --  197  LABPROT 17.7* 25.6* 30.6*  INR 1.44 2.37* 3.01*    Estimated Creatinine Clearance: 61.7 mL/min (by C-G formula based on Cr of 1.56).   Medications:  Scheduled:  . docusate sodium  100 mg Oral BID  . gabapentin  600 mg Oral QID  . OLANZapine  10 mg Oral QHS  . OLANZapine  5 mg Oral BID  . pantoprazole  20 mg Oral BID AC  . prazosin  1 mg Oral QHS  . simvastatin  20 mg Oral q1800  . topiramate  50 mg Oral QHS  . traZODone  200 mg Oral QHS  . 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 Home dose 10mg  daily except 5mg  TuSat with last dose unknown. Admit INR low at 1.13, likely non-compliant.  Warfarin from 8/8: 10,10,15,10,15,15,5  INR= 3.01 today (just supratherapeutic) Goal of Therapy:  INR 2-3    Plan:   Will hold warfarin today as there was a sharp increase in the INR from yesterday  Follow up on PT/INR in AM   Loletta Specter 11/18/2014,1:16 PM

## 2014-11-18 NOTE — [facility] Suicide Risk Assessment (Signed)
[facility] INPATIENT:  Family/Significant Other Suicide Prevention Education  Suicide Prevention Education:  Patient Refusal for Family/Significant Other Suicide Prevention Education: The patient Dale [name] has refused to provide written consent for family/significant other to be provided Family/Significant Other Suicide Prevention Education during admission and/or prior to discharge.  Physician notified. SPE reviewed with patient and brochure provided. Patient encouraged to return to hospital if having suicidal thoughts, patient verbalized his/her understanding and has no further questions at this time.   Aprille Sawhney, [place] 11/18/2014, 10:03 AM

## 2014-11-18 NOTE — Plan of Care (Signed)
Problem: Ineffective individual coping Goal: STG: Patient will remain free from self harm Outcome: Not Progressing Patient continues to report SI with no specific plan.  He contracts for safety.
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
## 2014-11-18 NOTE — Progress Notes (Signed)
Recreation Therapy Notes  Date: 08.15.2016 Time: 9:30am  Location: [address] Group Room   Group Topic: Stress Management  Goal Area(s) Addresses:  Patient will actively participate in stress management techniques presented during session.   Behavioral Response: Did not attend.   [name] Oswald Pott, LRT/CTRS        Teriah Muela L 11/18/2014 3:03 PM

## 2014-11-18 NOTE — Progress Notes (Signed)
D: Patient is staying in bed, declining to come up for his meds.  He reports severe depression, hopelessness and anxiety.  He continues to report passive SI with no specific plan.  He has minimal interaction with staff and peers.  He has not been attending group. A: Continue to monitor medication management and MD orders.  Safety checks completed every 15 minutes per protocol.  Offer support and encouragement as needed. R: Patient encouraged to go to groups and participate in his treatment.

## 2014-11-18 NOTE — [facility] Group Notes (Signed)
   [facility] LCSW Aftercare Discharge Planning Group Note  11/18/2014  8:45 AM   Participation Quality: Alert, Appropriate and Oriented  Mood/Affect: Depressed and Flat  Depression Rating: "High"  Anxiety Rating: "High"  Thoughts of Suicide: Pt endorses SI today  Will you contract for safety? Yes  Current AVH: Pt denies  Plan for Discharge/Comments: Pt attended discharge planning group and actively participated in group. CSW provided pt with today's workbook. Patient reports experiencing AH and SI at this time but contracts for safety. [facility] referral is pending.  Transportation Means: CSW continuing to assess.  Supports: No supports mentioned at this time  Samuella Bruin, MSW, [employer] Clinical Social Worker [facility] [phone]

## 2014-11-18 NOTE — [facility] Group Notes (Signed)
Adult Psychoeducational Group Note  Date:  11/18/2014 Time:  9:37 PM  Group Topic/Focus:  AA Meeting  Participation Level:  None  Participation Quality:  Attentive  Affect:  Flat  Cognitive:  Alert  Insight: None  Engagement in Group:  None  Modes of Intervention:  Discussion  Additional Comments:  Patient attended group and was attentive.  Caroll Rancher A 11/18/2014, 9:37 PM

## 2014-11-18 NOTE — Clinical Social Work Note (Signed)
No [facility] beds available at this time.   Samuella Bruin, MSW, [employer] Clinical Social Worker [facility] [phone]

## 2014-11-19 LAB — PROTIME-INR
INR: 2.24 — AB (ref 0.00–1.49)
PROTHROMBIN TIME: 24.6 s — AB (ref 11.6–15.2)

## 2014-11-19 MED ORDER — WARFARIN SODIUM 7.5 MG PO TABS
7.5000 mg | ORAL_TABLET | Freq: Once | ORAL | Status: AC
  Administered 2014-11-19: 7.5 mg via ORAL
  Filled 2014-11-19: qty 1

## 2014-11-19 NOTE — Tx Team (Signed)
Interdisciplinary Treatment Plan Update (Adult) Date: 11/19/2014    Time Reviewed: 9:30 AM  Progress in Treatment: Attending groups: Minimally Participating in groups: No Taking medication as prescribed: Yes Tolerating medication: Yes Family/Significant other contact made: No, patient has declined collateral contacts Patient understands diagnosis: Yes Discussing patient identified problems/goals with staff: Yes Medical problems stabilized or resolved: Yes Denies suicidal/homicidal ideation: Yes, patient continues to endorse SI Issues/concerns per patient self-inventory: Yes Other:  New problem(s) identified: N/A  Discharge Plan or Barriers: 8/9:  CSW continuing to assess, patient new to milieu. 8/12: MD recommending [facility], referral pending. No bed availability at this time. 8/16: [facility] referral pending.  Reason for Continuation of Hospitalization:  Depression Anxiety Medication Stabilization   Comments: N/A  Estimated length of stay: 2-3 days   Patient is a 49 year old African American male admitted for SI, HI, AH, and cocaine/ETOH detox. Patient is homeless in [place]. Patient will benefit from crisis stabilization, medication evaluation, group therapy, and psycho education in addition to case management for discharge planning. Patient and CSW reviewed pt's identified goals and treatment plan. Pt verbalized understanding and agreed to treatment plan.     Review of initial/current patient goals per problem list:  1. Goal(s): Patient will participate in aftercare plan   Met: No   Target date: 3-5 days post admission date   As evidenced by: Patient will participate within aftercare plan AEB aftercare provider and housing plan at discharge being identified.  8/9: Goal not met: CSW assessing for appropriate referrals for pt and will have follow up secured prior to d/c. 8/12: Referral pending at  [facility]. 8/16: Referral pending at [facility].    2. Goal (s): Patient  will exhibit decreased depressive symptoms and suicidal ideations.   Met: No   Target date: 3-5 days post admission date   As evidenced by: Patient will utilize self rating of depression at 3 or below and demonstrate decreased signs of depression or be deemed stable for discharge by MD.  8/9: Goal not met: Pt presents with flat affect and depressed mood.  Pt admitted with depression rating of 10.  Pt to show decreased sign of depression and a rating of 3 or less before d/c.   8/12: Patient continues to endorse high levels of depression and SI. 8/16: 8/12: Patient continues to endorse high levels of depression and SI.   3. Goal(s): Patient will demonstrate decreased signs and symptoms of anxiety.   Met: No   Target date: 3-5 days post admission date   As evidenced by: Patient will utilize self rating of anxiety at 3 or below and demonstrated decreased signs of anxiety, or be deemed stable for discharge by MD  8/9: Goal not met: Pt presents with anxious mood and affect.  Pt admitted with anxiety rating of 10.  Pt to show decreased sign of anxiety and a rating of 3 or less before d/c. 8/12: Patient continues to endorse high levels of anxiety. 8/16: Patient continues to endorse high levels of anxiety.   4. Goal(s): Patient will demonstrate decreased signs of withdrawal due to substance abuse   Met: Yes   Target date: 3-5 days post admission date   As evidenced by: Patient will produce a CIWA/COWS score of 0, have stable vitals signs, and no symptoms of withdrawal  8/9: Goal met: No withdrawal symptoms reported at this time per medical chart.   5. Goal(s): Patient will demonstrate decreased signs of psychosis  * Met: No  * Target date: 3-5  days post admission date  * As evidenced by: Patient will demonstrate decreased frequency of AVH or return to baseline function  8/9: Goal not met: Pt to take medication as prescribed to decrease psychosis to baseline.  8/12: Goal  not met: Pt to take medication as prescribed to decrease psychosis to baseline.  8/16: Goal not met: Pt to take medication as prescribed to decrease psychosis to baseline.      Attendees: Patient:    Family:    Physician: Dr. Parke Poisson; Dr. Sabra Heck 11/19/2014 9:30 AM  Nursing: Mayra Neer, Festus Aloe, [place], [place] RN 11/19/2014 9:30 AM  Clinical Social Worker: Erasmo Downer Oryn Casanova,  [place] 11/19/2014 9:30 AM  Other: Peri Maris, Heather Smart, LCSWA 11/19/2014 9:30 AM  Other: Debarah Crape, [facility] 11/19/2014 9:30 AM  Other:  11/19/2014 9:30 AM  Other: Ave Filter, NP 11/19/2014 9:30 AM  Other:    Other:    Other:       Scribe for Treatment Team:  Tilden Fossa, MSW, [place] [phone]

## 2014-11-19 NOTE — Progress Notes (Signed)
[facility] Group Notes:  (Nursing/MHT/Case Management/Adjunct)  Date:  11/19/2014  Time:  2100  Type of Therapy:  wrap up group  Participation Level:  Active  Participation Quality:  Appropriate, Attentive, Sharing and Supportive  Affect:  Appropriate  Cognitive:  Appropriate  Insight:  Good  Engagement in Group:  Engaged  Modes of Intervention:  Clarification, Education and Support  Summary of Progress/Problems:Pt shares that he has been struggling with suicidal thoughts and voices since 1985. Pt requires medicine that helps but doesn't prevent all the time. Pt shares that everything can be going really well for awhile and then he is "pulled back by the thoughts and voices". All he can do is get help when help is needed and stay away from drugs and alcohol because he realizes that the use makes them worse.   Shelah Lewandowsky 11/19/2014, 10:08 PM

## 2014-11-19 NOTE — Progress Notes (Addendum)
ANTICOAGULATION CONSULT NOTE - Follow Up Consult  Pharmacy Consult for Coumadin Indication: DVT  Allergies  Allergen Reactions  . Ibuprofen Hives    Patient Measurements: Height:  (180.3 cm) Weight: 188 lb (85.276 kg) IBW/kg (Calculated) : 75.3 Heparin Dosing Weight:   Vital Signs: Temp: 97.7 F (36.5 C) (08/16 0630) Temp Source: Oral (08/16 0630) BP: 126/102 mmHg (08/16 0631) Pulse Rate: 116 (08/16 0631)  Labs:  Recent Labs  11/17/14 0558 11/18/14 0625 11/19/14 0500  HGB  --  13.9  --   HCT  --  42.2  --   PLT  --  197  --   LABPROT 25.6* 30.6* 24.6*  INR 2.37* 3.01* 2.24*    Estimated Creatinine Clearance: 61.7 mL/min (by C-G formula based on Cr of 1.56).   Medications:  Scheduled:  . carbamazepine  100 mg Oral BID  . docusate sodium  100 mg Oral BID  . gabapentin  600 mg Oral QID  . OLANZapine  10 mg Oral QHS  . OLANZapine  5 mg Oral BID  . pantoprazole  20 mg Oral BID AC  . prazosin  1 mg Oral QHS  . simvastatin  20 mg Oral q1800  . traZODone  200 mg Oral QHS  . warfarin  7.5 mg Oral ONCE-1800  . 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 Home dose  daily except  TuSat with last dose unknown. Admit INR low at 1.13, likely non-compliant.  Warfarin from 8/8: 10,10,15,10,15,15,5, held  INR=2.24 Goal of Therapy:  INR 2-3    Plan:   The INR is within goal range after hold medication from yesterday. Now Tegretol  XR BID has been added to the medications and this may decrease the effect of Coumadin on the INR.  Will give 7.5mg  X1  today at 1800.  Follow up on PT/INR in AM. Now that INR is therapeutic, hope to be able to resume previous home dose soon.  Loletta Specter 11/19/2014,12:48 PM
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
## 2014-11-19 NOTE — [facility] Group Notes (Signed)
[facility] Group Notes:  (Nursing/MHT/Case Management/Adjunct)  Date:  11/19/2014  Time:  0900 am  Type of Therapy:  Psychoeducational Skills  Participation Level:  Did Not Attend  Patient invited; declined to attend.  Summary of Progress/Problems:  Dale [name] 11/19/2014, 9:41 AM

## 2014-11-19 NOTE — Progress Notes (Signed)
D: Patient continues to complain on generalized weakness.  He presents with flat, blunted affect and depressed mood.  Patient can be irritable at times.  He interacts minimally with staff and peers.  He is attending some groups, however, isolates to room during the day.  He rates his depression, anxiety and hopelessness as a 10.  He rates his pain as a 10 and has not requested any pain medication.   A: Continue to monitor medication management and MD orders.  Safety checks completed every 15 minutes per protocol.  Offer support and encouragement as needed. R: Patient's behavior is appropriate for situation.

## 2014-11-19 NOTE — Progress Notes (Signed)
Recreation Therapy Notes  Animal-Assisted Activity (AAA) Program Checklist/Progress Notes Patient Eligibility Criteria Checklist & Daily Group note for Rec Tx Intervention  Date: 08.16.2016 Time: 2:45pm Location: [address]    AAA/T Program Assumption of Risk Form signed by Patient/ or Parent Legal Guardian yes  Patient is free of allergies or sever asthma yes  Patient reports no fear of animals yes  Patient reports no history of cruelty to animals yes  Patient understands his/her participation is voluntary yes  Behavioral Response: Did not attend.    [name] Amaris Delafuente, LRT/CTRS        Jearl Klinefelter 11/19/2014 3:16 PM
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
## 2014-11-19 NOTE — Progress Notes (Signed)
D: Pt informed the writer that he was experiencing mood swings, with bouts of hot flashes. Pt also complained of hemorrhoids.  A: Discussed change in medications. Also informed of preparation H. Support and encouragement was offered. 15 min checks continued for safety.  R: Pt remains safe.

## 2014-11-20 LAB — PROTIME-INR
INR: 2.28 — AB (ref 0.00–1.49)
Prothrombin Time: 24.9 seconds — ABNORMAL HIGH (ref 11.6–15.2)

## 2014-11-20 LAB — LIPID PANEL
Cholesterol: 174 mg/dL (ref 0–200)
HDL: 48 mg/dL (ref >40)
LDL CALC: 55 mg/dL (ref 0–99)
Total CHOL/HDL Ratio: 3.6 RATIO
Triglycerides: 356 mg/dL — ABNORMAL HIGH (ref <150)
VLDL: 71 mg/dL — AB (ref 0–40)

## 2014-11-20 MED ORDER — GABAPENTIN 400 MG PO CAPS
800.0000 mg | ORAL_CAPSULE | Freq: Four times a day (QID) | ORAL | Status: DC
  Administered 2014-11-20 – 2014-11-27 (×30): 800 mg via ORAL
  Filled 2014-11-20 (×36): qty 2

## 2014-11-20 MED ORDER — TRAZODONE HCL 100 MG PO TABS
100.0000 mg | ORAL_TABLET | Freq: Every day | ORAL | Status: DC
  Administered 2014-11-20 – 2014-11-25 (×6): 100 mg via ORAL
  Filled 2014-11-20 (×8): qty 1

## 2014-11-20 MED ORDER — TRAMADOL HCL 50 MG PO TABS
100.0000 mg | ORAL_TABLET | Freq: Four times a day (QID) | ORAL | Status: DC | PRN
  Administered 2014-11-20 – 2014-11-26 (×11): 100 mg via ORAL
  Filled 2014-11-20 (×11): qty 2

## 2014-11-20 MED ORDER — WARFARIN SODIUM 7.5 MG PO TABS
7.5000 mg | ORAL_TABLET | Freq: Once | ORAL | Status: AC
  Administered 2014-11-20: 7.5 mg via ORAL
  Filled 2014-11-20: qty 1

## 2014-11-20 NOTE — Progress Notes (Signed)
Recreation Therapy Notes  Date: 08.17.2016 Time: 9:30am Location: [address] Group room   Group Topic: Stress Management  Goal Area(s) Addresses:  Patient will actively participate in stress management techniques presented during session.   Behavioral Response: Did not attend.   Jobe Mutch L Timika Muench, LRT/CTRS        Elly Haffey L 11/20/2014 3:03 PM 

## 2014-11-20 NOTE — Progress Notes (Signed)
ANTICOAGULATION CONSULT NOTE - Follow Up Consult  Pharmacy Consult for Coumadin Indication: DVT  Allergies  Allergen Reactions  . Ibuprofen Hives   Patient Measurements: Height: 5\' 11"  (180.3 cm) Weight: 188 lb (85.276 kg) IBW/kg (Calculated) : 75.3  Vital Signs: Temp: 97.8 F (36.6 C) (08/17 0640) BP: 106/76 mmHg (08/17 0641) Pulse Rate: 103 (08/17 0641)  Labs:  Recent Labs  11/18/14 0625 11/19/14 0500 11/20/14 0640  HGB 13.9  --   --   HCT 42.2  --   --   PLT 197  --   --   LABPROT 30.6* 24.6* 24.9*  INR 3.01* 2.24* 2.28*   Estimated Creatinine Clearance: 61.7 mL/min (by C-G formula based on Cr of 1.56).  Medications:  Scheduled:  . carbamazepine  100 mg Oral BID  . docusate sodium  100 mg Oral BID  . gabapentin  600 mg Oral QID  . OLANZapine  10 mg Oral QHS  . OLANZapine  5 mg Oral BID  . pantoprazole  20 mg Oral BID AC  . prazosin  1 mg Oral QHS  . simvastatin  20 mg Oral q1800  . traZODone  200 mg Oral QHS  . warfarin  7.5 mg Oral ONCE-1800  . 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 Home dose 10mg  daily except 5mg  TuSat with last dose unknown. Admit INR low at 1.13, likely non-compliant.  Warfarin from 8/8: 10,10,15,10,15,15,5, held, 7.5 mg  INR=2.28  Goal of Therapy:  INR 2-3   Plan:   The INR remains within goal range today. Now Tegretol 200mg  XR BID has been added to the medications and this may decrease the effect of Coumadin on the INR.  Will give 7.5mg  X1  today at 1800.  Follow up on PT/INR in AM. Now that INR is therapeutic, hope to be able to resume previous home dose soon.  Otho Bellows PharmD 11/20/2014,9:40 AM

## 2014-11-20 NOTE — [facility] Group Notes (Signed)
[facility] LCSW Group Therapy 11/20/2014  1:15 PM Type of Therapy: Group Therapy Participation Level: Minimal  Participation Quality: Appropriate  Affect: Lethargic  Cognitive: Alert and Oriented  Insight: Developing/Improving and Engaged  Engagement in Therapy: Developing/Improving and Engaged  Modes of Intervention: Clarification, Confrontation, Discussion, Education, Exploration, Limit-setting, Orientation, Problem-solving, Rapport Building, Dance movement psychotherapist, Socialization and Support  Summary of Progress/Problems: The topic for group today was emotional regulation. This group focused on both positive and negative emotion identification and allowed group members to process ways to identify feelings, regulate negative emotions, and find healthy ways to manage internal/external emotions. Group members were asked to reflect on a time when their reaction to an emotion led to a negative outcome and explored how alternative responses using emotion regulation would have benefited them. Group members were also asked to discuss a time when emotion regulation was utilized when a negative emotion was experienced. Patient observed sleeping throughout majority of discussion and participated minimally despite CSW encouragement.  Samuella Bruin, MSW, [employer] Clinical Social Worker [facility] [phone]

## 2014-11-20 NOTE — Plan of Care (Signed)
Problem: Diagnosis: Increased Risk For Suicide Attempt Goal: LTG-Patient Will Report Improved Mood and Deny Suicidal LTG (by discharge) Patient will report improved mood and deny suicidal ideation.  Outcome: Not Progressing Patient did repprot a change in mood stating he was able to laugh and interact with peers, however he continues to be passively suicidal.  Patient verbally contracts for safety." Goal: LTG-Patient Will Report Absence of Withdrawal Symptoms LTG (by discharge): Patient will report absence of withdrawal symptoms.  Outcome: Completed/Met Date Met:  11/20/14 Patient reports no withdrawal symptoms Goal: STG-Patient Will Report Suicidal Feelings to Staff Outcome: Progressing Patient verbally contracts for safety Goal: STG-Patient will be able to identify reason for suicidal STG-Patient will be able to identify reason for suicidal ideation  Outcome: Not Progressing Patient was not able to identify a reason.  He told Probation officer he was suicidal before she asked and when asked if there were any contributing factors patient shook his head no.

## 2014-11-20 NOTE — Progress Notes (Signed)
D: Pt informed the writer that he urinated in bed last night. Stated that he sometimes sees "small black dots floating when he opens his eyes".   A: Encouraged the Clinical research associate to inform his doctor. Also informed the pt that he's scheduled for an A1C in the morning.Support and encouragement was offered. 15 min checks continued for safety.  R: Pt remains safe.

## 2014-11-20 NOTE — Clinical Social Work Note (Signed)
No bed availability at this time at [facility] per admission coordinator. Expected bed availability unknown.   Samuella Bruin, MSW, [employer] Clinical Social Worker [facility] [phone]

## 2014-11-20 NOTE — Progress Notes (Signed)
Patient ID: Dale [name], male   DOB: 1966-03-29, 49 y.o.   MRN: [id number] D: Patient's affect is brighter; he's more talkative and pleasant.  He continues to rate all his symptoms a ten.  He denied SI with Child psychotherapist, however, put on his inventory that he is still having thoughts.  He is positive for command hallucinations and has "bad thoughts."  He is visible on the milieu today.  He is interacting better with staff and peers.  He continues to complain of right foot pain. A: Continue to monitor medication management and MD orders.  Safety checks completed every 15 minutes per protocol.  Offer support and encouragement. R: Patient's behavior is appropriate to situation.
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
## 2014-11-20 NOTE — Progress Notes (Signed)
Pt attended NA group this evening.  

## 2014-11-20 NOTE — BHH Group Notes (Signed)
   [facility] LCSW Aftercare Discharge Planning Group Note  11/20/2014  8:45 AM   Participation Quality: Alert, Appropriate and Oriented  Mood/Affect: Depressed and Flat  Depression Rating: 10  Anxiety Rating: 10  Thoughts of Suicide: Pt denies SI/HI  Will you contract for safety? Yes  Current AVH: Pt denies  Plan for Discharge/Comments: Pt attended discharge planning group and actively participated in group. CSW provided pt with today's workbook. Patient reports feeling "alright", reports that he did not sleep well last night due to [facility] and nightmares.   Transportation Means: Pt reports access to transportation  Supports: No supports mentioned at this time  Samuella Bruin, MSW, [employer] Clinical Social Worker [employer] [phone]

## 2014-11-20 NOTE — Progress Notes (Signed)
D: Patient in the dayroom on approach.  Patient states, "I had a good and bad day."  Patient states the good was he was able to maintain and laugh and interacting with peers.  Patient states the bad is he continues to have passive SI and auditory hallucinations.  Patient denies HI.  Patient verbally contracts for safety.  Patient rates his anxiety and depression both 7/10.  Patient states, "I did not have a goal for today." A: Staff to monitor Q 15 mins for safety.  Encouragement and support offered.  Scheduled medications administered per orders. Ultram administered prn for pain. R: Patient remains safe on the unit.  Patient attended group tonight.  Patient visible on the unit and interacting with peers.  Patient taking administered medications.

## 2014-11-20 NOTE — Clinical Social Work Note (Signed)
CSW left voicemail for [facility] regarding bed availability. Awaiting return call.  Samuella Bruin, MSW, [employer] Clinical Social Worker [facility] [phone]

## 2014-11-21 LAB — HEMOGLOBIN A1C
HEMOGLOBIN A1C: 6 % — AB (ref 4.8–5.6)
Mean Plasma Glucose: 126 mg/dL

## 2014-11-21 LAB — PROTIME-INR
INR: 2.87 — ABNORMAL HIGH (ref 0.00–1.49)
PROTHROMBIN TIME: 29.6 s — AB (ref 11.6–15.2)

## 2014-11-21 MED ORDER — WARFARIN SODIUM 5 MG PO TABS
5.0000 mg | ORAL_TABLET | Freq: Once | ORAL | Status: AC
  Administered 2014-11-21: 5 mg via ORAL
  Filled 2014-11-21: qty 1

## 2014-11-21 MED ORDER — GLUCERNA SHAKE PO LIQD
237.0000 mL | Freq: Three times a day (TID) | ORAL | Status: DC
  Administered 2014-11-21 – 2014-11-27 (×18): 237 mL via ORAL

## 2014-11-21 MED ORDER — PRAZOSIN HCL 2 MG PO CAPS
2.0000 mg | ORAL_CAPSULE | Freq: Every day | ORAL | Status: DC
  Administered 2014-11-21 – 2014-11-22 (×2): 2 mg via ORAL
  Filled 2014-11-21 (×3): qty 1

## 2014-11-21 NOTE — Progress Notes (Signed)
Nutrition Education Note  RD led a group providing general, healthful nutrition education.  RD emphasized the importance of eating regular meals and snacks throughout the day. Consuming sugar-free beverages and incorporating fruits and vegetables into diet when possible. Provided examples of healthy snacks. Encouraged physical activity for at least 60 minutes a day.   Expect good compliance.  Diet Order: Diet Heart Room service appropriate?: Yes; Fluid consistency:: Thin Pt is also offered choice of unit snacks mid-morning and mid-afternoon.  Pt is eating as desired.   Labs and medications reviewed. If additional nutrition issues arise, please consult RD.  Dale Franco, MS, RD, LDN Pager: [phone] After Hours Pager: [phone]
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
## 2014-11-21 NOTE — Progress Notes (Signed)
NUTRITION ASSESSMENT  Pt identified as at risk on the Malnutrition Screen Tool  INTERVENTION: 1. Educated patient on the importance of nutrition and encouraged intake of food and beverages. 2. Discussed weight goals. 3. Supplements: Glucerna Shake po TID, each supplement provides 220 kcal and 10 grams of protein  NUTRITION DIAGNOSIS: Unintentional weight loss related to sub-optimal intake as evidenced by pt report.   Goal: Pt to meet >/= 90% of their estimated nutrition needs.  Monitor:  PO intake  Assessment:  Pt admitted with bipolar disorder, cocaine use and ETOH use. Pt c/o frequent urination.   Pt with HgbA1c of 6.0. Pt with concerns over diet. Encouraged pt to avoid sugar-sweetened beverages and to limits sweets.  Per weight history, pt has lost 15 lb since 7/08 (7% weight loss x 1.5 months, significant for time frame). Pt would benefit from Glucerna nutritional supplement. RD to order.  Height: Ht Readings from Last 1 Encounters:  11/11/14  (1.803 m)    Weight: Wt Readings from Last 1 Encounters:  11/11/14 188 lb (85.276 kg)    Weight Hx: Wt Readings from Last 10 Encounters:  11/11/14 188 lb (85.276 kg)  11/11/14 190 lb (86.183 kg)  10/11/14 203 lb 3.2 oz (92.171 kg)  06/08/14 191 lb (86.637 kg)  05/01/14 200 lb (90.719 kg)  02/18/14 206 lb (93.441 kg)  02/14/14 195 lb (88.451 kg)  09/15/13 215 lb (97.523 kg)  06/14/13 212 lb 11.9 oz (96.5 kg)  05/09/13 217 lb (98.431 kg)    BMI:  Body mass index is 26.23 kg/(m^2). Pt meets criteria for overweight based on current BMI.  Estimated Nutritional Needs: Kcal: 25-30 kcal/kg Protein: > 1 gram protein/kg Fluid: 1 ml/kcal  Diet Order: Diet Heart Room service appropriate?: Yes; Fluid consistency:: Thin Pt is also offered choice of unit snacks mid-morning and mid-afternoon.  Pt is eating as desired.   Lab results and medications reviewed.   Tilda Franco, MS, RD, LDN Pager: [phone] After Hours  Pager: [phone]

## 2014-11-21 NOTE — Clinical Social Work Note (Signed)
No bed availability at [facility] at this time.  Samuella Bruin, MSW, [employer] Clinical Social Worker [facility] [phone]

## 2014-11-21 NOTE — [facility] Group Notes (Signed)
[facility] Group Notes:  (Nursing/MHT/Case Management/Adjunct)  Date:  11/21/2014  Time:  12:30 PM  Type of Therapy:  Nurse Education / Leisure Activities: The group is focused on teaching patients the importance of incorporating daily  leisure activities into our lives .  Participation Level:  Minimal  Participation Quality:  Drowsy  Affect:  Flat  Cognitive:  Lacking  Insight:  Lacking  Engagement in Group:  Lacking  Modes of Intervention:  Education  Summary of Progress/Problems:  Rich Brave 11/21/2014, 12:30 PM

## 2014-11-21 NOTE — Progress Notes (Signed)
D: Patient states he slept poorly last night.  He is visible in the milieu today.  He has brighter affect and is pleasant upon approach.  He continues to report passive SI and command AH.  Patient continues to have pain in his right foot.  His ultram has been increased for same.  He rates his depression, anxiety and hopelessness as a 10.  He denies any withdrawal symptoms.  He does complain of lightheadedness, dizziness and blurred vision. A: Continue to monitor medication management and MD orders.  Safety checks completed every 15 minutes per protocol.  Offer support and encouragement as needed. R: Patient's behavior is appropriate to situation.

## 2014-11-21 NOTE — Progress Notes (Signed)
Patient attended Karaoke group tonight.  

## 2014-11-21 NOTE — Progress Notes (Signed)
D: Patient in the dayroom interacting with peers.  Patient appears to be engaged in the card game.  Patient states conversation appears to be non congruent then what he states.  Patient states he cannot concentrate on anything because the voices are so loud.  Patient has been visualized playing cards with peers during free time and is engaged.  Patient states he has passive SI but verbally contracts for safety.  Patient denies HI and states he has auditory hallucinations that say derogatory things to him.  A: Staff to monitor Q 15 mins for safety.  Encouragement and support offered.  Scheduled medications administered per orders. R: Patient remains safe on the unit.  Patient attended group tonight.  Patient visible on the unit and interacting with peers.

## 2014-11-21 NOTE — [facility] Group Notes (Signed)
[facility] LCSW Group Therapy 11/21/2014 1:15 PM Type of Therapy: Group Therapy Participation Level: Minimal  Participation Quality: Minimal  Affect: Lethargic  Cognitive: Alert and Oriented  Insight: Developing/Improving and Engaged  Engagement in Therapy: Developing/Improving and Engaged  Modes of Intervention: Activity, Clarification, Confrontation, Discussion, Education, Exploration, Limit-setting, Orientation, Problem-solving, Rapport Building, Dance movement psychotherapist, Socialization and Support  Summary of Progress/Problems: Patient was attentive and engaged with speaker from [employer]. Patient was attentive to speaker while they shared their story of dealing with mental health and overcoming it. Patient expressed interest in their programs and services and received information on their agency. Patient processed ways they can relate to the speaker. Patient observed sleeping during presentation.  Samuella Bruin, MSW, [employer] Clinical Social Worker [facility] [phone]

## 2014-11-21 NOTE — Tx Team (Signed)
Interdisciplinary Treatment Plan Update (Adult) Date: 11/21/2014    Time Reviewed: 9:30 AM  Progress in Treatment: Attending groups: Minimally Participating in groups: Minimally Taking medication as prescribed: Yes Tolerating medication: Yes Family/Significant other contact made: No, patient has declined collateral contacts Patient understands diagnosis: Yes Discussing patient identified problems/goals with staff: Yes Medical problems stabilized or resolved: Yes Denies suicidal/homicidal ideation: Treatment team continuing to assess Issues/concerns per patient self-inventory: Yes Other:  New problem(s) identified: N/A  Discharge Plan or Barriers: 8/9:  CSW continuing to assess, patient new to milieu. 8/12: MD recommending [facility], referral pending. No bed availability at this time. 8/16: [facility] referral pending. 8/18: [facility] referral pending. No bed availability at this time.  Reason for Continuation of Hospitalization:  Depression Anxiety Medication Stabilization   Comments: N/A  Estimated length of stay: 2-3 days   Patient is a 49 year old African American male admitted for SI, HI, AH, and cocaine/ETOH detox. Patient is homeless in [place]. Patient will benefit from crisis stabilization, medication evaluation, group therapy, and psycho education in addition to case management for discharge planning. Patient and CSW reviewed pt's identified goals and treatment plan. Pt verbalized understanding and agreed to treatment plan.     Review of initial/current patient goals per problem list:  1. Goal(s): Patient will participate in aftercare plan   Met: No   Target date: 3-5 days post admission date   As evidenced by: Patient will participate within aftercare plan AEB aftercare provider and housing plan at discharge being identified.  8/9: Goal not met: CSW assessing for appropriate referrals for pt and will have follow up secured prior to d/c. 8/12: Referral pending  at  [place]. 8/16: Referral pending at [place]. 8/18: Referral pending at [place].    2. Goal (s): Patient will exhibit decreased depressive symptoms and suicidal ideations.   Met: Goal Progressing   Target date: 3-5 days post admission date   As evidenced by: Patient will utilize self rating of depression at 3 or below and demonstrate decreased signs of depression or be deemed stable for discharge by MD.  8/9: Goal not met: Pt presents with flat affect and depressed mood.  Pt admitted with depression rating of 10.  Pt to show decreased sign of depression and a rating of 3 or less before d/c.   8/12: Patient continues to endorse high levels of depression and SI. 8/16: Patient continues to endorse high levels of depression and SI. 8/18: Patient continues to endorse high levels of depression.   3. Goal(s): Patient will demonstrate decreased signs and symptoms of anxiety.   Met: Goal Progressing   Target date: 3-5 days post admission date   As evidenced by: Patient will utilize self rating of anxiety at 3 or below and demonstrated decreased signs of anxiety, or be deemed stable for discharge by MD  8/9: Goal not met: Pt presents with anxious mood and affect.  Pt admitted with anxiety rating of 10.  Pt to show decreased sign of anxiety and a rating of 3 or less before d/c. 8/12: Patient continues to endorse high levels of anxiety. 8/16: Patient continues to endorse high levels of anxiety. 8/18: Patient continues to endorse high levels of anxiety.   4. Goal(s): Patient will demonstrate decreased signs of withdrawal due to substance abuse   Met: Yes   Target date: 3-5 days post admission date   As evidenced by: Patient will produce a CIWA/COWS score of 0, have stable vitals signs, and no symptoms of withdrawal  8/9: Goal met: No withdrawal symptoms reported at this time per medical chart.   5. Goal(s): Patient will demonstrate decreased signs of psychosis  * Met: No  *  Target date: 3-5 days post admission date  * As evidenced by: Patient will demonstrate decreased frequency of AVH or return to baseline function  8/9: Goal not met: Pt to take medication as prescribed to decrease psychosis to baseline.  8/12: Goal not met: Pt to take medication as prescribed to decrease psychosis to baseline.  8/16: Goal not met: Pt to take medication as prescribed to decrease psychosis to baseline.  8/18: Goal not met: Pt to take medication as prescribed to decrease psychosis to baseline.     Attendees: Patient:    Family:    Physician: Dr. Parke Poisson; Dr. Sabra Heck 11/21/2014 9:30 AM  Nursing: Mayra Neer, Patty Duke, Fredda Hammed [facility] 11/21/2014 9:30 AM  Clinical Social Worker: Tilden Fossa,  [place] 11/21/2014 9:30 AM  Other: Peri Maris, [place], LCSWA 11/21/2014 9:30 AM  Other:  11/21/2014 9:30 AM  Other:  11/21/2014 9:30 AM  Other: Samuel Jester, NP 11/21/2014 9:30 AM  Other:    Other:    Other:       Scribe for Treatment Team:  Tilden Fossa, MSW, [place] [phone]

## 2014-11-21 NOTE — Progress Notes (Signed)
ANTICOAGULATION CONSULT NOTE - Follow Up Consult  Pharmacy Consult for Coumadin Indication: DVT  Allergies  Allergen Reactions  . Ibuprofen Hives    Patient Measurements: Height:  (180.3 cm) Weight: 188 lb (85.276 kg) IBW/kg (Calculated) : 75.3 Heparin Dosing Weight:   Vital Signs: Temp: 98.1 F (36.7 C) (08/18 0700) BP: 114/81 mmHg (08/18 0701) Pulse Rate: 100 (08/18 0701)  Labs:  Recent Labs  11/19/14 0500 11/20/14 0640 11/21/14 0626  LABPROT 24.6* 24.9* 29.6*  INR 2.24* 2.28* 2.87*    Estimated Creatinine Clearance: 61.7 mL/min (by C-G formula based on Cr of 1.56).    Medications:  Scheduled:  . carbamazepine  100 mg Oral BID  . docusate sodium  100 mg Oral BID  . gabapentin  800 mg Oral QID  . OLANZapine  10 mg Oral QHS  . OLANZapine  5 mg Oral BID  . pantoprazole  20 mg Oral BID AC  . prazosin  2 mg Oral QHS  . simvastatin  20 mg Oral q1800  . traZODone  100 mg Oral QHS  . 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 Home dose  daily except  TuSat with last dose unknown. Admit INR low at 1.13, likely non-compliant.  Warfarin from 8/8: 10,10,15,10,15,15,5, held, 7.5.7.5mg   INR=2.87 Goal of Therapy:  INR 2-3    Plan:   The INR remains within goal range today.  Tegretol  XR BID has been added to the medications and this may decrease the effect of Coumadin on the INR  Will give Coumadin  X1 today at 1800  Follow up on PT/INR in AM hope to be able to resume previous home dose soon.  Loletta Specter 11/21/2014,1:22 PM

## 2014-11-22 LAB — PROTIME-INR
INR: 2.54 — AB (ref 0.00–1.49)
PROTHROMBIN TIME: 27 s — AB (ref 11.6–15.2)

## 2014-11-22 MED ORDER — WARFARIN SODIUM 5 MG PO TABS
5.0000 mg | ORAL_TABLET | Freq: Once | ORAL | Status: AC
  Administered 2014-11-22: 5 mg via ORAL
  Filled 2014-11-22: qty 1

## 2014-11-22 MED ORDER — CARBAMAZEPINE ER 200 MG PO TB12
200.0000 mg | ORAL_TABLET | Freq: Two times a day (BID) | ORAL | Status: DC
  Administered 2014-11-22 – 2014-11-27 (×11): 200 mg via ORAL
  Filled 2014-11-22 (×14): qty 1

## 2014-11-22 NOTE — [facility] Group Notes (Signed)
[facility] LCSW Group Therapy  11/22/2014 1:16 PM  Type of Therapy:  Group Therapy  Participation Level:  Active  Participation Quality:  Attentive  Affect:  Appropriate  Cognitive:  Alert and Oriented  Insight:  Improving  Engagement in Therapy:  Engaged  Modes of Intervention:  Confrontation, Discussion, Education, Problem-solving, Rapport Building, Socialization and Support  Summary of Progress/Problems: Feelings around Relapse. Group members discussed the meaning of relapse and shared personal stories of relapse, how it affected them and others, and how they perceived themselves during this time. Group members were encouraged to identify triggers, warning signs and coping skills used when facing the possibility of relapse. Social supports were discussed and explored in detail. Post Acute Withdrawal Syndrome (handout provided) was introduced and examined. Pt's were encouraged to ask questions, talk about key points associated with PAWS, and process this information in terms of relapse prevention. Dale [name] shared his latest relapse experience including the shame/guilt he feels in letting down his girlfriend and himself. 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   [place], Dale [name] LCSWA 11/22/2014, 1:16 PM

## 2014-11-22 NOTE — Progress Notes (Signed)
D.  Pt pleasant on approach, denies complaints at this time.  Positive for evening wrap up group with appropriate participation.  Interacting appropriately with peers on the unit.  Continues to endorse passive SI but contracts for safety while in the hospital.  Denies HI/hallucinations at this time.  A.  Support and encouragement offered  R.  Pt remains safe on the unit, will continue to monitor.

## 2014-11-22 NOTE — Progress Notes (Addendum)
ANTICOAGULATION CONSULT NOTE - Follow Up Consult  Pharmacy Consult for Coumadin Indication: DVT  Allergies  Allergen Reactions  . Ibuprofen Hives    Patient Measurements: Height: 5\' 11"  (180.3 cm) Weight: 188 lb (85.276 kg) IBW/kg (Calculated) : 75.3 Heparin Dosing Weight:   Vital Signs: Temp: 97.8 F (36.6 C) (08/19 0630) Temp Source: Oral (08/19 0630) BP: 112/78 mmHg (08/19 0631) Pulse Rate: 82 (08/19 0631)  Labs:  Recent Labs  11/20/14 0640 11/21/14 0626 11/22/14 0630  LABPROT 24.9* 29.6* 27.0*  INR 2.28* 2.87* 2.54*    Estimated Creatinine Clearance: 61.7 mL/min (by C-G formula based on Cr of 1.56).   Medications:  Scheduled:  . carbamazepine  200 mg Oral BID  . docusate sodium  100 mg Oral BID  . feeding supplement (GLUCERNA SHAKE)  237 mL Oral TID BM  . gabapentin  800 mg Oral QID  . OLANZapine  10 mg Oral QHS  . OLANZapine  5 mg Oral BID  . pantoprazole  20 mg Oral BID AC  . prazosin  2 mg Oral QHS  . simvastatin  20 mg Oral q1800  . traZODone  100 mg Oral QHS  . warfarin  5 mg Oral ONCE-1800  . 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 Home dose 10mg  daily except 5mg  TuSat with last dose unknown. Admit INR low at 1.13, likely non-compliant.  Warfarin from 8/8: 10,10,15,10,15,15,5, held, 7.5.7.5, 5 mg  INR= 2.54 Goal of Therapy:  INR 2-3    Plan:   INR is therapeutic.Tegretol 200mg  XR BID has been added to the medications and this may decrease the effect of Coumadin on the INR  Will give Coumadin 5mg  X1 today at 1800  Check PT/INR in AM . Hopefully can begin checking INR on alternate days.  Loletta Specter 11/22/2014,2:11 PM

## 2014-11-22 NOTE — Progress Notes (Signed)
Tonight in wrap up group Camdin stated that his day was a 10, he was glad to be able to help someone as he told his story. He stated that once he is discharged he hopes to start at the [facility] program and ultimately be able to return to [facility] for his medications and psychiatry.
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
## 2014-11-22 NOTE — [facility] Group Notes (Signed)
[facility] LCSW Aftercare Discharge Planning Group Note   11/22/2014 10:02 AM  Participation Quality:  DID NOT ATTEND. Pt in room sleeping.   [employer]

## 2014-11-22 NOTE — Progress Notes (Signed)
Adult Psychoeducational Group Note  Date:  11/22/2014 Time:  4:29 PM  Group Topic/Focus:  Relapse Prevention Planning:   The focus of this group is to define relapse and discuss the need for planning to combat relapse.  Participation Level:  Minimal  Participation Quality:  Drowsy and Sharing  Affect:  Appropriate  Cognitive:  Appropriate  Insight: Appropriate  Engagement in Group:  Lacking  Modes of Intervention:  Discussion  Additional Comments: Patient did attend the group this morning.  He fell asleep several times, patient stated that he suffered from a sleep disorder.  Patient states that relapse came from hanging out with friends who drink and do drugs. Patient wants to remain clean and find a more positive set of friends to hang out with.  Patient states that he feels he has another chance at life and recovery and he wants to do good and succeed.   Bobbie Valletta R Mylani Gentry 11/22/2014, 4:29 PM

## 2014-11-22 NOTE — Progress Notes (Signed)
D:  Per pt self inventory pt reports sleeping has improved with medication, appetite is good, energy level is fair, rates depression at a 4/10 . C/o chronic right foot pain, limping however starts laughing and joking with peers after receiving medications. Goal for today is to stay focused .  A:  Support and encouragement provided, encouraged pt to attend all groups and activities, q15 minute checks continued for safety.   R- Will continue to monitor on q 15 minute checks for safety, compliant with medications and programing

## 2014-11-22 NOTE — Clinical Social Work Note (Signed)
Pt denies SI/HI/AVH today and reports feeling clear headed and motivated to seek aftercare/inpatient treatment.   Trula Slade, LCSWA Clinical Social Worker 11/22/2014 3:48 PM

## 2014-11-22 NOTE — Clinical Social Work Note (Signed)
Per pt request, [facility] and [facility] referrals sent this afternoon. No adatc beds available today.  Trula Slade, LCSWA Clinical Social Worker 11/22/2014 3:53 PM

## 2014-11-23 LAB — PROTIME-INR
INR: 2.52 — ABNORMAL HIGH (ref 0.00–1.49)
Prothrombin Time: 26.8 seconds — ABNORMAL HIGH (ref 11.6–15.2)

## 2014-11-23 LAB — GLUCOSE, CAPILLARY: Glucose-Capillary: 146 mg/dL — ABNORMAL HIGH (ref 65–99)

## 2014-11-23 MED ORDER — PRAZOSIN HCL 1 MG PO CAPS
3.0000 mg | ORAL_CAPSULE | Freq: Every day | ORAL | Status: DC
  Administered 2014-11-23: 3 mg via ORAL
  Filled 2014-11-23 (×4): qty 3

## 2014-11-23 MED ORDER — PANTOPRAZOLE SODIUM 40 MG PO TBEC
40.0000 mg | DELAYED_RELEASE_TABLET | Freq: Two times a day (BID) | ORAL | Status: DC
  Administered 2014-11-23 – 2014-11-27 (×9): 40 mg via ORAL
  Filled 2014-11-23 (×12): qty 1

## 2014-11-23 MED ORDER — WARFARIN SODIUM 5 MG PO TABS
5.0000 mg | ORAL_TABLET | Freq: Every day | ORAL | Status: DC
  Administered 2014-11-23 – 2014-11-26 (×4): 5 mg via ORAL
  Filled 2014-11-23 (×5): qty 1

## 2014-11-23 MED ORDER — DICYCLOMINE HCL 20 MG PO TABS
20.0000 mg | ORAL_TABLET | Freq: Three times a day (TID) | ORAL | Status: DC
  Administered 2014-11-23 – 2014-11-27 (×14): 20 mg via ORAL
  Filled 2014-11-23 (×15): qty 1
  Filled 2014-11-23: qty 42
  Filled 2014-11-23 (×3): qty 1
  Filled 2014-11-23: qty 42
  Filled 2014-11-23: qty 1
  Filled 2014-11-23: qty 42

## 2014-11-23 NOTE — Progress Notes (Signed)
[phone]  Pt reported abdominal pain last night beginning at around 3 AM.  Pt states he was unable to go back to sleep due to this.  Pt did not let staff know until nearly 0600 and did not appear in acute distress.  Pt requested doctor to see him in AM.  Pt was given protonix and ginger ale.

## 2014-11-23 NOTE — Progress Notes (Signed)
Psychoeducational Group Note  Date: 11/23/2014 Time: 1015  Group Topic/Focus:  Identifying Needs:   The focus of this group is to help patients identify their personal needs that have been historically problematic and identify healthy behaviors to address their needs.  Participation Level:  Minimal  Participation Quality:  Drowsy  Affect:  Lethargic  Cognitive:  Lacking  Insight:  Engaged  Engagement in Group:  Engaged  Additional Comments:    8/20/20164:37 PM Viveca Beckstrom, Joie Bimler

## 2014-11-23 NOTE — Progress Notes (Signed)
ANTICOAGULATION CONSULT NOTE - Follow Up Consult  Pharmacy Consult for Coumadin  Indication: h/o dvt   Allergies  Allergen Reactions  . Ibuprofen Hives    Patient Measurements: Height: 5\' 11"  (180.3 cm) Weight: 188 lb (85.276 kg) IBW/kg (Calculated) : 75.3   Vital Signs: Temp: 97.9 F (36.6 C) (08/20 0606) BP: 112/77 mmHg (08/20 0607) Pulse Rate: 97 (08/20 0607)  Labs:  Recent Labs  11/21/14 0626 11/22/14 0630 11/23/14 0630  LABPROT 29.6* 27.0* 26.8*  INR 2.87* 2.54* 2.52*    Estimated Creatinine Clearance: 61.7 mL/min (by C-G formula based on Cr of 1.56).   Medications:  Scheduled:  . carbamazepine  200 mg Oral BID  . dicyclomine  20 mg Oral TID AC  . docusate sodium  100 mg Oral BID  . feeding supplement (GLUCERNA SHAKE)  237 mL Oral TID BM  . gabapentin  800 mg Oral QID  . OLANZapine  10 mg Oral QHS  . OLANZapine  5 mg Oral BID  . pantoprazole  40 mg Oral BID AC  . prazosin  3 mg Oral QHS  . simvastatin  20 mg Oral q1800  . traZODone  100 mg Oral QHS  . warfarin  5 mg Oral q1800  . Warfarin - Pharmacist Dosing Inpatient   Does not apply q1800    Assessment: INR at goal  Goal of Therapy:  INR 2-3    Plan:  Start coumadin 5 mg q1800 for now  Follow up PT/INR Monday am labs   Charyl Dancer 11/23/2014,10:27 AM

## 2014-11-23 NOTE — B[facility] Group Notes (Signed)
Adult Psychoeducational Group Note  Date:  11/23/2014 Time:  8:00pm Group Topic/Focus:  Wrap-Up Group:   The focus of this group is to help patients review their daily goal of treatment and discuss progress on daily workbooks.  Participation Level:  Active  Participation Quality:  Appropriate and Attentive  Affect:  Appropriate  Cognitive:  Alert and Appropriate  Insight: Appropriate  Engagement in Group:  Engaged  Modes of Intervention:  Discussion  Additional Comments:  Pt was attentive and appropriate during tonight wrap up group meeting with AA.   Bing Plume D 11/23/2014, 10:42 PM

## 2014-11-23 NOTE — [facility] Group Notes (Signed)
[facility] Group Notes: (Clinical Social Work)   11/23/2014      Type of Therapy:  Group Therapy   Participation Level:  Did Not Attend despite MHT prompting   Ambrose Mantle, LCSW 11/23/2014, 12:50 PM

## 2014-11-23 NOTE — Progress Notes (Signed)
D . Pt pleasant on approach, complaint of frequent urination so requested that his blood sugar be checked, it was 146.  Pt did attend evening AA group with appropriate participation.  Interacting appropriately with peers on the unit.  Pt continues to endorse passive SI, but denies HI/hallucinations at this time.  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  Pt states that his pain is never less than 6/10.  A.  Support and encouragement offered, medication given as ordered  R.  Pt remains safe on the unit, will continue to monitor.
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
## 2014-11-23 NOTE — Progress Notes (Signed)
D) Pt did not attend the groups this morning. Affect is flat and mood depressed  States he is having a very rough time today. Hearing voices that are telling him to hurt himself. Pt rates his depression at a 10, hopelessness at a 10 and his anxiety at a 10. Denies SI and HI, but is very worried with the voices. Pt states that walking up and down the hall helps to relieve his stress. A) given support, reassurance and praise along with encouragement. Verbal contract made with Pt to come and get staff should he feel he cannot maintain his safety. Encouraged to walk as often as he needs to. Provided with a 1:1 R) Pt states he wants to stay safe and will come to staff.

## 2014-11-23 NOTE — [facility] Group Notes (Addendum)
[facility] Group Notes:  (Nursing/MHT/Case Management/Adjunct)  Date:  11/23/2014  Time:  10:27 AM  Type of Therapy:  Nurse Education /  Goals Group: The group is focused on teaching patients how to set attainable goals as well as how to develop skills needed to accomplish those goals.  Participation Level:  Patient did not attend.  Participation Quality:    Affect:    Cognitive:    Insight:    Engagement in Group:    Modes of Intervention:    Summary of Progress/Problems:  Dale [name] 11/23/2014, 10:27 AM

## 2014-11-24 MED ORDER — PRAZOSIN HCL 2 MG PO CAPS
4.0000 mg | ORAL_CAPSULE | Freq: Every day | ORAL | Status: DC
  Administered 2014-11-24: 4 mg via ORAL
  Filled 2014-11-24 (×3): qty 2
  Filled 2014-11-24: qty 4

## 2014-11-24 MED ORDER — OLANZAPINE 7.5 MG PO TABS
7.5000 mg | ORAL_TABLET | Freq: Two times a day (BID) | ORAL | Status: DC
  Administered 2014-11-24 – 2014-11-27 (×7): 7.5 mg via ORAL
  Filled 2014-11-24 (×5): qty 1
  Filled 2014-11-24: qty 3
  Filled 2014-11-24 (×5): qty 1

## 2014-11-24 NOTE — Progress Notes (Signed)
Psychoeducational Group Note  Date:  11/24/2014 Time: 1315 Group Topic/Focus:  Making Healthy Choices:   The focus of this group is to help patients identify negative/unhealthy choices they were using prior to admission and identify positive/healthier coping strategies to replace them upon discharge.  Participation Level:  Active  Participation Quality:  Appropriate  Affect:  Appropriate  Cognitive:  Alert  Insight:  Engaged  Engagement in Group:  Engaged  Additional Comments:    Ioanna Colquhoun Lynn 7:10 PM. 11/24/2014 

## 2014-11-24 NOTE — Progress Notes (Signed)
D.  Pt pleasant on approach, in dayroom interacting appropriately with peers on unit.  Continues to endorse passive suicidal ideation at times due to voices to hurt himself but states that those voices are less today.  Positive for evening AA group, interacting appropriately with peers on the unit.  Pt states his feet have been swelling and Dr. Dub Mikes told him to elevate them tonight.  Pt continues to suffer from chronic right foot pain, taking Tramadol twice daily for pain control.  Pt states the pain, even with the medication, does not get below a 6/10.   A.  Support and encouragement offered.  Will look for pillows and blankets to assist with elevating Pt's legs since his bed is not adjustable.  R.  Pt remains safe on the unit, will continue to monitor.

## 2014-11-24 NOTE — Plan of Care (Signed)
Problem: Alteration in mood & ability to function due to Goal: STG-Patient will attend groups Outcome: Progressing Pt has been attending all groups with appropriate participation

## 2014-11-24 NOTE — Progress Notes (Signed)
Patient did attend the evening speaker AA meeting.  
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
## 2014-11-24 NOTE — [facility] Group Notes (Signed)
[facility] Group Notes: (Clinical Social Work)   11/24/2014      Type of Therapy:  Group Therapy   Participation Level:  Did Not Attend despite MHT prompting   Ambrose Mantle, LCSW 11/24/2014, 5:07 PM

## 2014-11-24 NOTE — Progress Notes (Addendum)
D) Pt has been interacting with his peers. Falls asleep frequently in group r/t the medications. Is pleasant and interactive when awake in the groups. Continues to hear voices but they are less intrusive today than yesterday. Is pleasant and using humor with staff and patients. Rates his depression and hopelessness as a 10 and his anxiety as an 8. The voices are telling him to hurt himself, but Pt has contracted to come to staff. A) Given support, reassurance and praise. Therapeutic humor used. Frequent contact with Pt. R) Pt states he will not act on his voices. Denies HI.

## 2014-11-25 LAB — GLUCOSE, CAPILLARY
GLUCOSE-CAPILLARY: 252 mg/dL — AB (ref 65–99)
Glucose-Capillary: 226 mg/dL — ABNORMAL HIGH (ref 65–99)
Glucose-Capillary: 194 mg/dL — ABNORMAL HIGH (ref 65–99)

## 2014-11-25 LAB — PROTIME-INR
INR: 1.87 — ABNORMAL HIGH (ref 0.00–1.49)
PROTHROMBIN TIME: 21.5 s — AB (ref 11.6–15.2)

## 2014-11-25 MED ORDER — PRAZOSIN HCL 1 MG PO CAPS
3.0000 mg | ORAL_CAPSULE | Freq: Every day | ORAL | Status: DC
  Administered 2014-11-25: 3 mg via ORAL
  Filled 2014-11-25 (×3): qty 3

## 2014-11-25 MED ORDER — WARFARIN SODIUM 10 MG PO TABS
10.0000 mg | ORAL_TABLET | Freq: Once | ORAL | Status: AC
  Administered 2014-11-25: 10 mg via ORAL
  Filled 2014-11-25: qty 1

## 2014-11-25 MED ORDER — METFORMIN HCL 500 MG PO TABS
500.0000 mg | ORAL_TABLET | Freq: Every day | ORAL | Status: DC
  Administered 2014-11-25 – 2014-11-27 (×3): 500 mg via ORAL
  Filled 2014-11-25 (×3): qty 1
  Filled 2014-11-25: qty 14
  Filled 2014-11-25 (×4): qty 1

## 2014-11-25 NOTE — Plan of Care (Signed)
Problem: Alteration in mood & ability to function due to Goal: STG-Patient will comply with prescribed medication regimen (Patient will comply with prescribed medication regimen)  Outcome: Adequate for Discharge Patient compliant with medications.  He understands dosage and indications of each.

## 2014-11-25 NOTE — Clinical Social Work Note (Signed)
CSW left voicemail for [facility] admissions coordinators regarding referral status.  [facility] screening set up on 8/25 at 8am.   Samuella Bruin, MSW, [facility] Clinical Social Worker [facility] [phone]

## 2014-11-25 NOTE — Progress Notes (Signed)
ANTICOAGULATION CONSULT NOTE - Follow Up Consult  Pharmacy Consult for Coumadin Indication: h/o dvt  Allergies  Allergen Reactions  . Ibuprofen Hives    Patient Measurements: Height: 5\' 11"  (180.3 cm) Weight: 188 lb (85.276 kg) IBW/kg (Calculated) : 75.3   Labs:  Recent Labs  11/23/14 0630 11/25/14 0615  LABPROT 26.8* 21.5*  INR 2.52* 1.87*    Estimated Creatinine Clearance: 61.7 mL/min (by C-G formula based on Cr of 1.56).   Medications:  Scheduled:  . carbamazepine  200 mg Oral BID  . dicyclomine  20 mg Oral TID AC  . docusate sodium  100 mg Oral BID  . feeding supplement (GLUCERNA SHAKE)  237 mL Oral TID BM  . gabapentin  800 mg Oral QID  . OLANZapine  10 mg Oral QHS  . OLANZapine  7.5 mg Oral BID  . pantoprazole  40 mg Oral BID AC  . prazosin  4 mg Oral QHS  . simvastatin  20 mg Oral q1800  . traZODone  100 mg Oral QHS  . warfarin  10 mg Oral ONCE-1800  . warfarin  5 mg Oral q1800  . Warfarin - Pharmacist Dosing Inpatient   Does not apply q1800    Assessment: INR below goal of INR 1.87 Goal of Therapy:  INR 2-3    Plan:  Coumadin 10 mg x 1 today  PT/INR in am   Charyl Dancer 11/25/2014,8:11 AM

## 2014-11-25 NOTE — Plan of Care (Signed)
Problem: Ineffective individual coping Goal: STG: Patient will remain free from self harm Outcome: Progressing Patient denies SI.  Patient's mood is brighter; he is pleasant.
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
## 2014-11-25 NOTE — Progress Notes (Signed)
Recreation Therapy Notes  Date: 08.22.2016 Time: 08.22.2016 Location: [address] Dayroom   Group Topic: Stress Management  Goal Area(s) Addresses:  Patient will actively participate in stress management techniques presented during session.   Behavioral Response: Appropriate, Engaged, Attentive  Intervention: Stress management techniques  Activity :  Deep Breathing and Guided Imagery. LRT provided instruction and demonstration on practice of Guided Imagery. Technique was coupled with deep breathing.   Education:  Stress Management, Discharge Planning.   Education Outcome: Acknowledges edcuation  Clinical Observations/Feedback: Patient actively engaged in techniques presented, expressing no concerns and expressing ability to practice independently post d/c.    [name] Gustavo Dispenza, LRT/CTRS  Korie Streat L 11/25/2014 2:06 PM

## 2014-11-25 NOTE — [facility] Group Notes (Signed)
   [facility] LCSW Aftercare Discharge Planning Group Note  11/25/2014  8:45 AM   Participation Quality: Alert, Appropriate and Oriented  Mood/Affect: Appropriate  Depression Rating: 4  Anxiety Rating: 0  Thoughts of Suicide: Pt denies SI/HI  Will you contract for safety? Yes  Current AVH: Pt denies  Plan for Discharge/Comments: Pt attended discharge planning group and actively participated in group. CSW provided pt with today's workbook. Patient reports feeling "good" today. Patient continues to await residential treatment- referrals pending at [facility], [facility], and [facility].  Transportation Means: CSW continuing to assess for transportation needs  Supports: No supports mentioned at this time  Samuella Bruin, MSW, [employer] Clinical Social Worker [employer] [phone]

## 2014-11-25 NOTE — Plan of Care (Signed)
Problem: Alteration in mood & ability to function due to Goal: STG-Patient will attend groups Outcome: Progressing Pt has attended all groups      

## 2014-11-25 NOTE — Clinical Social Work Note (Signed)
[facility] LCSW Group Therapy 11/25/2014  1:15 PM   Type of Therapy: Group Therapy  Participation Level: Did Not Attend. Patient invited to participate but declined.   Marlen Mollica, MSW, LCSWA Clinical Social Worker [facility] [phone]   

## 2014-11-26 LAB — PROTIME-INR
INR: 1.97 — AB (ref 0.00–1.49)
Prothrombin Time: 22.3 seconds — ABNORMAL HIGH (ref 11.6–15.2)

## 2014-11-26 LAB — GLUCOSE, CAPILLARY
GLUCOSE-CAPILLARY: 179 mg/dL — AB (ref 65–99)
GLUCOSE-CAPILLARY: 113 mg/dL — AB (ref 65–99)
GLUCOSE-CAPILLARY: 118 mg/dL — AB (ref 65–99)

## 2014-11-26 MED ORDER — TRAMADOL HCL 50 MG PO TABS
50.0000 mg | ORAL_TABLET | Freq: Four times a day (QID) | ORAL | Status: DC | PRN
  Administered 2014-11-26 – 2014-11-27 (×2): 50 mg via ORAL
  Filled 2014-11-26 (×2): qty 1

## 2014-11-26 MED ORDER — WARFARIN SODIUM 10 MG PO TABS
10.0000 mg | ORAL_TABLET | Freq: Once | ORAL | Status: AC
  Administered 2014-11-26: 10 mg via ORAL
  Filled 2014-11-26: qty 1

## 2014-11-26 MED ORDER — TRAZODONE HCL 150 MG PO TABS
150.0000 mg | ORAL_TABLET | Freq: Every day | ORAL | Status: DC
  Administered 2014-11-26 – 2014-11-27 (×2): 150 mg via ORAL
  Filled 2014-11-26 (×3): qty 1

## 2014-11-26 MED ORDER — PRAZOSIN HCL 2 MG PO CAPS
2.0000 mg | ORAL_CAPSULE | Freq: Every day | ORAL | Status: DC
  Administered 2014-11-26 – 2014-11-27 (×2): 2 mg via ORAL
  Filled 2014-11-26: qty 14
  Filled 2014-11-26 (×3): qty 1

## 2014-11-26 NOTE — Progress Notes (Signed)
Recreation Therapy Notes  Animal-Assisted Activity (AAA) Program Checklist/Progress Notes Patient Eligibility Criteria Checklist & Daily Group note for Rec Tx Intervention  Date: 08.23.2016 Time: 2:45pm Location: [address]    AAA/T Program Assumption of Risk Form signed by Patient/ or Parent Legal Guardian yes  Patient is free of allergies or sever asthma yes  Patient reports no fear of animals yes  Patient reports no history of cruelty to animals yes  Patient understands his/her participation is voluntary yes  Patient washes hands before animal contact yes  Patient washes hands after animal contact yes  Behavioral Response: Attentive, Appropriate   Education: Hand Washing, Appropriate Animal Interaction   Education Outcome: Acknowledges understanding   Clinical Observations/Feedback: Patient actively engaged in session, petting therapy dog appropriately. Additionally patient engaged appropriately with peers and handler.   [name], LRT/CTRS  Theda Payer L 11/26/2014 3:04 PM

## 2014-11-26 NOTE — Tx Team (Addendum)
Interdisciplinary Treatment Plan Update (Adult) Date: 11/26/2014    Time Reviewed: 9:30 AM  Progress in Treatment: Attending groups: Minimally Participating in groups: Minimally Taking medication as prescribed: Yes Tolerating medication: Yes Family/Significant other contact made: No, patient has declined collateral contacts Patient understands diagnosis: Yes Discussing patient identified problems/goals with staff: Yes Medical problems stabilized or resolved: Yes Denies suicidal/homicidal ideation: Yes, patient denies SI/HI Issues/concerns per patient self-inventory: Yes Other:  New problem(s) identified: N/A  Discharge Plan or Barriers: 8/9:  CSW continuing to assess, patient new to milieu. 8/12: MD recommending [facility], referral pending. No bed availability at this time. 8/16: [facility] referral pending. 8/18: [facility] referral pending. No bed availability at this time. 8/23: [facility] admissions screening on 8/25. Referral pending at [facility]. No bed availability at [facility] at this time.  Reason for Continuation of Hospitalization:  Depression Anxiety Medication Stabilization   Comments: N/A  Estimated length of stay: 1-2 days   Patient is a 49 year old African American male admitted for SI, HI, AH, and cocaine/ETOH detox. Patient is homeless in [place]. Patient will benefit from crisis stabilization, medication evaluation, group therapy, and psycho education in addition to case management for discharge planning. Patient and CSW reviewed pt's identified goals and treatment plan. Pt verbalized understanding and agreed to treatment plan.     Review of initial/current patient goals per problem list:  1. Goal(s): Patient will participate in aftercare plan   Met: No   Target date: 3-5 days post admission date   As evidenced by: Patient will participate within aftercare plan AEB aftercare provider and housing plan at discharge being identified.  8/9: Goal not met: CSW assessing  for appropriate referrals for pt and will have follow up secured prior to d/c. 8/12: Referral pending at  [place]. 8/16: Referral pending at [place]. 8/18: Referral pending at [place]. 8/23: [facility] admissions screening on 8/25. Referral pending at [facility]. No bed availability at [facility] at this time.    2. Goal (s): Patient will exhibit decreased depressive symptoms and suicidal ideations.   Met:  Yes   Target date: 3-5 days post admission date   As evidenced by: Patient will utilize self rating of depression at 3 or below and demonstrate decreased signs of depression or be deemed stable for discharge by MD.  8/9: Goal not met: Pt presents with flat affect and depressed mood.  Pt admitted with depression rating of 10.  Pt to show decreased sign of depression and a rating of 3 or less before d/c.   8/12: Patient continues to endorse high levels of depression and SI. 8/16: Patient continues to endorse high levels of depression and SI. 8/18: Patient continues to endorse high levels of depression. 8/23: Goal progressing. 8/25: Goal met: Patient discharged to [facility] today for screening.   3. Goal(s): Patient will demonstrate decreased signs and symptoms of anxiety.   Met: Adequate for discharge per MD   Target date: 3-5 days post admission date   As evidenced by: Patient will utilize self rating of anxiety at 3 or below and demonstrated decreased signs of anxiety, or be deemed stable for discharge by MD  8/9: Goal not met: Pt presents with anxious mood and affect.  Pt admitted with anxiety rating of 10.  Pt to show decreased sign of anxiety and a rating of 3 or less before d/c. 8/12: Patient continues to endorse high levels of anxiety. 8/16: Patient continues to endorse high levels of anxiety. 8/18: Patient continues to endorse high levels of anxiety.  8/23: Goal progressing. 8/25: Adequate for discharge: Patient reports feeling safe for discharge.   4. Goal(s): Patient will  demonstrate decreased signs of withdrawal due to substance abuse   Met: Yes   Target date: 3-5 days post admission date   As evidenced by: Patient will produce a CIWA/COWS score of 0, have stable vitals signs, and no symptoms of withdrawal  8/9: Goal met: No withdrawal symptoms reported at this time per medical chart.   5. Goal(s): Patient will demonstrate decreased signs of psychosis  * Met: Adequate for discharge per MD.  * Target date: 3-5 days post admission date  * As evidenced by: Patient will demonstrate decreased frequency of AVH or return to baseline function  8/9: Goal not met: Pt to take medication as prescribed to decrease psychosis to baseline.  8/12: Goal not met: Pt to take medication as prescribed to decrease psychosis to baseline.  8/16: Goal not met: Pt to take medication as prescribed to decrease psychosis to baseline.  8/18: Goal not met: Pt to take medication as prescribed to decrease psychosis to baseline.  8/23: Goal not met: Pt to take medication as prescribed to decrease psychosis to baseline.  8/25: Adequate for discharge: Patient denied AH at discharge.    Attendees: Patient:    Family:    Physician: Dr. Parke Poisson; Dr. Sabra Heck 11/26/2014 9:30 AM  Nursing: Gillian Shields, Marcene Duos, RN 11/26/2014 9:30 AM  Clinical Social Worker: Tilden Fossa,  LCSWA 11/26/2014 9:30 AM  Other: Maxie Better, Peri Maris, LCSWA  11/26/2014 9:30 AM  Other: Lucinda Dell, Beverly Sessions Liaison 11/26/2014 9:30 AM  Other:  11/26/2014 9:30 AM  Other:  Ave Filter, NP 11/26/2014 9:30 AM  Other:    Other:        Scribe for Treatment Team:  Tilden Fossa, MSW, [employer] [phone]

## 2014-11-26 NOTE — [facility] Group Notes (Signed)
[facility] Group Notes:  (Nursing/MHT/Case Management/Adjunct)  Date:  11/26/2014  Time:  0900 am  Type of Therapy:  Psychoeducational Skills  Participation Level:  Active  Participation Quality:  Monopolizing  Affect:  Appropriate  Cognitive:  Alert and Appropriate  Insight:  Improving  Engagement in Group:  Improving  Modes of Intervention:  Support  Summary of Progress/Problems: Patient was very vocal during group.  Patient's mood has significantly improved since admission.  Patient more optimistic and insight has improved.  Cranford Mon 11/26/2014, 9:36 AM

## 2014-11-26 NOTE — Progress Notes (Signed)
ANTICOAGULATION CONSULT NOTE - Follow Up Consult  Pharmacy Consult for coumadin Indication: h/o dvt   Allergies  Allergen Reactions  . Ibuprofen Hives    Patient Measurements: Height:  (180.3 cm) Weight: 188 lb (85.276 kg) IBW/kg (Calculated) : 75.3   Vital Signs: Temp: 97.9 F (36.6 C) (08/23 0630) Temp Source: Oral (08/23 0630) BP: 107/65 mmHg (08/23 0631) Pulse Rate: 104 (08/23 0631)  Labs:  Recent Labs  11/25/14 0615 11/26/14 0621  LABPROT 21.5* 22.3*  INR 1.87* 1.97*    Estimated Creatinine Clearance: 61.7 mL/min (by C-G formula based on Cr of 1.56).   Medications:  Scheduled:  . carbamazepine  200 mg Oral BID  . dicyclomine  20 mg Oral TID AC  . docusate sodium  100 mg Oral BID  . feeding supplement (GLUCERNA SHAKE)  237 mL Oral TID BM  . gabapentin  800 mg Oral QID  . metFORMIN  500 mg Oral Q breakfast  . OLANZapine  10 mg Oral QHS  . OLANZapine  7.5 mg Oral BID  . pantoprazole  40 mg Oral BID AC  . prazosin  2 mg Oral QHS  . simvastatin  20 mg Oral q1800  . traZODone  150 mg Oral QHS  . warfarin  10 mg Oral ONCE-1800  . warfarin  5 mg Oral q1800  . Warfarin - Pharmacist Dosing Inpatient   Does not apply q1800    Assessment: INR is below goal of 2-3 Goal of Therapy:  INR 2-3    Plan:  Coumadin 10 mg x 1 today  PT/INR in am   Charyl Dancer 11/26/2014,2:32 PM
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
## 2014-11-26 NOTE — [facility] Group Notes (Signed)
[facility] LCSW Group Therapy  11/26/2014 1:27 PM  Type of Therapy:  Group Therapy  Participation Level:  Did Not Attend-pt invited but chose to go to room/lethargic/drowsy  Summary of Progress/Problems: [name] came to talk about his personal journey with substance abuse and addiction. The pt processed ways by which to relate to the speaker. [name] provided handouts and educational information pertaining to groups and services offered by the [facility].   Smart, Dale [name] LCSWA 11/26/2014, 1:27 PM

## 2014-11-26 NOTE — Progress Notes (Signed)
D: Patient has bright affect upon approach.  He spoke freely during group. Patient's affect does not match his self inventory.  He continues to rate his depression and hopelessness as a 10.  He rates his anxiety as an 8.  He continues to complain of right leg pain.  Patient states he is sleeping poorly.  Patient states he is suicidal on his self inventory, however, states he is not verbally.  His goal is to "stay clean."  Patient is a possible discharge to [facility] on Thurday. A: Continue to monitor medication management and MD orders.  Safety checks completed every 15 minutes per protocol.  Offer support and encouragement as needed. R: Patient's behavior is appropriate to situation.

## 2014-11-26 NOTE — Progress Notes (Signed)
Pt has been observed most of the evening in the dayroom sitting in the corner.  He reports that he was very hungry before AA group this evening.  Writer looked for an appropriate snack in the 300 hall galley, but had to go to other galleys to find one.  By the time a snack was found, the patients were in AA group.  Snack was given after group along with his scheduled Glucerna.  Pt reports he has had a fairly good day.  He says he wants long term rehab, so referrals have been made.  Pt makes his needs known to staff.  Support and encouragement offered.  Safety maintained with q15 minute checks.

## 2014-11-26 NOTE — Progress Notes (Signed)
Patient attend AA group tonight.  

## 2014-11-26 NOTE — Progress Notes (Signed)
Pt reports he has had a good day today.  He says he will probably go to [facility] on Thursday.  He denies SI/HI/AVH.  He stays mostly in the dayroom watching TV unless he is in bed asleep.  He makes his needs known to staff.  His CBGs are trending down into the normal range.  He says the 50 mg of Tramadol is not helping his pain, but he understands that he has to be off of the med before he can go to [facility].  Support and encouragement offered.    Safety maintained with q15 minute checks.

## 2014-11-27 ENCOUNTER — Encounter (HOSPITAL_COMMUNITY): Payer: Self-pay | Admitting: Registered Nurse

## 2014-11-27 LAB — GLUCOSE, CAPILLARY
GLUCOSE-CAPILLARY: 112 mg/dL — AB (ref 65–99)
Glucose-Capillary: 165 mg/dL — ABNORMAL HIGH (ref 65–99)
Glucose-Capillary: 121 mg/dL — ABNORMAL HIGH (ref 65–99)
Glucose-Capillary: 164 mg/dL — ABNORMAL HIGH (ref 65–99)

## 2014-11-27 LAB — PROTIME-INR
INR: 3.03 — ABNORMAL HIGH (ref 0.00–1.49)
PROTHROMBIN TIME: 30.8 s — AB (ref 11.6–15.2)

## 2014-11-27 MED ORDER — CARBAMAZEPINE ER 200 MG PO TB12
200.0000 mg | ORAL_TABLET | Freq: Two times a day (BID) | ORAL | Status: DC
  Filled 2014-11-27: qty 28

## 2014-11-27 MED ORDER — OLANZAPINE 10 MG PO TABS: 10.0000 mg | ORAL_TABLET | Freq: Every day | ORAL | Status: DC

## 2014-11-27 MED ORDER — OLANZAPINE 7.5 MG PO TABS: 7.5000 mg | ORAL_TABLET | Freq: Two times a day (BID) | ORAL | Status: DC

## 2014-11-27 MED ORDER — PRAZOSIN HCL 2 MG PO CAPS: 2.0000 mg | ORAL_CAPSULE | Freq: Every day | ORAL | Status: DC

## 2014-11-27 MED ORDER — DOCUSATE SODIUM 100 MG PO CAPS: 100.0000 mg | ORAL_CAPSULE | Freq: Two times a day (BID) | ORAL | Status: DC

## 2014-11-27 MED ORDER — WARFARIN SODIUM 1 MG PO TABS
1.0000 mg | ORAL_TABLET | Freq: Once | ORAL | Status: AC
  Administered 2014-11-27: 1 mg via ORAL
  Filled 2014-11-27: qty 1

## 2014-11-27 MED ORDER — HYDROXYZINE HCL 25 MG PO TABS: 25.0000 mg | ORAL_TABLET | Freq: Three times a day (TID) | ORAL | Status: DC | PRN

## 2014-11-27 MED ORDER — TRAZODONE HCL 150 MG PO TABS
150.0000 mg | ORAL_TABLET | Freq: Every day | ORAL | Status: DC
  Filled 2014-11-27: qty 14

## 2014-11-27 MED ORDER — CARBAMAZEPINE ER 200 MG PO TB12: 200.0000 mg | ORAL_TABLET | Freq: Two times a day (BID) | ORAL | Status: DC

## 2014-11-27 MED ORDER — SIMVASTATIN 20 MG PO TABS
20.0000 mg | ORAL_TABLET | Freq: Every day | ORAL | Status: DC
  Filled 2014-11-27: qty 14

## 2014-11-27 MED ORDER — GABAPENTIN 400 MG PO CAPS: 800.0000 mg | ORAL_CAPSULE | Freq: Four times a day (QID) | ORAL | Status: DC

## 2014-11-27 MED ORDER — HYDROCERIN EX CREA
TOPICAL_CREAM | Freq: Two times a day (BID) | CUTANEOUS | Status: DC
  Administered 2014-11-27: 17:00:00 via TOPICAL
  Filled 2014-11-27: qty 113

## 2014-11-27 MED ORDER — METFORMIN HCL 500 MG PO TABS: 500.0000 mg | ORAL_TABLET | Freq: Every day | ORAL | Status: DC

## 2014-11-27 MED ORDER — OLANZAPINE 7.5 MG PO TABS
7.5000 mg | ORAL_TABLET | Freq: Two times a day (BID) | ORAL | Status: DC
  Filled 2014-11-27: qty 28

## 2014-11-27 MED ORDER — WARFARIN SODIUM 7.5 MG PO TABS
7.5000 mg | ORAL_TABLET | Freq: Once | ORAL | Status: DC
  Filled 2014-11-27: qty 6

## 2014-11-27 MED ORDER — SUMATRIPTAN SUCCINATE 50 MG PO TABS: 50.0000 mg | ORAL_TABLET | ORAL | Status: DC | PRN

## 2014-11-27 MED ORDER — HYDROCORTISONE 0.5 % EX CREA: TOPICAL_CREAM | Freq: Two times a day (BID) | CUTANEOUS | Status: DC

## 2014-11-27 MED ORDER — WARFARIN SODIUM 7.5 MG PO TABS: 7.5000 mg | ORAL_TABLET | Freq: Every day | ORAL | Status: DC

## 2014-11-27 MED ORDER — OLANZAPINE 10 MG PO TABS
10.0000 mg | ORAL_TABLET | Freq: Every day | ORAL | Status: DC
  Filled 2014-11-27: qty 14

## 2014-11-27 MED ORDER — DOCUSATE SODIUM 100 MG PO CAPS
100.0000 mg | ORAL_CAPSULE | Freq: Two times a day (BID) | ORAL | Status: DC
  Filled 2014-11-27 (×3): qty 28

## 2014-11-27 MED ORDER — WARFARIN SODIUM 7.5 MG PO TABS
7.5000 mg | ORAL_TABLET | Freq: Once | ORAL | Status: DC
  Filled 2014-11-27: qty 14

## 2014-11-27 MED ORDER — HYDROCORTISONE 0.5 % EX CREA
TOPICAL_CREAM | Freq: Two times a day (BID) | CUTANEOUS | Status: DC
  Administered 2014-11-27: 17:00:00 via TOPICAL
  Filled 2014-11-27: qty 28.35

## 2014-11-27 MED ORDER — PANTOPRAZOLE SODIUM 40 MG PO TBEC
40.0000 mg | DELAYED_RELEASE_TABLET | Freq: Two times a day (BID) | ORAL | Status: DC
  Filled 2014-11-27 (×2): qty 28

## 2014-11-27 MED ORDER — PANTOPRAZOLE SODIUM 40 MG PO TBEC: 40.0000 mg | DELAYED_RELEASE_TABLET | Freq: Two times a day (BID) | ORAL | Status: DC

## 2014-11-27 MED ORDER — HYDROCERIN EX CREA: 1.0000 "application " | TOPICAL_CREAM | Freq: Two times a day (BID) | CUTANEOUS | Status: DC

## 2014-11-27 MED ORDER — DICYCLOMINE HCL 20 MG PO TABS: 20.0000 mg | ORAL_TABLET | Freq: Three times a day (TID) | ORAL | Status: DC

## 2014-11-27 MED ORDER — GABAPENTIN 800 MG PO TABS
800.0000 mg | ORAL_TABLET | Freq: Four times a day (QID) | ORAL | Status: DC
  Filled 2014-11-27 (×3): qty 56

## 2014-11-27 MED ORDER — TRAZODONE HCL 150 MG PO TABS: 150.0000 mg | ORAL_TABLET | Freq: Every day | ORAL | Status: DC

## 2014-11-27 NOTE — Progress Notes (Signed)
Patient attended N/A group tonight.  
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
## 2014-11-27 NOTE — [facility] Group Notes (Signed)
Late entry from 8/24:  [facility] LCSW Group Therapy 11/27/2014  1:15 PM Type of Therapy: Group Therapy Participation Level: Active  Participation Quality: Attentive, Sharing and Supportive  Affect: Appropriate  Cognitive: Alert and Oriented  Insight: Developing/Improving and Engaged  Engagement in Therapy: Developing/Improving and Engaged  Modes of Intervention: Clarification, Confrontation, Discussion, Education, Exploration, Limit-setting, Orientation, Problem-solving, Rapport Building, Dance movement psychotherapist, Socialization and Support  Summary of Progress/Problems: The topic for group today was emotional regulation. This group focused on both positive and negative emotion identification and allowed group members to process ways to identify feelings, regulate negative emotions, and find healthy ways to manage internal/external emotions. Group members were asked to reflect on a time when their reaction to an emotion led to a negative outcome and explored how alternative responses using emotion regulation would have benefited them. Group members were also asked to discuss a time when emotion regulation was utilized when a negative emotion was experienced.  Samuella Bruin, MSW, [employer] Clinical Social Worker [facility] [phone]

## 2014-11-27 NOTE — Progress Notes (Signed)
D- Patient is animated and is observed in the milieu interacting well with others.  Denies SI, HI, and AVH.  Patient has c/o of chronic right foot pain and a rash around his armpit.  Rash was evaluated by Nurse Practitioner and medication was prescribed.  On patient self-inventory sheet, he rates his depression "10", feelings of hopelessness "10", and his anxiety "8", with "10" being the worst.    A- PRN and scheduled medications administered to patient, per MD orders. Support and encouragement provided.  Routine safety checks conducted every 15 minutes.  Patient informed to notify staff with problems or concerns. R- No adverse drug reactions noted. Patient contracts for safety at this time. Patient compliant with medications and treatment plan. Patient receptive, calm, and cooperative. Patient remains safe at this time.
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
## 2014-11-27 NOTE — [facility] Group Notes (Signed)
   [facility] LCSW Aftercare Discharge Planning Group Note  11/27/2014  8:45 AM   Participation Quality: Alert, Appropriate and Oriented  Mood/Affect: Appropriate  Depression Rating: 6-7  Anxiety Rating: 7  Thoughts of Suicide: Pt denies SI/HI  Will you contract for safety? Yes  Current AVH: Pt denies  Plan for Discharge/Comments: Pt attended discharge planning group and actively participated in group. CSW provided pt with today's workbook. Patient reports feeling "good" today. He is agreeable to [facility] screening on 8/25.  Transportation Means: CSW assessing  Supports: No supports mentioned at this time  Samuella Bruin, MSW, [employer] Clinical Social Worker [employer] [phone]

## 2014-11-28 LAB — PROTIME-INR
INR: 2.46 — AB (ref 0.00–1.49)
Prothrombin Time: 26.4 seconds — ABNORMAL HIGH (ref 11.6–15.2)

## 2014-11-28 LAB — GLUCOSE, CAPILLARY: GLUCOSE-CAPILLARY: 117 mg/dL — AB (ref 65–99)

## 2014-11-28 NOTE — Progress Notes (Signed)
Recreation Therapy Notes  Date: 08.24.2016 Time: 9:30am Location: 300 Hall Group Room   Group Topic: Stress Management  Goal Area(s) Addresses:  Patient will actively participate in stress management techniques presented during session.   Behavioral Response: Engaged, Appropriate   Intervention: Stress management techniques  Activity :  Deep Breathing and Progressive Muscle Relaxation. LRT provided instruction and demonstration on practice of Progressive Muscle Relaxation. Technique was coupled with deep breathing.   Education:  Stress Management, Discharge Planning.   Education Outcome: Acknowledges edcuation  Clinical Observations/Feedback: Patient actively engaged in technique introduced during session, expressing no difficulty and demonstrating ability to practice independently post d/c.    [name] Iliza Blankenbeckler, LRT/CTRS  Abayomi Pattison L 11/28/2014 9:11 AM

## 2014-11-28 NOTE — Progress Notes (Signed)
DIS - CHARGE  NOTE  ---  Dis-charge pat. As ordered.  All possessions were returned.  Pts. " lost " wallet and cell phone were returned and in his possession when pt. Left [facility].  He was happy and was enroute to [facility] by taxie  #5 provided by [facility] through [employer]  Pt. Denied SI / HI / HA and stated no pain at DC.  --- A ---  Escort pt. To front lobby at 0745 hrs. , 11/28/14.  --- R ---  Pt. Was safe at time of DC

## 2014-11-28 NOTE — Progress Notes (Signed)
D: Pt informed the writer that he's glad his doctor "did a good job". Stated that he's glad he's going to rehab. "I'm going to [facility]". Pt stated he heard a lot of good things about [facility] and planned to take "one step at a time".   A:  Support and encouragement was offered. 15 min checks continued for safety.  R: Pt remains safe.

## 2014-11-28 NOTE — Progress Notes (Signed)
  [facility] Adult Case Management Discharge Plan :  Will you be returning to the same living situation after discharge:  No. Patient discharged to [facility] for admissions screening. At discharge, do you have transportation home?: Yes,  patient provided taxi voucher Do you have the ability to pay for your medications: Yes,  patient will be provided with prescriptions and medication samples  Release of information consent forms completed and in the chart;  Patient's signature needed at discharge.  Patient to Follow up at: Follow-up Information    Follow up with [facility]  On 11/28/2014.   Why:  Screening appointment on Thursday August 25th at 8am. Please bring your [place] ID and 2 weeks of medications. Call office if you need to reschedule.   Contact information:   [address] [place], [place] [address] Phone: [phone]      Patient denies SI/HI: Yes,  denies    Safety Planning and Suicide Prevention discussed: Yes,  with patient   Have you used any form of tobacco in the last 30 days? (Cigarettes, Smokeless Tobacco, Cigars, and/or Pipes): Patient Refused Screening  Has patient been referred to the Quitline?: Patient refused referral  Ramah Langhans, [place] 11/28/2014, 8:05 AM

## 2014-12-02 ENCOUNTER — Emergency Department (HOSPITAL_COMMUNITY)
Admission: EM | Admit: 2014-12-02 | Discharge: 2014-12-03 | Payer: Federal, State, Local not specified - Other | Attending: Emergency Medicine | Admitting: Emergency Medicine

## 2014-12-02 ENCOUNTER — Encounter (HOSPITAL_COMMUNITY): Payer: Self-pay

## 2014-12-02 DIAGNOSIS — R45850 Homicidal ideations: Secondary | ICD-10-CM

## 2014-12-02 DIAGNOSIS — Z720 Tobacco use: Secondary | ICD-10-CM | POA: Insufficient documentation

## 2014-12-02 DIAGNOSIS — Z87448 Personal history of other diseases of urinary system: Secondary | ICD-10-CM | POA: Insufficient documentation

## 2014-12-02 DIAGNOSIS — R1012 Left upper quadrant pain: Secondary | ICD-10-CM | POA: Insufficient documentation

## 2014-12-02 DIAGNOSIS — Z872 Personal history of diseases of the skin and subcutaneous tissue: Secondary | ICD-10-CM | POA: Insufficient documentation

## 2014-12-02 DIAGNOSIS — Z87828 Personal history of other (healed) physical injury and trauma: Secondary | ICD-10-CM | POA: Insufficient documentation

## 2014-12-02 DIAGNOSIS — Z79899 Other long term (current) drug therapy: Secondary | ICD-10-CM | POA: Insufficient documentation

## 2014-12-02 DIAGNOSIS — F1410 Cocaine abuse, uncomplicated: Secondary | ICD-10-CM | POA: Insufficient documentation

## 2014-12-02 DIAGNOSIS — F3164 Bipolar disorder, current episode mixed, severe, with psychotic features: Secondary | ICD-10-CM | POA: Diagnosis present

## 2014-12-02 DIAGNOSIS — R079 Chest pain, unspecified: Secondary | ICD-10-CM | POA: Insufficient documentation

## 2014-12-02 DIAGNOSIS — Z7901 Long term (current) use of anticoagulants: Secondary | ICD-10-CM | POA: Insufficient documentation

## 2014-12-02 DIAGNOSIS — Z86718 Personal history of other venous thrombosis and embolism: Secondary | ICD-10-CM | POA: Insufficient documentation

## 2014-12-02 DIAGNOSIS — I10 Essential (primary) hypertension: Secondary | ICD-10-CM | POA: Insufficient documentation

## 2014-12-02 DIAGNOSIS — F319 Bipolar disorder, unspecified: Secondary | ICD-10-CM | POA: Insufficient documentation

## 2014-12-02 DIAGNOSIS — F419 Anxiety disorder, unspecified: Secondary | ICD-10-CM | POA: Insufficient documentation

## 2014-12-02 DIAGNOSIS — R45851 Suicidal ideations: Secondary | ICD-10-CM

## 2014-12-02 DIAGNOSIS — R1013 Epigastric pain: Secondary | ICD-10-CM | POA: Insufficient documentation

## 2014-12-02 DIAGNOSIS — R1032 Left lower quadrant pain: Secondary | ICD-10-CM | POA: Insufficient documentation

## 2014-12-02 DIAGNOSIS — F1424 Cocaine dependence with cocaine-induced mood disorder: Secondary | ICD-10-CM | POA: Diagnosis present

## 2014-12-02 DIAGNOSIS — Z7952 Long term (current) use of systemic steroids: Secondary | ICD-10-CM | POA: Insufficient documentation

## 2014-12-02 HISTORY — DX: Post-traumatic stress disorder, unspecified: F43.10

## 2014-12-02 HISTORY — DX: Bipolar disorder, unspecified: F31.9

## 2014-12-02 LAB — APTT: APTT: 25 s (ref 24–37)

## 2014-12-02 LAB — I-STAT TROPONIN, ED: Troponin i, poc: 0 ng/mL (ref 0.00–0.08)

## 2014-12-02 LAB — COMPREHENSIVE METABOLIC PANEL
ALBUMIN: 3.5 g/dL (ref 3.5–5.0)
ALK PHOS: 75 U/L (ref 38–126)
ALT: 36 U/L (ref 17–63)
ANION GAP: 8 (ref 5–15)
AST: 24 U/L (ref 15–41)
BUN: 25 mg/dL — ABNORMAL HIGH (ref 6–20)
CALCIUM: 8.6 mg/dL — AB (ref 8.9–10.3)
CHLORIDE: 108 mmol/L (ref 101–111)
CO2: 25 mmol/L (ref 22–32)
Creatinine, Ser: 1.31 mg/dL — ABNORMAL HIGH (ref 0.61–1.24)
GFR calc Af Amer: >60 mL/min (ref >60)
GFR calc non Af Amer: >60 mL/min (ref >60)
GLUCOSE: 151 mg/dL — AB (ref 65–99)
Potassium: 3.2 mmol/L — ABNORMAL LOW (ref 3.5–5.1)
SODIUM: 141 mmol/L (ref 135–145)
Total Bilirubin: 0.6 mg/dL (ref 0.3–1.2)
Total Protein: 5.9 g/dL — ABNORMAL LOW (ref 6.5–8.1)

## 2014-12-02 LAB — CBC
HEMATOCRIT: 47 % (ref 39.0–52.0)
HEMOGLOBIN: 15.4 g/dL (ref 13.0–17.0)
MCH: 30.1 pg (ref 26.0–34.0)
MCHC: 32.8 g/dL (ref 30.0–36.0)
MCV: 91.8 fL (ref 78.0–100.0)
Platelets: 179 10*3/uL (ref 150–400)
RBC: 5.12 MIL/uL (ref 4.22–5.81)
RDW: 14 % (ref 11.5–15.5)
WBC: 5.4 10*3/uL (ref 4.0–10.5)

## 2014-12-02 LAB — PROTIME-INR
INR: 1.05 (ref 0.00–1.49)
Prothrombin Time: 13.9 seconds (ref 11.6–15.2)

## 2014-12-02 LAB — SALICYLATE LEVEL: Salicylate Lvl: <4 mg/dL (ref 2.8–30.0)

## 2014-12-02 LAB — ETHANOL: Alcohol, Ethyl (B): <5 mg/dL (ref <5)

## 2014-12-02 LAB — RAPID URINE DRUG SCREEN, HOSP PERFORMED
AMPHETAMINES: NOT DETECTED
BARBITURATES: NOT DETECTED
BENZODIAZEPINES: NOT DETECTED
COCAINE: POSITIVE — AB
OPIATES: NOT DETECTED
TETRAHYDROCANNABINOL: NOT DETECTED

## 2014-12-02 LAB — ACETAMINOPHEN LEVEL

## 2014-12-02 LAB — CBG MONITORING, ED: GLUCOSE-CAPILLARY: 126 mg/dL — AB (ref 65–99)

## 2014-12-02 MED ORDER — NICOTINE 21 MG/24HR TD PT24: 21.0000 mg | MEDICATED_PATCH | Freq: Every day | TRANSDERMAL | Status: DC

## 2014-12-02 MED ORDER — PANTOPRAZOLE SODIUM 40 MG PO TBEC
40.0000 mg | DELAYED_RELEASE_TABLET | Freq: Two times a day (BID) | ORAL | Status: DC
  Administered 2014-12-02 – 2014-12-03 (×2): 40 mg via ORAL
  Filled 2014-12-02 (×3): qty 1

## 2014-12-02 MED ORDER — WARFARIN - PHYSICIAN DOSING INPATIENT: Freq: Every day | Status: DC

## 2014-12-02 MED ORDER — ONDANSETRON HCL 4 MG PO TABS
4.0000 mg | ORAL_TABLET | Freq: Three times a day (TID) | ORAL | Status: DC | PRN
  Administered 2014-12-02: 4 mg via ORAL
  Filled 2014-12-02: qty 1

## 2014-12-02 MED ORDER — ACETAMINOPHEN 325 MG PO TABS
650.0000 mg | ORAL_TABLET | ORAL | Status: DC | PRN
  Administered 2014-12-02: 650 mg via ORAL
  Filled 2014-12-02: qty 2

## 2014-12-02 MED ORDER — TRAZODONE HCL 50 MG PO TABS
150.0000 mg | ORAL_TABLET | Freq: Every day | ORAL | Status: DC
  Administered 2014-12-02: 150 mg via ORAL
  Filled 2014-12-02 (×2): qty 1

## 2014-12-02 MED ORDER — ZOLPIDEM TARTRATE 5 MG PO TABS
5.0000 mg | ORAL_TABLET | Freq: Every evening | ORAL | Status: DC | PRN
Start: 2014-12-02 — End: 2014-12-03

## 2014-12-02 MED ORDER — WARFARIN SODIUM 7.5 MG PO TABS
7.5000 mg | ORAL_TABLET | Freq: Every day | ORAL | Status: DC
  Administered 2014-12-02 – 2014-12-03 (×2): 7.5 mg via ORAL
  Filled 2014-12-02 (×2): qty 1

## 2014-12-02 MED ORDER — LORAZEPAM 1 MG PO TABS
1.0000 mg | ORAL_TABLET | Freq: Three times a day (TID) | ORAL | Status: DC | PRN
  Administered 2014-12-02: 1 mg via ORAL
  Filled 2014-12-02: qty 1

## 2014-12-02 MED ORDER — METFORMIN HCL 500 MG PO TABS
500.0000 mg | ORAL_TABLET | Freq: Every day | ORAL | Status: DC
  Administered 2014-12-03: 500 mg via ORAL
  Filled 2014-12-02 (×2): qty 1
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
## 2014-12-02 NOTE — BH Assessment (Signed)
Inpt recommended. TTS seeking placement. Referrals sent to: Cleotis Lema, [facility] [address], [place], [place] Triage Specialist 12/02/2014 10:20 PM

## 2014-12-02 NOTE — ED Notes (Signed)
1 pt belonging bag in locker #29

## 2014-12-02 NOTE — Treatment Plan (Signed)
Pt just completed a 17 day stay at [facility] culminating in a residential placement at [facility] beginning on 8/25 that should have lasted at least 2 weeks.  If pt requires further hospitalization it will need to be at a long term facility like [facility].

## 2014-12-02 NOTE — ED Notes (Signed)
Patient appears flat, complaining of abd and foot pain. Rates anxiety 9/10, depression 10/10. Reports SI, HI, AVH as passive; not present at this time.  Encouragement offered. Environment adjusted. Given Tylenol, ATivan, Zofran, Protonix.  Q 15 safety checks continue.
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
## 2014-12-03 DIAGNOSIS — F3164 Bipolar disorder, current episode mixed, severe, with psychotic features: Secondary | ICD-10-CM

## 2014-12-03 DIAGNOSIS — F1490 Cocaine use, unspecified, uncomplicated: Secondary | ICD-10-CM

## 2014-12-03 MED ORDER — HYDROXYZINE HCL 25 MG PO TABS
25.0000 mg | ORAL_TABLET | Freq: Three times a day (TID) | ORAL | Status: DC | PRN
  Filled 2014-12-03: qty 1

## 2014-12-03 MED ORDER — PRAZOSIN HCL 2 MG PO CAPS
2.0000 mg | ORAL_CAPSULE | Freq: Every day | ORAL | Status: DC
  Filled 2014-12-03: qty 1

## 2014-12-03 MED ORDER — OLANZAPINE 10 MG PO TABS: 10.0000 mg | ORAL_TABLET | Freq: Every day | ORAL | Status: DC

## 2014-12-03 MED ORDER — CARBAMAZEPINE ER 200 MG PO TB12
200.0000 mg | ORAL_TABLET | Freq: Two times a day (BID) | ORAL | Status: DC
  Administered 2014-12-03: 200 mg via ORAL
  Filled 2014-12-03 (×2): qty 1

## 2014-12-03 NOTE — BH Assessment (Signed)
[facility] Assessment Progress Note  The following facilities have been contacted to seek placement for this pt, with results as noted:  Beds available, information sent, decision pending:  [facility] Done [facility] [place]   At capacity:  [facility] [place] [facility] [place]   Declined:  Crystal   Doylene Canning, [place] Triage Specialist [phone]
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
## 2014-12-03 NOTE — ED Notes (Signed)
Pt is very irritable.  Not interested in speaking with staff.  He claims he has  Off and on suicidal ideation.  He claims he has no plan.  15 minute checks in place.

## 2014-12-03 NOTE — ED Notes (Signed)
Patient irritable. Reports AH with commands to harm self and others. Patient lies in bed with his back to the writer. C/o pain in right leg and foot. C/o of pain in abdomen. C/o pain in right arm. Declines updated set of vital signs. Declines Tylenol, Vistaril, and environmental intervention for comfort.  Encouragement offered. Made patient aware that he would be transferred to [facility] for treatment.   Q 15 safety checks continue.

## 2014-12-03 NOTE — Plan of Care (Signed)
Report called to [phone] [facility] RN Patient sent via sheriff to [facility] [address], [place] [facility].

## 2014-12-03 NOTE — BH Assessment (Signed)
Patient accepted to [facility], per [place]. The accepting provider is Dr. Deborra Medina. Room assignment is 424-B. Nursing report [phone]. Patient's nurse Kendal Hymen) made aware of patient's disposition.

## 2015-03-28 NOTE — Progress Notes (Signed)
I am closing these encounters as I am responsible for the [facility] now and the provider at this time is no longer employed at our facility to close them. I did not provide this service on this date.   Rushawn Capshaw Karl, PharmD, BCPS, CPP Clinical Pharmacist Practitioner  [facility] [phone]  

## 2015-03-28 NOTE — Telephone Encounter (Signed)
I am closing these encounters as the provider at this time is no longer employed at our facility to close them. I did not provide this service on this date.   Stacey Karl, PharmD, BCPS, CPP Clinical Pharmacist Practitioner  [facility] [phone]  

## 2015-03-28 NOTE — Progress Notes (Signed)
I am closing these encounters as I am responsible for the Coumadin Clinic now and the provider at this time is no longer employed at our facility to close them. I did not provide this service on this date.   Stacey Karl, PharmD, BCPS, CPP Clinical Pharmacist Practitioner  [facility] [phone]  

## 2015-03-28 NOTE — Telephone Encounter (Signed)
I am closing these encounters as the provider at this time is no longer employed at our facility to close them. I did not provide this service on this date.   Serita Degroote Karl, PharmD, BCPS, CPP Clinical Pharmacist Practitioner  [facility] [phone]  

## 2015-03-28 NOTE — Progress Notes (Signed)
I am closing these encounters as I am responsible for the [facility]madin Clinic now and the provider at this time is no longer employed at our facility to close them. I did not provide this service on this date.   Ulla Mckiernan Karl, PharmD, BCPS, CPP Clinical Pharmacist Practitioner  [facility] [phone]  

## 2018-02-24 ENCOUNTER — Emergency Department (HOSPITAL_COMMUNITY): Payer: Self-pay

## 2018-02-24 ENCOUNTER — Encounter (HOSPITAL_COMMUNITY): Payer: Self-pay

## 2018-02-24 ENCOUNTER — Emergency Department (HOSPITAL_COMMUNITY)
Admission: EM | Admit: 2018-02-24 | Discharge: 2018-02-24 | Disposition: A | Discharge status: Home / Self Care | Payer: Self-pay | Attending: Emergency Medicine | Admitting: Emergency Medicine

## 2018-02-24 DIAGNOSIS — I10 Essential (primary) hypertension: Secondary | ICD-10-CM | POA: Insufficient documentation

## 2018-02-24 DIAGNOSIS — R1084 Generalized abdominal pain: Secondary | ICD-10-CM | POA: Insufficient documentation

## 2018-02-24 DIAGNOSIS — F17210 Nicotine dependence, cigarettes, uncomplicated: Secondary | ICD-10-CM | POA: Insufficient documentation

## 2018-02-24 DIAGNOSIS — Z79899 Other long term (current) drug therapy: Secondary | ICD-10-CM | POA: Insufficient documentation

## 2018-02-24 LAB — URINALYSIS, ROUTINE W REFLEX MICROSCOPIC
BILIRUBIN URINE: NEGATIVE
Glucose, UA: NEGATIVE mg/dL
Hgb urine dipstick: NEGATIVE
KETONES UR: 20 mg/dL — AB
Leukocytes, UA: NEGATIVE
NITRITE: NEGATIVE
Protein, ur: NEGATIVE mg/dL
Specific Gravity, Urine: 1.02 (ref 1.005–1.030)
pH: 5 (ref 5.0–8.0)

## 2018-02-24 LAB — CBG MONITORING, ED
GLUCOSE-CAPILLARY: 66 mg/dL — AB (ref 70–99)
GLUCOSE-CAPILLARY: 99 mg/dL (ref 70–99)

## 2018-02-24 LAB — CBC WITH DIFFERENTIAL/PLATELET
Abs Immature Granulocytes: 0.02 10*3/uL (ref 0.00–0.07)
BASOS ABS: 0 10*3/uL (ref 0.0–0.1)
BASOS PCT: 1 %
EOS ABS: 0 10*3/uL (ref 0.0–0.5)
Eosinophils Relative: 1 %
HCT: 47.7 % (ref 39.0–52.0)
Hemoglobin: 14.9 g/dL (ref 13.0–17.0)
Immature Granulocytes: 0 %
Lymphocytes Relative: 22 %
Lymphs Abs: 1.4 10*3/uL (ref 0.7–4.0)
MCH: 28 pg (ref 26.0–34.0)
MCHC: 31.2 g/dL (ref 30.0–36.0)
MCV: 89.5 fL (ref 80.0–100.0)
Monocytes Absolute: 0.9 10*3/uL (ref 0.1–1.0)
Monocytes Relative: 13 %
NEUTROS ABS: 4.2 10*3/uL (ref 1.7–7.7)
NEUTROS PCT: 63 %
NRBC: 0 % (ref 0.0–0.2)
PLATELETS: 229 10*3/uL (ref 150–400)
RBC: 5.33 MIL/uL (ref 4.22–5.81)
RDW: 14.6 % (ref 11.5–15.5)
WBC: 6.6 10*3/uL (ref 4.0–10.5)

## 2018-02-24 LAB — COMPREHENSIVE METABOLIC PANEL
ALBUMIN: 4.2 g/dL (ref 3.5–5.0)
ALT: 22 U/L (ref 0–44)
ANION GAP: 10 (ref 5–15)
AST: 40 U/L (ref 15–41)
Alkaline Phosphatase: 61 U/L (ref 38–126)
BUN: 11 mg/dL (ref 6–20)
CALCIUM: 9.8 mg/dL (ref 8.9–10.3)
CO2: 23 mmol/L (ref 22–32)
Chloride: 108 mmol/L (ref 98–111)
Creatinine, Ser: 1.38 mg/dL — ABNORMAL HIGH (ref 0.61–1.24)
GFR, EST NON AFRICAN AMERICAN: 58 mL/min — AB (ref >60)
Glucose, Bld: 75 mg/dL (ref 70–99)
Potassium: 4.3 mmol/L (ref 3.5–5.1)
SODIUM: 141 mmol/L (ref 135–145)
TOTAL PROTEIN: 7.1 g/dL (ref 6.5–8.1)
Total Bilirubin: 0.8 mg/dL (ref 0.3–1.2)

## 2018-02-24 LAB — RAPID URINE DRUG SCREEN, HOSP PERFORMED
AMPHETAMINES: NOT DETECTED
BARBITURATES: NOT DETECTED
Benzodiazepines: NOT DETECTED
Cocaine: POSITIVE — AB
Opiates: NOT DETECTED
Tetrahydrocannabinol: NOT DETECTED

## 2018-02-24 LAB — LIPASE, BLOOD: Lipase: 54 U/L — ABNORMAL HIGH (ref 11–51)

## 2018-02-24 LAB — I-STAT TROPONIN, ED
TROPONIN I, POC: 0.01 ng/mL (ref 0.00–0.08)
Troponin i, poc: 0.03 ng/mL (ref 0.00–0.08)

## 2018-02-24 MED ORDER — DEXTROSE 50 % IV SOLN
INTRAVENOUS | Status: AC
  Administered 2018-02-24: 50 mL
  Filled 2018-02-24: qty 50

## 2018-02-24 MED ORDER — ONDANSETRON 4 MG PO TBDP: 4.0000 mg | ORAL_TABLET | Freq: Three times a day (TID) | ORAL | 0 refills | Status: DC | PRN

## 2018-02-24 MED ORDER — OMEPRAZOLE 20 MG PO CPDR: 20.0000 mg | DELAYED_RELEASE_CAPSULE | Freq: Every day | ORAL | 0 refills | Status: DC

## 2018-02-24 MED ORDER — IOHEXOL 300 MG/ML  SOLN
100.0000 mL | Freq: Once | INTRAMUSCULAR | Status: AC | PRN
  Administered 2018-02-24: 100 mL via INTRAVENOUS

## 2018-02-24 MED ORDER — SODIUM CHLORIDE 0.9 % IV BOLUS
1000.0000 mL | Freq: Once | INTRAVENOUS | Status: AC
  Administered 2018-02-24: 1000 mL via INTRAVENOUS

## 2018-02-24 MED ORDER — MORPHINE SULFATE (PF) 4 MG/ML IV SOLN
4.0000 mg | Freq: Once | INTRAVENOUS | Status: AC
  Administered 2018-02-24: 4 mg via INTRAVENOUS
  Filled 2018-02-24: qty 1

## 2018-02-24 MED ORDER — ONDANSETRON HCL 4 MG/2ML IJ SOLN
4.0000 mg | Freq: Once | INTRAMUSCULAR | Status: AC
  Administered 2018-02-24: 4 mg via INTRAVENOUS
  Filled 2018-02-24: qty 2

## 2018-02-24 NOTE — ED Triage Notes (Addendum)
Pt states that he has Crohn's and feels that he is having a "flare-up" has he is having unontrolled bowel movements; pt also c/o of increased urination, intermittent CP and incontinence of B/B. Pt states chest pain comes and goes. Pt states that he is a diabetic as well.

## 2018-02-24 NOTE — ED Notes (Signed)
Pt reports he took three "caffeine pills and drank a coffee" around 9:30 last night.  "when I came down from that I did it again around 1:30am.  I got jittery and felt like I was on speed.  That's when I came here".

## 2018-02-24 NOTE — ED Notes (Signed)
Pt CBG was 66, notified PA & Tray(RN)

## 2018-02-24 NOTE — ED Provider Notes (Signed)
Received patient at signout from [facility].  Refer to provider note for full history and physical examination.  Briefly, patient is a 52 year old male with history of Crohn's disease and diabetes presenting for evaluation of abdominal pain.  Work-up thus far has been reassuring, CT scan suggestive of gastritis.  Patient was about to be discharged when he began complaining of chest pain and appeared uncomfortable and somewhat diaphoretic.  Initial troponin is negative.  Awaiting second troponin.  If second troponin is negative, likely stable for discharge home PCP follow-up.  MDM  7:41 AM Second troponin is negative. Requesting breakfast tray. stable for discharge home.        Jeanie Sewer, PA-C 02/24/18 1730    Ward, Layla Maw, DO 02/24/18 2357

## 2018-02-24 NOTE — ED Notes (Signed)
Patient transported to CT 

## 2018-02-24 NOTE — ED Notes (Signed)
Pt discharged from ED; instructions provided and scripts given; Pt encouraged to return to ED if symptoms worsen and to f/u with PCP; Pt verbalized understanding of all instructions 
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
## 2018-02-24 NOTE — ED Notes (Signed)
Pt CBG was 99, notified Jennifer(RN)

## 2018-02-26 ENCOUNTER — Emergency Department (HOSPITAL_COMMUNITY): Payer: Self-pay

## 2018-02-26 ENCOUNTER — Observation Stay (HOSPITAL_COMMUNITY)
Admission: EM | Admit: 2018-02-26 | Discharge: 2018-02-28 | Disposition: A | Discharge status: Home / Self Care | Payer: Self-pay | Attending: Internal Medicine | Admitting: Internal Medicine

## 2018-02-26 ENCOUNTER — Encounter (HOSPITAL_COMMUNITY): Payer: Self-pay | Admitting: Emergency Medicine

## 2018-02-26 DIAGNOSIS — F1424 Cocaine dependence with cocaine-induced mood disorder: Secondary | ICD-10-CM | POA: Insufficient documentation

## 2018-02-26 DIAGNOSIS — S301XXA Contusion of abdominal wall, initial encounter: Secondary | ICD-10-CM | POA: Insufficient documentation

## 2018-02-26 DIAGNOSIS — S61411A Laceration without foreign body of right hand, initial encounter: Secondary | ICD-10-CM | POA: Insufficient documentation

## 2018-02-26 DIAGNOSIS — M6282 Rhabdomyolysis: Secondary | ICD-10-CM | POA: Insufficient documentation

## 2018-02-26 DIAGNOSIS — Z7984 Long term (current) use of oral hypoglycemic drugs: Secondary | ICD-10-CM | POA: Insufficient documentation

## 2018-02-26 DIAGNOSIS — F1420 Cocaine dependence, uncomplicated: Secondary | ICD-10-CM | POA: Diagnosis present

## 2018-02-26 DIAGNOSIS — E872 Acidosis, unspecified: Secondary | ICD-10-CM

## 2018-02-26 DIAGNOSIS — F17210 Nicotine dependence, cigarettes, uncomplicated: Secondary | ICD-10-CM | POA: Insufficient documentation

## 2018-02-26 DIAGNOSIS — Z86718 Personal history of other venous thrombosis and embolism: Secondary | ICD-10-CM | POA: Insufficient documentation

## 2018-02-26 DIAGNOSIS — R079 Chest pain, unspecified: Secondary | ICD-10-CM

## 2018-02-26 DIAGNOSIS — Z7901 Long term (current) use of anticoagulants: Secondary | ICD-10-CM | POA: Insufficient documentation

## 2018-02-26 DIAGNOSIS — N179 Acute kidney failure, unspecified: Secondary | ICD-10-CM | POA: Insufficient documentation

## 2018-02-26 DIAGNOSIS — R7401 Elevation of levels of liver transaminase levels: Secondary | ICD-10-CM

## 2018-02-26 DIAGNOSIS — R740 Nonspecific elevation of levels of transaminase and lactic acid dehydrogenase [LDH]: Secondary | ICD-10-CM

## 2018-02-26 DIAGNOSIS — E860 Dehydration: Secondary | ICD-10-CM | POA: Insufficient documentation

## 2018-02-26 DIAGNOSIS — S0081XA Abrasion of other part of head, initial encounter: Secondary | ICD-10-CM | POA: Insufficient documentation

## 2018-02-26 DIAGNOSIS — N183 Chronic kidney disease, stage 3 (moderate): Secondary | ICD-10-CM | POA: Insufficient documentation

## 2018-02-26 DIAGNOSIS — S20212A Contusion of left front wall of thorax, initial encounter: Secondary | ICD-10-CM | POA: Insufficient documentation

## 2018-02-26 DIAGNOSIS — F1010 Alcohol abuse, uncomplicated: Secondary | ICD-10-CM | POA: Insufficient documentation

## 2018-02-26 DIAGNOSIS — Z87820 Personal history of traumatic brain injury: Secondary | ICD-10-CM | POA: Insufficient documentation

## 2018-02-26 DIAGNOSIS — F4310 Post-traumatic stress disorder, unspecified: Secondary | ICD-10-CM | POA: Diagnosis present

## 2018-02-26 DIAGNOSIS — F1414 Cocaine abuse with cocaine-induced mood disorder: Secondary | ICD-10-CM | POA: Diagnosis present

## 2018-02-26 DIAGNOSIS — F1020 Alcohol dependence, uncomplicated: Secondary | ICD-10-CM | POA: Diagnosis present

## 2018-02-26 DIAGNOSIS — R0789 Other chest pain: Principal | ICD-10-CM | POA: Insufficient documentation

## 2018-02-26 DIAGNOSIS — I8290 Acute embolism and thrombosis of unspecified vein: Secondary | ICD-10-CM | POA: Diagnosis present

## 2018-02-26 DIAGNOSIS — I129 Hypertensive chronic kidney disease with stage 1 through stage 4 chronic kidney disease, or unspecified chronic kidney disease: Secondary | ICD-10-CM | POA: Insufficient documentation

## 2018-02-26 DIAGNOSIS — Z79899 Other long term (current) drug therapy: Secondary | ICD-10-CM | POA: Insufficient documentation

## 2018-02-26 LAB — CBC WITH DIFFERENTIAL/PLATELET
Abs Immature Granulocytes: 0.06 10*3/uL (ref 0.00–0.07)
BASOS ABS: 0 10*3/uL (ref 0.0–0.1)
BASOS PCT: 0 %
Eosinophils Absolute: 0.1 10*3/uL (ref 0.0–0.5)
Eosinophils Relative: 1 %
HEMATOCRIT: 43.6 % (ref 39.0–52.0)
Hemoglobin: 14 g/dL (ref 13.0–17.0)
IMMATURE GRANULOCYTES: 1 %
LYMPHS ABS: 0.7 10*3/uL (ref 0.7–4.0)
Lymphocytes Relative: 9 %
MCH: 27.8 pg (ref 26.0–34.0)
MCHC: 32.1 g/dL (ref 30.0–36.0)
MCV: 86.5 fL (ref 80.0–100.0)
Monocytes Absolute: 0.6 10*3/uL (ref 0.1–1.0)
Monocytes Relative: 8 %
NEUTROS PCT: 81 %
NRBC: 0 % (ref 0.0–0.2)
Neutro Abs: 6.4 10*3/uL (ref 1.7–7.7)
PLATELETS: 258 10*3/uL (ref 150–400)
RBC: 5.04 MIL/uL (ref 4.22–5.81)
RDW: 14.5 % (ref 11.5–15.5)
WBC: 7.9 10*3/uL (ref 4.0–10.5)

## 2018-02-26 LAB — I-STAT TROPONIN, ED: TROPONIN I, POC: 0.01 ng/mL (ref 0.00–0.08)

## 2018-02-26 LAB — PROTIME-INR
INR: 1.63
Prothrombin Time: 19.1 seconds — ABNORMAL HIGH (ref 11.4–15.2)

## 2018-02-26 MED ORDER — FENTANYL CITRATE (PF) 100 MCG/2ML IJ SOLN
50.0000 ug | Freq: Once | INTRAMUSCULAR | Status: AC
  Administered 2018-02-27: 50 ug via INTRAVENOUS
  Filled 2018-02-26: qty 2
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
## 2018-02-27 ENCOUNTER — Emergency Department (HOSPITAL_COMMUNITY): Payer: Self-pay

## 2018-02-27 ENCOUNTER — Other Ambulatory Visit: Payer: Self-pay

## 2018-02-27 DIAGNOSIS — I8290 Acute embolism and thrombosis of unspecified vein: Secondary | ICD-10-CM | POA: Diagnosis present

## 2018-02-27 DIAGNOSIS — F1414 Cocaine abuse with cocaine-induced mood disorder: Secondary | ICD-10-CM | POA: Diagnosis present

## 2018-02-27 DIAGNOSIS — F1420 Cocaine dependence, uncomplicated: Secondary | ICD-10-CM

## 2018-02-27 DIAGNOSIS — N179 Acute kidney failure, unspecified: Secondary | ICD-10-CM | POA: Diagnosis present

## 2018-02-27 DIAGNOSIS — F1020 Alcohol dependence, uncomplicated: Secondary | ICD-10-CM

## 2018-02-27 DIAGNOSIS — F1010 Alcohol abuse, uncomplicated: Secondary | ICD-10-CM

## 2018-02-27 LAB — COMPREHENSIVE METABOLIC PANEL
ALK PHOS: 56 U/L (ref 38–126)
ALT: 50 U/L — ABNORMAL HIGH (ref 0–44)
ALT: 59 U/L — AB (ref 0–44)
ANION GAP: 13 (ref 5–15)
AST: 106 U/L — ABNORMAL HIGH (ref 15–41)
AST: 82 U/L — AB (ref 15–41)
Albumin: 3.2 g/dL — ABNORMAL LOW (ref 3.5–5.0)
Albumin: 4 g/dL (ref 3.5–5.0)
Alkaline Phosphatase: 50 U/L (ref 38–126)
Anion gap: 6 (ref 5–15)
BILIRUBIN TOTAL: 0.8 mg/dL (ref 0.3–1.2)
BILIRUBIN TOTAL: 1.1 mg/dL (ref 0.3–1.2)
BUN: 24 mg/dL — AB (ref 6–20)
BUN: 29 mg/dL — AB (ref 6–20)
CHLORIDE: 103 mmol/L (ref 98–111)
CO2: 23 mmol/L (ref 22–32)
CO2: 17 mmol/L — AB (ref 22–32)
CREATININE: 2.48 mg/dL — AB (ref 0.61–1.24)
Calcium: 7.9 mg/dL — ABNORMAL LOW (ref 8.9–10.3)
Calcium: 9.4 mg/dL (ref 8.9–10.3)
Chloride: 111 mmol/L (ref 98–111)
Creatinine, Ser: 1.86 mg/dL — ABNORMAL HIGH (ref 0.61–1.24)
GFR calc Af Amer: 47 mL/min — ABNORMAL LOW (ref >60)
GFR calc non Af Amer: 28 mL/min — ABNORMAL LOW (ref >60)
GFR, EST AFRICAN AMERICAN: 33 mL/min — AB (ref >60)
GFR, EST NON AFRICAN AMERICAN: 40 mL/min — AB (ref >60)
Glucose, Bld: 106 mg/dL — ABNORMAL HIGH (ref 70–99)
Glucose, Bld: 155 mg/dL — ABNORMAL HIGH (ref 70–99)
POTASSIUM: 4.1 mmol/L (ref 3.5–5.1)
Potassium: 3.9 mmol/L (ref 3.5–5.1)
SODIUM: 133 mmol/L — AB (ref 135–145)
Sodium: 140 mmol/L (ref 135–145)
TOTAL PROTEIN: 5.7 g/dL — AB (ref 6.5–8.1)
Total Protein: 6.9 g/dL (ref 6.5–8.1)

## 2018-02-27 LAB — CBC
HCT: 37.9 % — ABNORMAL LOW (ref 39.0–52.0)
HEMOGLOBIN: 12.3 g/dL — AB (ref 13.0–17.0)
MCH: 28 pg (ref 26.0–34.0)
MCHC: 32.5 g/dL (ref 30.0–36.0)
MCV: 86.3 fL (ref 80.0–100.0)
PLATELETS: 217 10*3/uL (ref 150–400)
RBC: 4.39 MIL/uL (ref 4.22–5.81)
RDW: 14.6 % (ref 11.5–15.5)
WBC: 8.7 10*3/uL (ref 4.0–10.5)
nRBC: 0 % (ref 0.0–0.2)

## 2018-02-27 LAB — URINALYSIS, ROUTINE W REFLEX MICROSCOPIC
BILIRUBIN URINE: NEGATIVE
Glucose, UA: NEGATIVE mg/dL
Ketones, ur: 20 mg/dL — AB
Nitrite: NEGATIVE
Protein, ur: 30 mg/dL — AB
Specific Gravity, Urine: 1.035 — ABNORMAL HIGH (ref 1.005–1.030)
pH: 5 (ref 5.0–8.0)

## 2018-02-27 LAB — CK: Total CK: 4041 U/L — ABNORMAL HIGH (ref 49–397)

## 2018-02-27 LAB — CK TOTAL AND CKMB (NOT AT ARMC)
CK, MB: 12 ng/mL — ABNORMAL HIGH (ref 0.5–5.0)
RELATIVE INDEX: 0.3 (ref 0.0–2.5)
Total CK: 4615 U/L — ABNORMAL HIGH (ref 49–397)

## 2018-02-27 LAB — MAGNESIUM: MAGNESIUM: 2.8 mg/dL — AB (ref 1.7–2.4)

## 2018-02-27 LAB — RAPID URINE DRUG SCREEN, HOSP PERFORMED
Amphetamines: NOT DETECTED
Barbiturates: NOT DETECTED
Benzodiazepines: NOT DETECTED
COCAINE: POSITIVE — AB
OPIATES: NOT DETECTED
TETRAHYDROCANNABINOL: NOT DETECTED

## 2018-02-27 LAB — SAMPLE TO BLOOD BANK

## 2018-02-27 LAB — ETHANOL

## 2018-02-27 LAB — GLUCOSE, CAPILLARY: GLUCOSE-CAPILLARY: 108 mg/dL — AB (ref 70–99)

## 2018-02-27 MED ORDER — FOLIC ACID 1 MG PO TABS
1.0000 mg | ORAL_TABLET | Freq: Every day | ORAL | Status: DC
  Administered 2018-02-27 – 2018-02-28 (×2): 1 mg via ORAL
  Filled 2018-02-27 (×2): qty 1

## 2018-02-27 MED ORDER — SODIUM CHLORIDE 0.9 % IV BOLUS
1000.0000 mL | Freq: Once | INTRAVENOUS | Status: AC
  Administered 2018-02-27: 1000 mL via INTRAVENOUS

## 2018-02-27 MED ORDER — CHLORDIAZEPOXIDE HCL 5 MG PO CAPS
15.0000 mg | ORAL_CAPSULE | Freq: Three times a day (TID) | ORAL | Status: DC
  Administered 2018-02-27 – 2018-02-28 (×3): 15 mg via ORAL
  Filled 2018-02-27 (×3): qty 3

## 2018-02-27 MED ORDER — HEPARIN SODIUM (PORCINE) 5000 UNIT/ML IJ SOLN
5000.0000 [IU] | Freq: Three times a day (TID) | INTRAMUSCULAR | Status: DC
  Administered 2018-02-27 – 2018-02-28 (×3): 5000 [IU] via SUBCUTANEOUS
  Filled 2018-02-27 (×3): qty 1

## 2018-02-27 MED ORDER — THIAMINE HCL 100 MG/ML IJ SOLN
Freq: Once | INTRAVENOUS | Status: AC
  Administered 2018-02-27: 07:00:00 via INTRAVENOUS
  Filled 2018-02-27: qty 1000

## 2018-02-27 MED ORDER — ACETAMINOPHEN 650 MG RE SUPP: 650.0000 mg | Freq: Four times a day (QID) | RECTAL | Status: DC | PRN

## 2018-02-27 MED ORDER — LACTATED RINGERS IV SOLN
INTRAVENOUS | Status: AC
  Administered 2018-02-27 (×2): via INTRAVENOUS

## 2018-02-27 MED ORDER — LORAZEPAM 2 MG/ML IJ SOLN
1.0000 mg | INTRAMUSCULAR | Status: DC | PRN
  Administered 2018-02-27: 1 mg via INTRAVENOUS
  Filled 2018-02-27: qty 1

## 2018-02-27 MED ORDER — IOHEXOL 300 MG/ML  SOLN
100.0000 mL | Freq: Once | INTRAMUSCULAR | Status: AC | PRN
  Administered 2018-02-27: 100 mL via INTRAVENOUS

## 2018-02-27 MED ORDER — THIAMINE HCL 100 MG/ML IJ SOLN
100.0000 mg | Freq: Every day | INTRAMUSCULAR | Status: DC
  Administered 2018-02-27 – 2018-02-28 (×2): 100 mg via INTRAVENOUS
  Filled 2018-02-27 (×2): qty 2

## 2018-02-27 MED ORDER — ACETAMINOPHEN 325 MG PO TABS
650.0000 mg | ORAL_TABLET | Freq: Four times a day (QID) | ORAL | Status: DC | PRN
  Administered 2018-02-27 – 2018-02-28 (×2): 650 mg via ORAL
  Filled 2018-02-27 (×3): qty 2

## 2018-02-27 NOTE — ED Notes (Signed)
Pt resting on stretcher with eyes closed, RR even and unlabored, NAD 

## 2018-02-27 NOTE — ED Notes (Signed)
Dr. Preston Fleeting at bedside for wound repair
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
## 2018-02-27 NOTE — ED Notes (Signed)
Pt rambling, holding phone to ear and conversation with no one on the line. Pt appears very anxious, moving legs  continuously on stretcher.
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
## 2018-02-28 ENCOUNTER — Inpatient Hospital Stay (HOSPITAL_COMMUNITY)
Admission: AD | Admit: 2018-02-28 | Discharge: 2018-03-08 | DRG: 885 | Disposition: A | Discharge status: Home / Self Care | Payer: Federal, State, Local not specified - Other | Attending: Psychiatry | Admitting: Psychiatry

## 2018-02-28 ENCOUNTER — Other Ambulatory Visit: Payer: Self-pay

## 2018-02-28 ENCOUNTER — Encounter (HOSPITAL_COMMUNITY): Payer: Self-pay | Admitting: *Deleted

## 2018-02-28 DIAGNOSIS — G4700 Insomnia, unspecified: Secondary | ICD-10-CM | POA: Diagnosis present

## 2018-02-28 DIAGNOSIS — Z7984 Long term (current) use of oral hypoglycemic drugs: Secondary | ICD-10-CM

## 2018-02-28 DIAGNOSIS — F4310 Post-traumatic stress disorder, unspecified: Secondary | ICD-10-CM | POA: Diagnosis present

## 2018-02-28 DIAGNOSIS — F1410 Cocaine abuse, uncomplicated: Secondary | ICD-10-CM | POA: Diagnosis present

## 2018-02-28 DIAGNOSIS — F17210 Nicotine dependence, cigarettes, uncomplicated: Secondary | ICD-10-CM | POA: Diagnosis present

## 2018-02-28 DIAGNOSIS — Z79899 Other long term (current) drug therapy: Secondary | ICD-10-CM

## 2018-02-28 DIAGNOSIS — E1140 Type 2 diabetes mellitus with diabetic neuropathy, unspecified: Secondary | ICD-10-CM | POA: Diagnosis present

## 2018-02-28 DIAGNOSIS — Z560 Unemployment, unspecified: Secondary | ICD-10-CM

## 2018-02-28 DIAGNOSIS — Z9114 Patient's other noncompliance with medication regimen: Secondary | ICD-10-CM

## 2018-02-28 DIAGNOSIS — Z7901 Long term (current) use of anticoagulants: Secondary | ICD-10-CM

## 2018-02-28 DIAGNOSIS — F323 Major depressive disorder, single episode, severe with psychotic features: Principal | ICD-10-CM | POA: Diagnosis present

## 2018-02-28 DIAGNOSIS — I10 Essential (primary) hypertension: Secondary | ICD-10-CM | POA: Diagnosis present

## 2018-02-28 DIAGNOSIS — Z833 Family history of diabetes mellitus: Secondary | ICD-10-CM

## 2018-02-28 DIAGNOSIS — Z590 Homelessness: Secondary | ICD-10-CM

## 2018-02-28 DIAGNOSIS — K219 Gastro-esophageal reflux disease without esophagitis: Secondary | ICD-10-CM | POA: Diagnosis present

## 2018-02-28 DIAGNOSIS — Z79891 Long term (current) use of opiate analgesic: Secondary | ICD-10-CM

## 2018-02-28 DIAGNOSIS — N179 Acute kidney failure, unspecified: Secondary | ICD-10-CM

## 2018-02-28 DIAGNOSIS — Z886 Allergy status to analgesic agent status: Secondary | ICD-10-CM

## 2018-02-28 DIAGNOSIS — Z86711 Personal history of pulmonary embolism: Secondary | ICD-10-CM

## 2018-02-28 DIAGNOSIS — R45851 Suicidal ideations: Secondary | ICD-10-CM | POA: Diagnosis present

## 2018-02-28 DIAGNOSIS — Z915 Personal history of self-harm: Secondary | ICD-10-CM

## 2018-02-28 DIAGNOSIS — Z86718 Personal history of other venous thrombosis and embolism: Secondary | ICD-10-CM

## 2018-02-28 LAB — GLUCOSE, CAPILLARY: GLUCOSE-CAPILLARY: 85 mg/dL (ref 70–99)

## 2018-02-28 LAB — BASIC METABOLIC PANEL
Anion gap: 6 (ref 5–15)
BUN: 16 mg/dL (ref 6–20)
CHLORIDE: 113 mmol/L — AB (ref 98–111)
CO2: 23 mmol/L (ref 22–32)
Calcium: 8.2 mg/dL — ABNORMAL LOW (ref 8.9–10.3)
Creatinine, Ser: 1.43 mg/dL — ABNORMAL HIGH (ref 0.61–1.24)
GFR calc Af Amer: >60 mL/min (ref >60)
GFR calc non Af Amer: 55 mL/min — ABNORMAL LOW (ref >60)
Glucose, Bld: 102 mg/dL — ABNORMAL HIGH (ref 70–99)
POTASSIUM: 3.9 mmol/L (ref 3.5–5.1)
SODIUM: 142 mmol/L (ref 135–145)

## 2018-02-28 LAB — CK: CK TOTAL: 3047 U/L — AB (ref 49–397)

## 2018-02-28 LAB — CBC
HEMATOCRIT: 36.5 % — AB (ref 39.0–52.0)
HEMOGLOBIN: 11.6 g/dL — AB (ref 13.0–17.0)
MCH: 27.9 pg (ref 26.0–34.0)
MCHC: 31.8 g/dL (ref 30.0–36.0)
MCV: 87.7 fL (ref 80.0–100.0)
Platelets: 164 10*3/uL (ref 150–400)
RBC: 4.16 MIL/uL — AB (ref 4.22–5.81)
RDW: 14.8 % (ref 11.5–15.5)
WBC: 4.2 10*3/uL (ref 4.0–10.5)
nRBC: 0 % (ref 0.0–0.2)

## 2018-02-28 MED ORDER — HYDROXYZINE HCL 25 MG PO TABS
25.0000 mg | ORAL_TABLET | Freq: Three times a day (TID) | ORAL | Status: DC | PRN
  Administered 2018-02-28 – 2018-03-07 (×7): 25 mg via ORAL
  Filled 2018-02-28 (×5): qty 1
  Filled 2018-02-28: qty 10
  Filled 2018-02-28 (×3): qty 1

## 2018-02-28 MED ORDER — METFORMIN HCL 500 MG PO TABS
500.0000 mg | ORAL_TABLET | Freq: Every day | ORAL | Status: DC
  Administered 2018-03-01 – 2018-03-08 (×8): 500 mg via ORAL
  Filled 2018-02-28 (×12): qty 1

## 2018-02-28 MED ORDER — GABAPENTIN 400 MG PO CAPS
800.0000 mg | ORAL_CAPSULE | Freq: Once | ORAL | Status: AC
  Administered 2018-02-28: 800 mg via ORAL
  Filled 2018-02-28: qty 2

## 2018-02-28 MED ORDER — ALUM & MAG HYDROXIDE-SIMETH 200-200-20 MG/5ML PO SUSP
30.0000 mL | ORAL | Status: DC | PRN
  Administered 2018-03-03 – 2018-03-05 (×2): 30 mL via ORAL
  Filled 2018-02-28 (×3): qty 30

## 2018-02-28 MED ORDER — ENSURE ENLIVE PO LIQD
237.0000 mL | Freq: Two times a day (BID) | ORAL | Status: DC
  Administered 2018-03-01 – 2018-03-08 (×7): 237 mL via ORAL

## 2018-02-28 MED ORDER — TRAZODONE HCL 50 MG PO TABS
50.0000 mg | ORAL_TABLET | Freq: Every evening | ORAL | Status: DC | PRN
  Administered 2018-02-28: 50 mg via ORAL
  Filled 2018-02-28 (×7): qty 1

## 2018-02-28 MED ORDER — TRAZODONE HCL 50 MG PO TABS
150.0000 mg | ORAL_TABLET | Freq: Once | ORAL | Status: DC
  Filled 2018-02-28: qty 3

## 2018-02-28 MED ORDER — MAGNESIUM HYDROXIDE 400 MG/5ML PO SUSP
30.0000 mL | Freq: Every day | ORAL | Status: DC | PRN
  Filled 2018-02-28: qty 30

## 2018-02-28 MED ORDER — BISACODYL 5 MG PO TBEC
10.0000 mg | DELAYED_RELEASE_TABLET | Freq: Once | ORAL | Status: AC
  Administered 2018-03-01: 10 mg via ORAL
  Filled 2018-02-28 (×2): qty 2

## 2018-02-28 MED ORDER — DICYCLOMINE HCL 20 MG PO TABS
20.0000 mg | ORAL_TABLET | Freq: Two times a day (BID) | ORAL | Status: DC
Start: 2018-02-28 — End: 2018-03-03
  Administered 2018-02-28 – 2018-03-03 (×6): 20 mg via ORAL
  Filled 2018-02-28 (×9): qty 1

## 2018-02-28 MED ORDER — GABAPENTIN 400 MG PO CAPS
800.0000 mg | ORAL_CAPSULE | Freq: Three times a day (TID) | ORAL | Status: DC
  Administered 2018-02-28 – 2018-03-08 (×23): 800 mg via ORAL
  Filled 2018-02-28 (×2): qty 2
  Filled 2018-02-28: qty 42
  Filled 2018-02-28 (×2): qty 2
  Filled 2018-02-28: qty 42
  Filled 2018-02-28 (×25): qty 2
  Filled 2018-02-28: qty 42

## 2018-02-28 MED ORDER — NICOTINE 14 MG/24HR TD PT24
14.0000 mg | MEDICATED_PATCH | Freq: Every day | TRANSDERMAL | Status: DC
  Administered 2018-03-01 – 2018-03-02 (×2): 14 mg via TRANSDERMAL
  Filled 2018-02-28 (×6): qty 1

## 2018-02-28 MED ORDER — DOCUSATE SODIUM 100 MG PO CAPS
100.0000 mg | ORAL_CAPSULE | Freq: Two times a day (BID) | ORAL | Status: DC
  Administered 2018-03-01 – 2018-03-05 (×7): 100 mg via ORAL
  Filled 2018-02-28 (×13): qty 1

## 2018-02-28 MED ORDER — ACETAMINOPHEN 325 MG PO TABS
650.0000 mg | ORAL_TABLET | Freq: Four times a day (QID) | ORAL | Status: DC | PRN
  Administered 2018-02-28 – 2018-03-06 (×2): 650 mg via ORAL
  Filled 2018-02-28 (×3): qty 2
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
## 2018-02-28 NOTE — H&P (Signed)
Behavioral Health Medical Screening Exam  Dale [name] is an 52 y.o. male.  Total Time spent with patient: 20 minutes  Psychiatric Specialty Exam: Physical Exam  Constitutional: He is oriented to person, place, and time. He appears well-developed and well-nourished. No distress.  HENT:  Head: Normocephalic and atraumatic.  Right Ear: External ear normal.  Left Ear: External ear normal.  Eyes: Pupils are equal, round, and reactive to light. Right eye exhibits no discharge. Left eye exhibits no discharge.  Respiratory: Effort normal. No respiratory distress.  Musculoskeletal: Normal range of motion.  Neurological: He is alert and oriented to person, place, and time.  Skin: Skin is warm and dry. He is not diaphoretic.    Review of Systems  Constitutional: Negative for chills, fever and weight loss.  Respiratory: Negative for shortness of breath.   Cardiovascular: Negative for chest pain.  Musculoskeletal: Positive for joint pain and myalgias.  Neurological: Negative for dizziness.  Psychiatric/Behavioral: Positive for depression, hallucinations, substance abuse and suicidal ideas. Negative for memory loss. The patient is nervous/anxious and has insomnia.   All other systems reviewed and are negative.   There were no vitals taken for this visit.There is no height or weight on file to calculate BMI.  General Appearance: Disheveled  Eye Contact:  Fair  Speech:  Clear and Coherent and Normal Rate  Volume:  Normal  Mood:  Anxious, Depressed, Dysphoric, Hopeless, Irritable and Worthless  Affect:  Congruent and Depressed  Thought Process:  Coherent and Descriptions of Associations: Intact  Orientation:  Full (Time, Place, and Person)  Thought Content:  Logical and Hallucinations: Auditory Command:  Voices tell him to kill himself and others  Suicidal Thoughts:  Yes.  without intent/plan  Homicidal Thoughts:  No  Memory:  Immediate;   Fair Recent;   Fair  Judgement:  Impaired   Insight:  Fair  Psychomotor Activity:  Normal  Concentration: Concentration: Fair and Attention Span: Fair  Recall:  Good  Fund of Knowledge:Good  Language: Good  Akathisia:  NA  Handed:  Right  AIMS (if indicated):     Assets:  Communication Skills Desire for Improvement Intimacy Physical Health  Sleep:       Musculoskeletal: Strength & Muscle Tone: within normal limits Gait & Station: normal   Blood pressure 132/89, pulse 73, temperature (!) 97.4 F (36.3 C), resp. rate 18.  Recommendations:  Based on my evaluation the patient does not appear to have an emergency medical condition.  Jackelyn Poling, NP 02/28/2018, 9:35 PM

## 2018-02-28 NOTE — Progress Notes (Signed)
Report received from [facility].  Upon initial approach, Dale [name] was standing at the medication window bouncing and making grunting noises due to the pain in his feet as well as rectal pain due to not having a bowel movement in "6 days."  He reported being diagnosed with Crohn's disease in 2018 but was unable to provide the name of the medication that helps with his constipation.  Offered MOM and he stated "that won't help."   Staffed with Barbara Cower NP and new orders noted.  Informed Lazarus of the new orders and he stated "I'm not going to take that tonight, I don't want to be up all night." Tried to encourage him to take this due to the stated pain level 10/10.  He declined and stated he was going to go to sleep.  He did take his hs medications as well as prn tylenol, vistaril and trazodone.  We will continue to monitor.

## 2018-02-28 NOTE — Progress Notes (Signed)
D/C teaching was done and suicide help lines and suicide education was done and the patient requested a bus pass and wanted the phone number for [facility] in [place]. I gave him the address and the phone number. Social work was called and she said she will provide resources for behavioral health hospital and a bus pass.

## 2018-02-28 NOTE — Progress Notes (Signed)
Dale [name] to be D/C'd Home per MD order.  Discussed with the patient and all questions fully answered.  VSS, Skin clean, dry and intact without evidence of skin break down, no evidence of skin tears noted. IV catheter discontinued intact. Site without signs and symptoms of complications. Dressing and pressure applied.  An After Visit Summary was printed and given to the patient. Patient received prescription.  D/c education completed with patient/family including follow up instructions, medication list, d/c activities limitations if indicated, with other d/c instructions as indicated by MD - patient able to verbalize understanding, all questions fully answered.   Patient instructed to return to ED, call 911, or call MD for any changes in condition.   Patient escorted via walked out, and D/C home (possibly going to [facility]) via bus  [name] 02/28/2018 1:43 PM

## 2018-02-28 NOTE — Tx Team (Signed)
Initial Treatment Plan 02/28/2018 10:34 PM Dale [name] ZSM:[id number]    PATIENT STRESSORS: Health problems Marital or family conflict Substance abuse Traumatic event   PATIENT STRENGTHS: Average or above average intelligence Communication skills General fund of knowledge   PATIENT IDENTIFIED PROBLEMS: "my anger"  "learn how to talk to somebody"    SI  HI  AH  depression  Substance abuse       DISCHARGE CRITERIA:  Improved stabilization in mood, thinking, and/or behavior Motivation to continue treatment in a less acute level of care  PRELIMINARY DISCHARGE PLAN: Outpatient therapy  PATIENT/FAMILY INVOLVEMENT: This treatment plan has been presented to and reviewed with the patient, Dale [name].  The patient and family have been given the opportunity to ask questions and make suggestions.  Arrie Aran, [place] 02/28/2018, 10:34 PM
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
## 2018-02-28 NOTE — Progress Notes (Signed)
Patient c/o of foot pain requested home dose of gabapentin and something for sleep. Paged Schorr, NP awaiting a callback or orders to be placed. Patient also reported he has not had a bowel movement in 6 days requesting his chron's diease medication regimen, but unsure of medications at this time.

## 2018-02-28 NOTE — Discharge Instructions (Signed)
Follow with Primary MD in 7 days   Get CBC, CMP, 2 view Chest X ray -  checked  by Primary MD  in 5-7 days    Activity: As tolerated with Full fall precautions use walker/cane & assistance as needed  Disposition Home    Diet: Heart Healthy Low Carb   Special Instructions: If you have smoked or chewed Tobacco  in the last 2 yrs please stop smoking, stop any regular Alcohol  and or any Recreational drug use.  On your next visit with your primary care physician please Get Medicines reviewed and adjusted.  Please request your Prim.MD to go over all Hospital Tests and Procedure/Radiological results at the follow up, please get all Hospital records sent to your Prim MD by signing hospital release before you go home.  If you experience worsening of your admission symptoms, develop shortness of breath, life threatening emergency, suicidal or homicidal thoughts you must seek medical attention immediately by calling 911 or calling your MD immediately  if symptoms less severe.  You Must read complete instructions/literature along with all the possible adverse reactions/side effects for all the Medicines you take and that have been prescribed to you. Take any new Medicines after you have completely understood and accpet all the possible adverse reactions/side effects.   Do not drive, operate heavy machinery, perform activities at heights, swimming or participation in water activities or provide baby sitting services if your were admitted for syncope or siezures until you have seen by Primary MD or a Neurologist and advised to do so again.  Do not drive when taking Pain medications.  Do not take more than prescribed Pain, Sleep and Anxiety Medications  Wear Seat belts while driving.   Please note  You were cared for by a hospitalist during your hospital stay. If you have any questions about your discharge medications or the care you received while you were in the hospital after you are discharged,  you can call the unit and asked to speak with the hospitalist on call if the hospitalist that took care of you is not available. Once you are discharged, your primary care physician will handle any further medical issues. Please note that NO REFILLS for any discharge medications will be authorized once you are discharged, as it is imperative that you return to your primary care physician (or establish a relationship with a primary care physician if you do not have one) for your aftercare needs so that they can reassess your need for medications and monitor your lab values.
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
## 2018-03-01 DIAGNOSIS — G4700 Insomnia, unspecified: Secondary | ICD-10-CM

## 2018-03-01 DIAGNOSIS — F419 Anxiety disorder, unspecified: Secondary | ICD-10-CM

## 2018-03-01 DIAGNOSIS — F323 Major depressive disorder, single episode, severe with psychotic features: Secondary | ICD-10-CM | POA: Diagnosis not present

## 2018-03-01 DIAGNOSIS — F1410 Cocaine abuse, uncomplicated: Secondary | ICD-10-CM

## 2018-03-01 LAB — GLUCOSE, CAPILLARY: GLUCOSE-CAPILLARY: 113 mg/dL — AB (ref 70–99)

## 2018-03-01 MED ORDER — DULOXETINE HCL 20 MG PO CPEP
20.0000 mg | ORAL_CAPSULE | Freq: Every day | ORAL | Status: DC
  Administered 2018-03-02 – 2018-03-03 (×2): 20 mg via ORAL
  Filled 2018-03-01 (×3): qty 1

## 2018-03-01 MED ORDER — APIXABAN 2.5 MG PO TABS
2.5000 mg | ORAL_TABLET | Freq: Two times a day (BID) | ORAL | Status: DC
  Administered 2018-03-01 – 2018-03-08 (×14): 2.5 mg via ORAL
  Filled 2018-03-01 (×19): qty 1

## 2018-03-01 MED ORDER — LORAZEPAM 0.5 MG PO TABS: 0.5000 mg | ORAL_TABLET | Freq: Four times a day (QID) | ORAL | Status: DC | PRN

## 2018-03-01 MED ORDER — PHENYLEPH-SHARK LIV OIL-MO-PET 0.25-3-14-71.9 % RE OINT
TOPICAL_OINTMENT | RECTAL | Status: AC
  Filled 2018-03-01: qty 28.4

## 2018-03-01 MED ORDER — OLANZAPINE 2.5 MG PO TABS
2.5000 mg | ORAL_TABLET | Freq: Every day | ORAL | Status: DC
  Filled 2018-03-01: qty 1

## 2018-03-01 MED ORDER — DULOXETINE HCL 20 MG PO CPEP
20.0000 mg | ORAL_CAPSULE | Freq: Every day | ORAL | Status: DC
  Filled 2018-03-01 (×2): qty 1

## 2018-03-01 MED ORDER — PHENYLEPH-SHARK LIV OIL-MO-PET 0.25-3-14-71.9 % RE OINT
TOPICAL_OINTMENT | Freq: Two times a day (BID) | RECTAL | Status: DC | PRN
  Administered 2018-03-01 – 2018-03-03 (×2): via RECTAL
  Filled 2018-03-01: qty 28.4

## 2018-03-01 MED ORDER — OLANZAPINE 5 MG PO TABS
5.0000 mg | ORAL_TABLET | Freq: Every day | ORAL | Status: DC
  Administered 2018-03-01 – 2018-03-02 (×2): 5 mg via ORAL
  Filled 2018-03-01 (×4): qty 1

## 2018-03-01 MED ORDER — HYDROCORTISONE 1 % EX CREA
TOPICAL_CREAM | CUTANEOUS | Status: AC
  Administered 2018-03-01: 17:00:00
  Filled 2018-03-01: qty 28

## 2018-03-01 MED ORDER — TRAZODONE HCL 50 MG PO TABS
50.0000 mg | ORAL_TABLET | Freq: Every evening | ORAL | Status: DC | PRN
  Administered 2018-03-03 – 2018-03-04 (×2): 50 mg via ORAL
  Filled 2018-03-01 (×3): qty 1

## 2018-03-01 MED ORDER — NICOTINE POLACRILEX 2 MG MT GUM
CHEWING_GUM | OROMUCOSAL | Status: AC
  Filled 2018-03-01: qty 1

## 2018-03-01 MED ORDER — SERTRALINE HCL 25 MG PO TABS
25.0000 mg | ORAL_TABLET | Freq: Every day | ORAL | Status: DC
  Filled 2018-03-01: qty 1
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
## 2018-03-01 NOTE — Plan of Care (Signed)
  Problem: Activity: Goal: Sleeping patterns will improve Outcome: Progressing   Problem: Physical Regulation: Goal: Ability to maintain clinical measurements within normal limits will improve Outcome: Progressing   Problem: Safety: Goal: Periods of time without injury will increase Outcome: Progressing   
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
## 2018-03-01 NOTE — [facility] Suicide Risk Assessment (Signed)
[facility] INPATIENT:  Family/Significant Other Suicide Prevention Education  Suicide Prevention Education:  Patient Refusal for Family/Significant Other Suicide Prevention Education: The patient Dale [name] has refused to provide written consent for family/significant other to be provided Family/Significant Other Suicide Prevention Education during admission and/or prior to discharge.  Physician notified.  SPE completed with patient, as patient refused to consent to family contact. Patient was encouraged to share information with support network, ask questions, and talk about any concerns relating to SPE. Patient denies access to guns/firearms and verbalized understanding of information provided. Mobile Crisis information also provided to patient.    [name] 03/01/2018, 11:04 AM
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
## 2018-03-01 NOTE — [facility] Group Notes (Signed)
The focus of this group is to help patients establish daily goals to achieve during treatment and discuss how the patient can incorporate goal setting into their daily lives to aide in recovery.  Pt did not attend 

## 2018-03-01 NOTE — [facility] Group Notes (Signed)
Type of Therapy: Mental Health Association Presentation  Participation Level: Did Not Attend   Summary of Progress/Problems:Invited, chose not to attend.    Ashlynd Michna LCSWA Clinical Social Worker 

## 2018-03-01 NOTE — Progress Notes (Signed)
Recreation Therapy Notes  Date: 11.27.19 Time: 930 Location: [address] Dayroom  Group Topic: Stress Management  Goal Area(s) Addresses:  Patient will verbalize importance of using healthy stress management.  Patient will identify positive emotions associated with healthy stress management.   Intervention: Stress Management  Activity :  Meditation.  LRT introduced the stress management technique of meditation.  LRT played a meditation that guided patients to focus on the present and let go of things they can't change.  Patients were to follow along as the meditation played.  Education:  Stress Management, Discharge Planning.   Education Outcome: Acknowledges edcuation/In group clarification offered/Needs additional education  Clinical Observations/Feedback: Pt did not attend group.     Caroll Rancher, LRT/CTRS         Lillia Abed, Rithika Seel A 03/01/2018 11:05 AM
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
## 2018-03-01 NOTE — Progress Notes (Signed)
D: Pt was in dayroom upon initial approach.  Pt presents with anxious, depressed affect and mood.  He initially reports his day was "good" and then states "I feel like shit.  It has been a horrible day, waking me up to go to meals and I haven't slept."  Denies having a goal tonight so Probation officer and pt made goal for pt to be safe and sleep well.  Pt denies HI, denies hallucinations, denies pain.  He endorses SI without a plan.  Pt verbally contracts for safety.  Pt has been visible in milieu interacting with peers appropriately.  His speech is loud, pressured and he is demanding at times.    A: Introduced self to pt.  Met with pt 1:1.  Actively listened to pt and offered support and encouragement.  Medications administered per order.  PRN medication administered for anxiety.  Q15 minute safety checks maintained.  R: Pt is safe on the unit.  Pt is compliant with medications.  Pt verbally contracts for safety.  Will continue to monitor and assess.
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
## 2018-03-01 NOTE — Progress Notes (Signed)
NUTRITION ASSESSMENT  Pt identified as at risk on the Malnutrition Screen Tool  INTERVENTION: - Continue Ensure Enlive po BID, each supplement provides 350 kcal and 20 grams of protein - Continue to encourage PO intakes.   NUTRITION DIAGNOSIS: Unintentional weight loss related to sub-optimal intake as evidenced by pt report.   Goal: Pt to meet >/= 90% of their estimated nutrition needs.  Monitor:  PO intake  Assessment:  Patient admitted for SI with a plan, HI, and AHs. Per chart review, current weight is 170 lb and weight on 11/22 recorded as 183 lb. Per review of Care Everywhere, patient weighed 194 lb at [facility] on 10/22. This indicates 11 lb weight loss (5.7% body weight) from 10/22-11/22.     52 y.o. male  Height: Ht Readings from Last 1 Encounters:  02/28/18 5\' 11"  (1.803 m)    Weight: Wt Readings from Last 1 Encounters:  02/28/18 77.1 kg    Weight Hx: Wt Readings from Last 10 Encounters:  02/28/18 77.1 kg  02/27/18 77.2 kg  02/24/18 83 kg  12/02/14 87.1 kg  11/11/14 85.3 kg  11/11/14 86.2 kg  10/11/14 92.2 kg  06/08/14 86.6 kg  05/01/14 90.7 kg  02/18/14 93.4 kg    BMI:  Body mass index is 23.71 kg/m. Pt meets criteria for normal weight based on current BMI.  Estimated Nutritional Needs: Kcal: 25-30 kcal/kg Protein: > 1 gram protein/kg Fluid: 1 ml/kcal  Diet Order:  Diet Order            Diet Heart Room service appropriate? Yes; Fluid consistency: Thin  Diet effective now             Pt is also offered choice of unit snacks mid-morning and mid-afternoon.  Pt is eating as desired.   Lab results and medications reviewed.    Trenton Gammon, MS, RD, LDN, [facility] Inpatient Clinical Dietitian Pager # [phone] After hours/weekend pager # [phone]
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
## 2018-03-02 DIAGNOSIS — F419 Anxiety disorder, unspecified: Secondary | ICD-10-CM | POA: Diagnosis not present

## 2018-03-02 DIAGNOSIS — F332 Major depressive disorder, recurrent severe without psychotic features: Secondary | ICD-10-CM | POA: Diagnosis not present

## 2018-03-02 DIAGNOSIS — G4700 Insomnia, unspecified: Secondary | ICD-10-CM | POA: Diagnosis not present

## 2018-03-02 LAB — COMPREHENSIVE METABOLIC PANEL
ALBUMIN: 3.3 g/dL — AB (ref 3.5–5.0)
ALK PHOS: 65 U/L (ref 38–126)
ALT: 59 U/L — ABNORMAL HIGH (ref 0–44)
AST: 57 U/L — AB (ref 15–41)
Anion gap: 5 (ref 5–15)
BUN: 10 mg/dL (ref 6–20)
CALCIUM: 8.5 mg/dL — AB (ref 8.9–10.3)
CO2: 23 mmol/L (ref 22–32)
Chloride: 115 mmol/L — ABNORMAL HIGH (ref 98–111)
Creatinine, Ser: 1.28 mg/dL — ABNORMAL HIGH (ref 0.61–1.24)
GFR calc Af Amer: >60 mL/min (ref >60)
GFR calc non Af Amer: >60 mL/min (ref >60)
Glucose, Bld: 155 mg/dL — ABNORMAL HIGH (ref 70–99)
Potassium: 3.5 mmol/L (ref 3.5–5.1)
Sodium: 143 mmol/L (ref 135–145)
TOTAL PROTEIN: 5.7 g/dL — AB (ref 6.5–8.1)
Total Bilirubin: 0.4 mg/dL (ref 0.3–1.2)

## 2018-03-02 LAB — GLUCOSE, CAPILLARY: Glucose-Capillary: 115 mg/dL — ABNORMAL HIGH (ref 70–99)

## 2018-03-02 LAB — CK: Total CK: 945 U/L — ABNORMAL HIGH (ref 49–397)

## 2018-03-02 NOTE — Progress Notes (Signed)
D:  Patient has poor sleep, no sleep medication.  Fair appetite, normal energy level, poor concentration.  Rated depression 10, anxiety 6.  Denied withdrawals.  Then checked cravings, irritability.  Checked SI and No SI, contracts for safety.  Has felt lightheaded, pain, headache.  Pain medicine helpful.   A:  Medications administered per MD orders.  Emotional support and encouragement given patient. R:  SI and HI, contracts for safety, no plan.  A/V hallucinations.  Safety maintained with 15 minute checks.

## 2018-03-02 NOTE — Plan of Care (Signed)
  Problem: Activity: Goal: Imbalance in normal sleep/wake cycle will improve Outcome: Progressing Note: Slept 6.75 hours last night according to flowsheet.    
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
## 2018-03-02 NOTE — Plan of Care (Signed)
Nurse discussed anxiety, depression, coping skills with patient. 

## 2018-03-02 NOTE — Progress Notes (Signed)
D: Pt was in bed in his room upon initial approach.  Pt presents with depressed, anxious affect and mood.  Speech remains pressured.  He describes his day as "all right."  His goal is "trying to get focused."  He reports having "a lot of bad thoughts" and feeling "paranoid."  Pt denies HI, denies pain.  He reports SI without a plan.  He reports AH and states "I just keep hearing a lot of weird voices."  Pt has been visible in milieu interacting with peers and staff appropriately.  Pt attended evening group.    A: Introduced self to pt.  Met with pt 1:1.  Actively listened to pt and offered support and encouragement.  Medications administered per order.  PRN medication administered for anxiety.  Q15 minute safety checks maintained.  R: Pt is safe on the unit.  Pt is compliant with medications.  Pt verbally contracts for safety.  Will continue to monitor and assess.   

## 2018-03-03 DIAGNOSIS — F323 Major depressive disorder, single episode, severe with psychotic features: Secondary | ICD-10-CM | POA: Diagnosis not present

## 2018-03-03 DIAGNOSIS — F1410 Cocaine abuse, uncomplicated: Secondary | ICD-10-CM | POA: Diagnosis not present

## 2018-03-03 DIAGNOSIS — F419 Anxiety disorder, unspecified: Secondary | ICD-10-CM | POA: Diagnosis not present

## 2018-03-03 DIAGNOSIS — G4700 Insomnia, unspecified: Secondary | ICD-10-CM | POA: Diagnosis not present

## 2018-03-03 LAB — GLUCOSE, CAPILLARY: Glucose-Capillary: 85 mg/dL (ref 70–99)

## 2018-03-03 MED ORDER — DICYCLOMINE HCL 20 MG PO TABS
20.0000 mg | ORAL_TABLET | Freq: Two times a day (BID) | ORAL | Status: DC | PRN
  Administered 2018-03-03 – 2018-03-05 (×5): 20 mg via ORAL
  Filled 2018-03-03 (×5): qty 1

## 2018-03-03 MED ORDER — OLANZAPINE 7.5 MG PO TABS
7.5000 mg | ORAL_TABLET | Freq: Every day | ORAL | Status: DC
  Administered 2018-03-03 – 2018-03-04 (×2): 7.5 mg via ORAL
  Filled 2018-03-03 (×3): qty 1

## 2018-03-03 MED ORDER — DULOXETINE HCL 20 MG PO CPEP
40.0000 mg | ORAL_CAPSULE | Freq: Every day | ORAL | Status: DC
  Administered 2018-03-04 – 2018-03-05 (×2): 40 mg via ORAL
  Filled 2018-03-03 (×3): qty 2

## 2018-03-03 NOTE — [facility] Group Notes (Signed)
Adult Psychoeducational Group Note  Date:  03/03/2018 Time:  2:22 AM  Group Topic/Focus:  Wrap-Up Group:   The focus of this group is to help patients review their daily goal of treatment and discuss progress on daily workbooks.  Participation Level:  Active  Participation Quality:  Appropriate and Attentive  Affect:  Appropriate  Cognitive:  Alert and Appropriate  Insight: Appropriate and Good  Engagement in Group:  Engaged  Modes of Intervention:  Discussion and Education  Additional Comments:  Pt attended and participated in wrap up group this evening. Pt rated their day a 1/10, due to them having "no hope, and no life". Pt did not complete their goal, which was to have positive thoughts.   Chrisandra Netters 03/03/2018, 2:22 AM

## 2018-03-03 NOTE — Plan of Care (Signed)
  Problem: Coping: Goal: Ability to verbalize frustrations and anger appropriately will improve Outcome: Progressing Goal: Ability to demonstrate self-control will improve Outcome: Progressing   D: Pt alert and oriented on the unit. Pt engaging with RN staff and other pts. Pt denies SI/HI, A/VH. Pt's affect was flat and mood depressed. Pt rated both his depression and feelings of hopeless a 7, and anxiety an 8, both on a scale of 0 to 10, with 10 being the worst. Pt was a little paranoid this morning during medication time. Pt stood to the side of the med window as his medications were passed. Pt was sitting in day room for most of the afternoon and was cooperative.  A: Education, support and encouragement provided, q15 minute safety checks remain in effect. Medications administered per MD orders.  R: No reactions/side effects to medicine noted. Pt denies any concerns at this time, and verbally contracts for safety. Pt ambulating on the unit with no issues. Pt remains safe on and off the unit.

## 2018-03-03 NOTE — Progress Notes (Signed)
Adult Psychoeducational Group Note  Date:  03/03/2018 Time:  9:13 PM  Group Topic/Focus:  Wrap-Up Group:   The focus of this group is to help patients review their daily goal of treatment and discuss progress on daily workbooks.  Participation Level:  Active  Participation Quality:  Appropriate and Attentive  Affect:  Appropriate  Cognitive:  Alert and Appropriate  Insight: Appropriate and Good  Engagement in Group:  Engaged  Modes of Intervention:  Discussion  Additional Comments:  Pt attend wrap up group. His day was a 5. He's depress. He said his mind is not focus clear. He is hearing human voices. He has talk with his doctor.  Charna Busman Long 03/03/2018, 9:13 PM
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
## 2018-03-04 DIAGNOSIS — F419 Anxiety disorder, unspecified: Secondary | ICD-10-CM | POA: Diagnosis not present

## 2018-03-04 DIAGNOSIS — G4700 Insomnia, unspecified: Secondary | ICD-10-CM | POA: Diagnosis not present

## 2018-03-04 DIAGNOSIS — F1410 Cocaine abuse, uncomplicated: Secondary | ICD-10-CM | POA: Diagnosis not present

## 2018-03-04 DIAGNOSIS — F323 Major depressive disorder, single episode, severe with psychotic features: Secondary | ICD-10-CM | POA: Diagnosis not present

## 2018-03-04 LAB — GLUCOSE, CAPILLARY: Glucose-Capillary: 77 mg/dL (ref 70–99)

## 2018-03-04 MED ORDER — PANTOPRAZOLE SODIUM 40 MG PO TBEC
40.0000 mg | DELAYED_RELEASE_TABLET | Freq: Every day | ORAL | Status: DC
  Administered 2018-03-04 – 2018-03-08 (×5): 40 mg via ORAL
  Filled 2018-03-04 (×8): qty 1

## 2018-03-04 NOTE — Progress Notes (Signed)
Pt has been observed lying on one of the small couches in the dayroom this evening.  He reports that he has not felt well since dinner, and states he has severe acid reflux.  "I usually take Prilosec at home."  Writer will inform provider to hopefully get pt something comparable to the Prilosec scheduled.  Pt was given prn Maalox at that time.  Pt denies SI/HI/AVH at this time.  He makes his needs known to staff.  Support and encouragement offered.  Discharge plans are in process.  Pt says he will probably go stay with his mother in [place] at discharge.  Safety maintained with q15 minute checks.
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
## 2018-03-04 NOTE — Progress Notes (Signed)
D patient is observed OOB UAL on the 400 hall today. He is quiet, guarded, paranoid and suspicious. He wears hospital-issued patient scrubs. HE speaks very softly and quickly and looks over each shoulder as he speaks to this writer, checking to see if anybody has overheard what he has said.     A HE took all of his scheduled meds as planned and he completed his daily assessment and on this he wrote he rated his dperession, hopelessness and anxiety " 01/10/09", respectively. HE stated he has experienced SI today but he verbally contracted with writer to not hurt himself today.      R Safety in place.

## 2018-03-04 NOTE — [facility] Group Notes (Signed)
Adult Psychoeducational Group Note  Date:  03/04/2018 Time:  10:19 PM  Group Topic/Focus:  Wrap-Up Group:   The focus of this group is to help patients review their daily goal of treatment and discuss progress on daily workbooks.  Participation Level:  Active  Participation Quality:  Appropriate and Attentive  Affect:  Appropriate  Cognitive:  Alert and Appropriate  Insight: Appropriate and Good  Engagement in Group:  Engaged  Modes of Intervention:  Discussion and Education  Additional Comments:  Pt attended and participated in wrap up group this evening. Pt rated their day a 5.5/10, due to them talking more and working on their anger. Pt wants to find coping skills for their anger and PTSD. Pt goal is in progress, which is to open up more and try to forget the past.   Chrisandra Netters 03/04/2018, 10:19 PM
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
## 2018-03-04 NOTE — Plan of Care (Signed)
  Problem: Education: Goal: Knowledge of [place] [facility] Education information/materials will improve Outcome: Not Progressing Goal: Emotional status will improve Outcome: Not Progressing   
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
## 2018-03-05 DIAGNOSIS — F419 Anxiety disorder, unspecified: Secondary | ICD-10-CM | POA: Diagnosis not present

## 2018-03-05 DIAGNOSIS — F323 Major depressive disorder, single episode, severe with psychotic features: Secondary | ICD-10-CM | POA: Diagnosis not present

## 2018-03-05 DIAGNOSIS — G4700 Insomnia, unspecified: Secondary | ICD-10-CM | POA: Diagnosis not present

## 2018-03-05 DIAGNOSIS — F1410 Cocaine abuse, uncomplicated: Secondary | ICD-10-CM | POA: Diagnosis not present

## 2018-03-05 LAB — GLUCOSE, CAPILLARY: Glucose-Capillary: 108 mg/dL — ABNORMAL HIGH (ref 70–99)

## 2018-03-05 MED ORDER — OLANZAPINE 5 MG PO TABS
5.0000 mg | ORAL_TABLET | ORAL | Status: DC
  Administered 2018-03-05 – 2018-03-08 (×4): 5 mg via ORAL
  Filled 2018-03-05 (×6): qty 1
  Filled 2018-03-05: qty 21

## 2018-03-05 MED ORDER — LORAZEPAM 1 MG PO TABS
ORAL_TABLET | ORAL | Status: AC
  Filled 2018-03-05: qty 1

## 2018-03-05 MED ORDER — PRAZOSIN HCL 1 MG PO CAPS
1.0000 mg | ORAL_CAPSULE | Freq: Every day | ORAL | Status: DC
  Administered 2018-03-05 – 2018-03-06 (×2): 1 mg via ORAL
  Filled 2018-03-05 (×3): qty 1

## 2018-03-05 MED ORDER — LORAZEPAM 1 MG PO TABS
1.0000 mg | ORAL_TABLET | Freq: Once | ORAL | Status: AC
  Administered 2018-03-05: 1 mg via ORAL

## 2018-03-05 MED ORDER — TRAZODONE HCL 150 MG PO TABS: 75.0000 mg | ORAL_TABLET | Freq: Every evening | ORAL | Status: DC | PRN

## 2018-03-05 MED ORDER — TRAZODONE HCL 50 MG PO TABS
50.0000 mg | ORAL_TABLET | Freq: Every evening | ORAL | Status: DC | PRN
  Administered 2018-03-05 – 2018-03-06 (×2): 50 mg via ORAL
  Filled 2018-03-05 (×2): qty 1

## 2018-03-05 MED ORDER — SERTRALINE HCL 50 MG PO TABS
50.0000 mg | ORAL_TABLET | Freq: Every day | ORAL | Status: DC
  Administered 2018-03-05 – 2018-03-08 (×4): 50 mg via ORAL
  Filled 2018-03-05 (×5): qty 1
  Filled 2018-03-05 (×2): qty 7

## 2018-03-05 MED ORDER — CALCIUM CARBONATE ANTACID 500 MG PO CHEW
2.0000 | CHEWABLE_TABLET | Freq: Two times a day (BID) | ORAL | Status: DC | PRN
  Administered 2018-03-05 – 2018-03-06 (×2): 400 mg via ORAL
  Filled 2018-03-05 (×3): qty 2

## 2018-03-05 MED ORDER — OLANZAPINE 10 MG PO TABS
10.0000 mg | ORAL_TABLET | Freq: Every day | ORAL | Status: DC
  Administered 2018-03-05 – 2018-03-07 (×3): 10 mg via ORAL
  Filled 2018-03-05 (×5): qty 1
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
## 2018-03-05 NOTE — [facility] Group Notes (Signed)
Adult Psychoeducational Group Note  Date:  03/05/2018 Time:  10:37 PM  Group Topic/Focus:  Wrap-Up Group:   The focus of this group is to help patients review their daily goal of treatment and discuss progress on daily workbooks.  Participation Level:  Active  Participation Quality:  Appropriate and Attentive  Affect:  Appropriate  Cognitive:  Alert and Appropriate  Insight: Appropriate and Good  Engagement in Group:  Engaged  Modes of Intervention:  Discussion and Education  Additional Comments:  Pt attended and participated in wrap up group this evening. Pt were asked to pick a letter and a word associated with that letter, and then explain what that word means to them. Pt chose the letter "O", for the word open. Pt told writer that they started being more open and they are feeling a lot better than they were yesterday. Pt told writer that their meds are making them feel "jittery", but it is helping them.   Dale [name] 03/05/2018, 10:37 PM

## 2018-03-05 NOTE — Progress Notes (Signed)
D.  Pt in dayroom on approach, complaint of continued acid reflux.  Pt was positive for evening wrap up group, observed engaged in minimal but appropriate interaction with peers on the unit.  Pt denies SI/HI but does endorse periodic auditory hallucinations that he states are not command in nature.  Pt pleased with medication changes and hopeful for better sleep tonight.  A.  Support and encouragement offered, NP notified of continued acid reflux, new orders received.  R.  Pt remains safe on the unit, will continue to monitor.
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
## 2018-03-05 NOTE — Progress Notes (Signed)
Pt up with report of night terrors, flashbacks,and auditory hallucinations.  Pt states that he used to take minipress and it was some help for this.  Pt reports that audio hallucinations are worse at night.  Pt was in [employer] and has PTSD.

## 2018-03-05 NOTE — [facility] Group Notes (Signed)
Adult Psychoeducational Group Note  Date:  03/05/2018 Time:  11:00 AM  Group Topic/Focus:  Goals Group:   The focus of this group is to help patients establish daily goals to achieve during treatment and discuss how the patient can incorporate goal setting into their daily lives to aide in recovery.  Participation Level:  Active  Participation Quality:  Drowsy  Affect:  Appropriate  Cognitive:  Appropriate  Insight: Appropriate  Engagement in Group:  Improving  Modes of Intervention:  Discussion  Additional Comments:  Gar Gibbon 03/05/2018, 11:00 AM

## 2018-03-05 NOTE — [facility] Group Notes (Signed)
[facility] LCSW Group Therapy Note  03/05/2018  10:00-11:00AM  Type of Therapy and Topic:  Group Therapy:  Adding Supports Including Being Your Own Support  Participation Level:  Active   Description of Group:  Patients in this group were introduced to the concept that additional supports including self-support are an essential part of recovery.  A song entitled "I Need Help!" was played and a group discussion was held in reaction to the idea of needing to add supports.  A song entitled "My Own Hero" was played and a group discussion ensued in which patients stated they could relate to the song and it inspired them to realize they have be willing to help themselves in order to succeed, because other people cannot achieve sobriety or stability for them.  We discussed adding a variety of healthy supports to address the various needs in their lives.  A song was played called "I Know Where I've Been" toward the end of group and used to conduct an inspirational wrap-up to group of remembering how far they have already come in their journey.  Therapeutic Goals: 1)  demonstrate the importance of being a part of one's own support system 2)  discuss reasons people in one's life may eventually be unable to be continually supportive  3)  identify the patient's current support system and   4)  elicit commitments to add healthy supports and to become more conscious of being self-supportive   Summary of Patient Progress:  The patient expressed that his family makes a lot of negative statements to him and is completely lacking in support, while "outsiders" are his biggest supports.  He appreciated the concept of being a better self-support.   Therapeutic Modalities:   Motivational Interviewing Activity  Lynnell Chad

## 2018-03-05 NOTE — Progress Notes (Signed)
[facility] Group Notes:  (Nursing/MHT/Case Management/Adjunct)  Date:  03/05/2018  Time:  1330 Type of Therapy:  Nurse Education with Patricia Duke, RN  Participation Level:  Active  Participation Quality:  Appropriate  Affect:  Appropriate  Cognitive:  Appropriate  Insight:  Appropriate  Engagement in Group:  Engaged  Modes of Intervention:  Discussion and Education     

## 2018-03-06 DIAGNOSIS — F1410 Cocaine abuse, uncomplicated: Secondary | ICD-10-CM | POA: Diagnosis not present

## 2018-03-06 DIAGNOSIS — F419 Anxiety disorder, unspecified: Secondary | ICD-10-CM | POA: Diagnosis not present

## 2018-03-06 DIAGNOSIS — G4700 Insomnia, unspecified: Secondary | ICD-10-CM | POA: Diagnosis not present

## 2018-03-06 DIAGNOSIS — F323 Major depressive disorder, single episode, severe with psychotic features: Secondary | ICD-10-CM | POA: Diagnosis not present

## 2018-03-06 LAB — LIPID PANEL
Cholesterol: 158 mg/dL (ref 0–200)
HDL: 52 mg/dL (ref >40)
LDL Cholesterol: 85 mg/dL (ref 0–99)
Total CHOL/HDL Ratio: 3 RATIO
Triglycerides: 104 mg/dL (ref <150)
VLDL: 21 mg/dL (ref 0–40)

## 2018-03-06 LAB — HEMOGLOBIN A1C
Hgb A1c MFr Bld: 5.8 % — ABNORMAL HIGH (ref 4.8–5.6)
Mean Plasma Glucose: 119.76 mg/dL

## 2018-03-06 LAB — GLUCOSE, CAPILLARY: Glucose-Capillary: 103 mg/dL — ABNORMAL HIGH (ref 70–99)
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
## 2018-03-06 NOTE — [facility] Group Notes (Signed)
Pt was invited but did not attend orientation/goals group. 

## 2018-03-06 NOTE — [facility] Group Notes (Signed)
LCSW Group Therapy Note 03/06/2018 12:31 PM  Type of Therapy and Topic: Group Therapy: Overcoming Obstacles  Participation Level: Did Not Attend  Description of Group:  In this group patients will be encouraged to explore what they see as obstacles to their own wellness and recovery. They will be guided to discuss their thoughts, feelings, and behaviors related to these obstacles. The group will process together ways to cope with barriers, with attention given to specific choices patients can make. Each patient will be challenged to identify changes they are motivated to make in order to overcome their obstacles. This group will be process-oriented, with patients participating in exploration of their own experiences as well as giving and receiving support and challenge from other group members.  Therapeutic Goals: 1. Patient will identify personal and current obstacles as they relate to admission. 2. Patient will identify barriers that currently interfere with their wellness or overcoming obstacles.  3. Patient will identify feelings, thought process and behaviors related to these barriers. 4. Patient will identify two changes they are willing to make to overcome these obstacles:   Summary of Patient Progress  Invited, chose not to attend.    Therapeutic Modalities:  Cognitive Behavioral Therapy Solution Focused Therapy Motivational Interviewing Relapse Prevention Therapy   Alcario Drought Clinical Social Worker

## 2018-03-06 NOTE — Tx Team (Signed)
Interdisciplinary Treatment and Diagnostic Plan Update  03/06/2018 Time of Session:  Dale [name] MRN: [id number]  Principal Diagnosis: Severe major depression with psychotic features, mood-congruent ([place])  Secondary Diagnoses: Active Problems:   Severe major depression with psychotic features, mood-congruent ([facility])   Current Medications:  Current Facility-Administered Medications  Medication Dose Route Frequency Provider Last Rate Last Dose  . acetaminophen (TYLENOL) tablet 650 mg  650 mg Oral Q6H PRN Lindon Romp A, NP   650 mg at 02/28/18 2234  . alum & mag hydroxide-simeth (MAALOX/MYLANTA) 200-200-20 MG/5ML suspension 30 mL  30 mL Oral Q4H PRN Lindon Romp A, NP   30 mL at 03/05/18 1930  . apixaban (ELIQUIS) tablet 2.5 mg  2.5 mg Oral BID Cobos, Myer Peer, MD   2.5 mg at 03/06/18 0856  . calcium carbonate (TUMS - dosed in mg elemental calcium) chewable tablet 400 mg of elemental calcium  2 tablet Oral BID PRN Lindon Romp A, NP   400 mg of elemental calcium at 03/05/18 2208  . dicyclomine (BENTYL) tablet 20 mg  20 mg Oral BID PRN Cobos, Myer Peer, MD   20 mg at 03/05/18 2157  . feeding supplement (ENSURE ENLIVE) (ENSURE ENLIVE) liquid 237 mL  237 mL Oral BID BM Cobos, Myer Peer, MD   237 mL at 03/05/18 1657  . gabapentin (NEURONTIN) capsule 800 mg  800 mg Oral TID Cobos, Myer Peer, MD   800 mg at 03/06/18 0856  . hydrOXYzine (ATARAX/VISTARIL) tablet 25 mg  25 mg Oral TID PRN Rozetta Nunnery, NP   25 mg at 03/04/18 1509  . magnesium hydroxide (MILK OF MAGNESIA) suspension 30 mL  30 mL Oral Daily PRN Lindon Romp A, NP      . metFORMIN (GLUCOPHAGE) tablet 500 mg  500 mg Oral Q breakfast Lindon Romp A, NP   500 mg at 03/06/18 0857  . OLANZapine (ZYPREXA) tablet 10 mg  10 mg Oral QHS Cobos, Myer Peer, MD   10 mg at 03/05/18 2157  . OLANZapine (ZYPREXA) tablet 5 mg  5 mg Oral BH-q7a Cobos, Myer Peer, MD   5 mg at 03/06/18 0609  . pantoprazole (PROTONIX) EC tablet 40 mg  40 mg Oral Daily  Cobos, Myer Peer, MD   40 mg at 03/06/18 0857  . phenylephrine-shark liver oil-mineral oil-petrolatum (PREPARATION H) rectal ointment   Rectal BID PRN Cobos, Myer Peer, MD      . prazosin (MINIPRESS) capsule 1 mg  1 mg Oral QHS Cobos, Myer Peer, MD   1 mg at 03/05/18 2157  . sertraline (ZOLOFT) tablet 50 mg  50 mg Oral Daily Cobos, Myer Peer, MD   50 mg at 03/06/18 0857  . traZODone (DESYREL) tablet 50 mg  50 mg Oral QHS PRN Cobos, Myer Peer, MD   50 mg at 03/05/18 2209   PTA Medications: Medications Prior to Admission  Medication Sig Dispense Refill Last Dose  . apixaban (ELIQUIS) 2.5 MG TABS tablet Take 2.5 mg by mouth 2 (two) times daily.   Past Week at Unknown time  . carbamazepine (TEGRETOL XR) 200 MG 12 hr tablet Take 1 tablet (200 mg total) by mouth 2 (two) times daily. (Patient not taking: Reported on 02/24/2018) 60 tablet 0 Not Taking at Unknown time  . dicyclomine (BENTYL) 20 MG tablet Take 1 tablet (20 mg total) by mouth 3 (three) times daily before meals. (Patient taking differently: Take 20 mg by mouth 2 (two) times daily. ) 90 tablet 0 Past Week at  Unknown time  . docusate sodium (COLACE) 100 MG capsule Take 1 capsule (100 mg total) by mouth 2 (two) times daily. (Patient not taking: Reported on 02/24/2018) 30 capsule 0 Not Taking at Unknown time  . gabapentin (NEURONTIN) 400 MG capsule Take 2 capsules (800 mg total) by mouth 4 (four) times daily. (Patient taking differently: Take 800 mg by mouth 3 (three) times daily. ) 240 capsule 0 Past Week at Unknown time  . hydrocerin (EUCERIN) CREA Apply 1 application topically 2 (two) times daily. For pruitus (Patient not taking: Reported on 02/24/2018) 113 g 0 Not Taking at Unknown time  . hydrocortisone cream 0.5 % Apply topically 2 (two) times daily. To rash (Patient not taking: Reported on 02/24/2018) 15 g 0 Not Taking at Unknown time  . hydrOXYzine (ATARAX/VISTARIL) 25 MG tablet Take 1 tablet (25 mg total) by mouth 3 (three) times daily  as needed for anxiety. (Patient not taking: Reported on 02/24/2018) 30 tablet 0 Not Taking at Unknown time  . metFORMIN (GLUCOPHAGE) 500 MG tablet Take 1 tablet (500 mg total) by mouth daily with breakfast. 30 tablet 0 Past Week at Unknown time  . OLANZapine (ZYPREXA) 10 MG tablet Take 1 tablet (10 mg total) by mouth at bedtime. (Patient not taking: Reported on 02/24/2018) 30 tablet 0 Not Taking at Unknown time  . OLANZapine (ZYPREXA) 7.5 MG tablet Take 1 tablet (7.5 mg total) by mouth 2 (two) times daily. (Patient not taking: Reported on 02/24/2018) 60 tablet 0 Not Taking at Unknown time  . omeprazole (PRILOSEC) 20 MG capsule Take 1 capsule (20 mg total) by mouth daily. 30 capsule 0   . ondansetron (ZOFRAN ODT) 4 MG disintegrating tablet Take 1 tablet (4 mg total) by mouth every 8 (eight) hours as needed for nausea or vomiting. 10 tablet 0   . pantoprazole (PROTONIX) 40 MG tablet Take 1 tablet (40 mg total) by mouth 2 (two) times daily before a meal. (Patient not taking: Reported on 02/24/2018) 60 tablet 0 Not Taking at Unknown time  . phenylephrine-shark liver oil-mineral oil-petrolatum (PREPARATION H) 0.25-3-14-71.9 % rectal ointment Place rectally 2 (two) times daily as needed for hemorrhoids. (Patient not taking: Reported on 02/24/2018) 30 g 0 Not Taking at Unknown time  . prazosin (MINIPRESS) 2 MG capsule Take 1 capsule (2 mg total) by mouth at bedtime. (Patient not taking: Reported on 02/24/2018) 60 capsule 0 Not Taking at Unknown time  . simvastatin (ZOCOR) 20 MG tablet Take 1 tablet (20 mg total) by mouth daily at 6 PM. (Patient not taking: Reported on 02/24/2018) 14 tablet 0 Not Taking at Unknown time  . SUMAtriptan (IMITREX) 50 MG tablet Take 1 tablet (50 mg total) by mouth every 2 (two) hours as needed for migraine or headache. May repeat in 2 hours if headache persists or recurs. 15 tablet 0 unk  . traZODone (DESYREL) 150 MG tablet Take 1 tablet (150 mg total) by mouth at bedtime. For sleep  (Patient not taking: Reported on 02/24/2018) 30 tablet 0 Not Taking at Unknown time    Patient Stressors: Health problems Marital or family conflict Substance abuse Traumatic event  Patient Strengths: Average or above average intelligence Communication skills General fund of knowledge  Treatment Modalities: Medication Management, Group therapy, Case management,  1 to 1 session with clinician, Psychoeducation, Recreational therapy.   Physician Treatment Plan for Primary Diagnosis: <principal problem not specified> Long Term Goal(s): Improvement in symptoms so as ready for discharge Improvement in symptoms so as ready for discharge  Short Term Goals: Ability to identify changes in lifestyle to reduce recurrence of condition will improve Ability to maintain clinical measurements within normal limits will improve Ability to identify changes in lifestyle to reduce recurrence of condition will improve Ability to identify triggers associated with substance abuse/mental health issues will improve  Medication Management: Evaluate patient's response, side effects, and tolerance of medication regimen.  Therapeutic Interventions: 1 to 1 sessions, Unit Group sessions and Medication administration.  Evaluation of Outcomes: Progressing  Physician Treatment Plan for Secondary Diagnosis: Active Problems:   Severe major depression with psychotic features, mood-congruent ([place])  Long Term Goal(s): Improvement in symptoms so as ready for discharge Improvement in symptoms so as ready for discharge   Short Term Goals: Ability to identify changes in lifestyle to reduce recurrence of condition will improve Ability to maintain clinical measurements within normal limits will improve Ability to identify changes in lifestyle to reduce recurrence of condition will improve Ability to identify triggers associated with substance abuse/mental health issues will improve     Medication Management: Evaluate  patient's response, side effects, and tolerance of medication regimen.  Therapeutic Interventions: 1 to 1 sessions, Unit Group sessions and Medication administration.  Evaluation of Outcomes: Progressing   RN Treatment Plan for Primary Diagnosis: <principal problem not specified> Long Term Goal(s): Knowledge of disease and therapeutic regimen to maintain health will improve  Short Term Goals: Ability to participate in decision making will improve, Ability to verbalize feelings will improve, Ability to disclose and discuss suicidal ideas and Ability to identify and develop effective coping behaviors will improve  Medication Management: RN will administer medications as ordered by provider, will assess and evaluate patient's response and provide education to patient for prescribed medication. RN will report any adverse and/or side effects to prescribing provider.  Therapeutic Interventions: 1 on 1 counseling sessions, Psychoeducation, Medication administration, Evaluate responses to treatment, Monitor vital signs and CBGs as ordered, Perform/monitor CIWA, COWS, AIMS and Fall Risk screenings as ordered, Perform wound care treatments as ordered.  Evaluation of Outcomes: Not Met   LCSW Treatment Plan for Primary Diagnosis: <principal problem not specified> Long Term Goal(s): Safe transition to appropriate next level of care at discharge, Engage patient in therapeutic group addressing interpersonal concerns.  Short Term Goals: Engage patient in aftercare planning with referrals and resources  Therapeutic Interventions: Assess for all discharge needs, 1 to 1 time with Social worker, Explore available resources and support systems, Assess for adequacy in community support network, Educate family and significant other(s) on suicide prevention, Complete Psychosocial Assessment, Interpersonal group therapy.  Evaluation of Outcomes: Not Met    Progress in Treatment: Attending groups:  Yes. Participating in groups: Yes. Taking medication as prescribed: Yes. Toleration medication: Yes. Family/Significant other contact made: Yes, individual(s) contacted:  Patient declined collateral contacts, completed SPE with patient. Patient understands diagnosis: Yes. Discussing patient identified problems/goals with staff: Yes. Medical problems stabilized or resolved: Yes. Denies suicidal/homicidal ideation: Yes. Issues/concerns per patient self-inventory: No.   New problem(s) identified:  None   New Short Term/Long Term Goal(s):medication stabilization, elimination of SI thoughts, development of comprehensive mental wellness plan.    Patient Goals: Learn how to talk to somebody and learn how to manage my anger.   Discharge Plan or Barriers: CSW will assess for appropriate referrals and possible discharge planning. Patient requesting shelter resource information. [facility] pamphlet, Mobile Crisis information, and information provided to patient for additional community support and resources.   Reason for Continuation of Hospitalization: Anxiety Depression Medication stabilization  Suicidal ideation  Estimated Length of Stay: 3-5 days   Attendees: Patient: Dale [name] 03/06/2018 9:17 AM  Physician: Dr. Neita Garnet, MD 03/06/2018 9:17 AM  Nursing: Chrys Racer.Jacinto Reap, RN 03/06/2018 9:17 AM  RN Care Manager: 03/06/2018 9:17 AM  Social Worker: Stephanie Acre, [place] 03/06/2018 9:17 AM  Recreational Therapist:  03/06/2018 9:17 AM  Other:  03/06/2018 9:17 AM  Other:  03/06/2018 9:17 AM  Other: 03/06/2018 9:17 AM    Scribe for Treatment Team: Joellen Jersey, [place] 03/06/2018 9:17 AM

## 2018-03-06 NOTE — Progress Notes (Signed)
Recreation Therapy Notes  Date: 12.2.19 Time: 0930 Location: [address] Dayroom  Group Topic: Stress Management  Goal Area(s) Addresses:  Patient will verbalize importance of using healthy stress management.  Patient will identify positive emotions associated with healthy stress management.   Intervention: Stress Management  Activity :  Meditation.  LRT introduced the stress management technique of meditation.  LRT played [name] meditation dealing with impermanence.  Patients were to listen and follow along as the meditation played to engage in the technique.  Education:  Stress Management, Discharge Planning.   Education Outcome: Acknowledges edcuation/In group clarification offered/Needs additional education  Clinical Observations/Feedback: Pt did not attend group.    Dale [name], LRT/CTRS         Dale [name] 03/06/2018 11:22 AM 
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
## 2018-03-06 NOTE — [facility] Group Notes (Signed)
[facility] Group Notes:  (Nursing/MHT/Case Management/Adjunct)  Date:  03/06/2018  Time:  4:00 PM Type of Therapy:  Nurse Education  Participation Level:  Active  Participation Quality:  Appropriate  Affect:  Appropriate  Cognitive:  Alert and Appropriate  Insight:  Appropriate and Improving  Engagement in Group:  Engaged and Improving  Modes of Intervention:  Discussion and Education  Summary of Progress/Problems: Patient was appropriate and attentive during RN group.  Mostafa Yuan 03/06/2018, 6:09 PM 

## 2018-03-06 NOTE — Plan of Care (Signed)
Nurse discussed anxiety, depression, coping skills with patient. 

## 2018-03-07 DIAGNOSIS — F419 Anxiety disorder, unspecified: Secondary | ICD-10-CM | POA: Diagnosis not present

## 2018-03-07 DIAGNOSIS — G4700 Insomnia, unspecified: Secondary | ICD-10-CM | POA: Diagnosis not present

## 2018-03-07 DIAGNOSIS — F332 Major depressive disorder, recurrent severe without psychotic features: Secondary | ICD-10-CM | POA: Diagnosis not present

## 2018-03-07 LAB — GLUCOSE, CAPILLARY: Glucose-Capillary: 94 mg/dL (ref 70–99)

## 2018-03-07 MED ORDER — PRAZOSIN HCL 2 MG PO CAPS
2.0000 mg | ORAL_CAPSULE | Freq: Every day | ORAL | Status: DC
  Administered 2018-03-07: 2 mg via ORAL
  Filled 2018-03-07 (×2): qty 1
  Filled 2018-03-07: qty 7

## 2018-03-07 MED ORDER — TRAZODONE HCL 100 MG PO TABS
100.0000 mg | ORAL_TABLET | Freq: Every evening | ORAL | Status: DC | PRN
  Administered 2018-03-07: 100 mg via ORAL
  Filled 2018-03-07: qty 1
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
## 2018-03-07 NOTE — Progress Notes (Signed)
D: Pt passive SI/ AH- pt had issues with sleep . Pt stated he was doing ok this evening. . Pt is pleasant and cooperative.  A: Pt was offered support and encouragement. Pt was given scheduled medications. Pt was encourage to attend groups. Q 15 minute checks were done for safety.  R:Pt attends groups and interacts well with peers and staff. Pt is taking medication. Pt has no complaints.Pt receptive to treatment and safety maintained on unit.

## 2018-03-07 NOTE — [facility] Group Notes (Signed)
LCSW Group Therapy Note 03/07/2018 1:09 PM  Type of Therapy/Topic: Group Therapy: Feelings about Diagnosis  Participation Level: Did Not Attend   Description of Group:  This group will allow patients to explore their thoughts and feelings about diagnoses they have received. Patients will be guided to explore their level of understanding and acceptance of these diagnoses. Facilitator will encourage patients to process their thoughts and feelings about the reactions of others to their diagnosis and will guide patients in identifying ways to discuss their diagnosis with significant others in their lives. This group will be process-oriented, with patients participating in exploration of their own experiences, giving and receiving support, and processing challenge from other group members.  Therapeutic Goals: 1. Patient will demonstrate understanding of diagnosis as evidenced by identifying two or more symptoms of the disorder 2. Patient will be able to express two feelings regarding the diagnosis 3. Patient will demonstrate their ability to communicate their needs through discussion and/or role play  Summary of Patient Progress:  Invited, chose not to attend.     Therapeutic Modalities:  Cognitive Behavioral Therapy Brief Therapy Feelings Identification    Marializ Ferrebee LCSWA Clinical Social Worker   

## 2018-03-07 NOTE — Progress Notes (Signed)
Recreation Therapy Notes  Animal-Assisted Activity (AAA) Program Checklist/Progress Notes Patient Eligibility Criteria Checklist & Daily Group note for Rec Tx Intervention  Date: 12.3.19 Time: 1430 Location: [address]   AAA/T Program Assumption of Risk Form signed by Engineer, production or Parent Legal Guardian  YES   Patient is free of allergies or sever asthma  YES   Patient reports no fear of animals  YES   Patient reports no history of cruelty to animals YES   Patient understands his/her participation is voluntary YES   Patient washes hands before animal contact  YES   Patient washes hands after animal contact  YES   Behavioral Response: Engaged  Education: Charity fundraiser, Appropriate Animal Interaction   Education Outcome: Acknowledges understanding/In group clarification offered/Needs additional education.   Clinical Observations/Feedback: Pt attended and participated in activity.    Caroll Rancher, LRT/CTRS         Caroll Rancher A 03/07/2018 3:43 PM
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
## 2018-03-07 NOTE — Plan of Care (Signed)
Patient self inventory- Patient slept poor last night, sleep medication was not requested. Appetite has been fair, energy level low, concentration poor. Depression, hopelessness, and anxiety rated 5, 5, 6. Patient endorses SI "sometimes." No specific plan. Endorses command Ah. 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 Denies physical pain. Patient's goal is to work on "myself."  Patient is compliant with medications prescribed per provider. No side effects noted. Safety is maintained with 15 minute checks as well as environmental checks. Support and encouragement provided. Will continue to monitor.  Problem: Education: Goal: Verbalization of understanding the information provided will improve Outcome: Progressing   Problem: Activity: Goal: Sleeping patterns will improve Outcome: Progressing   Problem: Education: Goal: Emotional status will improve Outcome: Not Progressing Goal: Mental status will improve Outcome: Not Progressing   Problem: Activity: Goal: Interest or engagement in activities will improve Outcome: Not Progressing

## 2018-03-07 NOTE — [facility] Group Notes (Signed)
Adult Psychoeducational Group Note  Date:  03/07/2018 Time:  5:41 AM  Group Topic/Focus:  Wrap-Up Group:   The focus of this group is to help patients review their daily goal of treatment and discuss progress on daily workbooks.  Participation Level:  Active  Participation Quality:  Appropriate and Attentive  Affect:  Appropriate  Cognitive:  Alert and Appropriate  Insight: Appropriate and Good  Engagement in Group:  Engaged  Modes of Intervention:  Discussion and Education  Additional Comments:   Pt attended and participated in wrap up group this evening. Pt rated their day a 6.5/10, due to them feeling groggy in the AM. Pt was eventually able to make it to some group. Pt has an ongoing goal to control the negative thoughts in their head and not to dwell on them.   Chrisandra Netters 03/07/2018, 5:41 AM

## 2018-03-08 ENCOUNTER — Emergency Department (HOSPITAL_COMMUNITY)
Admission: EM | Admit: 2018-03-08 | Discharge: 2018-03-09 | Disposition: A | Discharge status: Home / Self Care | Payer: Self-pay | Attending: Emergency Medicine | Admitting: Emergency Medicine

## 2018-03-08 ENCOUNTER — Encounter (HOSPITAL_COMMUNITY): Payer: Self-pay | Admitting: Emergency Medicine

## 2018-03-08 ENCOUNTER — Inpatient Hospital Stay: Payer: Self-pay | Admitting: Critical Care Medicine

## 2018-03-08 DIAGNOSIS — F319 Bipolar disorder, unspecified: Secondary | ICD-10-CM | POA: Insufficient documentation

## 2018-03-08 DIAGNOSIS — Z86718 Personal history of other venous thrombosis and embolism: Secondary | ICD-10-CM | POA: Insufficient documentation

## 2018-03-08 DIAGNOSIS — Z79899 Other long term (current) drug therapy: Secondary | ICD-10-CM | POA: Insufficient documentation

## 2018-03-08 DIAGNOSIS — F332 Major depressive disorder, recurrent severe without psychotic features: Secondary | ICD-10-CM | POA: Diagnosis not present

## 2018-03-08 DIAGNOSIS — R45850 Homicidal ideations: Secondary | ICD-10-CM | POA: Insufficient documentation

## 2018-03-08 DIAGNOSIS — R45851 Suicidal ideations: Secondary | ICD-10-CM | POA: Insufficient documentation

## 2018-03-08 DIAGNOSIS — I10 Essential (primary) hypertension: Secondary | ICD-10-CM | POA: Insufficient documentation

## 2018-03-08 DIAGNOSIS — R440 Auditory hallucinations: Secondary | ICD-10-CM | POA: Insufficient documentation

## 2018-03-08 DIAGNOSIS — F419 Anxiety disorder, unspecified: Secondary | ICD-10-CM | POA: Diagnosis not present

## 2018-03-08 DIAGNOSIS — F17210 Nicotine dependence, cigarettes, uncomplicated: Secondary | ICD-10-CM | POA: Insufficient documentation

## 2018-03-08 DIAGNOSIS — Z7901 Long term (current) use of anticoagulants: Secondary | ICD-10-CM | POA: Insufficient documentation

## 2018-03-08 DIAGNOSIS — G4700 Insomnia, unspecified: Secondary | ICD-10-CM | POA: Diagnosis not present

## 2018-03-08 DIAGNOSIS — F1490 Cocaine use, unspecified, uncomplicated: Secondary | ICD-10-CM | POA: Insufficient documentation

## 2018-03-08 LAB — RAPID URINE DRUG SCREEN, HOSP PERFORMED
Amphetamines: NOT DETECTED
Barbiturates: NOT DETECTED
Benzodiazepines: NOT DETECTED
Cocaine: NOT DETECTED
OPIATES: NOT DETECTED
Tetrahydrocannabinol: NOT DETECTED

## 2018-03-08 LAB — COMPREHENSIVE METABOLIC PANEL
ALT: 96 U/L — ABNORMAL HIGH (ref 0–44)
AST: 51 U/L — ABNORMAL HIGH (ref 15–41)
Albumin: 3.6 g/dL (ref 3.5–5.0)
Alkaline Phosphatase: 68 U/L (ref 38–126)
Anion gap: 8 (ref 5–15)
BUN: 12 mg/dL (ref 6–20)
CO2: 26 mmol/L (ref 22–32)
Calcium: 9.4 mg/dL (ref 8.9–10.3)
Chloride: 108 mmol/L (ref 98–111)
Creatinine, Ser: 1.25 mg/dL — ABNORMAL HIGH (ref 0.61–1.24)
GFR calc Af Amer: >60 mL/min (ref >60)
GFR calc non Af Amer: >60 mL/min (ref >60)
Glucose, Bld: 105 mg/dL — ABNORMAL HIGH (ref 70–99)
POTASSIUM: 4 mmol/L (ref 3.5–5.1)
Sodium: 142 mmol/L (ref 135–145)
Total Bilirubin: 0.4 mg/dL (ref 0.3–1.2)
Total Protein: 6.5 g/dL (ref 6.5–8.1)

## 2018-03-08 LAB — CBC
HCT: 41.5 % (ref 39.0–52.0)
Hemoglobin: 12.8 g/dL — ABNORMAL LOW (ref 13.0–17.0)
MCH: 27.6 pg (ref 26.0–34.0)
MCHC: 30.8 g/dL (ref 30.0–36.0)
MCV: 89.4 fL (ref 80.0–100.0)
PLATELETS: 255 10*3/uL (ref 150–400)
RBC: 4.64 MIL/uL (ref 4.22–5.81)
RDW: 15.1 % (ref 11.5–15.5)
WBC: 6.2 10*3/uL (ref 4.0–10.5)
nRBC: 0 % (ref 0.0–0.2)

## 2018-03-08 LAB — ETHANOL: Alcohol, Ethyl (B): <10 mg/dL (ref <10)

## 2018-03-08 LAB — ACETAMINOPHEN LEVEL: Acetaminophen (Tylenol), Serum: <10 ug/mL — ABNORMAL LOW (ref 10–30)

## 2018-03-08 LAB — SALICYLATE LEVEL: Salicylate Lvl: <7 mg/dL (ref 2.8–30.0)

## 2018-03-08 LAB — GLUCOSE, CAPILLARY: Glucose-Capillary: 91 mg/dL (ref 70–99)

## 2018-03-08 MED ORDER — APIXABAN 2.5 MG PO TABS: 2.5000 mg | ORAL_TABLET | Freq: Two times a day (BID) | ORAL | Status: DC

## 2018-03-08 MED ORDER — PHENYLEPH-SHARK LIV OIL-MO-PET 0.25-3-14-71.9 % RE OINT: TOPICAL_OINTMENT | Freq: Two times a day (BID) | RECTAL | 0 refills | Status: DC | PRN

## 2018-03-08 MED ORDER — SERTRALINE HCL 50 MG PO TABS
50.0000 mg | ORAL_TABLET | Freq: Every day | ORAL | Status: DC
  Administered 2018-03-09: 50 mg via ORAL
  Filled 2018-03-08: qty 1

## 2018-03-08 MED ORDER — CALCIUM CARBONATE ANTACID 500 MG PO CHEW
2.0000 | CHEWABLE_TABLET | Freq: Two times a day (BID) | ORAL | Status: DC | PRN
  Filled 2018-03-08: qty 2

## 2018-03-08 MED ORDER — HYDROXYZINE HCL 25 MG PO TABS: 25.0000 mg | ORAL_TABLET | Freq: Three times a day (TID) | ORAL | Status: DC | PRN

## 2018-03-08 MED ORDER — PRAZOSIN HCL 2 MG PO CAPS: 2.0000 mg | ORAL_CAPSULE | Freq: Every day | ORAL | 0 refills | Status: DC

## 2018-03-08 MED ORDER — SIMVASTATIN 20 MG PO TABS: 20.0000 mg | ORAL_TABLET | Freq: Every day | ORAL | 0 refills | Status: DC

## 2018-03-08 MED ORDER — METFORMIN HCL 500 MG PO TABS: 500.0000 mg | ORAL_TABLET | Freq: Every day | ORAL | 0 refills | Status: DC

## 2018-03-08 MED ORDER — OLANZAPINE 5 MG PO TABS: ORAL_TABLET | ORAL | 0 refills | Status: DC

## 2018-03-08 MED ORDER — TRAZODONE HCL 50 MG PO TABS: 50.0000 mg | ORAL_TABLET | Freq: Every evening | ORAL | Status: DC | PRN

## 2018-03-08 MED ORDER — NICOTINE 7 MG/24HR TD PT24
7.0000 mg | MEDICATED_PATCH | Freq: Every day | TRANSDERMAL | Status: DC
  Filled 2018-03-08: qty 1

## 2018-03-08 MED ORDER — ACETAMINOPHEN 325 MG PO TABS: 650.0000 mg | ORAL_TABLET | ORAL | Status: DC | PRN

## 2018-03-08 MED ORDER — GABAPENTIN 400 MG PO CAPS
800.0000 mg | ORAL_CAPSULE | Freq: Three times a day (TID) | ORAL | Status: DC
  Administered 2018-03-09 (×2): 800 mg via ORAL
  Filled 2018-03-08 (×2): qty 2

## 2018-03-08 MED ORDER — OLANZAPINE 5 MG PO TABS
10.0000 mg | ORAL_TABLET | Freq: Every day | ORAL | Status: DC
  Administered 2018-03-09: 10 mg via ORAL
  Filled 2018-03-08: qty 1

## 2018-03-08 MED ORDER — PRAZOSIN HCL 2 MG PO CAPS
2.0000 mg | ORAL_CAPSULE | Freq: Every day | ORAL | Status: DC
  Administered 2018-03-09: 2 mg via ORAL
  Filled 2018-03-08: qty 1

## 2018-03-08 MED ORDER — ALUM & MAG HYDROXIDE-SIMETH 200-200-20 MG/5ML PO SUSP: 30.0000 mL | Freq: Four times a day (QID) | ORAL | Status: DC | PRN

## 2018-03-08 MED ORDER — HYDROXYZINE HCL 25 MG PO TABS: 25.0000 mg | ORAL_TABLET | Freq: Three times a day (TID) | ORAL | 0 refills | Status: DC | PRN

## 2018-03-08 MED ORDER — SIMVASTATIN 20 MG PO TABS
20.0000 mg | ORAL_TABLET | Freq: Every day | ORAL | Status: DC
  Filled 2018-03-08: qty 1

## 2018-03-08 MED ORDER — DICYCLOMINE HCL 20 MG PO TABS: 20.0000 mg | ORAL_TABLET | Freq: Two times a day (BID) | ORAL | Status: DC | PRN

## 2018-03-08 MED ORDER — ONDANSETRON HCL 4 MG PO TABS: 4.0000 mg | ORAL_TABLET | Freq: Three times a day (TID) | ORAL | Status: DC | PRN

## 2018-03-08 MED ORDER — APIXABAN 2.5 MG PO TABS
2.5000 mg | ORAL_TABLET | Freq: Two times a day (BID) | ORAL | Status: DC
  Administered 2018-03-09 (×2): 2.5 mg via ORAL
  Filled 2018-03-08 (×2): qty 1

## 2018-03-08 MED ORDER — GABAPENTIN 400 MG PO CAPS: 800.0000 mg | ORAL_CAPSULE | Freq: Three times a day (TID) | ORAL | 0 refills | Status: DC

## 2018-03-08 MED ORDER — DICYCLOMINE HCL 20 MG PO TABS: 20.0000 mg | ORAL_TABLET | Freq: Two times a day (BID) | ORAL | 0 refills | Status: DC | PRN

## 2018-03-08 MED ORDER — METFORMIN HCL 500 MG PO TABS
500.0000 mg | ORAL_TABLET | Freq: Every day | ORAL | Status: DC
  Administered 2018-03-09: 500 mg via ORAL
  Filled 2018-03-08: qty 1

## 2018-03-08 MED ORDER — OLANZAPINE 5 MG PO TABS
5.0000 mg | ORAL_TABLET | Freq: Every day | ORAL | Status: DC
  Administered 2018-03-09: 5 mg via ORAL
  Filled 2018-03-08: qty 1

## 2018-03-08 MED ORDER — PANTOPRAZOLE SODIUM 40 MG PO TBEC
40.0000 mg | DELAYED_RELEASE_TABLET | Freq: Every day | ORAL | Status: DC
  Administered 2018-03-09: 40 mg via ORAL
  Filled 2018-03-08: qty 1

## 2018-03-08 MED ORDER — SERTRALINE HCL 50 MG PO TABS: 50.0000 mg | ORAL_TABLET | Freq: Every day | ORAL | 0 refills | Status: DC

## 2018-03-08 MED ORDER — CALCIUM CARBONATE ANTACID 500 MG PO CHEW: 2.0000 | CHEWABLE_TABLET | Freq: Two times a day (BID) | ORAL | Status: DC | PRN

## 2018-03-08 MED ORDER — TRAZODONE HCL 50 MG PO TABS: 50.0000 mg | ORAL_TABLET | Freq: Every evening | ORAL | 0 refills | Status: DC | PRN

## 2018-03-08 MED ORDER — OMEPRAZOLE 20 MG PO CPDR: 20.0000 mg | DELAYED_RELEASE_CAPSULE | Freq: Every day | ORAL | 0 refills | Status: DC
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
## 2018-03-08 NOTE — Plan of Care (Signed)
  Problem: Education: Goal: Knowledge of [facility] Education information/materials will improve Outcome: Completed/Met Goal: Emotional status will improve Outcome: Completed/Met Goal: Mental status will improve Outcome: Completed/Met Goal: Verbalization of understanding the information provided will improve Outcome: Completed/Met   Problem: Activity: Goal: Interest or engagement in activities will improve Outcome: Completed/Met Goal: Sleeping patterns will improve Outcome: Completed/Met   Problem: Coping: Goal: Ability to verbalize frustrations and anger appropriately will improve Outcome: Completed/Met Goal: Ability to demonstrate self-control will improve Outcome: Completed/Met   Problem: Health Behavior/Discharge Planning: Goal: Identification of resources available to assist in meeting health care needs will improve Outcome: Completed/Met Goal: Compliance with treatment plan for underlying cause of condition will improve Outcome: Completed/Met   Problem: Physical Regulation: Goal: Ability to maintain clinical measurements within normal limits will improve Outcome: Completed/Met   Problem: Safety: Goal: Periods of time without injury will increase Outcome: Completed/Met   Problem: Education: Goal: Ability to make informed decisions regarding treatment will improve Outcome: Completed/Met   Problem: Coping: Goal: Coping ability will improve Outcome: Completed/Met   Problem: Health Behavior/Discharge Planning: Goal: Identification of resources available to assist in meeting health care needs will improve Outcome: Completed/Met   Problem: Medication: Goal: Compliance with prescribed medication regimen will improve Outcome: Completed/Met   Problem: Self-Concept: Goal: Ability to disclose and discuss suicidal ideas will improve Outcome: Completed/Met Goal: Will verbalize positive feelings about self Outcome: Completed/Met   Problem: Education: Goal:  Ability to verbalize precipitating factors for violent behavior will improve Outcome: Completed/Met   Problem: Coping: Goal: Ability to verbalize frustrations and anger appropriately will improve Outcome: Completed/Met   Problem: Health Behavior/Discharge Planning: Goal: Ability to implement measures to prevent violent behavior in the future will improve Outcome: Completed/Met   Problem: Safety: Goal: Ability to demonstrate self-control will improve Outcome: Completed/Met Goal: Ability to redirect hostility and anger into socially appropriate behaviors will improve Outcome: Completed/Met   Problem: Education: Goal: Utilization of techniques to improve thought processes will improve Outcome: Completed/Met Goal: Knowledge of the prescribed therapeutic regimen will improve Outcome: Completed/Met   Problem: Activity: Goal: Interest or engagement in leisure activities will improve Outcome: Completed/Met Goal: Imbalance in normal sleep/wake cycle will improve Outcome: Completed/Met   Problem: Coping: Goal: Coping ability will improve Outcome: Completed/Met Goal: Will verbalize feelings Outcome: Completed/Met   Problem: Health Behavior/Discharge Planning: Goal: Ability to make decisions will improve Outcome: Completed/Met Goal: Compliance with therapeutic regimen will improve Outcome: Completed/Met   Problem: Role Relationship: Goal: Will demonstrate positive changes in social behaviors and relationships Outcome: Completed/Met   Problem: Safety: Goal: Ability to disclose and discuss suicidal ideas will improve Outcome: Completed/Met Goal: Ability to identify and utilize support systems that promote safety will improve Outcome: Completed/Met   Problem: Self-Concept: Goal: Will verbalize positive feelings about self Outcome: Completed/Met Goal: Level of anxiety will decrease Outcome: Completed/Met

## 2018-03-08 NOTE — Progress Notes (Signed)
Nursing discharge note: Patient discharged home per MD order.  Patient received all personal belongings from unit and locker.  Reviewed AVS/transition record with patient and he indicates understanding.  Patient will follow up with [facility].  Patient denies any thoughts of self harm.  He left ambulatory with 2 bus tickets.

## 2018-03-08 NOTE — ED Triage Notes (Signed)
Pt presents to ED for assessment of continuing suicidal/homocidal thoughts, command hallucinations, and paranoia.  Patient states he was d/c'd from [facility] this morning, states he does not feel same at home.

## 2018-03-08 NOTE — ED Notes (Signed)
Please note: Pt's belongings are in 2 bags at [employer] [facility] nurse's station.

## 2018-03-08 NOTE — BHH Suicide Risk Assessment (Signed)
[facility] Discharge Suicide Risk Assessment   Principal Problem: <principal problem not specified> Discharge Diagnoses: Active Problems:   Severe major depression with psychotic features, mood-congruent (HCC)   Total Time spent with patient: 15 minutes  Musculoskeletal: Strength & Muscle Tone: within normal limits Gait & Station: normal Patient leans: N/A  Psychiatric Specialty Exam: Review of Systems  All other systems reviewed and are negative.   Blood pressure 94/65, pulse (!) 108, temperature 98.8 F (37.1 C), temperature source Oral, resp. rate 20, height 5\' 11"  (1.803 m), weight 77.1 kg.Body mass index is 23.71 kg/m.  General Appearance: Casual  Eye Contact::  Fair  Speech:  Normal Rate409  Volume:  Normal  Mood:  Anxious  Affect:  Congruent  Thought Process:  Coherent and Descriptions of Associations: Circumstantial  Orientation:  Full (Time, Place, and Person)  Thought Content:  Logical  Suicidal Thoughts:  No  Homicidal Thoughts:  No  Memory:  Immediate;   Fair Recent;   Fair Remote;   Fair  Judgement:  Intact  Insight:  Fair  Psychomotor Activity:  Normal  Concentration:  Fair  Recall:  [employer] of Knowledge:Fair  Language: Fair  Akathisia:  Negative  Handed:  Right  AIMS (if indicated):     Assets:  Desire for Improvement Physical Health Resilience  Sleep:  Number of Hours: 6.25  Cognition: WNL  ADL's:  Intact   Mental Status Per Nursing Assessment::   On Admission:  Suicidal ideation indicated by patient  Demographic Factors:  Male, Divorced or widowed, Living alone and Unemployed  Loss Factors: Financial problems/change in socioeconomic status  Historical Factors: Impulsivity  Risk Reduction Factors:   Positive coping skills or problem solving skills  Continued Clinical Symptoms:  Depression:   Impulsivity  Cognitive Features That Contribute To Risk:  None    Suicide Risk:  Minimal: No identifiable suicidal ideation.  Patients  presenting with no risk factors but with morbid ruminations; may be classified as minimal risk based on the severity of the depressive symptoms  Follow-up Information    [facility] up.   Contact information: [address] [place] [address] [phone]           Plan Of Care/Follow-up recommendations:  Activity:  ad lib  Antonieta Pert, MD 03/08/2018, 8:37 AM
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
## 2018-03-08 NOTE — BHH Group Notes (Signed)
  Type of Therapy: Mental [employer] Presentation  Participation Level: Did Not Attend    Summary of Progress/Problems: Mental Health [facility]) Speaker came to talk about his personal journey with mental health. Patient did not attend.    Alcario Drought Clinical Social Worker

## 2018-03-08 NOTE — Progress Notes (Signed)
Recreation Therapy Notes  Date: 12.4.19 Time: 0930 Location: [address] Dayroom  Group Topic: Stress Management  Goal Area(s) Addresses:  Patient will verbalize importance of using healthy stress management.  Patient will identify positive emotions associated with healthy stress management.   Intervention: Stress Management  Activity :  Guided Imagery.  LRT introduced the stress management technique of guided imagery.  LRT read a script that allowed patients to envision their peaceful place.  Patients were to follow along as script was read to engage in activity.  Education:  Stress Management, Discharge Planning.   Education Outcome: Acknowledges edcuation/In group clarification offered/Needs additional education  Clinical Observations/Feedback: Pt did not attend group.     Caroll Rancher, LRT/CTRS         Caroll Rancher A 03/08/2018 11:26 AM

## 2018-03-09 LAB — CBG MONITORING, ED: Glucose-Capillary: 95 mg/dL (ref 70–99)
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
## 2018-03-09 NOTE — ED Notes (Signed)
TTS video console set up at bedside for interview.
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
## 2018-03-09 NOTE — Progress Notes (Signed)
Pt seen by [facility] provider, Nehemiah Massed, MD and can be discharged with a bus pass to the [facility] so that he can obtain a [place] ID.  Carney Bern T. Kaylyn Lim, MSW, LCSWA Disposition Clinical Social Work [phone] (cell) [phone] (office)

## 2018-03-09 NOTE — ED Notes (Addendum)
Pt. wanded and pt. Belongings have been inventoried and placed in locker number 6

## 2018-03-09 NOTE — ED Notes (Signed)
TTS video interview in progress .  

## 2018-03-09 NOTE — ED Provider Notes (Signed)
52 year old male presents with suicidal ideations.  Please see previous providers note for full H&P.  Patient is medically cleared with recommendations for outpatient resources from behavioral health.  Patient discharged with return precautions and follow-up information.  He verbalized understanding and agreement to today's plan.  Vitals:   03/09/18 0145 03/09/18 0616  BP: 130/79 116/69  Pulse: 74 74  Resp: 18 16  Temp: 98.4 F (36.9 C) 97.9 F (36.6 C)  SpO2: 96% 94%      Eyvonne Mechanic, PA-C 03/09/18 1310    Raeford Razor, MD 03/09/18 1535

## 2018-03-09 NOTE — Discharge Instructions (Addendum)
Please follow-up with the resources given.  Return as needed for any new or worsening signs or symptoms

## 2018-03-09 NOTE — Progress Notes (Signed)
CSW informed that pt will be needing bus pass to the [facility] in order to get ID. CSW has provided pt with bus pass at this time. There are  No further CSW at this time. CSW signing off.    Dale [name], MSW, LCSW-A Emergency Department Clinical Social Worker [phone]

## 2018-03-09 NOTE — ED Notes (Signed)
BREAKFAST ORDERED  

## 2018-03-11 ENCOUNTER — Emergency Department (HOSPITAL_COMMUNITY): Payer: Self-pay

## 2018-03-11 ENCOUNTER — Other Ambulatory Visit: Payer: Self-pay

## 2018-03-11 ENCOUNTER — Encounter (HOSPITAL_COMMUNITY): Payer: Self-pay

## 2018-03-11 ENCOUNTER — Emergency Department (HOSPITAL_COMMUNITY)
Admission: EM | Admit: 2018-03-11 | Discharge: 2018-03-11 | Disposition: A | Discharge status: Home / Self Care | Payer: Self-pay | Attending: Emergency Medicine | Admitting: Emergency Medicine

## 2018-03-11 ENCOUNTER — Inpatient Hospital Stay (HOSPITAL_COMMUNITY)
Admission: RE | Admit: 2018-03-11 | Discharge: 2018-03-15 | DRG: 885 | Disposition: A | Discharge status: Home / Self Care | Payer: Federal, State, Local not specified - Other | Attending: Psychiatry | Admitting: Psychiatry

## 2018-03-11 DIAGNOSIS — F1490 Cocaine use, unspecified, uncomplicated: Secondary | ICD-10-CM | POA: Diagnosis present

## 2018-03-11 DIAGNOSIS — F515 Nightmare disorder: Secondary | ICD-10-CM | POA: Diagnosis present

## 2018-03-11 DIAGNOSIS — F323 Major depressive disorder, single episode, severe with psychotic features: Secondary | ICD-10-CM | POA: Diagnosis present

## 2018-03-11 DIAGNOSIS — F319 Bipolar disorder, unspecified: Secondary | ICD-10-CM | POA: Insufficient documentation

## 2018-03-11 DIAGNOSIS — E785 Hyperlipidemia, unspecified: Secondary | ICD-10-CM | POA: Diagnosis present

## 2018-03-11 DIAGNOSIS — Z915 Personal history of self-harm: Secondary | ICD-10-CM | POA: Diagnosis not present

## 2018-03-11 DIAGNOSIS — Z560 Unemployment, unspecified: Secondary | ICD-10-CM

## 2018-03-11 DIAGNOSIS — Z833 Family history of diabetes mellitus: Secondary | ICD-10-CM

## 2018-03-11 DIAGNOSIS — F1290 Cannabis use, unspecified, uncomplicated: Secondary | ICD-10-CM | POA: Diagnosis present

## 2018-03-11 DIAGNOSIS — R45851 Suicidal ideations: Secondary | ICD-10-CM | POA: Insufficient documentation

## 2018-03-11 DIAGNOSIS — Z7901 Long term (current) use of anticoagulants: Secondary | ICD-10-CM | POA: Diagnosis not present

## 2018-03-11 DIAGNOSIS — F332 Major depressive disorder, recurrent severe without psychotic features: Secondary | ICD-10-CM | POA: Diagnosis not present

## 2018-03-11 DIAGNOSIS — Z590 Homelessness: Secondary | ICD-10-CM

## 2018-03-11 DIAGNOSIS — F419 Anxiety disorder, unspecified: Secondary | ICD-10-CM | POA: Diagnosis not present

## 2018-03-11 DIAGNOSIS — Z809 Family history of malignant neoplasm, unspecified: Secondary | ICD-10-CM | POA: Diagnosis not present

## 2018-03-11 DIAGNOSIS — Z7984 Long term (current) use of oral hypoglycemic drugs: Secondary | ICD-10-CM

## 2018-03-11 DIAGNOSIS — G8929 Other chronic pain: Secondary | ICD-10-CM | POA: Diagnosis present

## 2018-03-11 DIAGNOSIS — E7801 Familial hypercholesterolemia: Secondary | ICD-10-CM | POA: Diagnosis present

## 2018-03-11 DIAGNOSIS — F1424 Cocaine dependence with cocaine-induced mood disorder: Secondary | ICD-10-CM | POA: Diagnosis not present

## 2018-03-11 DIAGNOSIS — Z86711 Personal history of pulmonary embolism: Secondary | ICD-10-CM | POA: Diagnosis not present

## 2018-03-11 DIAGNOSIS — F4310 Post-traumatic stress disorder, unspecified: Secondary | ICD-10-CM | POA: Diagnosis present

## 2018-03-11 DIAGNOSIS — Z79899 Other long term (current) drug therapy: Secondary | ICD-10-CM | POA: Diagnosis not present

## 2018-03-11 DIAGNOSIS — F17210 Nicotine dependence, cigarettes, uncomplicated: Secondary | ICD-10-CM | POA: Diagnosis present

## 2018-03-11 DIAGNOSIS — R0789 Other chest pain: Secondary | ICD-10-CM

## 2018-03-11 DIAGNOSIS — F333 Major depressive disorder, recurrent, severe with psychotic symptoms: Secondary | ICD-10-CM | POA: Diagnosis present

## 2018-03-11 DIAGNOSIS — G4700 Insomnia, unspecified: Secondary | ICD-10-CM | POA: Diagnosis present

## 2018-03-11 DIAGNOSIS — E1140 Type 2 diabetes mellitus with diabetic neuropathy, unspecified: Secondary | ICD-10-CM | POA: Diagnosis present

## 2018-03-11 DIAGNOSIS — Z888 Allergy status to other drugs, medicaments and biological substances status: Secondary | ICD-10-CM

## 2018-03-11 HISTORY — DX: Headache: R51

## 2018-03-11 HISTORY — DX: Type 2 diabetes mellitus without complications: E11.9

## 2018-03-11 HISTORY — DX: Headache, unspecified: R51.9

## 2018-03-11 LAB — CBC WITH DIFFERENTIAL/PLATELET
Abs Immature Granulocytes: 0.03 10*3/uL (ref 0.00–0.07)
BASOS ABS: 0 10*3/uL (ref 0.0–0.1)
Basophils Relative: 0 %
EOS PCT: 2 %
Eosinophils Absolute: 0.1 10*3/uL (ref 0.0–0.5)
HCT: 42.2 % (ref 39.0–52.0)
Hemoglobin: 13.1 g/dL (ref 13.0–17.0)
Immature Granulocytes: 0 %
LYMPHS PCT: 16 %
Lymphs Abs: 1.1 10*3/uL (ref 0.7–4.0)
MCH: 28.2 pg (ref 26.0–34.0)
MCHC: 31 g/dL (ref 30.0–36.0)
MCV: 90.8 fL (ref 80.0–100.0)
Monocytes Absolute: 0.8 10*3/uL (ref 0.1–1.0)
Monocytes Relative: 11 %
Neutro Abs: 4.8 10*3/uL (ref 1.7–7.7)
Neutrophils Relative %: 71 %
PLATELETS: 276 10*3/uL (ref 150–400)
RBC: 4.65 MIL/uL (ref 4.22–5.81)
RDW: 15.8 % — ABNORMAL HIGH (ref 11.5–15.5)
WBC: 6.9 10*3/uL (ref 4.0–10.5)
nRBC: 0 % (ref 0.0–0.2)

## 2018-03-11 LAB — COMPREHENSIVE METABOLIC PANEL
ALT: 75 U/L — AB (ref 0–44)
AST: 37 U/L (ref 15–41)
Albumin: 4 g/dL (ref 3.5–5.0)
Alkaline Phosphatase: 60 U/L (ref 38–126)
Anion gap: 10 (ref 5–15)
BUN: 23 mg/dL — ABNORMAL HIGH (ref 6–20)
CO2: 22 mmol/L (ref 22–32)
Calcium: 9.5 mg/dL (ref 8.9–10.3)
Chloride: 110 mmol/L (ref 98–111)
Creatinine, Ser: 1.21 mg/dL (ref 0.61–1.24)
GFR calc Af Amer: >60 mL/min (ref >60)
GFR calc non Af Amer: >60 mL/min (ref >60)
Glucose, Bld: 92 mg/dL (ref 70–99)
Potassium: 3.9 mmol/L (ref 3.5–5.1)
Sodium: 142 mmol/L (ref 135–145)
Total Bilirubin: 1 mg/dL (ref 0.3–1.2)
Total Protein: 6.9 g/dL (ref 6.5–8.1)

## 2018-03-11 LAB — I-STAT TROPONIN, ED: Troponin i, poc: 0.01 ng/mL (ref 0.00–0.08)

## 2018-03-11 LAB — RAPID URINE DRUG SCREEN, HOSP PERFORMED
Amphetamines: NOT DETECTED
BARBITURATES: NOT DETECTED
Benzodiazepines: NOT DETECTED
Cocaine: POSITIVE — AB
Opiates: NOT DETECTED
Tetrahydrocannabinol: POSITIVE — AB

## 2018-03-11 LAB — SALICYLATE LEVEL: Salicylate Lvl: <7 mg/dL (ref 2.8–30.0)

## 2018-03-11 LAB — ETHANOL: Alcohol, Ethyl (B): <10 mg/dL (ref <10)

## 2018-03-11 LAB — D-DIMER, QUANTITATIVE: D-Dimer, Quant: 0.44 ug/mL-FEU (ref 0.00–0.50)

## 2018-03-11 LAB — ACETAMINOPHEN LEVEL: Acetaminophen (Tylenol), Serum: <10 ug/mL — ABNORMAL LOW (ref 10–30)

## 2018-03-11 LAB — TROPONIN I

## 2018-03-11 MED ORDER — MAGNESIUM HYDROXIDE 400 MG/5ML PO SUSP: 30.0000 mL | Freq: Every day | ORAL | Status: DC | PRN

## 2018-03-11 MED ORDER — PRAZOSIN HCL 1 MG PO CAPS
2.0000 mg | ORAL_CAPSULE | Freq: Every day | ORAL | Status: DC
  Filled 2018-03-11: qty 2

## 2018-03-11 MED ORDER — NICOTINE 21 MG/24HR TD PT24
21.0000 mg | MEDICATED_PATCH | Freq: Every day | TRANSDERMAL | Status: DC
  Administered 2018-03-11: 21 mg via TRANSDERMAL
  Filled 2018-03-11: qty 1

## 2018-03-11 MED ORDER — TRAZODONE HCL 50 MG PO TABS
50.0000 mg | ORAL_TABLET | Freq: Every evening | ORAL | Status: DC | PRN
  Administered 2018-03-11 – 2018-03-14 (×4): 50 mg via ORAL
  Filled 2018-03-11 (×13): qty 1

## 2018-03-11 MED ORDER — SERTRALINE HCL 50 MG PO TABS
50.0000 mg | ORAL_TABLET | Freq: Every day | ORAL | Status: DC
  Administered 2018-03-11: 50 mg via ORAL
  Filled 2018-03-11: qty 1

## 2018-03-11 MED ORDER — ALUM & MAG HYDROXIDE-SIMETH 200-200-20 MG/5ML PO SUSP: 30.0000 mL | ORAL | Status: DC | PRN

## 2018-03-11 MED ORDER — METFORMIN HCL 500 MG PO TABS: 500.0000 mg | ORAL_TABLET | Freq: Every day | ORAL | Status: DC

## 2018-03-11 MED ORDER — TRAZODONE HCL 50 MG PO TABS: 50.0000 mg | ORAL_TABLET | Freq: Every evening | ORAL | Status: DC | PRN

## 2018-03-11 MED ORDER — ACETAMINOPHEN 325 MG PO TABS: 650.0000 mg | ORAL_TABLET | ORAL | Status: DC | PRN

## 2018-03-11 MED ORDER — APIXABAN 2.5 MG PO TABS
2.5000 mg | ORAL_TABLET | Freq: Two times a day (BID) | ORAL | Status: DC
  Administered 2018-03-11: 2.5 mg via ORAL
  Filled 2018-03-11 (×2): qty 1

## 2018-03-11 MED ORDER — SIMVASTATIN 20 MG PO TABS
20.0000 mg | ORAL_TABLET | Freq: Every day | ORAL | Status: DC
  Administered 2018-03-11: 20 mg via ORAL
  Filled 2018-03-11: qty 1

## 2018-03-11 MED ORDER — OLANZAPINE 5 MG PO TABS: 5.0000 mg | ORAL_TABLET | Freq: Every day | ORAL | Status: DC

## 2018-03-11 MED ORDER — ACETAMINOPHEN 325 MG PO TABS
650.0000 mg | ORAL_TABLET | Freq: Four times a day (QID) | ORAL | Status: DC | PRN
  Administered 2018-03-11 – 2018-03-15 (×2): 650 mg via ORAL
  Filled 2018-03-11 (×2): qty 2

## 2018-03-11 MED ORDER — HYDROXYZINE HCL 25 MG PO TABS
25.0000 mg | ORAL_TABLET | Freq: Three times a day (TID) | ORAL | Status: DC | PRN
  Administered 2018-03-11 – 2018-03-13 (×2): 25 mg via ORAL
  Filled 2018-03-11 (×2): qty 1

## 2018-03-11 MED ORDER — ONDANSETRON HCL 4 MG PO TABS: 4.0000 mg | ORAL_TABLET | Freq: Three times a day (TID) | ORAL | Status: DC | PRN

## 2018-03-11 MED ORDER — ALUM & MAG HYDROXIDE-SIMETH 200-200-20 MG/5ML PO SUSP: 30.0000 mL | Freq: Four times a day (QID) | ORAL | Status: DC | PRN
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
## 2018-03-11 NOTE — ED Provider Notes (Signed)
Patient seen and evaluated by the psychiatric team and cleared from a psychiatric standpoint.  Discharged home in good condition.  Primary care follow-up.  [facility] follow-up.   Azalia Bilis, MD 03/11/18 [phone]
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
## 2018-03-11 NOTE — H&P (Addendum)
Behavioral Health Medical Screening Exam  Dale [name] is an 52 y.o. male who presents to [facility] as walk-in after discharge from [facility]. Patient reports suicidal ideations with a plan to walk into traffic. Reports voices are telling him to kill himself. Case discussed with Dr. Jama Flavors, inpatient recommended.  Total Time spent with patient: 15 minutes  Psychiatric Specialty Exam: Physical Exam  Constitutional: He is oriented to person, place, and time. He appears well-developed and well-nourished. No distress.  HENT:  Head: Normocephalic and atraumatic.  Right Ear: External ear normal.  Left Ear: External ear normal.  Eyes: Pupils are equal, round, and reactive to light.  Respiratory: Effort normal and breath sounds normal.  Musculoskeletal: Normal range of motion.  Neurological: He is alert and oriented to person, place, and time.  Skin: He is not diaphoretic.  Psychiatric: His speech is normal. His mood appears anxious. He is not withdrawn and not actively hallucinating. Thought content is not paranoid and not delusional. Cognition and memory are normal. He expresses impulsivity and inappropriate judgment. He exhibits a depressed mood. He expresses suicidal ideation. He expresses no homicidal ideation. He expresses suicidal plans.    Review of Systems  Constitutional: Negative for chills, fever and weight loss.  Psychiatric/Behavioral: Positive for depression, hallucinations and suicidal ideas. Negative for memory loss and substance abuse. The patient is nervous/anxious and has insomnia.   All other systems reviewed and are negative.   There were no vitals taken for this visit.There is no height or weight on file to calculate BMI.  General Appearance: Casual and Fairly Groomed  Eye Contact:  Minimal  Speech:  Clear and Coherent and Normal Rate  Volume:  Normal  Mood:  Anxious, Depressed, Hopeless and Worthless  Affect:  Congruent and Depressed  Thought Process:  Coherent and Descriptions of  Associations: Intact  Orientation:  Full (Time, Place, and Person)  Thought Content:  Logical and Hallucinations: Auditory  Suicidal Thoughts:  Yes.  with intent/plan  Homicidal Thoughts:  No  Memory:  Immediate;   Fair Recent;   Fair  Judgement:  Impaired  Insight:  Fair  Psychomotor Activity:  Restlessness  Concentration: Concentration: Fair and Attention Span: Fair  Recall:  [employer] of Knowledge:Good  Language: Good  Akathisia:  No  Handed:  Right  AIMS (if indicated):     Assets:  Communication Skills Desire for Improvement Leisure Time Physical Health  Sleep:       Musculoskeletal: Strength & Muscle Tone: within normal limits Gait & Station: normal Patien   Recommendations:  Based on my evaluation the patient does not appear to have an emergency medical condition.  Jackelyn Poling, NP 03/11/2018, 10:52 PM

## 2018-03-11 NOTE — ED Notes (Addendum)
Dale Conn, NP, and Dr. Jama Flavors staffed patient. Patient meets inpatient criteria. Tresa Endo, [facility], patient accepted to Room 405 Bed 1.

## 2018-03-11 NOTE — ED Triage Notes (Addendum)
Pt arrives via [facility] from [facility] with c/o chest pain and auditory hallucinations. Pt states he has been out of his medication for 3 days(Cymbalta, Trazodone, Zoloft). Pt admits to ETOH and cocaine use last night. Pt states that he went to [facility] and was dc'ed when he was dc'ed they did not return his medication.

## 2018-03-11 NOTE — ED Provider Notes (Signed)
Patient reports feeling suicidal.  Plan is to run in front of a car.  He also reports using cocaine earlier today.  Patient also admits to chest pain since 8 AM today.  He is pain-free presently.  Pain last 1 or 2 seconds at a time.  No other associated symptoms. Risk factors include smoking, family history, diabetes. Heart score equals 3  Chest pain felt to be atypical.  Strongly doubt acute coronary syndrome. Patient is medically cleared for psychiatric evaluation.  Low pretest clinical suspicion for pulmonary embolism.  Negative d-dimer   Doug Sou, MD 03/11/18 1524

## 2018-03-11 NOTE — Progress Notes (Addendum)
Patient ID: Dale [name], male   DOB: November 02, 1965, 52 y.o.   MRN: [id number]   Pt was seen and case discussed with treatment team. Pt presented stating he is suicidal. Pt was at [facility] from 11-26 to 03-08-18 and was discharged with follow up instructions. Pt went to [facility] on 03-08-18. He is stating he has not taken his medications in 3 days because when he left [facility] they would not give them back to him. He stated he lives with a friend. He also stated he last used cocaine on Friday. Pt has a follow up appointment on Monday 03-13-2018 at [facility]. Pt has received medications while in the emergency room. Pt is psychiatrically clear.   Laveda Abbe, NP-C 03-11-2018      [phone] Patient seen face-to-face for psychiatric evaluation, chart reviewed and case discussed with the physician extender and developed treatment plan. Reviewed the information documented and agree with the treatment plan. Thedore Mins, MD

## 2018-03-11 NOTE — Progress Notes (Addendum)
Pt admitted [facility] as a walk in to [facility] unit @ 2245 after discharge from [facility].Marland Kitchen Pt was able to ambulate onto the unit with out issue. Pt presents with depressed mood, and flat affect. Complains of AH that are command in type. "they tell me to hurt myself or do something"  Passive SI with plan to run in front of a car. Pt complains of pain bilateral feet, HX of past surgeries on R foot, bilateral neuropathy, carpal tunnel bilateral wrist. Pt complains of increased anxiety and helplessness. Pt request prn medication for insomnia. See MAR for prn medication administration. Assessment completed without issue and consents signed. Pt's belongings searched and placed in locker by staff. Skin assessment completed by this RN and Taffney MHT Per protocol with no abnormalities or contraband found. Large calluses noted bileteral toes and feet. Old surgical scar above R knee.  Pt oriented to room and unit routine, provided unit handbook, toiletries, and folder. Pt provided snack and drink. Pt denied further need at this time. Encouraged to express questions, needs, or concerns.  Pt educated on falls and encouraged to wear nonskid socks when ambulating in the halls.Placed on moderate falls pt stated that he feels unsteady at times due to past surgeries. Gait currently steady and ambulates without issue. Pt also encouraged to call for help if feeling weak or dizzy. Pt verbalized understanding of all education provided. No signs or symptoms of pain or distress noted. Pt in currently in room, verbalizes understanding of education provided. Pt continues to remain safe on the unit, q 15 min observations initiated and in place. RN will continue to monitor.
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
## 2018-03-11 NOTE — ED Notes (Signed)
A.C and charge nurse both  made aware of sitter need for the patient.

## 2018-03-12 ENCOUNTER — Encounter (HOSPITAL_COMMUNITY): Payer: Self-pay

## 2018-03-12 ENCOUNTER — Other Ambulatory Visit: Payer: Self-pay

## 2018-03-12 DIAGNOSIS — F1424 Cocaine dependence with cocaine-induced mood disorder: Secondary | ICD-10-CM

## 2018-03-12 DIAGNOSIS — F4310 Post-traumatic stress disorder, unspecified: Secondary | ICD-10-CM

## 2018-03-12 MED ORDER — BACITRACIN-NEOMYCIN-POLYMYXIN 400-5-5000 EX OINT
TOPICAL_OINTMENT | Freq: Two times a day (BID) | CUTANEOUS | Status: DC
  Administered 2018-03-12: 1 via TOPICAL
  Administered 2018-03-13 (×2): via TOPICAL

## 2018-03-12 MED ORDER — PANTOPRAZOLE SODIUM 40 MG PO TBEC
40.0000 mg | DELAYED_RELEASE_TABLET | Freq: Every day | ORAL | Status: DC
  Administered 2018-03-12 – 2018-03-15 (×4): 40 mg via ORAL
  Filled 2018-03-12 (×8): qty 1

## 2018-03-12 MED ORDER — TRAZODONE HCL 50 MG PO TABS: 50.0000 mg | ORAL_TABLET | Freq: Every evening | ORAL | Status: DC | PRN

## 2018-03-12 MED ORDER — OLANZAPINE 10 MG PO TABS
10.0000 mg | ORAL_TABLET | Freq: Every day | ORAL | Status: DC
  Administered 2018-03-12 – 2018-03-14 (×3): 10 mg via ORAL
  Filled 2018-03-12 (×5): qty 1

## 2018-03-12 MED ORDER — DICYCLOMINE HCL 20 MG PO TABS
20.0000 mg | ORAL_TABLET | Freq: Two times a day (BID) | ORAL | Status: DC | PRN
  Administered 2018-03-12 – 2018-03-14 (×4): 20 mg via ORAL
  Filled 2018-03-12 (×4): qty 1

## 2018-03-12 MED ORDER — APIXABAN 2.5 MG PO TABS
2.5000 mg | ORAL_TABLET | Freq: Two times a day (BID) | ORAL | Status: DC
  Administered 2018-03-12 – 2018-03-15 (×6): 2.5 mg via ORAL
  Filled 2018-03-12 (×10): qty 1

## 2018-03-12 MED ORDER — OLANZAPINE 5 MG PO TABS
5.0000 mg | ORAL_TABLET | ORAL | Status: DC
  Administered 2018-03-13 – 2018-03-15 (×3): 5 mg via ORAL
  Filled 2018-03-12 (×4): qty 1
  Filled 2018-03-12: qty 2
  Filled 2018-03-12: qty 1

## 2018-03-12 MED ORDER — SERTRALINE HCL 50 MG PO TABS
50.0000 mg | ORAL_TABLET | Freq: Every day | ORAL | Status: DC
  Administered 2018-03-12 – 2018-03-15 (×4): 50 mg via ORAL
  Filled 2018-03-12 (×8): qty 1

## 2018-03-12 MED ORDER — SIMVASTATIN 20 MG PO TABS
20.0000 mg | ORAL_TABLET | Freq: Every day | ORAL | Status: DC
  Administered 2018-03-12 – 2018-03-14 (×3): 20 mg via ORAL
  Filled 2018-03-12 (×5): qty 1

## 2018-03-12 MED ORDER — BACITRACIN-NEOMYCIN-POLYMYXIN OINTMENT TUBE
TOPICAL_OINTMENT | CUTANEOUS | Status: AC
  Filled 2018-03-12: qty 14.17

## 2018-03-12 MED ORDER — GABAPENTIN 400 MG PO CAPS
800.0000 mg | ORAL_CAPSULE | Freq: Once | ORAL | Status: AC
  Administered 2018-03-12: 800 mg via ORAL
  Filled 2018-03-12: qty 2

## 2018-03-12 MED ORDER — PRAZOSIN HCL 2 MG PO CAPS
2.0000 mg | ORAL_CAPSULE | Freq: Every day | ORAL | Status: DC
  Administered 2018-03-12 – 2018-03-14 (×3): 2 mg via ORAL
  Filled 2018-03-12 (×5): qty 1

## 2018-03-12 MED ORDER — GABAPENTIN 400 MG PO CAPS
800.0000 mg | ORAL_CAPSULE | Freq: Three times a day (TID) | ORAL | Status: DC
  Administered 2018-03-12 – 2018-03-15 (×9): 800 mg via ORAL
  Filled 2018-03-12 (×18): qty 2

## 2018-03-12 MED ORDER — METFORMIN HCL 500 MG PO TABS
500.0000 mg | ORAL_TABLET | Freq: Every day | ORAL | Status: DC
  Administered 2018-03-13 – 2018-03-15 (×3): 500 mg via ORAL
  Filled 2018-03-12 (×6): qty 1

## 2018-03-12 NOTE — [facility] Suicide Risk Assessment (Signed)
[facility] INPATIENT:  Family/Significant Other Suicide Prevention Education  Suicide Prevention Education:  Patient Refusal for Family/Significant Other Suicide Prevention Education: The patient Dale [name] has refused to provide written consent for family/significant other to be provided Family/Significant Other Suicide Prevention Education during admission and/or prior to discharge.  Physician notified.  Carloyn Jaeger Grossman-Orr 03/12/2018, 2:57 PM
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
## 2018-03-12 NOTE — [facility] Group Notes (Signed)
[facility] LCSW Group Therapy Note  03/12/2018  10:00-11:00AM  Type of Therapy and Topic:  Group Therapy:  Adding Supports Including Being Your Own Support  Participation Level:  Did Not Attend   Description of Group:  Patients in this group were introduced to the concept that additional supports including self-support are an essential part of recovery.  A song entitled "I Need Help!" was played and a group discussion was held in reaction to the idea of needing to add supports.  A song entitled "My Own Hero" was played and a group discussion ensued in which patients stated they could relate to the song and it inspired them to realize they have be willing to help themselves in order to succeed, because other people cannot achieve sobriety or stability for them.  We discussed adding a variety of healthy supports to address the various needs in their lives.  A song was played called "I Know Where I've Been" toward the end of group and used to conduct an inspirational wrap-up to group of remembering how far they have already come in their journey.  Therapeutic Goals: 1)  demonstrate the importance of being a part of one's own support system 2)  discuss reasons people in one's life may eventually be unable to be continually supportive  3)  identify the patient's current support system and   4)  elicit commitments to add healthy supports and to become more conscious of being self-supportive   Summary of Patient Progress:  N/A   Therapeutic Modalities:   Motivational Interviewing Activity  Charli Liberatore J Grossman-Orr       

## 2018-03-12 NOTE — Plan of Care (Signed)
Nurse discussed anxiety;, depression, coping skills and SI thoughts with patient.

## 2018-03-12 NOTE — Plan of Care (Signed)
New admitt

## 2018-03-12 NOTE — Progress Notes (Signed)
ANTICOAGULATION CONSULT NOTE - Follow Up Consult  Pharmacy Consult for Eliquis Indication: Past hx of DVT/PE  Allergies  Allergen Reactions  . Ibuprofen Hives    Patient Measurements: Height: 5\' 11"  (180.3 cm) Weight: 182 lb 15.7 oz (83 kg) IBW/kg (Calculated) : 75.3 Heparin Dosing Weight:   Vital Signs: Temp: 97.7 F (36.5 C) (12/08 0634) Temp Source: Oral (12/08 0634) BP: 104/72 (12/08 1137) Pulse Rate: 58 (12/08 1137)  Labs: Recent Labs    03/11/18 1109 03/11/18 1117  HGB 13.1  --   HCT 42.2  --   PLT 276  --   CREATININE 1.21  --   TROPONINI  --  <0.03    Estimated Creatinine Clearance: 76.9 mL/min (by C-G formula based on SCr of 1.21 mg/dL).   Medications:  Scheduled:  . apixaban  2.5 mg Oral BID  . gabapentin  800 mg Oral TID  . [START ON 03/13/2018] metFORMIN  500 mg Oral Q breakfast  . OLANZapine  10 mg Oral QHS  . [START ON 03/13/2018] OLANZapine  5 mg Oral BH-q7a  . pantoprazole  40 mg Oral Daily  . prazosin  2 mg Oral QHS  . sertraline  50 mg Oral Daily  . simvastatin  20 mg Oral q1800  . traZODone  50 mg Oral QHS,MR X 1   PRN: acetaminophen, alum & mag hydroxide-simeth, hydrOXYzine, magnesium hydroxide, traZODone  Assessment:  51 yo M with hx of DVT/PE  ( 2010) Has been taking Eliquis 2.5mg  BID PTA Goal of Therapy:    Plan:  Follow up with CBC 12/11 Monitor for any signs of bleeding   Loletta Specter 03/12/2018,12:45 PM
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
## 2018-03-12 NOTE — Progress Notes (Signed)
D:  Patient's self inventory sheet, patient has poor sleep, sleep medication not helpful.  Fair appetite, low energy level, poor concentration.  Rated depression, hopeless and anxiety 10.  Denied withdrawals.  SI, sometimes, contracts for safety.  Physical problems, pain, feet, worst pain #10 in past 24 hours.  Goal is "myself staying on my meds.  I feel hopes."  A:  Medications administered per MD orders.  Emotional support and encouragement given patient. R:  Denied HI and visual hallucinations.  Patient stated he continues to have SI thoughts, contracts for safety, no plan while at [facility]  Voices continue to tell him to hurt himself. Safety maintained with 15 minute checks.

## 2018-03-13 DIAGNOSIS — R45851 Suicidal ideations: Secondary | ICD-10-CM

## 2018-03-13 DIAGNOSIS — F332 Major depressive disorder, recurrent severe without psychotic features: Secondary | ICD-10-CM

## 2018-03-13 LAB — GLUCOSE, CAPILLARY: Glucose-Capillary: 106 mg/dL — ABNORMAL HIGH (ref 70–99)

## 2018-03-13 NOTE — Progress Notes (Signed)
Recreation Therapy Notes  Date: 12.9.19 Time: 0930 Location: [address] Dayroom  Group Topic: Stress Management  Goal Area(s) Addresses:  Patient will verbalize importance of using healthy stress management.  Patient will identify positive emotions associated with healthy stress management.   Intervention: Stress Management  Activity :  Guided Imagery.  LRT introduced the stress management technique of guided imagery.  LRT read a script for patients to visualize being at the beach.  Patients were to follow along as script was read.  Education:  Stress Management, Discharge Planning.   Education Outcome: Acknowledges edcuation/In group clarification offered/Needs additional education  Clinical Observations/Feedback: Pt did not attend activity.     Caroll Rancher, LRT/CTRS         Caroll Rancher A 03/13/2018 12:39 PM

## 2018-03-13 NOTE — Progress Notes (Signed)
Writer has observed patient up in the dayroom watching TV and minimal interaction with peers. Writer informed him of meds due and he inquired about a cream for his foot, his bentyl and his 3rd does of gabapentin. Patient received his medications and neosporin for his callous on bottom of his foot. He remained in th e dayroom until it closed. Safety maintained on unit wth 15 min checks.

## 2018-03-13 NOTE — [facility] Group Notes (Signed)
LCSW Group Therapy Note   03/13/2018 1:15pm   Type of Therapy and Topic:  Group Therapy:  Overcoming Obstacles   Participation Level:  Did Not Attend--pt invited. Chose to remain in bed.    Description of Group:    In this group patients will be encouraged to explore what they see as obstacles to their own wellness and recovery. They will be guided to discuss their thoughts, feelings, and behaviors related to these obstacles. The group will process together ways to cope with barriers, with attention given to specific choices patients can make. Each patient will be challenged to identify changes they are motivated to make in order to overcome their obstacles. This group will be process-oriented, with patients participating in exploration of their own experiences as well as giving and receiving support and challenge from other group members.   Therapeutic Goals: 1. Patient will identify personal and current obstacles as they relate to admission. 2. Patient will identify barriers that currently interfere with their wellness or overcoming obstacles.  3. Patient will identify feelings, thought process and behaviors related to these barriers. 4. Patient will identify two changes they are willing to make to overcome these obstacles:      Summary of Patient Progress   x   Therapeutic Modalities:   Cognitive Behavioral Therapy Solution Focused Therapy Motivational Interviewing Relapse Prevention Therapy  Rona Ravens, LCSW 03/13/2018 11:03 AM

## 2018-03-13 NOTE — Plan of Care (Signed)
Nurse discussed anxiety, depression, coping skills with patient. 

## 2018-03-13 NOTE — Progress Notes (Addendum)
D:  Patient's self inventory sheet, patient has fair sleep, no sleep medication.  Fair appetite, low energy level, poor concentration.  Rated depression 8, anxiety and hopeless 7.  Denied withdrawals #7.  SI sometimes, contracts for safety.  Physical problems, R foot worse in past 24 hours #8.  Goal is "myself".  Plans to "open up". 'Still hearing voices. A:  Medications administered per MD orders.  Emotional support and encouragement given patient. R:  SI off/on, contracts for safety.  Denied HI.  Has seen spots or dots today.  Voices continue. Safety maintained with 15 minte chikd

## 2018-03-13 NOTE — [facility] Suicide Risk Assessment (Signed)
[facility] INPATIENT:  Family/Significant Other Suicide Prevention Education  Suicide Prevention Education:  Patient Refusal for Family/Significant Other Suicide Prevention Education: The patient Dale [name] has refused to provide written consent for family/significant other to be provided Family/Significant Other Suicide Prevention Education during admission and/or prior to discharge.  Physician notified.  SPE completed with pt, as pt refused to consent to family contact. SPI pamphlet provided to pt and pt was encouraged to share information with support network, ask questions, and talk about any concerns relating to SPE. Pt denies access to guns/firearms and verbalized understanding of information provided. Mobile Crisis information also provided to pt.   Rona Ravens LCSW 03/13/2018, 8:53 AM
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
## 2018-03-13 NOTE — Tx Team (Signed)
Interdisciplinary Treatment and Diagnostic Plan Update  03/13/2018 Time of Session: 0830AM Dale [name] MRN: [id number]  Principal Diagnosis: MDD, recurrent, severe, with psychosis  Secondary Diagnoses: Active Problems:   Severe recurrent major depression w/psychotic features, mood-congruent (HCC)   Current Medications:  Current Facility-Administered Medications  Medication Dose Route Frequency Provider Last Rate Last Dose  . acetaminophen (TYLENOL) tablet 650 mg  650 mg Oral Q6H PRN Nira Conn A, NP   650 mg at 03/11/18 2329  . alum & mag hydroxide-simeth (MAALOX/MYLANTA) 200-200-20 MG/5ML suspension 30 mL  30 mL Oral Q4H PRN Nira Conn A, NP      . apixaban (ELIQUIS) tablet 2.5 mg  2.5 mg Oral BID Cobos, Rockey Situ, MD   2.5 mg at 03/13/18 0825  . dicyclomine (BENTYL) tablet 20 mg  20 mg Oral BID PRN Nira Conn A, NP   20 mg at 03/13/18 4098  . gabapentin (NEURONTIN) capsule 800 mg  800 mg Oral TID Cobos, Rockey Situ, MD   800 mg at 03/13/18 0829  . hydrOXYzine (ATARAX/VISTARIL) tablet 25 mg  25 mg Oral TID PRN Jackelyn Poling, NP   25 mg at 03/11/18 2329  . magnesium hydroxide (MILK OF MAGNESIA) suspension 30 mL  30 mL Oral Daily PRN Nira Conn A, NP      . metFORMIN (GLUCOPHAGE) tablet 500 mg  500 mg Oral Q breakfast Cobos, Rockey Situ, MD   500 mg at 03/13/18 1191  . neomycin-bacitracin-polymyxin (NEOSPORIN) ointment   Topical BID Nira Conn A, NP      . OLANZapine (ZYPREXA) tablet 10 mg  10 mg Oral QHS Cobos, Rockey Situ, MD   10 mg at 03/12/18 2144  . OLANZapine (ZYPREXA) tablet 5 mg  5 mg Oral BH-q7a Cobos, Rockey Situ, MD   5 mg at 03/13/18 4782  . pantoprazole (PROTONIX) EC tablet 40 mg  40 mg Oral Daily Cobos, Rockey Situ, MD   40 mg at 03/13/18 0636  . prazosin (MINIPRESS) capsule 2 mg  2 mg Oral QHS Cobos, Rockey Situ, MD   2 mg at 03/12/18 2143  . sertraline (ZOLOFT) tablet 50 mg  50 mg Oral Daily Cobos, Rockey Situ, MD   50 mg at 03/13/18 0826  . simvastatin (ZOCOR)  tablet 20 mg  20 mg Oral q1800 Cobos, Rockey Situ, MD   20 mg at 03/12/18 1835  . traZODone (DESYREL) tablet 50 mg  50 mg Oral QHS,MR X 1 Nira Conn A, NP   50 mg at 03/12/18 2143   PTA Medications: Medications Prior to Admission  Medication Sig Dispense Refill Last Dose  . apixaban (ELIQUIS) 2.5 MG TABS tablet Take 1 tablet (2.5 mg total) by mouth 2 (two) times daily. For blood clot prevention   03/10/2018 at Unknown time  . calcium carbonate (TUMS - DOSED IN MG ELEMENTAL CALCIUM) 500 MG chewable tablet Chew 2 tablets (400 mg of elemental calcium total) by mouth 2 (two) times daily as needed for indigestion or heartburn. (May buy from over the counter: For heart burn (Patient not taking: Reported on 03/09/2018)   Not Taking at Unknown time  . dicyclomine (BENTYL) 20 MG tablet Take 1 tablet (20 mg total) by mouth 2 (two) times daily as needed for spasms. 1 tablet 0 Past Week at Unknown time  . DULOXETINE HCL PO Take 1 tablet by mouth daily. Unknown dose   03/11/2018 at Unknown time  . gabapentin (NEURONTIN) 400 MG capsule Take 2 capsules (800 mg total) by mouth 3 (  three) times daily. For agitation/diabetic neuropathy 180 capsule 0 Past Week at Unknown time  . metFORMIN (GLUCOPHAGE) 500 MG tablet Take 1 tablet (500 mg total) by mouth daily with breakfast. For diabetes management 30 tablet 0 03/11/2018 at Unknown time  . OLANZapine (ZYPREXA) 5 MG tablet Take 1 tablet (5 mg) by mouth in the morning & 2 Tablets (10 mg) at bedtime: For mood control (Patient not taking: Reported on 03/09/2018) 90 tablet 0 Not Taking at Unknown time  . prazosin (MINIPRESS) 2 MG capsule Take 1 capsule (2 mg total) by mouth at bedtime. For nightmares 30 capsule 0 Past Week at Unknown time  . sertraline (ZOLOFT) 50 MG tablet Take 1 tablet (50 mg total) by mouth daily. For depression 30 tablet 0 Past Week at Unknown time  . simvastatin (ZOCOR) 20 MG tablet Take 1 tablet (20 mg total) by mouth daily at 6 PM. For high cholesterol  (Patient not taking: Reported on 03/09/2018) 30 tablet 0 Not Taking at Unknown time  . traZODone (DESYREL) 50 MG tablet Take 1 tablet (50 mg total) by mouth at bedtime as needed for sleep. 30 tablet 0 Past Week at Unknown time    Patient Stressors:    Patient Strengths:    Treatment Modalities: Medication Management, Group therapy, Case management,  1 to 1 session with clinician, Psychoeducation, Recreational therapy.   Physician Treatment Plan for Primary Diagnosis:  MDD, recurrent, severe, with psychosis Long Term Goal(s): Improvement in symptoms so as ready for discharge Improvement in symptoms so as ready for discharge   Short Term Goals: Ability to identify changes in lifestyle to reduce recurrence of condition will improve Ability to maintain clinical measurements within normal limits will improve Ability to identify changes in lifestyle to reduce recurrence of condition will improve Ability to verbalize feelings will improve Ability to disclose and discuss suicidal ideas Ability to demonstrate self-control will improve Ability to identify and develop effective coping behaviors will improve Ability to maintain clinical measurements within normal limits will improve  Medication Management: Evaluate patient's response, side effects, and tolerance of medication regimen.  Therapeutic Interventions: 1 to 1 sessions, Unit Group sessions and Medication administration.  Evaluation of Outcomes: Progressing  Physician Treatment Plan for Secondary Diagnosis: Active Problems:   Severe recurrent major depression w/psychotic features, mood-congruent (HCC)  Long Term Goal(s): Improvement in symptoms so as ready for discharge Improvement in symptoms so as ready for discharge   Short Term Goals: Ability to identify changes in lifestyle to reduce recurrence of condition will improve Ability to maintain clinical measurements within normal limits will improve Ability to identify changes in  lifestyle to reduce recurrence of condition will improve Ability to verbalize feelings will improve Ability to disclose and discuss suicidal ideas Ability to demonstrate self-control will improve Ability to identify and develop effective coping behaviors will improve Ability to maintain clinical measurements within normal limits will improve     Medication Management: Evaluate patient's response, side effects, and tolerance of medication regimen.  Therapeutic Interventions: 1 to 1 sessions, Unit Group sessions and Medication administration.  Evaluation of Outcomes: Progressing   RN Treatment Plan for Primary Diagnosis:  MDD, recurrent, severe, with psychosis Long Term Goal(s): Knowledge of disease and therapeutic regimen to maintain health will improve  Short Term Goals: Ability to remain free from injury will improve, Ability to verbalize frustration and anger appropriately will improve, Ability to demonstrate self-control and Ability to disclose and discuss suicidal ideas  Medication Management: RN will administer medications as  ordered by provider, will assess and evaluate patient's response and provide education to patient for prescribed medication. RN will report any adverse and/or side effects to prescribing provider.  Therapeutic Interventions: 1 on 1 counseling sessions, Psychoeducation, Medication administration, Evaluate responses to treatment, Monitor vital signs and CBGs as ordered, Perform/monitor CIWA, COWS, AIMS and Fall Risk screenings as ordered, Perform wound care treatments as ordered.  Evaluation of Outcomes: Progressing   LCSW Treatment Plan for Primary Diagnosis:  MDD, recurrent, severe, with psychosis Long Term Goal(s): Safe transition to appropriate next level of care at discharge, Engage patient in therapeutic group addressing interpersonal concerns.  Short Term Goals: Engage patient in aftercare planning with referrals and resources, Facilitate patient  progression through stages of change regarding substance use diagnoses and concerns and Identify triggers associated with mental health/substance abuse issues  Therapeutic Interventions: Assess for all discharge needs, 1 to 1 time with Social worker, Explore available resources and support systems, Assess for adequacy in community support network, Educate family and significant other(s) on suicide prevention, Complete Psychosocial Assessment, Interpersonal group therapy.  Evaluation of Outcomes: Progressing   Progress in Treatment: Attending groups: No. New to unit. Continuing to assess.  Participating in groups: No. Taking medication as prescribed: Yes. Toleration medication: Yes. Family/Significant other contact made: SPE completed with pt; pt declined to consent to collateral contact.  Patient understands diagnosis: Yes. Discussing patient identified problems/goals with staff: Yes. Medical problems stabilized or resolved: Yes. Denies suicidal/homicidal ideation: Passive SI/Able to contract for safety on the unit.  Issues/concerns per patient self-inventory: No. Other: n/a   New problem(s) identified: No, Describe:  n/a  New Short Term/Long Term Goal(s): detox, medication management for mood stabilization; elimination of SI thoughts; development of comprehensive mental wellness/sobriety plan.   Patient Goals:  "I need help for depression and suicidal thoughts. Voices are getting bad."   Discharge Plan or Barriers: CSW assessing. Pt was recently discharged from [facility] and had follow-up in place at [facility]. [facility] pamphlet, Mobile Crisis information, and AA/NA information provided to patient for additional community support and resources.   Reason for Continuation of Hospitalization: Anxiety Depression Medication stabilization Suicidal ideation Withdrawal symptoms  Estimated Length of Stay: Thursday, 03/16/18  Attendees: Patient: Dale [name] 03/13/2018 8:54 AM  Physician: Dr. Jola Babinski  MD 03/13/2018 8:54 AM  Nursing: Ricard Dillon; [place] RN 03/13/2018 8:54 AM  RN Care Manager:x 03/13/2018 8:54 AM  Social Worker: Corrie Mckusick LCSW 03/13/2018 8:54 AM  Recreational Therapist: x 03/13/2018 8:54 AM  Other: Armandina Stammer NP 03/13/2018 8:54 AM  Other:  03/13/2018 8:54 AM  Other: 03/13/2018 8:54 AM    Scribe for Treatment Team: Rona Ravens, LCSW 03/13/2018 8:54 AM

## 2018-03-14 DIAGNOSIS — G4700 Insomnia, unspecified: Secondary | ICD-10-CM

## 2018-03-14 DIAGNOSIS — F419 Anxiety disorder, unspecified: Secondary | ICD-10-CM

## 2018-03-14 DIAGNOSIS — F333 Major depressive disorder, recurrent, severe with psychotic symptoms: Principal | ICD-10-CM

## 2018-03-14 MED ORDER — ENSURE ENLIVE PO LIQD
237.0000 mL | Freq: Two times a day (BID) | ORAL | Status: DC
  Administered 2018-03-14: 237 mL via ORAL

## 2018-03-14 MED ORDER — PHENYLEPH-SHARK LIV OIL-MO-PET 0.25-3-14-71.9 % RE OINT
TOPICAL_OINTMENT | Freq: Two times a day (BID) | RECTAL | Status: DC | PRN
  Filled 2018-03-14: qty 28.4

## 2018-03-14 MED ORDER — BUSPIRONE HCL 15 MG PO TABS
15.0000 mg | ORAL_TABLET | Freq: Three times a day (TID) | ORAL | Status: DC
  Administered 2018-03-14 – 2018-03-15 (×4): 15 mg via ORAL
  Filled 2018-03-14 (×7): qty 1
  Filled 2018-03-14 (×2): qty 3
  Filled 2018-03-14 (×3): qty 1
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
## 2018-03-14 NOTE — B[facility] Group Notes (Signed)
[facility] Group Therapy 03/14/2018 1:15pm  Type of Therapy: Mental Health Association Presentation  Participation Level: Active  Participation Quality: Attentive  Affect: Appropriate  Cognitive: Oriented  Insight: Developing/Improving  Engagement in Therapy: Engaged  Modes of Intervention: Discussion, Education and Socialization  Summary of Progress/Problems: Mental Health Association (MHA) Speaker came to talk about his personal journey with mental health. The pt processed ways by which to relate to the speaker. [name] provided handouts and educational information pertaining to groups and services offered by the [facility]. Pt was engaged in speaker's presentation and was receptive to resources provided.    Rona Ravens, LCSW 03/14/2018 2:18 PM

## 2018-03-14 NOTE — Plan of Care (Signed)
  Problem: Education: Goal: Emotional status will improve Outcome: Not Progressing   Problem: Activity: Goal: Interest or engagement in activities will improve Outcome: Not Progressing   Problem: Safety: Goal: Periods of time without injury will increase Outcome: Progressing  DAR NOTE: Patient presents with anxious affect and irritable mood.  Endorsing suicidal thoughts, pain, auditory and visual hallucinations.  Verbally contracts for safety.  Described energy level as low and concentration as poor.  Rates depression at 10, hopelessness at 10, and anxiety at 10.  Maintained on routine safety checks.  Medications given as prescribed.  Support and encouragement offered as needed.  Attended group and participated.  States goal for today is "myself."  Patient visible in milieu with minimal interaction.  Forward little during assessment.  Patient remained safe on the unit.

## 2018-03-14 NOTE — Progress Notes (Signed)
The patient attended the evening A.A.meeting and was appropriate.  

## 2018-03-14 NOTE — Progress Notes (Signed)
Pt reported his toe nail was loose and was bleeding    He said he was washing his feet and he rubbed the washcloth across it and it became loose   A small amount of blood was observed on the tissue he had wrapped around his toe    After looking at his toe it was observed the toe nail was loose but had stopped bleeding   Gauze was placed on his toe and tape wrapped around it   Pt put his sock back on and he had already put antibiotic ointment on his feet   NP made aware of situation   No new orders will report same off and have encouraged pt to speak with his doctor about it tomorrow   Pt agreed to same

## 2018-03-15 LAB — CBC WITH DIFFERENTIAL/PLATELET
Abs Immature Granulocytes: 0.04 10*3/uL (ref 0.00–0.07)
Basophils Absolute: 0 10*3/uL (ref 0.0–0.1)
Basophils Relative: 1 %
Eosinophils Absolute: 0.2 10*3/uL (ref 0.0–0.5)
Eosinophils Relative: 5 %
HCT: 41 % (ref 39.0–52.0)
Hemoglobin: 12.8 g/dL — ABNORMAL LOW (ref 13.0–17.0)
Immature Granulocytes: 1 %
LYMPHS ABS: 1.5 10*3/uL (ref 0.7–4.0)
LYMPHS PCT: 35 %
MCH: 28.8 pg (ref 26.0–34.0)
MCHC: 31.2 g/dL (ref 30.0–36.0)
MCV: 92.3 fL (ref 80.0–100.0)
Monocytes Absolute: 0.5 10*3/uL (ref 0.1–1.0)
Monocytes Relative: 11 %
Neutro Abs: 2 10*3/uL (ref 1.7–7.7)
Neutrophils Relative %: 47 %
Platelets: 245 10*3/uL (ref 150–400)
RBC: 4.44 MIL/uL (ref 4.22–5.81)
RDW: 15 % (ref 11.5–15.5)
WBC: 4.3 10*3/uL (ref 4.0–10.5)
nRBC: 0 % (ref 0.0–0.2)

## 2018-03-15 MED ORDER — BACITRACIN-NEOMYCIN-POLYMYXIN 400-5-5000 EX OINT: TOPICAL_OINTMENT | Freq: Two times a day (BID) | CUTANEOUS | 0 refills | Status: DC

## 2018-03-15 MED ORDER — HYDROXYZINE HCL 25 MG PO TABS: 25.0000 mg | ORAL_TABLET | Freq: Three times a day (TID) | ORAL | 0 refills | Status: DC | PRN

## 2018-03-15 MED ORDER — BUSPIRONE HCL 15 MG PO TABS: 15.0000 mg | ORAL_TABLET | Freq: Three times a day (TID) | ORAL | 0 refills | Status: DC

## 2018-03-15 MED ORDER — GABAPENTIN 400 MG PO CAPS: 800.0000 mg | ORAL_CAPSULE | Freq: Three times a day (TID) | ORAL | 0 refills | Status: DC

## 2018-03-15 MED ORDER — TRAZODONE HCL 50 MG PO TABS: 50.0000 mg | ORAL_TABLET | Freq: Every evening | ORAL | 0 refills | Status: DC | PRN

## 2018-03-15 MED ORDER — METFORMIN HCL 500 MG PO TABS: 500.0000 mg | ORAL_TABLET | Freq: Every day | ORAL | 0 refills | Status: DC

## 2018-03-15 MED ORDER — CALCIUM CARBONATE ANTACID 500 MG PO CHEW: 2.0000 | CHEWABLE_TABLET | Freq: Two times a day (BID) | ORAL | Status: DC | PRN

## 2018-03-15 MED ORDER — PHENYLEPH-SHARK LIV OIL-MO-PET 0.25-3-14-71.9 % RE OINT: TOPICAL_OINTMENT | Freq: Two times a day (BID) | RECTAL | 0 refills | Status: DC | PRN

## 2018-03-15 MED ORDER — APIXABAN 2.5 MG PO TABS: 2.5000 mg | ORAL_TABLET | Freq: Two times a day (BID) | ORAL | 0 refills | Status: DC

## 2018-03-15 MED ORDER — SERTRALINE HCL 50 MG PO TABS: 50.0000 mg | ORAL_TABLET | Freq: Every day | ORAL | 0 refills | Status: DC

## 2018-03-15 MED ORDER — SIMVASTATIN 20 MG PO TABS: 20.0000 mg | ORAL_TABLET | Freq: Every day | ORAL | 0 refills | Status: DC

## 2018-03-15 MED ORDER — PRAZOSIN HCL 2 MG PO CAPS: 2.0000 mg | ORAL_CAPSULE | Freq: Every day | ORAL | 0 refills | Status: DC

## 2018-03-15 MED ORDER — OLANZAPINE 5 MG PO TABS: ORAL_TABLET | ORAL | 0 refills | Status: DC
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
## 2018-03-15 NOTE — Progress Notes (Signed)
Patient ID: Dale [name], male   DOB: [birth year], 52 y.o.   MRN: [id number]  Pt discharged to lobby. Pt was stable and appreciative at that time. All papers and prescriptions were given and valuables returned. Verbal understanding expressed. Denies SI/HI and A/VH. Pt given opportunity to express concerns and ask questions.
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
## 2018-03-15 NOTE — Progress Notes (Signed)
Recreation Therapy Notes  Date: 12.11.19 Time: 0930 Location: [address] Dayroom  Group Topic: Stress Management  Goal Area(s) Addresses:  Patient will verbalize importance of using healthy stress management.  Patient will identify positive emotions associated with healthy stress management.   Intervention: Stress Management  Activity : Meditation.  LRT introduced the stress management technique of meditation.  LRT played a meditation on being resilient.  Patients were to follow along as meditation played to engage in activity.  Education:  Stress Management, Discharge Planning.   Education Outcome: Acknowledges edcuation/In group clarification offered/Needs additional education  Clinical Observations/Feedback: Pt did not attend group.     Caroll Rancher, LRT/CTRS         Lillia Abed, Socorro Ebron A 03/15/2018 10:48 AM

## 2018-03-15 NOTE — Progress Notes (Signed)
Patient ID: Dale [name], male   DOB: 05-03-1965, 52 y.o.   MRN: [id number]   D. Pt presents with an irritable affect and anxious behavior. Pt seen with bandage on right foot from a "ripped off toenail." Pt reports ongoing pain. Pt reports passive SI, states "since this happened I have." referring to his toe. No plan. Pt also endorses ongoing VH.   A. Labs and vitals monitored. Pt given and educated on medications. Pt supported emotionally and encouraged to express concerns and ask questions. MD notified of patients complaints and symptoms. Encouraged pt to work on suicide safety plan, needs reinforcement.   R. Pt remains safe with 15 minute checks. Pt currently denies HI and AH and agrees to contact staff before acting on any harmful thoughts. Will continue POC.

## 2018-03-15 NOTE — [facility] Suicide Risk Assessment (Signed)
[facility] Discharge Suicide Risk Assessment   Principal Problem: PTSD/depressive symptoms/is secondary gain/substance abuse Discharge Diagnoses: Active Problems:   Severe recurrent major depression w/psychotic features, mood-congruent (HCC)   Total Time spent with patient: 45 minutes  Musculoskeletal: Strength & Muscle Tone: within normal limits Gait & Station: normal Patient leans: N/A  Psychiatric Specialty Exam: ROS-has pulled off 1 of his toenails in an attempt to stay hospitalized  Blood pressure (!) 87/74, pulse (!) 101, temperature 98.6 F (37 C), resp. rate 14, height 5\' 11"  (1.803 m), weight 83 kg, SpO2 99 %.Body mass index is 25.52 kg/m.  General Appearance: Casual  Eye Contact::  Minimal  Speech:  Clear and Coherent409  Volume:  Normal  Mood:  Euthymic  Affect:  Congruent  Thought Process:  Goal Directed  Orientation:  Full (Time, Place, and Person)  Thought Content:  Logical  Suicidal Thoughts:  No  Homicidal Thoughts:  No  Memory:  Immediate;   Fair  Judgement:  Fair  Insight:  Fair  Psychomotor Activity:  Normal  Concentration:  Fair  Recall:  [employer] of Knowledge:Fair  Language: Fair  Akathisia:  Negative  Handed:  Right  AIMS (if indicated):     Assets:  Communication Skills Desire for Improvement  Sleep:  Number of Hours: 5.5  Cognition: intact  ADL's:  Intact   Mental Status Per Nursing Assessment::   On Admission:  Suicidal ideation indicated by patient, Self-harm behaviors, Suicide plan  Demographic Factors:  Male  Loss Factors: Decrease in vocational status  Historical Factors: Impulsivity  Risk Reduction Factors:   NA  Continued Clinical Symptoms:  Depression:   Severe  Cognitive Features That Contribute To Risk:  None    Suicide Risk:  Minimal: No identifiable suicidal ideation.  Patients presenting with no risk factors but with morbid ruminations; may be classified as minimal risk based on the severity of the depressive  symptoms  Follow-up Information    [facility]. Go on 03/22/2018.   Specialty:  Behavioral Health Why:  Your next hospital follow up appointment is Wednesday, 03/22/18 at 9:00a.  Contact information: [address] [place] [address] [phone]           Plan Of Care/Follow-up recommendations:  Activity:  full  Lekeith Wulf, MD 03/15/2018, 9:07 AM

## 2018-10-02 ENCOUNTER — Emergency Department (HOSPITAL_COMMUNITY)
Admission: EM | Admit: 2018-10-02 | Discharge: 2018-10-02 | Disposition: A | Discharge status: Home / Self Care | Payer: Self-pay | Attending: Internal Medicine | Admitting: Internal Medicine

## 2018-10-02 ENCOUNTER — Emergency Department (HOSPITAL_COMMUNITY): Payer: Self-pay

## 2018-10-02 ENCOUNTER — Other Ambulatory Visit: Payer: Self-pay

## 2018-10-02 DIAGNOSIS — Z79899 Other long term (current) drug therapy: Secondary | ICD-10-CM | POA: Insufficient documentation

## 2018-10-02 DIAGNOSIS — M5431 Sciatica, right side: Secondary | ICD-10-CM | POA: Insufficient documentation

## 2018-10-02 DIAGNOSIS — F319 Bipolar disorder, unspecified: Secondary | ICD-10-CM | POA: Insufficient documentation

## 2018-10-02 DIAGNOSIS — F17210 Nicotine dependence, cigarettes, uncomplicated: Secondary | ICD-10-CM | POA: Insufficient documentation

## 2018-10-02 DIAGNOSIS — R10811 Right upper quadrant abdominal tenderness: Secondary | ICD-10-CM | POA: Insufficient documentation

## 2018-10-02 DIAGNOSIS — K644 Residual hemorrhoidal skin tags: Secondary | ICD-10-CM | POA: Insufficient documentation

## 2018-10-02 DIAGNOSIS — Z7984 Long term (current) use of oral hypoglycemic drugs: Secondary | ICD-10-CM | POA: Insufficient documentation

## 2018-10-02 DIAGNOSIS — E119 Type 2 diabetes mellitus without complications: Secondary | ICD-10-CM | POA: Insufficient documentation

## 2018-10-02 DIAGNOSIS — Z7901 Long term (current) use of anticoagulants: Secondary | ICD-10-CM | POA: Insufficient documentation

## 2018-10-02 DIAGNOSIS — F1490 Cocaine use, unspecified, uncomplicated: Secondary | ICD-10-CM | POA: Insufficient documentation

## 2018-10-02 DIAGNOSIS — N50811 Right testicular pain: Secondary | ICD-10-CM

## 2018-10-02 DIAGNOSIS — I10 Essential (primary) hypertension: Secondary | ICD-10-CM | POA: Insufficient documentation

## 2018-10-02 DIAGNOSIS — Z86718 Personal history of other venous thrombosis and embolism: Secondary | ICD-10-CM | POA: Insufficient documentation

## 2018-10-02 DIAGNOSIS — F1290 Cannabis use, unspecified, uncomplicated: Secondary | ICD-10-CM | POA: Insufficient documentation

## 2018-10-02 DIAGNOSIS — M5441 Lumbago with sciatica, right side: Secondary | ICD-10-CM | POA: Insufficient documentation

## 2018-10-02 LAB — URINALYSIS, ROUTINE W REFLEX MICROSCOPIC
Bacteria, UA: NONE SEEN
Bilirubin Urine: NEGATIVE
Glucose, UA: NEGATIVE mg/dL
Hgb urine dipstick: NEGATIVE
Ketones, ur: NEGATIVE mg/dL
Nitrite: NEGATIVE
Protein, ur: NEGATIVE mg/dL
Specific Gravity, Urine: 1.011 (ref 1.005–1.030)
pH: 7 (ref 5.0–8.0)

## 2018-10-02 LAB — CBC
HCT: 43.4 % (ref 39.0–52.0)
Hemoglobin: 13.9 g/dL (ref 13.0–17.0)
MCH: 29.3 pg (ref 26.0–34.0)
MCHC: 32 g/dL (ref 30.0–36.0)
MCV: 91.4 fL (ref 80.0–100.0)
Platelets: 237 10*3/uL (ref 150–400)
RBC: 4.75 MIL/uL (ref 4.22–5.81)
RDW: 12.9 % (ref 11.5–15.5)
WBC: 3.9 10*3/uL — ABNORMAL LOW (ref 4.0–10.5)
nRBC: 0 % (ref 0.0–0.2)

## 2018-10-02 LAB — COMPREHENSIVE METABOLIC PANEL
ALT: 20 U/L (ref 0–44)
AST: 26 U/L (ref 15–41)
Albumin: 3.9 g/dL (ref 3.5–5.0)
Alkaline Phosphatase: 63 U/L (ref 38–126)
Anion gap: 6 (ref 5–15)
BUN: 11 mg/dL (ref 6–20)
CO2: 23 mmol/L (ref 22–32)
Calcium: 8.9 mg/dL (ref 8.9–10.3)
Chloride: 111 mmol/L (ref 98–111)
Creatinine, Ser: 1.25 mg/dL — ABNORMAL HIGH (ref 0.61–1.24)
GFR calc Af Amer: >60 mL/min (ref >60)
GFR calc non Af Amer: >60 mL/min (ref >60)
Glucose, Bld: 89 mg/dL (ref 70–99)
Potassium: 3.9 mmol/L (ref 3.5–5.1)
Sodium: 140 mmol/L (ref 135–145)
Total Bilirubin: 0.4 mg/dL (ref 0.3–1.2)
Total Protein: 6.9 g/dL (ref 6.5–8.1)

## 2018-10-02 LAB — LIPASE, BLOOD: Lipase: 23 U/L (ref 11–51)

## 2018-10-02 LAB — POC OCCULT BLOOD, ED: Fecal Occult Bld: NEGATIVE

## 2018-10-02 MED ORDER — MORPHINE SULFATE (PF) 4 MG/ML IV SOLN
4.0000 mg | Freq: Once | INTRAVENOUS | Status: AC
  Administered 2018-10-02: 12:00:00 4 mg via INTRAVENOUS
  Filled 2018-10-02: qty 1

## 2018-10-02 MED ORDER — SODIUM CHLORIDE (PF) 0.9 % IJ SOLN
INTRAMUSCULAR | Status: AC
  Filled 2018-10-02: qty 50

## 2018-10-02 MED ORDER — ONDANSETRON HCL 4 MG/2ML IJ SOLN
4.0000 mg | Freq: Once | INTRAMUSCULAR | Status: AC
  Administered 2018-10-02: 15:00:00 4 mg via INTRAVENOUS
  Filled 2018-10-02: qty 2

## 2018-10-02 MED ORDER — IOHEXOL 350 MG/ML SOLN
100.0000 mL | Freq: Once | INTRAVENOUS | Status: AC | PRN
  Administered 2018-10-02: 100 mL via INTRAVENOUS

## 2018-10-02 MED ORDER — METHOCARBAMOL 500 MG PO TABS: 500.0000 mg | ORAL_TABLET | Freq: Two times a day (BID) | ORAL | 0 refills | Status: DC

## 2018-10-02 MED ORDER — LIDOCAINE 5 % EX PTCH: 1.0000 | MEDICATED_PATCH | CUTANEOUS | 0 refills | Status: DC

## 2018-10-02 MED ORDER — MORPHINE SULFATE (PF) 4 MG/ML IV SOLN
4.0000 mg | Freq: Once | INTRAVENOUS | Status: DC
  Filled 2018-10-02: qty 1

## 2018-10-02 MED ORDER — FENTANYL CITRATE (PF) 100 MCG/2ML IJ SOLN
100.0000 ug | Freq: Once | INTRAMUSCULAR | Status: AC
  Administered 2018-10-02: 100 ug via INTRAVENOUS
  Filled 2018-10-02: qty 2
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
## 2018-10-02 NOTE — Discharge Instructions (Addendum)
You can take Tylenol as directed for pain. You can take the Tylenol every 4 hours.    Take Robaxin as prescribed. This medication will make you drowsy so do not drive or drink alcohol when taking it.  Use Lidoderm patches as directed.   Follow-up with [facility] to establish a primary care doctor if you do not have one.   Emergency department for any worsening pain, numbness/weakness of your arms or legs, chest pain, difficulty breathing, fevers or any other worsening or concerning symptoms.   The ultrasound had the following results: IMPRESSION:  1. Moderate right-sided hydrocele.  2. Small bilateral varicoceles, which could contribute to underlying  testicular pain.  3. No other acute abnormality identified.  No evidence for torsion.  4. Mild left testicular microlithiasis. Current literature suggests  that testicular microlithiasis is not a significant independent risk  factor for development of testicular carcinoma, and that follow up  imaging is not warranted in the absence of other risk factors.  Monthly testicular self-examination and annual physical exams are  considered appropriate surveillance. If patient has other risk  factors for testicular carcinoma, then referral to Urology should be  considered. (Reference: DeCastro, et al.: A 5-Year Follow up Study  of Asymptomatic Men with Testicular Microlithiasis. J Urol 2008;  347:4259-5638.)   May follow up with the urologist on these matters.

## 2018-10-02 NOTE — ED Notes (Signed)
Pt reports he also had a fall on Friday night as he tried to stand to go to the bathroom and had severe right sided pain and fell down. Pt reports he had been drinking socially for his birthday but he does not normally drink alcohol.  Pt states he is concerned it may be his gallbladder. Pt denies urinary symptoms.

## 2018-10-02 NOTE — ED Triage Notes (Signed)
Pt reports he has a hx of crohns and he has seen some blood in his stool. Pt reports pain on the right side of his abdomen so intense that he has not been able to comfortably walk.  Pt denies hx of diverticulitis and reports he still has his appendix and galbladder

## 2018-10-02 NOTE — ED Notes (Signed)
He informs me he is very upset that "you haven't fed me anything yet; and I've been up since 6!" I find his level of fury strange, as he seems much more concerned about food than his pain. I also explain that often, in medicine, it is actually desirable for tests to be accurate, etc. He remains very angry. He tells me he needs "to have something on my stomach to get any more pain medicine". I inform him I will be back in a few minutes to give his pain medicine (IV Fentanyl). Also, he has just competed the testicular u/s.

## 2018-10-21 ENCOUNTER — Other Ambulatory Visit: Payer: Self-pay

## 2018-10-21 ENCOUNTER — Encounter (HOSPITAL_COMMUNITY): Payer: Self-pay | Admitting: *Deleted

## 2018-10-21 ENCOUNTER — Inpatient Hospital Stay (HOSPITAL_COMMUNITY)
Admission: RE | Admit: 2018-10-21 | Discharge: 2018-10-30 | DRG: 885 | Disposition: A | Discharge status: Home / Self Care | Payer: Federal, State, Local not specified - Other | Attending: Psychiatry | Admitting: Psychiatry

## 2018-10-21 DIAGNOSIS — Z86718 Personal history of other venous thrombosis and embolism: Secondary | ICD-10-CM | POA: Diagnosis not present

## 2018-10-21 DIAGNOSIS — Z62810 Personal history of physical and sexual abuse in childhood: Secondary | ICD-10-CM | POA: Diagnosis present

## 2018-10-21 DIAGNOSIS — I10 Essential (primary) hypertension: Secondary | ICD-10-CM | POA: Diagnosis present

## 2018-10-21 DIAGNOSIS — Z888 Allergy status to other drugs, medicaments and biological substances status: Secondary | ICD-10-CM

## 2018-10-21 DIAGNOSIS — E119 Type 2 diabetes mellitus without complications: Secondary | ICD-10-CM | POA: Diagnosis present

## 2018-10-21 DIAGNOSIS — Z560 Unemployment, unspecified: Secondary | ICD-10-CM

## 2018-10-21 DIAGNOSIS — G4700 Insomnia, unspecified: Secondary | ICD-10-CM | POA: Diagnosis present

## 2018-10-21 DIAGNOSIS — Z20828 Contact with and (suspected) exposure to other viral communicable diseases: Secondary | ICD-10-CM | POA: Diagnosis present

## 2018-10-21 DIAGNOSIS — Z79899 Other long term (current) drug therapy: Secondary | ICD-10-CM | POA: Diagnosis not present

## 2018-10-21 DIAGNOSIS — G8929 Other chronic pain: Secondary | ICD-10-CM | POA: Diagnosis present

## 2018-10-21 DIAGNOSIS — Z7901 Long term (current) use of anticoagulants: Secondary | ICD-10-CM

## 2018-10-21 DIAGNOSIS — K219 Gastro-esophageal reflux disease without esophagitis: Secondary | ICD-10-CM | POA: Diagnosis present

## 2018-10-21 DIAGNOSIS — Z7984 Long term (current) use of oral hypoglycemic drugs: Secondary | ICD-10-CM

## 2018-10-21 DIAGNOSIS — F4310 Post-traumatic stress disorder, unspecified: Secondary | ICD-10-CM | POA: Diagnosis present

## 2018-10-21 DIAGNOSIS — Z833 Family history of diabetes mellitus: Secondary | ICD-10-CM | POA: Diagnosis not present

## 2018-10-21 DIAGNOSIS — M5432 Sciatica, left side: Secondary | ICD-10-CM | POA: Diagnosis present

## 2018-10-21 DIAGNOSIS — F332 Major depressive disorder, recurrent severe without psychotic features: Secondary | ICD-10-CM | POA: Diagnosis present

## 2018-10-21 DIAGNOSIS — Z915 Personal history of self-harm: Secondary | ICD-10-CM | POA: Diagnosis not present

## 2018-10-21 DIAGNOSIS — F17210 Nicotine dependence, cigarettes, uncomplicated: Secondary | ICD-10-CM | POA: Diagnosis present

## 2018-10-21 DIAGNOSIS — F411 Generalized anxiety disorder: Secondary | ICD-10-CM | POA: Diagnosis present

## 2018-10-21 DIAGNOSIS — R45851 Suicidal ideations: Secondary | ICD-10-CM | POA: Diagnosis present

## 2018-10-21 DIAGNOSIS — G629 Polyneuropathy, unspecified: Secondary | ICD-10-CM | POA: Diagnosis present

## 2018-10-21 DIAGNOSIS — Z86711 Personal history of pulmonary embolism: Secondary | ICD-10-CM

## 2018-10-21 DIAGNOSIS — B070 Plantar wart: Secondary | ICD-10-CM | POA: Diagnosis present

## 2018-10-21 DIAGNOSIS — Z809 Family history of malignant neoplasm, unspecified: Secondary | ICD-10-CM

## 2018-10-21 DIAGNOSIS — Z87820 Personal history of traumatic brain injury: Secondary | ICD-10-CM

## 2018-10-21 DIAGNOSIS — M549 Dorsalgia, unspecified: Secondary | ICD-10-CM | POA: Diagnosis present

## 2018-10-21 DIAGNOSIS — K5090 Crohn's disease, unspecified, without complications: Secondary | ICD-10-CM | POA: Diagnosis present

## 2018-10-21 DIAGNOSIS — R45850 Homicidal ideations: Secondary | ICD-10-CM | POA: Diagnosis present

## 2018-10-21 LAB — CBC
HCT: 45.4 % (ref 39.0–52.0)
Hemoglobin: 14.3 g/dL (ref 13.0–17.0)
MCH: 29.5 pg (ref 26.0–34.0)
MCHC: 31.5 g/dL (ref 30.0–36.0)
MCV: 93.8 fL (ref 80.0–100.0)
Platelets: 225 10*3/uL (ref 150–400)
RBC: 4.84 MIL/uL (ref 4.22–5.81)
RDW: 13.1 % (ref 11.5–15.5)
WBC: 4.9 10*3/uL (ref 4.0–10.5)
nRBC: 0 % (ref 0.0–0.2)

## 2018-10-21 LAB — HEMOGLOBIN A1C
Hgb A1c MFr Bld: 5.8 % — ABNORMAL HIGH (ref 4.8–5.6)
Mean Plasma Glucose: 119.76 mg/dL

## 2018-10-21 LAB — LIPID PANEL
Cholesterol: 109 mg/dL (ref 0–200)
HDL: 45 mg/dL (ref >40)
LDL Cholesterol: 55 mg/dL (ref 0–99)
Total CHOL/HDL Ratio: 2.4 RATIO
Triglycerides: 46 mg/dL (ref <150)
VLDL: 9 mg/dL (ref 0–40)

## 2018-10-21 LAB — COMPREHENSIVE METABOLIC PANEL
ALT: 20 U/L (ref 0–44)
AST: 26 U/L (ref 15–41)
Albumin: 3.7 g/dL (ref 3.5–5.0)
Alkaline Phosphatase: 58 U/L (ref 38–126)
Anion gap: 9 (ref 5–15)
BUN: 20 mg/dL (ref 6–20)
CO2: 27 mmol/L (ref 22–32)
Calcium: 9.2 mg/dL (ref 8.9–10.3)
Chloride: 108 mmol/L (ref 98–111)
Creatinine, Ser: 1.49 mg/dL — ABNORMAL HIGH (ref 0.61–1.24)
GFR calc Af Amer: >60 mL/min (ref >60)
GFR calc non Af Amer: 53 mL/min — ABNORMAL LOW (ref >60)
Glucose, Bld: 99 mg/dL (ref 70–99)
Potassium: 4.5 mmol/L (ref 3.5–5.1)
Sodium: 144 mmol/L (ref 135–145)
Total Bilirubin: 0.2 mg/dL — ABNORMAL LOW (ref 0.3–1.2)
Total Protein: 6.5 g/dL (ref 6.5–8.1)

## 2018-10-21 LAB — SARS CORONAVIRUS 2 BY RT PCR (HOSPITAL ORDER, PERFORMED IN ~~LOC~~ HOSPITAL LAB): SARS Coronavirus 2: NEGATIVE

## 2018-10-21 LAB — TSH: TSH: 0.932 u[IU]/mL (ref 0.350–4.500)

## 2018-10-21 LAB — ETHANOL: Alcohol, Ethyl (B): <10 mg/dL (ref <10)

## 2018-10-21 MED ORDER — ACETAMINOPHEN 325 MG PO TABS
650.0000 mg | ORAL_TABLET | Freq: Four times a day (QID) | ORAL | Status: DC | PRN
  Administered 2018-10-27 – 2018-10-30 (×3): 650 mg via ORAL
  Filled 2018-10-21 (×4): qty 2

## 2018-10-21 MED ORDER — GABAPENTIN 400 MG PO CAPS
800.0000 mg | ORAL_CAPSULE | Freq: Three times a day (TID) | ORAL | Status: DC
  Administered 2018-10-21 – 2018-10-23 (×5): 800 mg via ORAL
  Filled 2018-10-21 (×10): qty 2

## 2018-10-21 MED ORDER — ALUM & MAG HYDROXIDE-SIMETH 200-200-20 MG/5ML PO SUSP: 30.0000 mL | ORAL | Status: DC | PRN

## 2018-10-21 MED ORDER — HYDROXYZINE HCL 25 MG PO TABS
25.0000 mg | ORAL_TABLET | Freq: Three times a day (TID) | ORAL | Status: DC | PRN
  Administered 2018-10-21 – 2018-10-29 (×6): 25 mg via ORAL
  Filled 2018-10-21 (×8): qty 1

## 2018-10-21 MED ORDER — TRAZODONE HCL 50 MG PO TABS
50.0000 mg | ORAL_TABLET | Freq: Every evening | ORAL | Status: DC | PRN
  Filled 2018-10-21 (×2): qty 1

## 2018-10-21 MED ORDER — MAGNESIUM HYDROXIDE 400 MG/5ML PO SUSP: 30.0000 mL | Freq: Every day | ORAL | Status: DC | PRN

## 2018-10-21 MED ORDER — APIXABAN 2.5 MG PO TABS
2.5000 mg | ORAL_TABLET | Freq: Two times a day (BID) | ORAL | Status: DC
  Administered 2018-10-22 – 2018-10-30 (×17): 2.5 mg via ORAL
  Filled 2018-10-21 (×20): qty 1

## 2018-10-21 NOTE — Progress Notes (Signed)
Covid swab collected and sent to [facility] lab. Admit to inpatient adult unit pending results of covid screen.

## 2018-10-21 NOTE — Progress Notes (Signed)
D.  Pt is new admission that just came in today.  Pt has remained in room much of early shift but did come out and spoke about concern for home medications.  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  Pt also requested vistaril for sleep as well as a pitcher of Gatorade.  Pt states that he has been dehydrated.  Pt endorses passive SI at times but does agree to safety here as inpatient.  Pt denies HI/AVH at this time.  A.  Support and encouragement offered, spoke with NP and new orders received.  Medication given as ordered.  R.  Pt remains safe on the unit, will continue to monitor.

## 2018-10-21 NOTE — BH Assessment (Signed)
[facility] Assessment Progress Note PerParks NP patient is recommended for a inpatient admission.

## 2018-10-21 NOTE — H&P (Addendum)
Behavioral Health Medical Screening Exam  Dale [name] is an 53 y.o. male. He presented to [facility], voluntarily because he has homicidal feelings about the person his girlfriend is seeing. They broke up and he is feeling lost, overwhelmed with thoughts of taking his own life. He stated he attempted to take his life years ago but his mother stopped him. He stated he used to take medication for his mood but has not for about one year. He could not remember what he took. He endorses anxiety, depression, feelings of being overwhelmed and not able to sleep or eat. He recently lost his job. He is asking for help getting on medication and getting his life back under control.   Total Time spent with patient: 20 minutes  Psychiatric Specialty Exam: Physical Exam  Constitutional: He is oriented to person, place, and time. He appears well-developed and well-nourished.  HENT:  Head: Normocephalic.  Respiratory: Effort normal.  Musculoskeletal: Normal range of motion.  Neurological: He is alert and oriented to person, place, and time.  Psychiatric: His speech is normal and behavior is normal. His mood appears anxious. Cognition and memory are normal. He exhibits a depressed mood. He expresses homicidal and suicidal ideation.    Review of Systems  Psychiatric/Behavioral: Positive for depression and suicidal ideas. The patient is nervous/anxious.   All other systems reviewed and are negative.   Blood pressure (!) 89/74, pulse (!) 106, temperature 98 F (36.7 C), temperature source Oral, resp. rate 18, SpO2 100 %.There is no height or weight on file to calculate BMI.  General Appearance: Casual  Eye Contact:  Fair  Speech:  Clear and Coherent and Normal Rate  Volume:  Normal  Mood:  Anxious, Depressed and Irritable  Affect:  Congruent and Depressed  Thought Process:  Coherent, Linear and Descriptions of Associations: Intact  Orientation:  Full (Time, Place, and Person)  Thought Content:  Logical, Ideas of  Reference:   Paranoia and Rumination  Suicidal Thoughts:  Yes.  without intent/plan  Homicidal Thoughts:  Yes.  without intent/plan  Memory:  Immediate;   Good Recent;   Good Remote;   Fair  Judgement:  Fair  Insight:  Present  Psychomotor Activity:  Normal  Concentration: Concentration: Fair and Attention Span: Fair  Recall:  Good  Fund of Knowledge:Good  Language: Good  Akathisia:  Negative  Handed:  Right  AIMS (if indicated):     Assets:  Agricultural consultant Housing  Sleep:       Musculoskeletal: Strength & Muscle Tone: within normal limits Gait & Station: normal Patient leans: N/A  Blood pressure (!) 89/74, pulse (!) 106, temperature 98 F (36.7 C), temperature source Oral, resp. rate 18, SpO2 100 %.  Recommendations:  Based on my evaluation the patient does not appear to have an emergency medical condition.  Admit Inpatient,  400 hall for stabilization and medication management  Ethelene Hal, NP 10/21/2018, 9:54 AM  Patient seen face-to-face for psychiatric evaluation, chart reviewed and case discussed with the physician extender and developed treatment plan. Reviewed the information documented and agree with the treatment plan. Corena Pilgrim, MD
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
## 2018-10-21 NOTE — Tx Team (Addendum)
Initial Treatment Plan 10/21/2018 3:26 PM Dale [name] FEO:[id number]    PATIENT STRESSORS: Educational concerns Financial difficulties Health problems Marital or family conflict   PATIENT STRENGTHS: Ability for insight   PATIENT IDENTIFIED PROBLEMS: MDD with SI  Neuropathy            " I'm tired of this"  " I feel like giving up"     DISCHARGE CRITERIA:  Ability to meet basic life and health needs Adequate post-discharge living arrangements Improved stabilization in mood, thinking, and/or behavior  PRELIMINARY DISCHARGE PLAN: Attend aftercare/continuing care group Attend PHP/IOP Attend 12-step recovery group  PATIENT/FAMILY INVOLVEMENT: This treatment plan has been presented to and reviewed with the patient, Dale [name], and/or family member, .  The patient and family have been given the opportunity to ask questions and make suggestions.  Lauralyn Primes, RN 10/21/2018, 3:26 PM

## 2018-10-22 DIAGNOSIS — F332 Major depressive disorder, recurrent severe without psychotic features: Principal | ICD-10-CM

## 2018-10-22 LAB — RAPID URINE DRUG SCREEN, HOSP PERFORMED
Amphetamines: NOT DETECTED
Barbiturates: NOT DETECTED
Benzodiazepines: NOT DETECTED
Cocaine: POSITIVE — AB
Opiates: NOT DETECTED
Tetrahydrocannabinol: NOT DETECTED

## 2018-10-22 LAB — GLUCOSE, CAPILLARY: Glucose-Capillary: 95 mg/dL (ref 70–99)

## 2018-10-22 MED ORDER — GLUCERNA SHAKE PO LIQD
237.0000 mL | Freq: Three times a day (TID) | ORAL | Status: DC
  Administered 2018-10-22 – 2018-10-30 (×22): 237 mL via ORAL

## 2018-10-22 MED ORDER — PRAZOSIN HCL 2 MG PO CAPS
2.0000 mg | ORAL_CAPSULE | Freq: Every day | ORAL | Status: DC
  Administered 2018-10-22 – 2018-10-24 (×3): 2 mg via ORAL
  Filled 2018-10-22 (×5): qty 1

## 2018-10-22 MED ORDER — METFORMIN HCL 500 MG PO TABS
500.0000 mg | ORAL_TABLET | Freq: Every day | ORAL | Status: DC
  Administered 2018-10-22 – 2018-10-30 (×9): 500 mg via ORAL
  Filled 2018-10-22 (×10): qty 1

## 2018-10-22 MED ORDER — OLANZAPINE 5 MG PO TABS
5.0000 mg | ORAL_TABLET | Freq: Every day | ORAL | Status: DC
  Administered 2018-10-22 – 2018-10-23 (×2): 5 mg via ORAL
  Filled 2018-10-22 (×3): qty 1

## 2018-10-22 MED ORDER — SERTRALINE HCL 50 MG PO TABS
50.0000 mg | ORAL_TABLET | Freq: Every day | ORAL | Status: DC
  Administered 2018-10-22 – 2018-10-29 (×7): 50 mg via ORAL
  Filled 2018-10-22 (×10): qty 1

## 2018-10-22 MED ORDER — CALCIUM CARBONATE 1250 (500 CA) MG PO TABS
1.0000 | ORAL_TABLET | Freq: Two times a day (BID) | ORAL | Status: DC | PRN
  Filled 2018-10-22: qty 1

## 2018-10-22 MED ORDER — OLANZAPINE 10 MG PO TABS
10.0000 mg | ORAL_TABLET | Freq: Every day | ORAL | Status: DC
  Administered 2018-10-22 – 2018-10-24 (×3): 10 mg via ORAL
  Filled 2018-10-22 (×6): qty 1

## 2018-10-22 MED ORDER — BUSPIRONE HCL 15 MG PO TABS
15.0000 mg | ORAL_TABLET | Freq: Three times a day (TID) | ORAL | Status: DC
  Administered 2018-10-22 – 2018-10-27 (×14): 15 mg via ORAL
  Filled 2018-10-22 (×23): qty 1
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
## 2018-10-22 NOTE — Progress Notes (Signed)
   10/22/18 0939  COVID-19 Daily Checkoff  Have you had a fever (temp > 37.80C/100F)  in the past 24 hours?  No  If you have had runny nose, nasal congestion, sneezing in the past 24 hours, has it worsened? No  COVID-19 EXPOSURE  Have you traveled outside the state in the past 14 days? No  Have you been in contact with someone with a confirmed diagnosis of COVID-19 or PUI in the past 14 days without wearing appropriate PPE? No  Have you been living in the same home as a person with confirmed diagnosis of COVID-19 or a PUI (household contact)? No  Have you been diagnosed with COVID-19? No

## 2018-10-22 NOTE — BHH Group Notes (Signed)
Life SKills   Date:  10/22/2018  Time:  1300  Type of Therapy:  Nurse Education  :  The group is focused on teaching patients how to identify unhealthy coping skills and how to develop and practice newer and healtheir ways to get their needs met.   Participation Level:  Did Not Attend  Participation Quality:    Affect:    Cognitive:    Insight:    Engagement in Group:    Modes of Intervention:    Summary of Progress/Problems:  Lauralyn Primes 10/22/2018, 3:45 PM

## 2018-10-22 NOTE — BHH Suicide Risk Assessment (Signed)
[facility] INPATIENT:  Family/Significant Other Suicide Prevention Education  Suicide Prevention Education:  Patient Refusal for Family/Significant Other Suicide Prevention Education: The patient Dale [name] has refused to provide written consent for family/significant other to be provided Family/Significant Other Suicide Prevention Education during admission and/or prior to discharge.  Physician notified.  Berlin Hun Grossman-Orr 10/22/2018, 3:00 PM

## 2018-10-22 NOTE — BHH Group Notes (Signed)
[place] [facility] LCSW Group Therapy Note  Date/Time:  10/22/2018    10:15-11:15AM  Type of Therapy and Topic:  Group Therapy:  Healthy and Unhealthy Supports  Participation Level:  Minimal  Description of Group:  Patients in this group were led to examine the current supports in their lives and determine whether those supports are healthy or unhealthy for them.   Setting boundaries was discussed and described in detail.  An emphasis was placed on using individual counselor, doctor, therapy groups, 12-step groups, and problem-specific support groups to expand supports.  Therapeutic Goals: 1) compare healthy versus unhealthy supports and identify some examples of each 2) discuss importance of setting boundaries or terminating relationships with unhealthy supports  3)  generate ideas and descriptions of healthy supports that can be added  4)  offer mutual support about how to address unhealthy supports  5)  encourage active participation in and adherence to discharge plan    Summary of Patient Progress:  The patient was quiet throughout group, fell to sleep at one point.  He said he has been off his medicines and has to get used to them again.   Therapeutic Modalities:   Brief Solution-Focused Therapy  [name], LCSW
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
## 2018-10-22 NOTE — [facility] Group Notes (Signed)
Pt did not attend wrap up group this evening. Pt was asleep in bed.  

## 2018-10-22 NOTE — [facility] Suicide Risk Assessment (Signed)
[facility] Admission Suicide Risk Assessment   Nursing information obtained from:  Patient Demographic factors:  Male Current Mental Status:  Suicidal ideation indicated by others Loss Factors:  Loss of significant relationship Historical Factors:  Anniversary of important loss Risk Reduction Factors:  Responsible for children under 52 years of age  Total Time spent with patient: 30 minutes Principal Problem: MDD (major depressive disorder), recurrent severe, without psychosis (HCC) Diagnosis:  Principal Problem:   MDD (major depressive disorder), recurrent severe, without psychosis (HCC)  Subjective Data: Patient is seen and examined.  Patient is a 53 year old male with a past psychiatric history significant for major depression with a previous history of psychotic features versus bipolar disorder, cocaine abuse, alcohol use disorder, posttraumatic stress disorder as well as past medical history significant for diabetes mellitus type 2, recurrent blood clots and Crohn's disease.  The patient stated that he had been doing well until recently.  He stopped taking his medicine several days ago.  He apparently had discontinued his medications, and then began not "caring about anything anymore".  He and his girlfriend had had a recent break-up.  During interview today he appears manic and pressured.  He denied specific suicidal ideation but stated he no longer wanted to live.  He has been diagnosed with major depression in the past with psychotic features.  He did admit to auditory hallucinations telling him to kill himself.  He has a previous history of substance abuse but unfortunately a urine drug screen was not obtained.  His blood alcohol was less than 10.  He was admitted to the hospital for evaluation and stabilization.  Continued Clinical Symptoms:  Alcohol Use Disorder Identification Test Final Score (AUDIT): 0 The "Alcohol Use Disorders Identification Test", Guidelines for Use in Primary Care,  Second Edition.  World Science writer [facility]). Score between 0-7:  no or low risk or alcohol related problems. Score between 8-15:  moderate risk of alcohol related problems. Score between 16-19:  high risk of alcohol related problems. Score 20 or above:  warrants further diagnostic evaluation for alcohol dependence and treatment.   CLINICAL FACTORS:   Severe Anxiety and/or Agitation Bipolar Disorder:   Depressive phase Depression:   Anhedonia Hopelessness Impulsivity Insomnia Alcohol/Substance Abuse/Dependencies More than one psychiatric diagnosis Currently Psychotic Previous Psychiatric Diagnoses and Treatments Medical Diagnoses and Treatments/Surgeries   Musculoskeletal: Strength & Muscle Tone: within normal limits Gait & Station: normal Patient leans: N/A  Psychiatric Specialty Exam: Physical Exam  Nursing note and vitals reviewed. Constitutional: He is oriented to person, place, and time. He appears well-developed and well-nourished.  HENT:  Head: Normocephalic and atraumatic.  Respiratory: Effort normal.  Neurological: He is alert and oriented to person, place, and time.    ROS  Blood pressure (!) 110/96, pulse 90, temperature 97.8 F (36.6 C), temperature source Oral, resp. rate 20, height 5\' 11"  (1.803 m), weight 77.1 kg, SpO2 99 %.Body mass index is 23.71 kg/m.  General Appearance: Disheveled  Eye Contact:  Fair  Speech:  Pressured  Volume:  Increased  Mood:  Anxious, Depressed, Dysphoric and Irritable  Affect:  Labile  Thought Process:  Coherent and Descriptions of Associations: Intact  Orientation:  Full (Time, Place, and Person)  Thought Content:  Hallucinations: Auditory  Suicidal Thoughts:  Yes.  without intent/plan  Homicidal Thoughts:  No  Memory:  Immediate;   Fair Recent;   Fair Remote;   Fair  Judgement:  Impaired  Insight:  Fair  Psychomotor Activity:  Increased  Concentration:  Concentration:  Fair and Attention Span: Fair  Recall:  [employer] of Knowledge:  Fair  Language:  Fair  Akathisia:  Negative  Handed:  Right  AIMS (if indicated):     Assets:  Desire for Improvement Resilience  ADL's:  Intact  Cognition:  WNL  Sleep:  Number of Hours: 5.5      COGNITIVE FEATURES THAT CONTRIBUTE TO RISK:  None    SUICIDE RISK:   Moderate:  Frequent suicidal ideation with limited intensity, and duration, some specificity in terms of plans, no associated intent, good self-control, limited dysphoria/symptomatology, some risk factors present, and identifiable protective factors, including available and accessible social support.  PLAN OF CARE: Patient is seen and examined.  Patient is a 53 year old male with the above-stated past medical and psychiatric history who presented to the hospital with suicidal ideation.  He will be admitted to the hospital.  He will be integrated into the milieu.  He will be encouraged to attend groups.  He will be restarted on his Zyprexa, BuSpar, gabapentin, hydroxyzine, prazosin and sertraline.  He also have his medications for his blood clots and diabetes continued.  We will order a urine drug screen this morning.  The rest of his GI medications will be continued as well.  Review of his laboratories revealed a normal white blood cell count and CBC.  His TSH was normal, his hemoglobin A1c was 5.8.  His creatinine was elevated at 1.49.  This had been between 1.21 and 1.25 between 2 weeks ago as well as several months.  It may be related to dehydration.  This will be monitored.  Hopefully we can stabilize some of the situations.  I certify that inpatient services furnished can reasonably be expected to improve the patient's condition.   Sharma Covert, MD 10/22/2018, 8:34 AM

## 2018-10-23 LAB — GLUCOSE, CAPILLARY: Glucose-Capillary: 96 mg/dL (ref 70–99)

## 2018-10-23 MED ORDER — LIDOCAINE 5 % EX OINT
TOPICAL_OINTMENT | Freq: Three times a day (TID) | CUTANEOUS | Status: DC
  Administered 2018-10-23 – 2018-10-26 (×7): via TOPICAL
  Administered 2018-10-27: 1 via TOPICAL
  Administered 2018-10-27 – 2018-10-29 (×6): via TOPICAL
  Filled 2018-10-23 (×2): qty 35.44

## 2018-10-23 MED ORDER — LIDOCAINE 4 % EX CREA
TOPICAL_CREAM | Freq: Three times a day (TID) | CUTANEOUS | Status: DC
  Filled 2018-10-23: qty 5

## 2018-10-23 MED ORDER — OLANZAPINE 2.5 MG PO TABS
2.5000 mg | ORAL_TABLET | Freq: Every day | ORAL | Status: DC
  Administered 2018-10-24 – 2018-10-25 (×2): 2.5 mg via ORAL
  Filled 2018-10-23 (×4): qty 1

## 2018-10-23 MED ORDER — GABAPENTIN 400 MG PO CAPS: 400.0000 mg | ORAL_CAPSULE | Freq: Three times a day (TID) | ORAL | Status: DC

## 2018-10-23 MED ORDER — GABAPENTIN 300 MG PO CAPS
600.0000 mg | ORAL_CAPSULE | Freq: Three times a day (TID) | ORAL | Status: DC
  Administered 2018-10-23 – 2018-10-25 (×7): 600 mg via ORAL
  Filled 2018-10-23 (×14): qty 2
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
## 2018-10-23 NOTE — [facility] Group Notes (Signed)
LCSW Group Therapy Note 10/23/2018 2:27 PM  Type of Therapy and Topic: Group Therapy: Overcoming Obstacles  Participation Level: Active  Description of Group:  In this group patients will be encouraged to explore what they see as obstacles to their own wellness and recovery. They will be guided to discuss their thoughts, feelings, and behaviors related to these obstacles. The group will process together ways to cope with barriers, with attention given to specific choices patients can make. Each patient will be challenged to identify changes they are motivated to make in order to overcome their obstacles. This group will be process-oriented, with patients participating in exploration of their own experiences as well as giving and receiving support and challenge from other group members.  Therapeutic Goals: 1. Patient will identify personal and current obstacles as they relate to admission. 2. Patient will identify barriers that currently interfere with their wellness or overcoming obstacles.  3. Patient will identify feelings, thought process and behaviors related to these barriers. 4. Patient will identify two changes they are willing to make to overcome these obstacles:   Summary of Patient Progress  Dale [name] was engaged and participated throughout the group session. Dale [name] reports that his main obstacle is his relationship with his girlfriend. He reports that his main barrier is "putting too much effort in everybody else and not me".    Therapeutic Modalities:  Cognitive Behavioral Therapy Solution Focused Therapy Motivational Interviewing Relapse Prevention Therapy   Theresa Duty Clinical Social Worker

## 2018-10-23 NOTE — Tx Team (Signed)
Interdisciplinary Treatment and Diagnostic Plan Update  10/23/2018 Time of Session:  Dale [name] MRN: [id number]  Principal Diagnosis: MDD (major depressive disorder), recurrent severe, without psychosis ([place])  Secondary Diagnoses: Principal Problem:   MDD (major depressive disorder), recurrent severe, without psychosis ([place])   Current Medications:  Current Facility-Administered Medications  Medication Dose Route Frequency Provider Last Rate Last Dose  . acetaminophen (TYLENOL) tablet 650 mg  650 mg Oral Q6H PRN Sharma Covert, MD      . alum & mag hydroxide-simeth (MAALOX/MYLANTA) 200-200-20 MG/5ML suspension 30 mL  30 mL Oral Q4H PRN Sharma Covert, MD      . apixaban Arne Cleveland) tablet 2.5 mg  2.5 mg Oral BID Dixon, Rashaun M, NP   2.5 mg at 10/23/18 0800  . busPIRone (BUSPAR) tablet 15 mg  15 mg Oral TID Sharma Covert, MD   15 mg at 10/23/18 0800  . calcium carbonate (OS-CAL - dosed in mg of elemental calcium) tablet 500 mg of elemental calcium  1 tablet Oral BID PRN Sharma Covert, MD      . feeding supplement (GLUCERNA SHAKE) (GLUCERNA SHAKE) liquid 237 mL  237 mL Oral TID WC Sharma Covert, MD   237 mL at 10/22/18 1655  . gabapentin (NEURONTIN) capsule 800 mg  800 mg Oral TID Anette Riedel M, NP   800 mg at 10/23/18 0800  . hydrOXYzine (ATARAX/VISTARIL) tablet 25 mg  25 mg Oral TID PRN Sharma Covert, MD   25 mg at 10/21/18 2117  . magnesium hydroxide (MILK OF MAGNESIA) suspension 30 mL  30 mL Oral Daily PRN Sharma Covert, MD      . metFORMIN (GLUCOPHAGE) tablet 500 mg  500 mg Oral Q breakfast Sharma Covert, MD   500 mg at 10/23/18 0800  . OLANZapine (ZYPREXA) tablet 10 mg  10 mg Oral QHS Sharma Covert, MD   10 mg at 10/22/18 2115  . OLANZapine (ZYPREXA) tablet 5 mg  5 mg Oral Daily Sharma Covert, MD   5 mg at 10/23/18 0800  . prazosin (MINIPRESS) capsule 2 mg  2 mg Oral QHS Sharma Covert, MD   2 mg at 10/22/18 2115  . sertraline  (ZOLOFT) tablet 50 mg  50 mg Oral Daily Sharma Covert, MD   50 mg at 10/22/18 0939  . traZODone (DESYREL) tablet 50 mg  50 mg Oral QHS PRN Sharma Covert, MD       PTA Medications: Medications Prior to Admission  Medication Sig Dispense Refill Last Dose  . apixaban (ELIQUIS) 2.5 MG TABS tablet Take 1 tablet (2.5 mg total) by mouth 2 (two) times daily. For blood clot prevention 10 tablet 0 Unknown at Unknown time  . dicyclomine (BENTYL) 20 MG tablet Take 20 mg by mouth 4 (four) times daily as needed for cramping.   Unknown at Unknown time  . gabapentin (NEURONTIN) 400 MG capsule Take 2 capsules (800 mg total) by mouth 3 (three) times daily. For agitation/diabetic neuropathy 180 capsule 0 Unknown at Unknown time  . OLANZapine (ZYPREXA) 5 MG tablet Take 1 tablet (5 mg) by mouth in the morning & 2 Tablets (10 mg) at bedtime: For mood control (Patient not taking: Reported on 10/02/2018) 90 tablet 0 Unknown at Unknown time  . sertraline (ZOLOFT) 50 MG tablet Take 1 tablet (50 mg total) by mouth daily. 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   Physician Treatment Plan for Primary Diagnosis: MDD (major depressive disorder), recurrent severe, without psychosis ([place]) Long Term Goal(s): Improvement in symptoms so as ready for discharge Improvement in symptoms so as ready for discharge   Short Term Goals: Ability to identify changes in lifestyle to reduce recurrence of condition will improve Ability to verbalize feelings will improve Ability to disclose and discuss suicidal ideas Ability to demonstrate self-control will improve Ability to  identify and develop effective coping behaviors will improve Ability to maintain clinical measurements within normal limits will improve Compliance with prescribed medications will improve Ability to identify triggers associated with substance abuse/mental health issues will improve Ability to identify changes in lifestyle to reduce recurrence of condition will improve Ability to verbalize feelings will improve Ability to disclose and discuss suicidal ideas Ability to demonstrate self-control will improve Ability to identify and develop effective coping behaviors will improve Ability to maintain clinical measurements within normal limits will improve Compliance with prescribed medications will improve Ability to identify triggers associated with substance abuse/mental health issues will improve  Medication Management: Evaluate patient's response, side effects, and tolerance of medication regimen.  Therapeutic Interventions: 1 to 1 sessions, Unit Group sessions and Medication administration.  Evaluation of Outcomes: Not Met  Physician Treatment Plan for Secondary Diagnosis: Principal Problem:   MDD (major depressive disorder), recurrent severe, without psychosis ([employer])  Long Term Goal(s): Improvement in symptoms so as ready for discharge Improvement in symptoms so as ready for discharge   Short Term Goals: Ability to identify changes in lifestyle to reduce recurrence of condition will improve Ability to verbalize feelings will improve Ability to disclose and discuss suicidal ideas Ability to demonstrate self-control will improve Ability to identify and develop effective coping behaviors will improve Ability to maintain clinical measurements within normal limits will improve Compliance with prescribed medications will improve Ability to identify triggers associated with substance abuse/mental health issues will improve Ability to identify changes in lifestyle to reduce recurrence of  condition will improve Ability to verbalize feelings will improve Ability to disclose and discuss suicidal ideas Ability to demonstrate self-control will improve Ability to identify and develop effective coping behaviors will improve Ability to maintain clinical measurements within normal limits will improve Compliance with prescribed medications will improve Ability to identify triggers associated with substance abuse/mental health issues will improve     Medication Management: Evaluate patient's response, side effects, and tolerance of medication regimen.  Therapeutic Interventions: 1 to 1 sessions, Unit Group sessions and Medication administration.  Evaluation of Outcomes: Not Met   RN Treatment Plan for Primary Diagnosis: MDD (major depressive disorder), recurrent severe, without psychosis ([place]) Long Term Goal(s): Knowledge of disease and therapeutic regimen to maintain health will improve  Short Term Goals: Ability to participate in decision making will improve, Ability to verbalize feelings will improve, Ability to disclose and discuss suicidal ideas, Ability to identify and develop effective coping behaviors will improve and Compliance with prescribed medications will improve  Medication Management: RN will administer medications as ordered by provider, will assess and evaluate patient's response and provide education to patient for prescribed medication. RN will report any adverse and/or side effects to prescribing provider.  Therapeutic Interventions: 1 on 1 counseling sessions, Psychoeducation, Medication administration, Evaluate responses to treatment, Monitor vital signs and CBGs  as ordered, Perform/monitor CIWA, COWS, AIMS and Fall Risk screenings as ordered, Perform wound care treatments as ordered.  Evaluation of Outcomes: Not Met   LCSW Treatment Plan for Primary Diagnosis: MDD (major depressive disorder), recurrent severe, without psychosis ([place]) Long Term Goal(s): Safe  transition to appropriate next level of care at discharge, Engage patient in therapeutic group addressing interpersonal concerns.  Short Term Goals: Engage patient in aftercare planning with referrals and resources  Therapeutic Interventions: Assess for all discharge needs, 1 to 1 time with Social worker, Explore available resources and support systems, Assess for adequacy in community support network, Educate family and significant other(s) on suicide prevention, Complete Psychosocial Assessment, Interpersonal group therapy.  Evaluation of Outcomes: Not Met   Progress in Treatment: Attending groups: Yes. Participating in groups: Yes. Minimally  Taking medication as prescribed: Yes. Toleration medication: Yes. Family/Significant other contact made: No, will contact:  patient declined consent for collateral contacts Patient understands diagnosis: Yes. Discussing patient identified problems/goals with staff: Yes. Medical problems stabilized or resolved: Yes. Denies suicidal/homicidal ideation: Yes. Issues/concerns per patient self-inventory: No. Other:   New problem(s) identified: None   New Short Term/Long Term Goal(s):Detox, medication stabilization, elimination of SI thoughts, development of comprehensive mental wellness plan.    Patient Goals:    Discharge Plan or Barriers: Patient recently admitted. CSW will continue to follow and assess for appropriate referrals and possible discharge planning.    Reason for Continuation of Hospitalization: Depression Medication stabilization Suicidal ideation  Estimated Length of Stay: 3-5 days   Attendees: Patient: 10/23/2018 8:55 AM  Physician: Dr. Myles Lipps, MD 10/23/2018 8:55 AM  Nursing: Legrand Como.Chauncey Cruel, RN 10/23/2018 8:55 AM  RN Care Manager: 10/23/2018 8:55 AM  Social Worker: Radonna Ricker, [place] 10/23/2018 8:55 AM  Recreational Therapist:  10/23/2018 8:55 AM  Other:  10/23/2018 8:55 AM  Other:  10/23/2018 8:55 AM  Other: 10/23/2018 8:55  AM    Scribe for Treatment Team: Marylee Floras, [place] 10/23/2018 8:55 AM

## 2018-10-23 NOTE — Plan of Care (Signed)
Progress note   D: pt found in bed; compliant with medication administration. Pt is visibly anxious, preoccupied, and complaints of nausea. Pt states he wants to hold off on some of his meds, as he feels he was started back too much, too fast. Pt is fidgety and guarded in his assessment. Pt denies si/hi/ah/vh and verbally agrees to approach staff if these become apparent or before harming himself/others while at [facility]: Pt provided support and encouragement. Pt given medication per protocol and standing orders. Q35m safety checks implemented and continued.  R: Pt safe on the unit. Will continue to monitor.  Pt progressing in the following metrics  Problem: Education: Goal: Ability to make informed decisions regarding treatment will improve Outcome: Progressing   Problem: Coping: Goal: Coping ability will improve Outcome: Progressing   Problem: Health Behavior/Discharge Planning: Goal: Identification of resources available to assist in meeting health care needs will improve Outcome: Progressing   Problem: Medication: Goal: Compliance with prescribed medication regimen will improve Outcome: Progressing

## 2018-10-23 NOTE — [facility] Group Notes (Signed)
Adult Psychoeducational Group Note  Date:  10/23/2018 Time:  9:35 PM  Group Topic/Focus:  Wrap-Up Group:   The focus of this group is to help patients review their daily goal of treatment and discuss progress on daily workbooks.  Participation Level:  Active  Participation Quality:  Appropriate and Attentive  Affect:  Appropriate  Cognitive:  Alert and Appropriate  Insight: Appropriate and Good  Engagement in Group:  Engaged  Modes of Intervention:  Discussion and Education  Additional Comments:  Pt attended and participated in wrap up group this evening and rated their day a 7/10. Pt did not complete their goal, which was to stay positive.   Cristi Loron 10/23/2018, 9:35 PM

## 2018-10-24 LAB — GLUCOSE, CAPILLARY: Glucose-Capillary: 90 mg/dL (ref 70–99)

## 2018-10-24 MED ORDER — NAPROXEN 500 MG PO TABS
500.0000 mg | ORAL_TABLET | Freq: Once | ORAL | Status: AC
  Administered 2018-10-24: 500 mg via ORAL
  Filled 2018-10-24 (×2): qty 1
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
## 2018-10-24 NOTE — Progress Notes (Signed)
Patient ID: Dale [name], male   DOB: 04-24-1965, 53 y.o.   MRN: [id number] D: Pt with blunted affect, mood depressed, and denies SI/HI/AVH.  Pt irritable, stated that he is not happy that his Gabapentin dose was reduce from 800mg  TID to 600mg  TID.  Education was given to him that as per Dr Mallie Darting, this was due to the fact that he was more sedated on the higher dosage.  Pt given self inventory sheet to complete, but he did not complete it, and prefers to be seclusive in his room most of the time, requiring multiple positive verbal reinforcements to get out of the room and be in the common areas.  Pt complains of right leg pain, but is non Gabapentin for this reason.    A: Pt is being maintained on Q15 minute checks for safety, will continue to monitor.  R:Will continue to monitor on Q15 minute checks

## 2018-10-24 NOTE — Progress Notes (Signed)
D: Pt denies SI/HI/AVH. Pt is pleasant and cooperative.. pt stated he was having " Sciatic pain" down R- leg and lower back. Pt was given 1 x Naproxen 500 mg per [facility] since pt stated he takes "aleve at home".   A: Pt was offered support and encouragement. Pt was given scheduled medications. Pt was encourage to attend groups. Q 15 minute checks were done for safety.  R: Pt is taking medication. Pt has no complaints.Pt receptive to treatment and safety maintained on unit.  Problem: Coping: Goal: Coping ability will improve Outcome: Progressing   Problem: Medication: Goal: Compliance with prescribed medication regimen will improve Outcome: Progressing

## 2018-10-24 NOTE — Progress Notes (Signed)
Adult Psychoeducational Group Note  Date:  10/24/2018 Time:  9:39 PM  Group Topic/Focus:  Wrap-Up Group:   The focus of this group is to help patients review their daily goal of treatment and discuss progress on daily workbooks.  Participation Level:  Active  Participation Quality:  Appropriate  Affect:  Appropriate  Cognitive:  Alert  Insight: Appropriate  Engagement in Group:  Engaged  Modes of Intervention:  Discussion  Additional Comments:  Pt stated that her goal for today was to stay positive and to think positive. Pt was able to "somewhat" able to achieve his goal. One new coping skill that the pt learned is self-control.   Sharmon Revere 10/24/2018, 9:39 PM

## 2018-10-24 NOTE — Plan of Care (Signed)
D: Patient is alert and cooperative. Endorses passive SI and AH. Denies HI, VH, and verbally contracts for safety.    A: Medications administered per MD order. Support provided. Patient educated on safety on the unit and medications. Routine safety checks every 15 minutes. Patient stated understanding to tell nurse about any new physical symptoms. Patient understands to tell staff of any needs.     R: No adverse drug reactions noted. Patient verbally contracts for safety. Patient remains safe at this time and will continue to monitor.   Problem: Safety: Goal: Ability to remain free from injury will improve Outcome: Progressing   [place] NOVEL CORONAVIRUS (COVID-19) DAILY CHECK-OFF SYMPTOMS - answer yes or no to each - every day NO YES  Have you had a fever in the past 24 hours?  Fever (Temp > 37.80C / 100F) X   Have you had any of these symptoms in the past 24 hours? New Cough  Sore Throat   Shortness of Breath  Difficulty Breathing  Unexplained Body Aches   X   Have you had any one of these symptoms in the past 24 hours not related to allergies?   Runny Nose  Nasal Congestion  Sneezing   X   If you have had runny nose, nasal congestion, sneezing in the past 24 hours, has it worsened?  X   EXPOSURES - check yes or no X   Have you traveled outside the state in the past 14 days?  X   Have you been in contact with someone with a confirmed diagnosis of COVID-19 or PUI in the past 14 days without wearing appropriate PPE?  X   Have you been living in the same home as a person with confirmed diagnosis of COVID-19 or a PUI (household contact)?    X   Have you been diagnosed with COVID-19?    X              What to do next: Answered NO to all: Answered YES to anything:   Proceed with unit schedule Follow the [facility] Inpatient Flowsheet.

## 2018-10-24 NOTE — Progress Notes (Signed)
Pt up to the nursing station stating he was having nerve pain . Pt expressed to Probation officer that he takes Aleve at home. Pt was ordered a 1 x Naproxen per [facility] and pt was encouraged to talk with the doctor tomorrow.

## 2018-10-25 LAB — BASIC METABOLIC PANEL
Anion gap: 9 (ref 5–15)
BUN: 14 mg/dL (ref 6–20)
CO2: 24 mmol/L (ref 22–32)
Calcium: 8.8 mg/dL — ABNORMAL LOW (ref 8.9–10.3)
Chloride: 108 mmol/L (ref 98–111)
Creatinine, Ser: 1.29 mg/dL — ABNORMAL HIGH (ref 0.61–1.24)
GFR calc Af Amer: >60 mL/min (ref >60)
GFR calc non Af Amer: >60 mL/min (ref >60)
Glucose, Bld: 111 mg/dL — ABNORMAL HIGH (ref 70–99)
Potassium: 3.8 mmol/L (ref 3.5–5.1)
Sodium: 141 mmol/L (ref 135–145)

## 2018-10-25 LAB — GLUCOSE, CAPILLARY: Glucose-Capillary: 100 mg/dL — ABNORMAL HIGH (ref 70–99)

## 2018-10-25 MED ORDER — PANTOPRAZOLE SODIUM 20 MG PO TBEC
20.0000 mg | DELAYED_RELEASE_TABLET | Freq: Every day | ORAL | Status: DC
  Administered 2018-10-25 – 2018-10-30 (×6): 20 mg via ORAL
  Filled 2018-10-25 (×9): qty 1

## 2018-10-25 MED ORDER — CALCIUM CARBONATE ANTACID 500 MG PO CHEW
1.0000 | CHEWABLE_TABLET | Freq: Four times a day (QID) | ORAL | Status: DC | PRN
  Administered 2018-10-25 – 2018-10-28 (×2): 200 mg via ORAL
  Filled 2018-10-25: qty 1

## 2018-10-25 MED ORDER — OLANZAPINE 5 MG PO TABS
12.5000 mg | ORAL_TABLET | Freq: Every day | ORAL | Status: DC
  Administered 2018-10-25 – 2018-10-29 (×5): 12.5 mg via ORAL
  Filled 2018-10-25 (×6): qty 2.5

## 2018-10-25 MED ORDER — GABAPENTIN 400 MG PO CAPS
800.0000 mg | ORAL_CAPSULE | Freq: Three times a day (TID) | ORAL | Status: DC
  Administered 2018-10-25 – 2018-10-30 (×15): 800 mg via ORAL
  Filled 2018-10-25 (×17): qty 2

## 2018-10-25 MED ORDER — DICYCLOMINE HCL 20 MG PO TABS
20.0000 mg | ORAL_TABLET | Freq: Three times a day (TID) | ORAL | Status: DC | PRN
  Administered 2018-10-25 – 2018-10-30 (×8): 20 mg via ORAL
  Filled 2018-10-25 (×8): qty 1

## 2018-10-25 MED ORDER — NAPROXEN 250 MG PO TABS
250.0000 mg | ORAL_TABLET | Freq: Two times a day (BID) | ORAL | Status: DC | PRN
  Administered 2018-10-25 – 2018-10-27 (×4): 250 mg via ORAL
  Filled 2018-10-25 (×4): qty 1

## 2018-10-25 MED ORDER — PRAZOSIN HCL 1 MG PO CAPS
3.0000 mg | ORAL_CAPSULE | Freq: Every day | ORAL | Status: DC
  Administered 2018-10-25 – 2018-10-29 (×5): 3 mg via ORAL
  Filled 2018-10-25 (×6): qty 3

## 2018-10-25 NOTE — Plan of Care (Signed)
Nurse discussed anxiety, depression and coping skills with patient.  

## 2018-10-25 NOTE — BHH Group Notes (Signed)
Occupational Therapy Group Note  Date:  10/25/2018 Time:  7:41 PM  Group Topic/Focus:  Self Esteem Action Plan:   The focus of this group is to help patients create a plan to continue to build self-esteem after discharge.  Participation Level:  Active  Participation Quality:  Appropriate  Affect:  Flat  Cognitive:  Alert  Insight: Improving  Engagement in Group:  Engaged  Modes of Intervention:  Activity, Discussion, Education and Socialization  Additional Comments:    S: I grew up in a [place] family which was a different way of thinking in regards to mental health and self esteem  O: OT tx with focus on self esteem building this date. Education given on definition of self esteem, with both causes of low and high self esteem identified. Activity given for pt to identify a positive/aspiring trait for each letter of the alphabet. Pt to work with peers to help complete activity and build positive thinking.   A: Pt presents with flat affect, engaged and participatory throughout session. Pt shares how parts of his past has decreased his self esteem in reference to growing up in a [place] family and being expected to mature from a young age. He shares how positive support increases his self esteem.  P: OT group will be x1 per week while pt inpatient.  Zenovia Jarred, MSOT, OTR/L Behavioral Health OT/ Acute Relief OT PHP Office: [place] 10/25/2018, 7:41 PM
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
## 2018-10-25 NOTE — Progress Notes (Addendum)
Pt observe watching TV in the dayroom. He appears flat in affect and mood. He denies SI/HI/AVH at this time. Pt was minimal with interaction. Appears to be somatically focused at times. Pt requesting to speak with a provider about his swollen right ankle. Pt encourage to elevate. Bedtime meds given as ordered. Support offered and safety maintained.

## 2018-10-25 NOTE — Progress Notes (Signed)
D:  Patient's self inventory sheet, patient has fair sleep, no sleep medication.  Fair appetite, low energy level, poor concentration.  Rated depression and hopeless 8, anxiety 7.  Denied withdrawals.   SI, contracts for safety.  Physical problems, lightheaded, pain, R leg and foot, worst pain #10.  Goal is staying focused.  Stay positive.  No discharge plans. A:  Medications administered per MD orders.  Emotional support and encouragement given patient. R:  Patient denied HI.  Denied A/V hallucinations. SI, no plan, contracts for safety.  Safety maintained with 15 minute checks.

## 2018-10-25 NOTE — Progress Notes (Signed)
Dale [name] NOVEL CORONAVIRUS (COVID-19) DAILY CHECK-OFF SYMPTOMS - answer yes or no to each - every day NO YES  Have you had a fever in the past 24 hours?  . Fever (Temp > 37.80C / 100F) X   Have you had any of these symptoms in the past 24 hours? . New Cough .  Sore Throat  .  Shortness of Breath .  Difficulty Breathing .  Unexplained Body Aches   X   Have you had any one of these symptoms in the past 24 hours not related to allergies?   . Runny Nose .  Nasal Congestion .  Sneezing   X   If you have had runny nose, nasal congestion, sneezing in the past 24 hours, has it worsened?  X   EXPOSURES - check yes or no X   Have you traveled outside the state in the past 14 days?  X   Have you been in contact with someone with a confirmed diagnosis of COVID-19 or PUI in the past 14 days without wearing appropriate PPE?  X   Have you been living in the same home as a person with confirmed diagnosis of COVID-19 or a PUI (household contact)?    X   Have you been diagnosed with COVID-19?    X              What to do next: Answered NO to all: Answered YES to anything:   Proceed with unit schedule Follow the [facility] Inpatient Flowsheet.   

## 2018-10-26 LAB — GLUCOSE, CAPILLARY: Glucose-Capillary: 76 mg/dL (ref 70–99)

## 2018-10-26 MED ORDER — PHENYLEPH-SHARK LIV OIL-MO-PET 0.25-3-14-71.9 % RE OINT
TOPICAL_OINTMENT | Freq: Two times a day (BID) | RECTAL | Status: DC | PRN
  Administered 2018-10-26: 17:00:00 via RECTAL
  Filled 2018-10-26: qty 28.4

## 2018-10-26 NOTE — Progress Notes (Signed)
Pt observe watching TV and interacting with peers in the dayroom. He denies SI/HI/AVH at this time. He remains preoccupied with pain in his right leg. Pt was encourage to elevate according to PT instructions. Pt took HS meds as ordered. Pt states he can't take trazodone due to "feeling groggy" in the a.m. Pt states he is not ready to go home. Support offered and safety maintained.

## 2018-10-26 NOTE — Progress Notes (Signed)
[place] NOVEL CORONAVIRUS (COVID-19) DAILY CHECK-OFF SYMPTOMS - answer yes or no to each - every day NO YES  Have you had a fever in the past 24 hours?  . Fever (Temp > 37.80C / 100F) X   Have you had any of these symptoms in the past 24 hours? . New Cough .  Sore Throat  .  Shortness of Breath .  Difficulty Breathing .  Unexplained Body Aches   X   Have you had any one of these symptoms in the past 24 hours not related to allergies?   . Runny Nose .  Nasal Congestion .  Sneezing   X   If you have had runny nose, nasal congestion, sneezing in the past 24 hours, has it worsened?  X   EXPOSURES - check yes or no X   Have you traveled outside the state in the past 14 days?  X   Have you been in contact with someone with a confirmed diagnosis of COVID-19 or PUI in the past 14 days without wearing appropriate PPE?  X   Have you been living in the same home as a person with confirmed diagnosis of COVID-19 or a PUI (household contact)?    X   Have you been diagnosed with COVID-19?    X              What to do next: Answered NO to all: Answered YES to anything:   Proceed with unit schedule Follow the [facility] Inpatient Flowsheet.   

## 2018-10-26 NOTE — Evaluation (Signed)
Physical Therapy Evaluation Patient Details Name: Dale [name] MRN: [id number] DOB: 05-26-1965 Today's Date: 10/26/2018   History of Present Illness  Pt presented to ED with c/p in R abdominal area, R buttocks and down back of R leg. PT wiht h/o of depression, anxiety and bipolr and back sx ( unspecified by patient and chart)  Clinical Impression  Saw pt and assessed from PT standpoint today. . Pt very pleasant and seems to be having a neurological pain pattern from a Low back dysfuction. Really recommend him follow up with a neurologigist if possible. Positive SLR test and DF test for radicular pain down R buttocks to right foot.   Pt would benefit from walking and using a straight cane , provided by PT on the unit.   Also , pt really would benefit from 2 more pillows in his room so when in sideling these pillows can be placed between his legs to take the pressure off his back and nerve pattern down his posterior R LE.  Will continue to follow while here at [facility] but will not need f/u PT upon DC.     Follow Up Recommendations No PT follow up    Equipment Recommendations  (donated cane was given to patient to use)    Recommendations for Other Services       Precautions / Restrictions        Mobility  Bed Mobility Overal bed mobility: Independent                Transfers Overall transfer level: Independent                  Ambulation/Gait Ambulation/Gait assistance: Independent Gait Distance (Feet): 100 Feet Assistive device: Straight cane       General Gait Details: walks fairly well without a cane, however having great pain and limitiation due to RLE radiculopathy. placing cane in R UE to limit weight bearing on R LE helped pt a lot.  Stairs            Wheelchair Mobility    Modified Rankin (Stroke Patients Only)       Balance                                             Pertinent Vitals/Pain Pain Assessment: 0-10 Pain  Score: 5  Pain Location: in R buttocks , down back of leg all the way to the bottom of his foot. only feels best in sidelying in bed with pillows between his legs. Pain Descriptors / Indicators: Aching;Burning Pain Intervention(s): Limited activity within patient's tolerance;Monitored during session    [place] expects to be discharged to:: Other (Comment)(per chart unsure , maybe to live with mother in [place])                      Prior Function Level of Independence: Independent               Hand Dominance        Extremity/Trunk Assessment        Lower Extremity Assessment Lower Extremity Assessment: RLE deficits/detail RLE Deficits / Details: strength WNL, however postive for radiating pain down R buttocks and T posterior leg with SLR test and knee extension test with DF in sitting. Didn't even have to do DF with knee extension to exacerbate the radicular pain. Noted  R foot surgery with some limitation on R ankle and scar/ calluous on bottom middle portion of his foot.       Communication      Cognition Arousal/Alertness: Awake/alert Behavior During Therapy: WFL for tasks assessed/performed Overall Cognitive Status: Within Functional Limits for tasks assessed                                        General Comments      Exercises     Assessment/Plan    PT Assessment Patient needs continued PT services  PT Problem List Decreased mobility;Decreased activity tolerance       PT Treatment Interventions DME instruction;Gait training;Functional mobility training;Therapeutic activities;Therapeutic exercise    PT Goals (Current goals can be found in the Care Plan section)  Acute Rehab PT Goals Patient Stated Goal: I want this pain down the back of my right leg to be better and fixed PT Goal Formulation: With patient Time For Goal Achievement: 11/09/18 Potential to Achieve Goals: Good    Frequency Min 2X/week   Barriers  to discharge        Co-evaluation               AM-PAC PT "6 Clicks" Mobility  Outcome Measure Help needed turning from your back to your side while in a flat bed without using bedrails?: None Help needed moving from lying on your back to sitting on the side of a flat bed without using bedrails?: None Help needed moving to and from a bed to a chair (including a wheelchair)?: None Help needed standing up from a chair using your arms (e.g., wheelchair or bedside chair)?: None Help needed to walk in hospital room?: None Help needed climbing 3-5 steps with a railing? : None 6 Click Score: 24    End of Session   Activity Tolerance: Patient tolerated treatment well Patient left: Other (comment)(in group room) Nurse Communication: Mobility status      Time: 1145-1200 PT Time Calculation (min) (ACUTE ONLY): 15 min   Charges:   PT Evaluation $PT Eval Low Complexity: 1 Low          [name], PT Acute Rehabilitation Services Pager: [phone] Office: [phone] 10/26/2018   Marella BileBRITT, [name] 10/26/2018, 5:43 PM

## 2018-10-26 NOTE — Plan of Care (Signed)
Nurse discussed anxiety, depression and coping skills with patient.  

## 2018-10-26 NOTE — Progress Notes (Signed)
D:  Patient's self inventory sheet, patient has fair sleep, no sleep medication.  Good appetite, normal energy level, good concentration.  Rated depression, hopeless and anxiety #6.  Denied withdrawals  Denied SI.  Physical problems, lightheaded, pain.  R foot, leg.  Pain medication helpful 50-50.  Goal is myself.  Plans to open up and talk.  No discharge plans. A:  Medications administered per MD orders.  Emotional support and encouragement given patient. R:  Safety maintained with 15 minute checks.  Patient denied SI and HI, contracts for safety.  Denied A/V hallucinations.
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
## 2018-10-26 NOTE — Progress Notes (Signed)
Adult Psychoeducational Group Note  Date:  10/26/2018 Time:  4:42 PM  Group Topic/Focus:  Goals Group:   The focus of this group is to help patients establish daily goals to achieve during treatment and discuss how the patient can incorporate goal setting into their daily lives to aide in recovery.  Participation Level:  Active  Participation Quality:  Appropriate  Affect:  Appropriate  Cognitive:  Appropriate  Insight: Appropriate  Engagement in Group:  Engaged  Modes of Intervention:  Discussion  Additional Comments:  Pt attended group and participated in discussion.  Flynn Lininger R Vinaya Sancho 10/26/2018, 4:42 PM

## 2018-10-26 NOTE — Progress Notes (Signed)
Patient shared with CSW that he plans to discharge home with his mother in [place], [place]. He reports he would like to follow up with [facility] in [facility] for outpatient medication management and therapy services.   CSW will continue to follow for a safe discharge.     Radonna Ricker, MSW, [place] Worker [facility]  Phone: [phone]

## 2018-10-27 LAB — GLUCOSE, CAPILLARY: Glucose-Capillary: 121 mg/dL — ABNORMAL HIGH (ref 70–99)

## 2018-10-27 MED ORDER — TRAMADOL HCL 50 MG PO TABS
50.0000 mg | ORAL_TABLET | Freq: Two times a day (BID) | ORAL | Status: DC | PRN
  Administered 2018-10-27 – 2018-10-30 (×6): 50 mg via ORAL
  Filled 2018-10-27 (×6): qty 1
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
## 2018-10-27 NOTE — Progress Notes (Addendum)
Pt attended wrap-up group. He appears flat/anxious on approach. He denies SI/HI/AVH at this time. Pt remains preoccupied with his pain; however; states tramadol has been effective in management. Heat packs offered and given. Support offered and safety maintained.

## 2018-10-27 NOTE — Progress Notes (Signed)
[place] NOVEL CORONAVIRUS (COVID-19) DAILY CHECK-OFF SYMPTOMS - answer yes or no to each - every day NO YES  Have you had a fever in the past 24 hours?  . Fever (Temp > 37.80C / 100F) X   Have you had any of these symptoms in the past 24 hours? . New Cough .  Sore Throat  .  Shortness of Breath .  Difficulty Breathing .  Unexplained Body Aches   X   Have you had any one of these symptoms in the past 24 hours not related to allergies?   . Runny Nose .  Nasal Congestion .  Sneezing   X   If you have had runny nose, nasal congestion, sneezing in the past 24 hours, has it worsened?  X   EXPOSURES - check yes or no X   Have you traveled outside the state in the past 14 days?  X   Have you been in contact with someone with a confirmed diagnosis of COVID-19 or PUI in the past 14 days without wearing appropriate PPE?  X   Have you been living in the same home as a person with confirmed diagnosis of COVID-19 or a PUI (household contact)?    X   Have you been diagnosed with COVID-19?    X              What to do next: Answered NO to all: Answered YES to anything:   Proceed with unit schedule Follow the [facility] Inpatient Flowsheet.   

## 2018-10-27 NOTE — Tx Team (Signed)
Interdisciplinary Treatment and Diagnostic Plan Update  10/27/2018 Time of Session:  Dale [name] MRN: [id number]  Principal Diagnosis: MDD (major depressive disorder), recurrent severe, without psychosis ([place])  Secondary Diagnoses: Principal Problem:   MDD (major depressive disorder), recurrent severe, without psychosis ([place])   Current Medications:  Current Facility-Administered Medications  Medication Dose Route Frequency Provider Last Rate Last Dose  . acetaminophen (TYLENOL) tablet 650 mg  650 mg Oral Q6H PRN Sharma Covert, MD      . alum & mag hydroxide-simeth (MAALOX/MYLANTA) 200-200-20 MG/5ML suspension 30 mL  30 mL Oral Q4H PRN Sharma Covert, MD      . apixaban Arne Cleveland) tablet 2.5 mg  2.5 mg Oral BID Anette Riedel M, NP   2.5 mg at 10/27/18 0803  . busPIRone (BUSPAR) tablet 15 mg  15 mg Oral TID Sharma Covert, MD   15 mg at 10/27/18 0803  . calcium carbonate (OS-CAL - dosed in mg of elemental calcium) tablet 500 mg of elemental calcium  1 tablet Oral BID PRN Sharma Covert, MD      . calcium carbonate (TUMS - dosed in mg elemental calcium) chewable tablet 200 mg of elemental calcium  1 tablet Oral QID PRN Sharma Covert, MD   200 mg of elemental calcium at 10/25/18 1230  . dicyclomine (BENTYL) tablet 20 mg  20 mg Oral TID PRN Connye Burkitt, NP   20 mg at 10/26/18 2132  . feeding supplement (GLUCERNA SHAKE) (GLUCERNA SHAKE) liquid 237 mL  237 mL Oral TID WC Sharma Covert, MD   237 mL at 10/27/18 0626  . gabapentin (NEURONTIN) capsule 800 mg  800 mg Oral TID Connye Burkitt, NP   800 mg at 10/27/18 0803  . hydrOXYzine (ATARAX/VISTARIL) tablet 25 mg  25 mg Oral TID PRN Sharma Covert, MD   25 mg at 10/26/18 2132  . lidocaine (XYLOCAINE) 5 % ointment   Topical TID Sharma Covert, MD      . magnesium hydroxide (MILK OF MAGNESIA) suspension 30 mL  30 mL Oral Daily PRN Sharma Covert, MD      . metFORMIN (GLUCOPHAGE) tablet 500 mg  500 mg Oral Q  breakfast Sharma Covert, MD   500 mg at 10/27/18 8850  . naproxen (NAPROSYN) tablet 250 mg  250 mg Oral BID PRN Connye Burkitt, NP   250 mg at 10/27/18 2774  . OLANZapine (ZYPREXA) tablet 12.5 mg  12.5 mg Oral QHS Connye Burkitt, NP   12.5 mg at 10/26/18 2131  . pantoprazole (PROTONIX) EC tablet 20 mg  20 mg Oral Daily Connye Burkitt, NP   20 mg at 10/27/18 1287  . phenylephrine-shark liver oil-mineral oil-petrolatum (PREPARATION H) rectal ointment   Rectal BID PRN Connye Burkitt, NP      . prazosin (MINIPRESS) capsule 3 mg  3 mg Oral QHS Connye Burkitt, NP   3 mg at 10/26/18 2131  . sertraline (ZOLOFT) tablet 50 mg  50 mg Oral Daily Sharma Covert, MD   50 mg at 10/27/18 0803  . traZODone (DESYREL) tablet 50 mg  50 mg Oral QHS PRN Sharma Covert, MD       PTA Medications: Medications Prior to Admission  Medication Sig Dispense Refill Last Dose  . apixaban (ELIQUIS) 2.5 MG TABS tablet Take 1 tablet (2.5 mg total) by mouth 2 (two) times daily. For blood clot prevention 10 tablet 0 Unknown at Unknown time  .  dicyclomine (BENTYL) 20 MG tablet Take 20 mg by mouth 4 (four) times daily as needed for cramping.   Unknown at Unknown time  . gabapentin (NEURONTIN) 400 MG capsule Take 2 capsules (800 mg total) by mouth 3 (three) times daily. For agitation/diabetic neuropathy 180 capsule 0 Unknown at Unknown time  . OLANZapine (ZYPREXA) 5 MG tablet Take 1 tablet (5 mg) by mouth in the morning & 2 Tablets (10 mg) at bedtime: For mood control (Patient not taking: Reported on 10/02/2018) 90 tablet 0 Unknown at Unknown time  . sertraline (ZOLOFT) 50 MG tablet Take 1 tablet (50 mg total) by mouth daily. For depression (Patient not taking: Reported on 10/02/2018) 30 tablet 0 Unknown at Unknown time    Patient Stressors: Educational concerns Financial difficulties Health problems Marital or family conflict  Patient Strengths: Ability for insight  Treatment Modalities: Medication Management, Group  therapy, Case management,  1 to 1 session with clinician, Psychoeducation, Recreational therapy.   Physician Treatment Plan for Primary Diagnosis: MDD (major depressive disorder), recurrent severe, without psychosis (HCC) Long Term Goal(s): Improvement in symptoms so as ready for discharge Improvement in symptoms so as ready for discharge   Short Term Goals: Ability to identify changes in lifestyle to reduce recurrence of condition will improve Ability to verbalize feelings will improve Ability to disclose and discuss suicidal ideas Ability to demonstrate self-control will improve Ability to identify and develop effective coping behaviors will improve Ability to maintain clinical measurements within normal limits will improve Compliance with prescribed medications will improve Ability to identify triggers associated with substance abuse/mental health issues will improve Ability to identify changes in lifestyle to reduce recurrence of condition will improve Ability to verbalize feelings will improve Ability to disclose and discuss suicidal ideas Ability to demonstrate self-control will improve Ability to identify and develop effective coping behaviors will improve Ability to maintain clinical measurements within normal limits will improve Compliance with prescribed medications will improve Ability to identify triggers associated with substance abuse/mental health issues will improve  Medication Management: Evaluate patient's response, side effects, and tolerance of medication regimen.  Therapeutic Interventions: 1 to 1 sessions, Unit Group sessions and Medication administration.  Evaluation of Outcomes: Progressing  Physician Treatment Plan for Secondary Diagnosis: Principal Problem:   MDD (major depressive disorder), recurrent severe, without psychosis (HCC)  Long Term Goal(s): Improvement in symptoms so as ready for discharge Improvement in symptoms so as ready for discharge    Short Term Goals: Ability to identify changes in lifestyle to reduce recurrence of condition will improve Ability to verbalize feelings will improve Ability to disclose and discuss suicidal ideas Ability to demonstrate self-control will improve Ability to identify and develop effective coping behaviors will improve Ability to maintain clinical measurements within normal limits will improve Compliance with prescribed medications will improve Ability to identify triggers associated with substance abuse/mental health issues will improve Ability to identify changes in lifestyle to reduce recurrence of condition will improve Ability to verbalize feelings will improve Ability to disclose and discuss suicidal ideas Ability to demonstrate self-control will improve Ability to identify and develop effective coping behaviors will improve Ability to maintain clinical measurements within normal limits will improve Compliance with prescribed medications will improve Ability to identify triggers associated with substance abuse/mental health issues will improve     Medication Management: Evaluate patient's response, side effects, and tolerance of medication regimen.  Therapeutic Interventions: 1 to 1 sessions, Unit Group sessions and Medication administration.  Evaluation of Outcomes: Progressing   RN  Treatment Plan for Primary Diagnosis: MDD (major depressive disorder), recurrent severe, without psychosis (HCC) Long Term Goal(s): Knowledge of disease and therapeutic regimen to maintain health will improve  Short Term Goals: Ability to participate in decision making will improve, Ability to verbalize feelings will improve, Ability to disclose and discuss suicidal ideas, Ability to identify and develop effective coping behaviors will improve and Compliance with prescribed medications will improve  Medication Management: RN will administer medications as ordered by provider, will assess and evaluate  patient's response and provide education to patient for prescribed medication. RN will report any adverse and/or side effects to prescribing provider.  Therapeutic Interventions: 1 on 1 counseling sessions, Psychoeducation, Medication administration, Evaluate responses to treatment, Monitor vital signs and CBGs as ordered, Perform/monitor CIWA, COWS, AIMS and Fall Risk screenings as ordered, Perform wound care treatments as ordered.  Evaluation of Outcomes: Progressing   LCSW Treatment Plan for Primary Diagnosis: MDD (major depressive disorder), recurrent severe, without psychosis (HCC) Long Term Goal(s): Safe transition to appropriate next level of care at discharge, Engage patient in therapeutic group addressing interpersonal concerns.  Short Term Goals: Engage patient in aftercare planning with referrals and resources  Therapeutic Interventions: Assess for all discharge needs, 1 to 1 time with Social worker, Explore available resources and support systems, Assess for adequacy in community support network, Educate family and significant other(s) on suicide prevention, Complete Psychosocial Assessment, Interpersonal group therapy.  Evaluation of Outcomes: Progressing   Progress in Treatment: Attending groups: Yes. Participating in groups: Yes. Minimally  Taking medication as prescribed: Yes. Toleration medication: Yes. Family/Significant other contact made: No, will contact:  patient declined consent for collateral contacts Patient understands diagnosis: Yes. Discussing patient identified problems/goals with staff: Yes. Medical problems stabilized or resolved: Yes. Denies suicidal/homicidal ideation: Yes. Issues/concerns per patient self-inventory: No. Other:   New problem(s) identified: None   New Short Term/Long Term Goal(s):Detox, medication stabilization, elimination of SI thoughts, development of comprehensive mental wellness plan.    Patient Goals:    Discharge Plan or  Barriers: Patient plans to discharge home with his mother in [place], [place]. He will continue to follow up [facility] for outpatient medication management and therapy services.    Reason for Continuation of Hospitalization: Medication stabilization  Estimated Length of Stay: 1-2 days   Attendees: Patient: 10/27/2018 10:20 AM  Physician: Dr. Landry Mellow, MD; Dr. Nehemiah Massed, MD 10/27/2018 10:20 AM  Nursing: Casimiro Needle.S, RN; Patrice. Lacretia Nicks, RN 10/27/2018 10:20 AM  RN Care Manager: 10/27/2018 10:20 AM  Social Worker: Baldo Daub, LCSWA 10/27/2018 10:20 AM  Recreational Therapist:  10/27/2018 10:20 AM  Other:  10/27/2018 10:20 AM  Other:  10/27/2018 10:20 AM  Other: 10/27/2018 10:20 AM    Scribe for Treatment Team: [name], LCSWA 10/27/2018 10:20 AM

## 2018-10-27 NOTE — Progress Notes (Signed)
Pt rates depression 6/10, anxiety 6/10, and hopelessness 8/10. Pt denies SI/HI. Pt reports fair sleep at bedtime. Pt expressed that his mood is inconsistent and one minute he's feeling fine and the next minute he's worrying and feeling increasingly depressed.  Pt compliant with taking medications and denies any side effects.   Orders reviewed with pt. Verbal support provided. Pt encouraged to attend groups. 15 minute checks performed for safety.   Pt compliant with tx plan.

## 2018-10-27 NOTE — [facility] Group Notes (Signed)
LCSW Group Therapy Note 10/27/2018 3:26 PM  Type of Therapy/Topic: Group Therapy: Feelings about Diagnosis  Participation Level: Active   Description of Group:  This group will allow patients to explore their thoughts and feelings about diagnoses they have received. Patients will be guided to explore their level of understanding and acceptance of these diagnoses. Facilitator will encourage patients to process their thoughts and feelings about the reactions of others to their diagnosis and will guide patients in identifying ways to discuss their diagnosis with significant others in their lives. This group will be process-oriented, with patients participating in exploration of their own experiences, giving and receiving support, and processing challenge from other group members.  Therapeutic Goals: 1. Patient will demonstrate understanding of diagnosis as evidenced by identifying two or more symptoms of the disorder 2. Patient will be able to express two feelings regarding the diagnosis 3. Patient will demonstrate their ability to communicate their needs through discussion and/or role play  Summary of Patient Progress:  Dale [name] was engaged and participated throughout the group session. Dale [name] shared that initially he was in denial about his diagnosis back in the beginning. However, he reports he has accepted the fact that he has a mental health diagnosis and now is confident in his plan to recovery.     Therapeutic Modalities:  Cognitive Behavioral Therapy Brief Therapy Feelings Identification    Redbird Smith Clinical Social Worker

## 2018-10-27 NOTE — Progress Notes (Signed)
Adult Psychoeducational Group Note  Date:  10/27/2018 Time:  9:25 PM  Group Topic/Focus:  Wrap-Up Group:   The focus of this group is to help patients review their daily goal of treatment and discuss progress on daily workbooks.  Participation Level:  Active  Participation Quality:  Sharing and Supportive  Affect:  Appropriate  Cognitive:  Appropriate  Insight: Appropriate  Engagement in Group:  Engaged and Supportive  Modes of Intervention:  Discussion  Additional Comments:  Pt stated his goal is to ger his meds regulated and that is ongoing.  Pt stated he is feeling good mentally and physically.  Pt rated the day at a 6/10.  Calianne Larue 10/27/2018, 9:25 PM

## 2018-10-27 NOTE — Progress Notes (Signed)
Dale [name] NOVEL CORONAVIRUS (COVID-19) DAILY CHECK-OFF SYMPTOMS - answer yes or no to each - every day NO YES  Have you had a fever in the past 24 hours?  . Fever (Temp > 37.80C / 100F) X   Have you had any of these symptoms in the past 24 hours? . New Cough .  Sore Throat  .  Shortness of Breath .  Difficulty Breathing .  Unexplained Body Aches   X   Have you had any one of these symptoms in the past 24 hours not related to allergies?   . Runny Nose .  Nasal Congestion .  Sneezing   X   If you have had runny nose, nasal congestion, sneezing in the past 24 hours, has it worsened?  X   EXPOSURES - check yes or no X   Have you traveled outside the state in the past 14 days?  X   Have you been in contact with someone with a confirmed diagnosis of COVID-19 or PUI in the past 14 days without wearing appropriate PPE?  X   Have you been living in the same home as a person with confirmed diagnosis of COVID-19 or a PUI (household contact)?    X   Have you been diagnosed with COVID-19?    X              What to do next: Answered NO to all: Answered YES to anything:   Proceed with unit schedule Follow the [facility] Inpatient Flowsheet.   

## 2018-10-28 LAB — GLUCOSE, CAPILLARY: Glucose-Capillary: 81 mg/dL (ref 70–99)

## 2018-10-28 NOTE — Progress Notes (Signed)
D.  Pt pleasant on approach, continued complaint of right hip pain.  Pt states pain does not get below an 8/10 but states that his plan is to keep the pain at a controlled level with ordered Tramadol.  Pt observed out in milieu interacting appropriately with peers on the unit.  Pt denies SI/HI/AVH at this time.  A.  Support and encouragement offered, medication given as ordered  R. Pt remains safe on the unit, will continue to monitor.

## 2018-10-28 NOTE — Progress Notes (Signed)
Pt rates depression 7/10, anxiety 7/10, and hopelessness 5/10. Pt denies SI/HI. Pt denies difficulty sleeping. Pt expressed feeling better today and stated that the Tramadol is helping to control his pain. Pt stated goal "staying positive." Pt gait is unsteady and the pt ambulates with a cane. Pt compliant with taking meds and no side effects verbalized by the pt.   Orders reviewed with the pt. Verbal support provided. 15 minute checks performed for safety.  Pt compliant with tx plan.   [place] NOVEL CORONAVIRUS (COVID-19) DAILY CHECK-OFF SYMPTOMS - answer yes or no to each - every day NO YES  Have you had a fever in the past 24 hours?  . Fever (Temp > 37.80C / 100F) X   Have you had any of these symptoms in the past 24 hours? . New Cough .  Sore Throat  .  Shortness of Breath .  Difficulty Breathing .  Unexplained Body Aches   X   Have you had any one of these symptoms in the past 24 hours not related to allergies?   . Runny Nose .  Nasal Congestion .  Sneezing   X   If you have had runny nose, nasal congestion, sneezing in the past 24 hours, has it worsened?  X   EXPOSURES - check yes or no X   Have you traveled outside the state in the past 14 days?  X   Have you been in contact with someone with a confirmed diagnosis of COVID-19 or PUI in the past 14 days without wearing appropriate PPE?  X   Have you been living in the same home as a person with confirmed diagnosis of COVID-19 or a PUI (household contact)?    X   Have you been diagnosed with COVID-19?    X              What to do next: Answered NO to all: Answered YES to anything:   Proceed with unit schedule Follow the [facility] Inpatient Flowsheet.

## 2018-10-28 NOTE — [facility] Group Notes (Signed)
Identifying Needs  Date:  10/28/2018  Time:  3:45 PM  Type of Therapy:  Nurse Education   /   The group focuses on teaching patients hwo to identify their needs and then hwo to develop healtheir coping skills so they can get their needs met.   Participation Level:  Active  Participation Quality:  Appropriate  Affect:  Appropriate  Cognitive:  Alert  Insight:  Good  Engagement in Group:  Engaged  Modes of Intervention:  Education  Summary of Progress/Problems:  Lauralyn Primes 10/28/2018, 3:45 PM

## 2018-10-28 NOTE — [facility] Group Notes (Signed)
  [facility] LCSW Group Therapy Note  Date/Time: 10/28/2018 @ 3pm  Type of Therapy/Topic:  Group Therapy:  Emotion Regulation  Participation Level:  Active   Mood: Pleasant  Description of Group:    The purpose of this group is to assist patients in learning to regulate negative emotions and experience positive emotions. Patients will be guided to discuss ways in which they have been vulnerable to their negative emotions. These vulnerabilities will be juxtaposed with experiences of positive emotions or situations, and patients challenged to use positive emotions to combat negative ones. Special emphasis will be placed on coping with negative emotions in conflict situations, and patients will process healthy conflict resolution skills.  Therapeutic Goals: 1. Patient will identify two positive emotions or experiences to reflect on in order to balance out negative emotions:  2. Patient will label two or more emotions that they find the most difficult to experience:  3. Patient will be able to demonstrate positive conflict resolution skills through discussion or role plays:   Summary of Patient Progress:   Patient was engaged throughout group. Patient was able to reflect on how he balances out his negative emotions. Pt reports that he does constructive things such as cleaning and fixing his car. Patient reports that he self reflects and knows that he needs to be distracted to handle his emotions. Patient was able to identify two emotions that he finds the most difficult which are fear and jealousy.     Therapeutic Modalities:   Cognitive Behavioral Therapy Feelings Identification Dialectical Behavioral Therapy   Ardelle Anton, LCSW
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
## 2018-10-29 LAB — GLUCOSE, CAPILLARY: Glucose-Capillary: 98 mg/dL (ref 70–99)
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
## 2018-10-29 NOTE — Progress Notes (Signed)
D: Pt denies SI/HI/AVH. Pt is pleasant and cooperative. Pt visible on the unit A: Pt was offered support and encouragement. Pt was given scheduled medications. Pt was encourage to attend groups. Q 15 minute checks were done for safety.  R:Pt attends groups and interacts well with peers and staff. Pt is taking medication. Pt has no complaints.Pt receptive to treatment and safety maintained on unit.

## 2018-10-29 NOTE — [facility] Group Notes (Signed)
[place] Group Notes:  (Nursing/MHT/Case Management/Adjunct)  Date:  10/29/2018  Time:  1:44 PM  Type of Therapy:  Psychoeducational Skills  Participation Level:  Active  Participation Quality:  Appropriate and Attentive  Affect:  Appropriate  Cognitive:  Alert and Appropriate  Insight:  Appropriate and Good  Engagement in Group:  Engaged  Modes of Intervention:  Discussion and Education  Summary of Progress/Problems:  Patient was attentive and receptive.  Dale [name] 10/29/2018, 1:44 PM

## 2018-10-29 NOTE — Progress Notes (Signed)
[place] NOVEL CORONAVIRUS (COVID-19) DAILY CHECK-OFF SYMPTOMS - answer yes or no to each - every day NO YES  Have you had a fever in the past 24 hours?  . Fever (Temp > 37.80C / 100F) X   Have you had any of these symptoms in the past 24 hours? . New Cough .  Sore Throat  .  Shortness of Breath .  Difficulty Breathing .  Unexplained Body Aches   X   Have you had any one of these symptoms in the past 24 hours not related to allergies?   . Runny Nose .  Nasal Congestion .  Sneezing   X   If you have had runny nose, nasal congestion, sneezing in the past 24 hours, has it worsened?  X   EXPOSURES - check yes or no X   Have you traveled outside the state in the past 14 days?  X   Have you been in contact with someone with a confirmed diagnosis of COVID-19 or PUI in the past 14 days without wearing appropriate PPE?  X   Have you been living in the same home as a person with confirmed diagnosis of COVID-19 or a PUI (household contact)?    X   Have you been diagnosed with COVID-19?    X              What to do next: Answered NO to all: Answered YES to anything:   Proceed with unit schedule Follow the [facility] Inpatient Flowsheet.   

## 2018-10-29 NOTE — Progress Notes (Signed)
D. Pt has been friendly upon approach- visible in the milieu interacting appropriately with peers and staff. Pt continued to complain of chronic hip pain - rated 10/10 this am and brought down to 6/10 after prn Ultram. Per pt's self inventory, pt rates his depression, hopelessness and anxiety a 6/5/5/, respectively. Pt writes that his goal today is "myself" and writes that he will "think positive at all times" to help him to meet that goal. Pt currently denies SI/HI and AVH  A. Labs and vitals monitored. Pt compliant with medications. Pt supported emotionally and encouraged to express concerns and ask questions.   R. Pt remains safe with 15 minute checks. Will continue POC.

## 2018-10-29 NOTE — Progress Notes (Signed)
Adult Psychoeducational Group Note  Date:  10/29/2018 Time:  10:48 PM  Group Topic/Focus:  Wrap-Up Group:   The focus of this group is to help patients review their daily goal of treatment and discuss progress on daily workbooks.  Participation Level:  Active  Participation Quality:  Appropriate  Affect:  Appropriate  Cognitive:  Appropriate  Insight: Appropriate  Engagement in Group:  Engaged  Modes of Intervention:  Discussion  Additional Comments:  Patient attended group and said that his day was a 7. His goal for today was to stay strong and thing positive.  Akelia Husted W Azhar Yogi 11/04/6551, 10:48 PM

## 2018-10-29 NOTE — [facility] Group Notes (Signed)
[place] LCSW Group Therapy Note  Date/Time:  10/29/2018 9:00-10:00 or 10:00-11:00AM  Type of Therapy and Topic:  Group Therapy:  Healthy and Unhealthy Supports  Participation Level:  Active   Description of Group:  Patients in this group were introduced to the idea of adding a variety of healthy supports to address the various needs in their lives.Patients discussed what additional healthy supports could be helpful in their recovery and wellness after discharge in order to prevent future hospitalizations.   An emphasis was placed on using counselor, doctor, therapy groups, 12-step groups, and problem-specific support groups to expand supports.  They also worked as a group on developing a specific plan for several patients to deal with unhealthy supports through Progress, psychoeducation with loved ones, and even termination of relationships.   Therapeutic Goals:   1)  discuss importance of adding supports to stay well once out of the hospital  2)  compare healthy versus unhealthy supports and identify some examples of each  3)  generate ideas and descriptions of healthy supports that can be added  4)  offer mutual support about how to address unhealthy supports  5)  encourage active participation in and adherence to discharge plan    Summary of Patient Progress:  The patient stated that current healthy supports in his  life are kids while current unhealthy supports include other family.  The patient expressed a willingness to add therapy and to stay on his medicines as support to help in his recovery journey.   Therapeutic Modalities:   Motivational Interviewing Brief Solution-Focused Therapy  Rolanda Jay

## 2018-10-29 NOTE — Progress Notes (Signed)
Adult Psychoeducational Group Note  Date:  10/29/2018 Time:  12:01 AM  Group Topic/Focus:  Wrap-Up Group:   The focus of this group is to help patients review their daily goal of treatment and discuss progress on daily workbooks.  Participation Level:  Active  Participation Quality:  Appropriate  Affect:  Appropriate  Cognitive:  Appropriate  Insight: Appropriate  Engagement in Group:  Engaged  Modes of Intervention:  Discussion  Additional Comments:  Pt stated his goal was to stay focused while adjusting to new meds and this goal is ongoing. Pt feels like the meds are making him feel lethargic and sleepy at times. Pt stated he is in a much better state of mind than when he was first admitted.  Pt rated the day at a 7/10.  Dale [name] 10/29/2018, 12:01 AM

## 2018-10-30 LAB — GLUCOSE, CAPILLARY: Glucose-Capillary: 90 mg/dL (ref 70–99)

## 2018-10-30 MED ORDER — GABAPENTIN 400 MG PO CAPS: 800.0000 mg | ORAL_CAPSULE | Freq: Three times a day (TID) | ORAL | 0 refills | Status: DC

## 2018-10-30 MED ORDER — DICYCLOMINE HCL 20 MG PO TABS: 20.0000 mg | ORAL_TABLET | Freq: Three times a day (TID) | ORAL | 0 refills | Status: DC | PRN

## 2018-10-30 MED ORDER — PANTOPRAZOLE SODIUM 20 MG PO TBEC: 20.0000 mg | DELAYED_RELEASE_TABLET | Freq: Every day | ORAL | 0 refills | Status: DC

## 2018-10-30 MED ORDER — OLANZAPINE 2.5 MG PO TABS: 12.5000 mg | ORAL_TABLET | Freq: Every day | ORAL | 0 refills | Status: DC

## 2018-10-30 MED ORDER — SERTRALINE HCL 50 MG PO TABS: 50.0000 mg | ORAL_TABLET | Freq: Every day | ORAL | 0 refills | Status: DC

## 2018-10-30 MED ORDER — METFORMIN HCL 500 MG PO TABS: 500.0000 mg | ORAL_TABLET | Freq: Every day | ORAL | 0 refills | Status: DC

## 2018-10-30 MED ORDER — PRAZOSIN HCL 1 MG PO CAPS: 3.0000 mg | ORAL_CAPSULE | Freq: Every day | ORAL | 0 refills | Status: DC

## 2018-10-30 MED ORDER — HYDROXYZINE HCL 25 MG PO TABS: 25.0000 mg | ORAL_TABLET | Freq: Three times a day (TID) | ORAL | 0 refills | Status: DC | PRN

## 2018-10-30 MED ORDER — APIXABAN 2.5 MG PO TABS: 2.5000 mg | ORAL_TABLET | Freq: Two times a day (BID) | ORAL | 0 refills | Status: DC

## 2018-10-30 NOTE — Progress Notes (Signed)
Recreation Therapy Notes  Date:  7.27.20 Time: 0930 Location: [address] Dayroom  Group Topic: Stress Management  Goal Area(s) Addresses:  Patient will identify positive stress management techniques. Patient will identify benefits of using stress management post d/c.  Intervention:  Stress Management  Activity :  Meditation.  LRT introduced the stress management technique of meditation.  LRT played [name] meditation that focused on making the most of your day.  Patients were to listen to follow along as listen meditation played.  Education:  Stress Management, Discharge Planning.   Education Outcome: Acknowledges Education  Clinical Observations/Feedback: Pt did not attend group.     Dale [name], LRT/CTRS         Dale [name] 10/30/2018 10:58 AM 
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
## 2018-10-30 NOTE — Progress Notes (Signed)
Discharge note: Patient reviewed discharge paperwork with RN including prescriptions, follow up appointments, and lab work. Patient given the opportunity to ask questions. All concerns were addressed. All belongings were returned to patient. Denied SI/HI/AVH. Patient thanked staff for their care while at the hospital.  Patient was discharged to the lobby where his ride was waiting to pick him up. CSW ordered patient a Lyft.

## 2018-10-30 NOTE — Plan of Care (Signed)
  Problem: Education: Goal: Ability to make informed decisions regarding treatment will improve Outcome: Adequate for Discharge   Problem: Coping: Goal: Coping ability will improve Outcome: Adequate for Discharge   Problem: Health Behavior/Discharge Planning: Goal: Identification of resources available to assist in meeting health care needs will improve Outcome: Adequate for Discharge   Problem: Medication: Goal: Compliance with prescribed medication regimen will improve Outcome: Adequate for Discharge   Problem: Self-Concept: Goal: Ability to disclose and discuss suicidal ideas will improve Outcome: Adequate for Discharge   Problem: Self-Concept: Goal: Will verbalize positive feelings about self Outcome: Adequate for Discharge

## 2018-10-30 NOTE — BHH Suicide Risk Assessment (Signed)
[facility] Discharge Suicide Risk Assessment   Principal Problem: MDD (major depressive disorder), recurrent severe, without psychosis (HCC) Discharge Diagnoses: Principal Problem:   MDD (major depressive disorder), recurrent severe, without psychosis (HCC)   Total Time spent with patient: 30 minutes  Musculoskeletal: Strength & Muscle Tone: within normal limits Gait & Station: normal- walks with cane  Patient leans: N/A  Psychiatric Specialty Exam: ROS no headache, no chest pain, no shortness of breath, no cough, no fever, no chills, no bleeding  Blood pressure 93/72, pulse 91, temperature (!) 97.5 F (36.4 C), temperature source Oral, resp. rate 20, height 5\' 11"  (1.803 m), weight 77.1 kg, SpO2 98 %.Body mass index is 23.71 kg/m.  General Appearance: Well Groomed  Eye Contact::  Good  Speech:  Normal Rate409  Volume:  Normal  Mood:  reports mood is improved and states " I feel good today", states he feels " a lot better"  Affect:  Appropriate and fuller in range  Thought Process:  Linear and Descriptions of Associations: Intact  Orientation:  Full (Time, Place, and Person)  Thought Content:  no hallucinations, no delusions , not internally preoccupied   Suicidal Thoughts:  No denies suicidal or self injurious ideations  Homicidal Thoughts:  No  Memory:  recent and remote grossly intact   Judgement:  Other:  improving   Insight:  improving   Psychomotor Activity:  Normal  Concentration:  Good  Recall:  Good  Fund of Knowledge:Good  Language: Good  Akathisia:  Negative  Handed:  Right  AIMS (if indicated):     Assets:  Communication Skills Resilience  Sleep:  Number of Hours: 5.5  Cognition: WNL  ADL's:  Intact   Mental Status Per Nursing Assessment::   On Admission:  Suicidal ideation indicated by others  Demographic Factors:  53, has 2 adult children, plans to live with his mother after discharge  Loss Factors: Had stopped medication, relationship stressors, relapse    Historical Factors: History of prior psychiatric admissions, reports he has been diagnosed with Bipolar Disorder in the past, history of Cocaine Abuse   Risk Reduction Factors:   Sense of responsibility to family, Living with another person, especially a relative and Positive coping skills or problem solving skills  Continued Clinical Symptoms:  At this time patient presents with improving mood and a fuller range of affect, states he is feeling  " a lot better" than on admission. Currently denies SI or HI, denies psychotic symptoms and does not appear internally preoccupied, no delusions are expressed. Future oriented . Behavior on unit calm and in good control, pleasant on approach. Denies medication side effects- we have reviewed medication side effects, including risk of movement disorders, TD, metabolic disorders/weight gain, sedation , NMS on Zyprexa, and potential increased risk of bleeding with regards to being on Eliquis. He is leaving unit in good spirits .  Cognitive Features That Contribute To Risk:  No gross cognitive deficits noted upon discharge. Is alert , attentive, and oriented x 3   Suicide Risk:  Mild:  Suicidal ideation of limited frequency, intensity, duration, and specificity.  There are no identifiable plans, no associated intent, mild dysphoria and related symptoms, good self-control (both objective and subjective assessment), few other risk factors, and identifiable protective factors, including available and accessible social support.  Follow-up Information    [facility] Follow up on 11/02/2018.   Why: Hospital follow up appointment is Thursday, 7/30 at 10:30a.  Please bring your current medications and discharge paperwork from this hospitalization.  Contact information: [address] [place] [address] [phone]           Plan Of Care/Follow-up recommendations:  Activity:  as tolerated Diet:  heart healthy, diabetic diet  Tests:  NA Other:   See below  Patient expressing readiness for discharge, leaving unit in good spirits, plans to go live with his mother  Plans to follow up as above  Referred to [facility] for medical management   Jenne Campus, MD 10/30/2018, 12:41 PM

## 2018-10-30 NOTE — Progress Notes (Signed)
  [facility] Adult Case Management Discharge Plan :  Will you be returning to the same living situation after discharge:  No. Patient is discharging home with his mother in [place], [place].  At discharge, do you have transportation home?: Yes,  [employer] (Lyft) Do you have the ability to pay for your medications: No.  Release of information consent forms completed and in the chart;  Patient's signature needed at discharge.  Patient to Follow up at: Follow-up Information    [facility] Follow up on 11/02/2018.   Why: Hospital follow up appointment is Thursday, 7/30 at 10:30a.  Please bring your current medications and discharge paperwork from this hospitalization.  Contact information: [place] [address] [phone]        [place]. Call.   Why: Please call and establish services for a medical follow up appointment.  Contact information: [address] [place] [address] [phone]          Next level of care provider has access to [place] and Suicide Prevention discussed: Yes,  with the patient  Have you used any form of tobacco in the last 30 days? (Cigarettes, Smokeless Tobacco, Cigars, and/or Pipes): Yes  Has patient been referred to the Quitline?: Patient refused referral  Patient has been referred for addiction treatment: Pt. refused referral  Marylee Floras, [place] 10/30/2018, 2:18 PM

## 2018-12-27 ENCOUNTER — Ambulatory Visit (HOSPITAL_COMMUNITY)
Admission: RE | Admit: 2018-12-27 | Discharge: 2018-12-27 | Disposition: A | Discharge status: Home / Self Care | Payer: Federal, State, Local not specified - Other | Attending: Psychiatry | Admitting: Psychiatry

## 2018-12-27 DIAGNOSIS — F1490 Cocaine use, unspecified, uncomplicated: Secondary | ICD-10-CM | POA: Insufficient documentation

## 2018-12-27 DIAGNOSIS — G4700 Insomnia, unspecified: Secondary | ICD-10-CM | POA: Insufficient documentation

## 2018-12-27 DIAGNOSIS — F333 Major depressive disorder, recurrent, severe with psychotic symptoms: Secondary | ICD-10-CM | POA: Insufficient documentation

## 2018-12-27 DIAGNOSIS — F17210 Nicotine dependence, cigarettes, uncomplicated: Secondary | ICD-10-CM | POA: Insufficient documentation

## 2018-12-27 DIAGNOSIS — Z915 Personal history of self-harm: Secondary | ICD-10-CM | POA: Insufficient documentation

## 2018-12-27 DIAGNOSIS — U071 COVID-19: Secondary | ICD-10-CM | POA: Insufficient documentation

## 2018-12-27 DIAGNOSIS — R45851 Suicidal ideations: Secondary | ICD-10-CM | POA: Insufficient documentation

## 2018-12-28 ENCOUNTER — Inpatient Hospital Stay (HOSPITAL_COMMUNITY)
Admission: AD | Admit: 2018-12-28 | Discharge: 2019-01-01 | DRG: 177 | Disposition: A | Discharge status: Home / Self Care | Payer: HRSA Program | Source: Ambulatory Visit | Attending: Internal Medicine | Admitting: Internal Medicine

## 2018-12-28 ENCOUNTER — Observation Stay (HOSPITAL_COMMUNITY)
Admission: AD | Admit: 2018-12-28 | Discharge: 2018-12-28 | Payer: Federal, State, Local not specified - Other | Attending: Psychiatry | Admitting: Psychiatry

## 2018-12-28 ENCOUNTER — Encounter (HOSPITAL_COMMUNITY): Payer: Self-pay

## 2018-12-28 ENCOUNTER — Other Ambulatory Visit: Payer: Self-pay

## 2018-12-28 DIAGNOSIS — Z7984 Long term (current) use of oral hypoglycemic drugs: Secondary | ICD-10-CM | POA: Insufficient documentation

## 2018-12-28 DIAGNOSIS — R0602 Shortness of breath: Secondary | ICD-10-CM

## 2018-12-28 DIAGNOSIS — E119 Type 2 diabetes mellitus without complications: Secondary | ICD-10-CM | POA: Insufficient documentation

## 2018-12-28 DIAGNOSIS — Z79899 Other long term (current) drug therapy: Secondary | ICD-10-CM | POA: Insufficient documentation

## 2018-12-28 DIAGNOSIS — I8290 Acute embolism and thrombosis of unspecified vein: Secondary | ICD-10-CM | POA: Diagnosis present

## 2018-12-28 DIAGNOSIS — F419 Anxiety disorder, unspecified: Secondary | ICD-10-CM | POA: Insufficient documentation

## 2018-12-28 DIAGNOSIS — U071 COVID-19: Secondary | ICD-10-CM | POA: Insufficient documentation

## 2018-12-28 DIAGNOSIS — E1122 Type 2 diabetes mellitus with diabetic chronic kidney disease: Secondary | ICD-10-CM | POA: Diagnosis present

## 2018-12-28 DIAGNOSIS — Z886 Allergy status to analgesic agent status: Secondary | ICD-10-CM | POA: Insufficient documentation

## 2018-12-28 DIAGNOSIS — F1994 Other psychoactive substance use, unspecified with psychoactive substance-induced mood disorder: Secondary | ICD-10-CM | POA: Diagnosis present

## 2018-12-28 DIAGNOSIS — F333 Major depressive disorder, recurrent, severe with psychotic symptoms: Secondary | ICD-10-CM | POA: Diagnosis not present

## 2018-12-28 DIAGNOSIS — J1289 Other viral pneumonia: Secondary | ICD-10-CM | POA: Diagnosis present

## 2018-12-28 DIAGNOSIS — I129 Hypertensive chronic kidney disease with stage 1 through stage 4 chronic kidney disease, or unspecified chronic kidney disease: Secondary | ICD-10-CM | POA: Diagnosis present

## 2018-12-28 DIAGNOSIS — Z809 Family history of malignant neoplasm, unspecified: Secondary | ICD-10-CM

## 2018-12-28 DIAGNOSIS — F17210 Nicotine dependence, cigarettes, uncomplicated: Secondary | ICD-10-CM | POA: Insufficient documentation

## 2018-12-28 DIAGNOSIS — N183 Chronic kidney disease, stage 3 (moderate): Secondary | ICD-10-CM | POA: Diagnosis present

## 2018-12-28 DIAGNOSIS — Z833 Family history of diabetes mellitus: Secondary | ICD-10-CM

## 2018-12-28 DIAGNOSIS — R630 Anorexia: Secondary | ICD-10-CM | POA: Insufficient documentation

## 2018-12-28 DIAGNOSIS — R45851 Suicidal ideations: Secondary | ICD-10-CM | POA: Diagnosis present

## 2018-12-28 DIAGNOSIS — R45850 Homicidal ideations: Secondary | ICD-10-CM | POA: Diagnosis present

## 2018-12-28 DIAGNOSIS — I10 Essential (primary) hypertension: Secondary | ICD-10-CM | POA: Insufficient documentation

## 2018-12-28 DIAGNOSIS — Z915 Personal history of self-harm: Secondary | ICD-10-CM | POA: Insufficient documentation

## 2018-12-28 DIAGNOSIS — F312 Bipolar disorder, current episode manic severe with psychotic features: Secondary | ICD-10-CM | POA: Diagnosis present

## 2018-12-28 DIAGNOSIS — F4310 Post-traumatic stress disorder, unspecified: Secondary | ICD-10-CM | POA: Diagnosis present

## 2018-12-28 DIAGNOSIS — A4189 Other specified sepsis: Principal | ICD-10-CM | POA: Diagnosis present

## 2018-12-28 DIAGNOSIS — Z7901 Long term (current) use of anticoagulants: Secondary | ICD-10-CM | POA: Insufficient documentation

## 2018-12-28 DIAGNOSIS — K279 Peptic ulcer, site unspecified, unspecified as acute or chronic, without hemorrhage or perforation: Secondary | ICD-10-CM | POA: Diagnosis present

## 2018-12-28 DIAGNOSIS — F1424 Cocaine dependence with cocaine-induced mood disorder: Secondary | ICD-10-CM | POA: Diagnosis present

## 2018-12-28 LAB — COMPREHENSIVE METABOLIC PANEL
ALT: 14 U/L (ref 0–44)
AST: 23 U/L (ref 15–41)
Albumin: 3.1 g/dL — ABNORMAL LOW (ref 3.5–5.0)
Alkaline Phosphatase: 61 U/L (ref 38–126)
Anion gap: 8 (ref 5–15)
BUN: 13 mg/dL (ref 6–20)
CO2: 25 mmol/L (ref 22–32)
Calcium: 8.1 mg/dL — ABNORMAL LOW (ref 8.9–10.3)
Chloride: 108 mmol/L (ref 98–111)
Creatinine, Ser: 1.34 mg/dL — ABNORMAL HIGH (ref 0.61–1.24)
GFR calc Af Amer: >60 mL/min (ref >60)
GFR calc non Af Amer: >60 mL/min (ref >60)
Glucose, Bld: 109 mg/dL — ABNORMAL HIGH (ref 70–99)
Potassium: 3.9 mmol/L (ref 3.5–5.1)
Sodium: 141 mmol/L (ref 135–145)
Total Bilirubin: <0.1 mg/dL — ABNORMAL LOW (ref 0.3–1.2)
Total Protein: 5.9 g/dL — ABNORMAL LOW (ref 6.5–8.1)

## 2018-12-28 LAB — CBC WITH DIFFERENTIAL/PLATELET
Abs Immature Granulocytes: 0 10*3/uL (ref 0.00–0.07)
Basophils Absolute: 0 10*3/uL (ref 0.0–0.1)
Basophils Relative: 1 %
Eosinophils Absolute: 0 10*3/uL (ref 0.0–0.5)
Eosinophils Relative: 1 %
HCT: 44.4 % (ref 39.0–52.0)
Hemoglobin: 13.9 g/dL (ref 13.0–17.0)
Immature Granulocytes: 0 %
Lymphocytes Relative: 44 %
Lymphs Abs: 1 10*3/uL (ref 0.7–4.0)
MCH: 28.2 pg (ref 26.0–34.0)
MCHC: 31.3 g/dL (ref 30.0–36.0)
MCV: 90.1 fL (ref 80.0–100.0)
Monocytes Absolute: 0.3 10*3/uL (ref 0.1–1.0)
Monocytes Relative: 12 %
Neutro Abs: 0.9 10*3/uL — ABNORMAL LOW (ref 1.7–7.7)
Neutrophils Relative %: 42 %
Platelets: 121 10*3/uL — ABNORMAL LOW (ref 150–400)
RBC: 4.93 MIL/uL (ref 4.22–5.81)
RDW: 13.6 % (ref 11.5–15.5)
WBC: 2.1 10*3/uL — ABNORMAL LOW (ref 4.0–10.5)
nRBC: 0 % (ref 0.0–0.2)

## 2018-12-28 LAB — PROCALCITONIN: Procalcitonin: <0.1 ng/mL

## 2018-12-28 LAB — C-REACTIVE PROTEIN: CRP: 1.5 mg/dL — ABNORMAL HIGH (ref <1.0)

## 2018-12-28 LAB — D-DIMER, QUANTITATIVE: D-Dimer, Quant: 2.08 ug/mL-FEU — ABNORMAL HIGH (ref 0.00–0.50)

## 2018-12-28 LAB — TROPONIN I (HIGH SENSITIVITY): Troponin I (High Sensitivity): 2 ng/L (ref <18)

## 2018-12-28 LAB — SARS CORONAVIRUS 2 BY RT PCR (HOSPITAL ORDER, PERFORMED IN ~~LOC~~ HOSPITAL LAB): SARS Coronavirus 2: POSITIVE — AB

## 2018-12-28 MED ORDER — METFORMIN HCL 500 MG PO TABS
500.0000 mg | ORAL_TABLET | Freq: Every day | ORAL | Status: DC
  Administered 2018-12-29: 500 mg via ORAL
  Filled 2018-12-28: qty 1

## 2018-12-28 MED ORDER — ZINC SULFATE 220 (50 ZN) MG PO CAPS
220.0000 mg | ORAL_CAPSULE | Freq: Every day | ORAL | Status: DC
  Administered 2018-12-28 – 2019-01-01 (×5): 220 mg via ORAL
  Filled 2018-12-28 (×5): qty 1

## 2018-12-28 MED ORDER — ONDANSETRON HCL 4 MG/2ML IJ SOLN
4.0000 mg | Freq: Four times a day (QID) | INTRAMUSCULAR | Status: DC | PRN
  Administered 2018-12-30: 4 mg via INTRAVENOUS
  Filled 2018-12-28: qty 2

## 2018-12-28 MED ORDER — SODIUM CHLORIDE 0.9 % IV SOLN: 250.0000 mL | INTRAVENOUS | Status: DC | PRN

## 2018-12-28 MED ORDER — TRAZODONE HCL 100 MG PO TABS: 100.0000 mg | ORAL_TABLET | Freq: Every evening | ORAL | Status: DC | PRN

## 2018-12-28 MED ORDER — OLANZAPINE 5 MG PO TBDP
ORAL_TABLET | ORAL | Status: AC
  Administered 2018-12-28: 5 mg
  Filled 2018-12-28: qty 1

## 2018-12-28 MED ORDER — LORAZEPAM 1 MG PO TABS
2.0000 mg | ORAL_TABLET | Freq: Once | ORAL | Status: DC
  Filled 2018-12-28: qty 2

## 2018-12-28 MED ORDER — ALUM & MAG HYDROXIDE-SIMETH 200-200-20 MG/5ML PO SUSP: 30.0000 mL | ORAL | Status: DC | PRN

## 2018-12-28 MED ORDER — SODIUM CHLORIDE 0.9% FLUSH: 3.0000 mL | INTRAVENOUS | Status: DC | PRN

## 2018-12-28 MED ORDER — HYDROXYZINE HCL 25 MG PO TABS: 25.0000 mg | ORAL_TABLET | Freq: Three times a day (TID) | ORAL | Status: DC | PRN

## 2018-12-28 MED ORDER — ACETAMINOPHEN 325 MG PO TABS
650.0000 mg | ORAL_TABLET | Freq: Four times a day (QID) | ORAL | Status: DC | PRN
  Administered 2018-12-28: 650 mg via ORAL
  Filled 2018-12-28: qty 2

## 2018-12-28 MED ORDER — VITAMIN C 500 MG PO TABS
500.0000 mg | ORAL_TABLET | Freq: Every day | ORAL | Status: DC
  Administered 2018-12-28 – 2019-01-01 (×5): 500 mg via ORAL
  Filled 2018-12-28 (×5): qty 1

## 2018-12-28 MED ORDER — ONDANSETRON HCL 4 MG PO TABS: 4.0000 mg | ORAL_TABLET | Freq: Four times a day (QID) | ORAL | Status: DC | PRN

## 2018-12-28 MED ORDER — MAGNESIUM HYDROXIDE 400 MG/5ML PO SUSP: 30.0000 mL | Freq: Every day | ORAL | Status: DC | PRN

## 2018-12-28 MED ORDER — OLANZAPINE 5 MG PO TABS
5.0000 mg | ORAL_TABLET | Freq: Two times a day (BID) | ORAL | Status: DC
  Administered 2018-12-28 – 2018-12-29 (×2): 5 mg via ORAL
  Filled 2018-12-28 (×5): qty 1

## 2018-12-28 MED ORDER — OLANZAPINE 5 MG PO TBDP: 5.0000 mg | ORAL_TABLET | Freq: Two times a day (BID) | ORAL | Status: DC

## 2018-12-28 MED ORDER — LORAZEPAM 2 MG PO TABS
2.0000 mg | ORAL_TABLET | Freq: Once | ORAL | Status: DC
  Administered 2018-12-28: 2 mg via ORAL

## 2018-12-28 MED ORDER — ENOXAPARIN SODIUM 40 MG/0.4ML ~~LOC~~ SOLN
40.0000 mg | SUBCUTANEOUS | Status: DC
  Administered 2018-12-28: 40 mg via SUBCUTANEOUS
  Filled 2018-12-28: qty 0.4

## 2018-12-28 NOTE — BH Assessment (Signed)
[facility] Assessment Progress Note  Pt has been accepted to [facility].  Pt's nurse, Jan, has been notified.  Jalene Mullet, [place] Coordinator [phone]
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
## 2018-12-28 NOTE — BH Assessment (Signed)
[facility] Assessment Progress Note  With positive test result for Covid-19 for this pt, this writer has explored other treatment alternatives for the pt.  Buford Dresser, DO maintains that pt needs hospitalization, given his reported suicidal ideation and inability to contract for safety.  At 15:26 I called the [facility] and spoke to Marland Kitchen [phone]) to ask if a single case agreement could be implemented for Dale [name] or [employer] funding to a facility with a Covid quarantine unit.  As of this writing, the only known such unit is at [facility], which does not have a contract with [facility] for these payor sources.  He agrees to review the problem with his administration and to call back either to me before 16:30, or to the disposition social worker at [phone] after 16:30.  Return call is pending as of this writing.  At 15:32 I called [facility] and spoke to Brinson.  She reports that even with a payor source, they would be unable to accept a patient with a known positive Covid test to the quarantine unit.  I then called back to Legrand Como at [facility], leaving a voice mail notifying him of this.  I have also called [facility] [phone]) to explore options, asking to speak to the admitting nurse.  They report that the admitting nurses are currently in a staff meeting until 16:30.  I will try to reach out to them later today, or will hand off to the disposition social worker.  Dale [name], [place] Coordinator [phone]
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
## 2018-12-28 NOTE — Progress Notes (Signed)
Pt accepted to [facility] hall. Covid test showed still in process from early morning. Before transfer called [facility] lab and was reported positive result. Reported to MD.

## 2018-12-28 NOTE — Plan of Care (Signed)
[facility] Observation Crisis Plan  Reason for Crisis Plan:  Crisis Stabilization   Plan of Care:  Referral for Inpatient Hospitalization  Family Support:      Current Living Environment:  Living Arrangements: Parent  Insurance:   Hospital Account    Name Acct ID Class Status Primary Coverage   Dale [name], Dale [name] [id number] [place] [facility]/DD/SAS - [place]        Guarantor Account (for Hospital Account [id number])    Name Relation to Pt Service Area Active? Acct Type   Dale [name] Self [facility] Yes [facility]   Address Phone       [address] [place], [place] [address] [phone])          Coverage Information (for Hospital Account [id number])    F/O Payor/Plan Precert #   [place] MH/DD/SAS/[place]-NON [place] #   Dale, [name] [id number]   Address Phone   [address] [place], [place] [address] [phone]      Legal Guardian:     Primary Care Provider:  Patient, No Pcp Per  Current Outpatient Providers:  Dale [name]  Psychiatrist:     Counselor/Therapist:     Compliant with Medications:  Yes  Additional Information:   Dale [name] 9/24/20203:37 AM
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
## 2018-12-28 NOTE — Progress Notes (Signed)
Pt d/c with EMS. White sealed belongings bag and gray backpack given to EMS for transport. Envelope with covid results and chart notes given to EMS for nurse Tamika at [facility].

## 2018-12-28 NOTE — Progress Notes (Signed)
  [place] TEAM 1 - Stepdown/ICU TEAM  Discussed w/ attending MD at [facility]. Pt is a 53yo w/ major depressive disorder w/ psychotic features who was evaluated at [facility] today. Psychiatry felt he needed inpatient psychiatric stabilization. He was incidentally found to be COVID positive, though he does not presently have sx of an active infection.  He has been accepted to a med/surg bed. Psychiatry will continue to follow him daily, and will attend to his ultimate disposition.   Cherene Altes, MD [facility] Office  [phone]

## 2018-12-28 NOTE — Care Management (Signed)
Spoke to [place] at  [facility] [phone]).  Writer was informed that they do accept referrals for patients that are positive for COVID; however, the medical team must approve placement after the referral packet has been received.
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
## 2018-12-28 NOTE — Progress Notes (Signed)
Pt is being transferred to [facility] due to positive covid test. Report called to nurse Ludger Nutting and EMS called for transport.

## 2018-12-28 NOTE — Progress Notes (Signed)
Patient stated he did not know his mother's phone number and there is no one else he would like [place] to notify.

## 2018-12-29 ENCOUNTER — Observation Stay (HOSPITAL_COMMUNITY): Payer: HRSA Program

## 2018-12-29 DIAGNOSIS — Z79899 Other long term (current) drug therapy: Secondary | ICD-10-CM | POA: Diagnosis not present

## 2018-12-29 DIAGNOSIS — K279 Peptic ulcer, site unspecified, unspecified as acute or chronic, without hemorrhage or perforation: Secondary | ICD-10-CM | POA: Diagnosis present

## 2018-12-29 DIAGNOSIS — J1289 Other viral pneumonia: Secondary | ICD-10-CM | POA: Diagnosis present

## 2018-12-29 DIAGNOSIS — F312 Bipolar disorder, current episode manic severe with psychotic features: Secondary | ICD-10-CM

## 2018-12-29 DIAGNOSIS — J1282 Pneumonia due to coronavirus disease 2019: Secondary | ICD-10-CM | POA: Diagnosis present

## 2018-12-29 DIAGNOSIS — Z7984 Long term (current) use of oral hypoglycemic drugs: Secondary | ICD-10-CM | POA: Diagnosis not present

## 2018-12-29 DIAGNOSIS — I8290 Acute embolism and thrombosis of unspecified vein: Secondary | ICD-10-CM

## 2018-12-29 DIAGNOSIS — F17210 Nicotine dependence, cigarettes, uncomplicated: Secondary | ICD-10-CM | POA: Diagnosis present

## 2018-12-29 DIAGNOSIS — F1994 Other psychoactive substance use, unspecified with psychoactive substance-induced mood disorder: Secondary | ICD-10-CM

## 2018-12-29 DIAGNOSIS — N183 Chronic kidney disease, stage 3 (moderate): Secondary | ICD-10-CM | POA: Diagnosis present

## 2018-12-29 DIAGNOSIS — F4310 Post-traumatic stress disorder, unspecified: Secondary | ICD-10-CM | POA: Diagnosis present

## 2018-12-29 DIAGNOSIS — R441 Visual hallucinations: Secondary | ICD-10-CM

## 2018-12-29 DIAGNOSIS — E1122 Type 2 diabetes mellitus with diabetic chronic kidney disease: Secondary | ICD-10-CM | POA: Diagnosis present

## 2018-12-29 DIAGNOSIS — U071 COVID-19: Secondary | ICD-10-CM | POA: Diagnosis present

## 2018-12-29 DIAGNOSIS — R45850 Homicidal ideations: Secondary | ICD-10-CM | POA: Diagnosis present

## 2018-12-29 DIAGNOSIS — Z809 Family history of malignant neoplasm, unspecified: Secondary | ICD-10-CM | POA: Diagnosis not present

## 2018-12-29 DIAGNOSIS — R45851 Suicidal ideations: Secondary | ICD-10-CM

## 2018-12-29 DIAGNOSIS — F1424 Cocaine dependence with cocaine-induced mood disorder: Secondary | ICD-10-CM

## 2018-12-29 DIAGNOSIS — Z7901 Long term (current) use of anticoagulants: Secondary | ICD-10-CM | POA: Diagnosis not present

## 2018-12-29 DIAGNOSIS — Z833 Family history of diabetes mellitus: Secondary | ICD-10-CM | POA: Diagnosis not present

## 2018-12-29 DIAGNOSIS — R440 Auditory hallucinations: Secondary | ICD-10-CM

## 2018-12-29 DIAGNOSIS — I129 Hypertensive chronic kidney disease with stage 1 through stage 4 chronic kidney disease, or unspecified chronic kidney disease: Secondary | ICD-10-CM | POA: Diagnosis present

## 2018-12-29 LAB — COMPREHENSIVE METABOLIC PANEL
ALT: 14 U/L (ref 0–44)
AST: 21 U/L (ref 15–41)
Albumin: 3.3 g/dL — ABNORMAL LOW (ref 3.5–5.0)
Alkaline Phosphatase: 58 U/L (ref 38–126)
Anion gap: 8 (ref 5–15)
BUN: 15 mg/dL (ref 6–20)
CO2: 25 mmol/L (ref 22–32)
Calcium: 8.3 mg/dL — ABNORMAL LOW (ref 8.9–10.3)
Chloride: 108 mmol/L (ref 98–111)
Creatinine, Ser: 1.4 mg/dL — ABNORMAL HIGH (ref 0.61–1.24)
GFR calc Af Amer: >60 mL/min (ref >60)
GFR calc non Af Amer: 57 mL/min — ABNORMAL LOW (ref >60)
Glucose, Bld: 94 mg/dL (ref 70–99)
Potassium: 4.4 mmol/L (ref 3.5–5.1)
Sodium: 141 mmol/L (ref 135–145)
Total Bilirubin: 0.3 mg/dL (ref 0.3–1.2)
Total Protein: 6.2 g/dL — ABNORMAL LOW (ref 6.5–8.1)

## 2018-12-29 LAB — FERRITIN: Ferritin: 47 ng/mL (ref 24–336)

## 2018-12-29 LAB — CBC
HCT: 45.2 % (ref 39.0–52.0)
Hemoglobin: 14.5 g/dL (ref 13.0–17.0)
MCH: 28.9 pg (ref 26.0–34.0)
MCHC: 32.1 g/dL (ref 30.0–36.0)
MCV: 90.2 fL (ref 80.0–100.0)
Platelets: 123 10*3/uL — ABNORMAL LOW (ref 150–400)
RBC: 5.01 MIL/uL (ref 4.22–5.81)
RDW: 13.8 % (ref 11.5–15.5)
WBC: 2.3 10*3/uL — ABNORMAL LOW (ref 4.0–10.5)
nRBC: 0 % (ref 0.0–0.2)

## 2018-12-29 LAB — GLUCOSE, CAPILLARY
Glucose-Capillary: 103 mg/dL — ABNORMAL HIGH (ref 70–99)
Glucose-Capillary: 107 mg/dL — ABNORMAL HIGH (ref 70–99)

## 2018-12-29 LAB — SAMPLE TO BLOOD BANK

## 2018-12-29 LAB — D-DIMER, QUANTITATIVE: D-Dimer, Quant: 1.98 ug/mL-FEU — ABNORMAL HIGH (ref 0.00–0.50)

## 2018-12-29 LAB — C-REACTIVE PROTEIN: CRP: 1.4 mg/dL — ABNORMAL HIGH (ref <1.0)

## 2018-12-29 MED ORDER — OLANZAPINE 5 MG PO TABS
5.0000 mg | ORAL_TABLET | Freq: Every day | ORAL | Status: DC
  Administered 2018-12-30 – 2019-01-01 (×3): 5 mg via ORAL
  Filled 2018-12-29 (×5): qty 1

## 2018-12-29 MED ORDER — SODIUM CHLORIDE 0.9 % IV SOLN
100.0000 mg | INTRAVENOUS | Status: DC
  Administered 2018-12-30 – 2018-12-31 (×2): 100 mg via INTRAVENOUS
  Filled 2018-12-29 (×3): qty 20

## 2018-12-29 MED ORDER — INSULIN ASPART 100 UNIT/ML ~~LOC~~ SOLN
0.0000 [IU] | Freq: Three times a day (TID) | SUBCUTANEOUS | Status: DC
  Administered 2018-12-30 – 2018-12-31 (×2): 1 [IU] via SUBCUTANEOUS
  Administered 2018-12-31: 2 [IU] via SUBCUTANEOUS
  Administered 2019-01-01: 1 [IU] via SUBCUTANEOUS

## 2018-12-29 MED ORDER — APIXABAN 2.5 MG PO TABS
2.5000 mg | ORAL_TABLET | Freq: Two times a day (BID) | ORAL | Status: DC
  Administered 2018-12-29 – 2019-01-01 (×7): 2.5 mg via ORAL
  Filled 2018-12-29 (×10): qty 1

## 2018-12-29 MED ORDER — OLANZAPINE 10 MG PO TABS
10.0000 mg | ORAL_TABLET | Freq: Every day | ORAL | Status: DC
  Administered 2018-12-29 – 2018-12-31 (×3): 10 mg via ORAL
  Filled 2018-12-29 (×4): qty 1

## 2018-12-29 MED ORDER — ACETAMINOPHEN 325 MG PO TABS
650.0000 mg | ORAL_TABLET | Freq: Four times a day (QID) | ORAL | Status: DC | PRN
  Administered 2018-12-29 (×2): 650 mg via ORAL
  Filled 2018-12-29 (×2): qty 2

## 2018-12-29 MED ORDER — DEXAMETHASONE 6 MG PO TABS
6.0000 mg | ORAL_TABLET | Freq: Every day | ORAL | Status: DC
  Administered 2018-12-29 – 2018-12-31 (×3): 6 mg via ORAL
  Filled 2018-12-29 (×3): qty 1

## 2018-12-29 MED ORDER — SODIUM CHLORIDE 0.9 % IV SOLN
200.0000 mg | Freq: Once | INTRAVENOUS | Status: AC
  Administered 2018-12-29: 200 mg via INTRAVENOUS
  Filled 2018-12-29: qty 40

## 2018-12-29 MED ORDER — OXYCODONE HCL 5 MG PO TABS
5.0000 mg | ORAL_TABLET | Freq: Four times a day (QID) | ORAL | Status: DC | PRN
  Administered 2018-12-29 – 2018-12-31 (×5): 5 mg via ORAL
  Filled 2018-12-29 (×5): qty 1

## 2018-12-29 MED ORDER — BENZONATATE 100 MG PO CAPS
200.0000 mg | ORAL_CAPSULE | Freq: Three times a day (TID) | ORAL | Status: DC | PRN
  Administered 2019-01-01: 200 mg via ORAL
  Filled 2018-12-29: qty 2
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
## 2018-12-29 NOTE — Progress Notes (Signed)
PROGRESS NOTE                                                                                                                                                                                                             Patient Demographics:    Dale [name], is a 53 y.o. male, DOB - 03-Jul-1965, URK:[id number]  Outpatient Primary MD for the patient is Patient, No Pcp Per   Admit date - 12/28/2018   LOS - 1  No chief complaint on file.      Brief Narrative: Patient is a 53 y.o. male with PMHx of with history of depression/bipolar disorder-who was brought to psychiatric service for delusions/suicidal ideation-subsequently found to have COVID-19.  He was then transferred to the hospitalist service at [facility].  See below for further details.   Subjective:    Doreen Beam had fever last night.  Has intermittent dry cough.   Assessment  & Plan :    Covid 19 Viral pneumonia: Febrile last night-chest x-ray done this morning shows pneumonia.  We will go and start Decadron and IV Remdesivir.  Fever: febrile last night  O2 requirements: On room air  COVID-19 Labs: Recent Labs    12/28/18 2010  DDIMER 2.08*  CRP 1.5*    Lab Results  Component Value Date   SARSCOV2NAA POSITIVE (A) 12/28/2018   [place] NEGATIVE 10/21/2018     COVID-19 Medications: Steroids: 9/25>> Remdesivir: 9/25>> Actemra: Not indicated Convalescent Plasma: Not indicated at this point Research Studies:N/A  Other medications: Diuretics:Euvolemic-no need for lasix Antibiotics:Not needed as no evidence of bacterial infection  Prone/Incentive Spirometry: Not needed at this point is not hypoxic  DVT Prophylaxis  :  Lovenox   DM-2: Stop metformin-start SSI and monitor CBGs  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  Suicidal ideation/delusions with depression: Surveyor, quantity in place-on Zyprexa.  Psych consult/follow-up requested  ABG:    Component Value Date/Time   PHART 7.380 06/08/2013 0617   PCO2ART 34.5 (L) 06/08/2013 0617   PO2ART 75.3 (L) 06/08/2013 0617   HCO3 19.9 (L) 06/08/2013 0617   TCO2 27 08/25/2013 0119   ACIDBASEDEF 3.9 (H) 06/08/2013 0617   O2SAT 93.7  06/08/2013 0617    Vent Settings: N/A  Condition - Stable  Family Communication  : None at bedside-we will update over the next few days.  Code Status :  Full Code  Diet :  Diet Order            Diet Heart Room service appropriate? Yes; Fluid consistency: Thin  Diet effective now               Disposition Plan  :  Remain hospitalized  Barriers to discharge: Febrile-with pneumonia on chest x-ray-starting IV Remdesivir x5 days.  Consults  : Psychiatry  Procedures  :  None  Antibiotics  :    Anti-infectives (From admission, onward)   None      Inpatient Medications  Scheduled Meds: . dexamethasone  6 mg Oral Daily  . enoxaparin (LOVENOX) injection  40 mg Subcutaneous Q24H  . metFORMIN  500 mg Oral Q breakfast  . OLANZapine  5 mg Oral BID  . vitamin C  500 mg Oral Daily  . zinc sulfate  220 mg Oral Daily   Continuous Infusions: . sodium chloride     PRN Meds:.sodium chloride, acetaminophen, ondansetron **OR** ondansetron (ZOFRAN) IV, sodium chloride flush   Time Spent in minutes  25  See all Orders from today for further details   Jeoffrey Massed M.D on 12/29/2018 at 1:12 PM  To page go to [email or web address] - use universal password  [facility] -  Office  [phone]    Objective:   Vitals:   12/28/18 2200 12/29/18 0400 12/29/18 0515 12/29/18 0752  BP: 134/79 116/72  115/85  Pulse: 72 82  81  Resp: 18   19  Temp: 99.9 F (37.7 C) (!) 102.3 F (39.1 C) (!) 100.4 F (38 C) 98.4 F (36.9 C)  TempSrc:  Oral Oral Oral Oral  SpO2: 99% 97%  99%  Weight:      Height:        Wt Readings from Last 3 Encounters:  12/28/18 76.2 kg  10/02/18 98 kg  03/11/18 83 kg     Intake/Output Summary (Last 24 hours) at 12/29/2018 1312 Last data filed at 12/29/2018 0845 Gross per 24 hour  Intake 360 ml  Output -  Net 360 ml     Physical Exam Gen Exam:Alert awake-not in any distress HEENT:atraumatic, normocephalic Chest: B/L clear to auscultation anteriorly CVS:S1S2 regular Abdomen:soft non tender, non distended Extremities:no edema Neurology: Non focal Skin: no rash   Data Review:    CBC Recent Labs  Lab 12/28/18 2010  WBC 2.1*  HGB 13.9  HCT 44.4  PLT 121*  MCV 90.1  MCH 28.2  MCHC 31.3  RDW 13.6  LYMPHSABS 1.0  MONOABS 0.3  EOSABS 0.0  BASOSABS 0.0    Chemistries  Recent Labs  Lab 12/28/18 2010  NA 141  K 3.9  CL 108  CO2 25  GLUCOSE 109*  BUN 13  CREATININE 1.34*  CALCIUM 8.1*  AST 23  ALT 14  ALKPHOS 61  BILITOT <0.1*   ------------------------------------------------------------------------------------------------------------------ No results for input(s): CHOL, HDL, LDLCALC, TRIG, CHOLHDL, LDLDIRECT in the last 72 hours.  Lab Results  Component Value Date   HGBA1C 5.8 (H) 10/21/2018   ------------------------------------------------------------------------------------------------------------------ No results for input(s): TSH, T4TOTAL, T3FREE, THYROIDAB in the last 72 hours.  Invalid input(s): FREET3 ------------------------------------------------------------------------------------------------------------------ No results for input(s): VITAMINB12, FOLATE, FERRITIN, TIBC, IRON, RETICCTPCT in the last 72 hours.  Coagulation profile No results for input(s): INR, PROTIME in the last 168 hours.  Recent Labs    12/28/18 2010  DDIMER 2.08*    Cardiac Enzymes No results for input(s): CKMB, TROPONINI, MYOGLOBIN in the last 168 hours.  Invalid  input(s): CK ------------------------------------------------------------------------------------------------------------------ No results found for: BNP  Micro Results Recent Results (from the past 240 hour(s))  SARS Coronavirus 2 [facility] order, Performed in [facility] hospital lab) Nasopharyngeal Nasopharyngeal Swab     Status: Abnormal   Collection Time: 12/28/18  2:20 AM   Specimen: Nasopharyngeal Swab  Result Value Ref Range Status   SARS Coronavirus 2 POSITIVE (A) NEGATIVE Final    Comment: RESULT CALLED TO, READ BACK BY AND VERIFIED WITH: WRIGHT,J. RN AT 1148 12/28/18 MULLINS,T (NOTE) If result is NEGATIVE SARS-CoV-2 target nucleic acids are NOT DETECTED. The SARS-CoV-2 RNA is generally detectable in upper and lower  respiratory specimens during the acute phase of infection. The lowest  concentration of SARS-CoV-2 viral copies this assay can detect is 250  copies / mL. A negative result does not preclude SARS-CoV-2 infection  and should not be used as the sole basis for treatment or other  patient management decisions.  A negative result may occur with  improper specimen collection / handling, submission of specimen other  than nasopharyngeal swab, presence of viral mutation(s) within the  areas targeted by this assay, and inadequate number of viral copies  (<250 copies / mL). A negative result must be combined with clinical  observations, patient history, and epidemiological information. If result is POSITIVE SARS-CoV-2 target nucleic acids are DETECTED . The SARS-CoV-2 RNA is generally detectable in upper and lower  respiratory specimens during the acute phase of infection.  Positive  results are indicative of active infection with SARS-CoV-2.  Clinical  correlation with patient history and other diagnostic information is  necessary to determine patient infection status.  Positive results do  not rule out bacterial infection or co-infection with other viruses. If  result is PRESUMPTIVE POSTIVE SARS-CoV-2 nucleic acids MAY BE PRESENT.   A presumptive positive result was obtained on the submitted specimen  and confirmed on repeat testing.  While 2019 novel coronavirus  (SARS-CoV-2) nucleic acids may be present in the submitted sample  additional confirmatory testing may be necessary for epidemiological  and / or clinical management purposes  to differentiate between  SARS-CoV-2 and other Sarbecovirus currently known to infect humans.  If clinically indicated additional testing with an alternate test  methodology [phone] ) is advised. The SARS-CoV-2 RNA is generally  detectable in upper and lower respiratory specimens during the acute  phase of infection. The expected result is Negative. Fact Sheet for Patients:  BoilerBrush.com.cy Fact Sheet for Healthcare Providers: https://pope.com/ This test is not yet approved or cleared by the [place] FDA and has been authorized for detection and/or diagnosis of SARS-CoV-2 by FDA under an Emergency Use Authorization (EUA).  This EUA will remain in effect (meaning this test can be used) for the duration of the COVID-19 declaration under Section 564(b)(1) of the Act, 21 U.S.C. section 360bbb-3(b)(1), unless the authorization is terminated or revoked sooner. Performed at [facility], [address] [place], [place] [address]     Radiology Reports Dg Chest [place] 1v Same Day  Result Date: 12/29/2018 CLINICAL DATA:  Shortness of breath.  COVID-19 positive. EXAM: PORTABLE CHEST 1 VIEW COMPARISON:  CTA chest dated October 02, 2018. Chest x-ray dated March 11, 2018. FINDINGS: The heart size and mediastinal contours are within normal limits. Atherosclerotic calcification of the aortic arch. Normal pulmonary vascularity.  Mild patchy bibasilar opacities. No pleural effusion or pneumothorax. No acute osseous abnormality. IMPRESSION: 1. Mild patchy  bibasilar opacities, consistent with history of COVID-19 pneumonia. Electronically Signed   By: Obie DredgeWilliam T Derry M.D.   On: 12/29/2018 10:12

## 2018-12-29 NOTE — Progress Notes (Signed)
This nurse did not attempt to reach out listed contact on chart.  Previous note by psychiatry mentioned patient refused consent for collection of collateral information from anyone, including mother.  Earleen Reaper RN

## 2018-12-29 NOTE — Progress Notes (Signed)
Remdesivir - Pharmacy Brief Note   O:  ALT: 14 CXR: Mild patchy bibasilar opacities, consistent with history of COVID-19 pneumonia. SpO2: 99% on RA   A/P:  Remdesivir 200 mg once followed by 100 mg daily x 4 days.   Gretta Arab PharmD, BCPS Clinical pharmacist phone 7am- 5pm: [phone] 12/29/2018 2:01 PM

## 2018-12-29 NOTE — Progress Notes (Signed)
[employer]. Phone went to voicemail

## 2018-12-30 DIAGNOSIS — F1424 Cocaine dependence with cocaine-induced mood disorder: Secondary | ICD-10-CM | POA: Diagnosis not present

## 2018-12-30 DIAGNOSIS — U071 COVID-19: Secondary | ICD-10-CM | POA: Diagnosis not present

## 2018-12-30 DIAGNOSIS — J1289 Other viral pneumonia: Secondary | ICD-10-CM | POA: Diagnosis not present

## 2018-12-30 DIAGNOSIS — I8290 Acute embolism and thrombosis of unspecified vein: Secondary | ICD-10-CM | POA: Diagnosis not present

## 2018-12-30 LAB — COMPREHENSIVE METABOLIC PANEL
ALT: 13 U/L (ref 0–44)
AST: 17 U/L (ref 15–41)
Albumin: 3.2 g/dL — ABNORMAL LOW (ref 3.5–5.0)
Alkaline Phosphatase: 60 U/L (ref 38–126)
Anion gap: 8 (ref 5–15)
BUN: 17 mg/dL (ref 6–20)
CO2: 23 mmol/L (ref 22–32)
Calcium: 8.6 mg/dL — ABNORMAL LOW (ref 8.9–10.3)
Chloride: 108 mmol/L (ref 98–111)
Creatinine, Ser: 1.11 mg/dL (ref 0.61–1.24)
GFR calc Af Amer: >60 mL/min (ref >60)
GFR calc non Af Amer: >60 mL/min (ref >60)
Glucose, Bld: 136 mg/dL — ABNORMAL HIGH (ref 70–99)
Potassium: 4.3 mmol/L (ref 3.5–5.1)
Sodium: 139 mmol/L (ref 135–145)
Total Bilirubin: 0.5 mg/dL (ref 0.3–1.2)
Total Protein: 6.2 g/dL — ABNORMAL LOW (ref 6.5–8.1)

## 2018-12-30 LAB — HIV ANTIBODY (ROUTINE TESTING W REFLEX): HIV Screen 4th Generation wRfx: NONREACTIVE

## 2018-12-30 LAB — GLUCOSE, CAPILLARY
Glucose-Capillary: 82 mg/dL (ref 70–99)
Glucose-Capillary: 123 mg/dL — ABNORMAL HIGH (ref 70–99)
Glucose-Capillary: 141 mg/dL — ABNORMAL HIGH (ref 70–99)
Glucose-Capillary: 131 mg/dL — ABNORMAL HIGH (ref 70–99)

## 2018-12-30 LAB — CBC
HCT: 45.1 % (ref 39.0–52.0)
Hemoglobin: 14.2 g/dL (ref 13.0–17.0)
MCH: 28.3 pg (ref 26.0–34.0)
MCHC: 31.5 g/dL (ref 30.0–36.0)
MCV: 89.8 fL (ref 80.0–100.0)
Platelets: 146 10*3/uL — ABNORMAL LOW (ref 150–400)
RBC: 5.02 MIL/uL (ref 4.22–5.81)
RDW: 13.6 % (ref 11.5–15.5)
WBC: 1.4 10*3/uL — CL (ref 4.0–10.5)
nRBC: 0 % (ref 0.0–0.2)

## 2018-12-30 LAB — FERRITIN: Ferritin: 55 ng/mL (ref 24–336)

## 2018-12-30 LAB — D-DIMER, QUANTITATIVE: D-Dimer, Quant: 1.53 ug/mL-FEU — ABNORMAL HIGH (ref 0.00–0.50)

## 2018-12-30 LAB — C-REACTIVE PROTEIN: CRP: 1 mg/dL — ABNORMAL HIGH (ref <1.0)
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
## 2018-12-30 NOTE — Progress Notes (Signed)
PROGRESS NOTE                                                                                                                                                                                                             Patient Demographics:    Cypher Paule, is a 53 y.o. male, DOB - 01/12/1966, EPP:[id number]  Outpatient Primary MD for the patient is Patient, No Pcp Per   Admit date - 12/28/2018   LOS - 2  No chief complaint on file.      Brief Narrative: Patient is a 53 y.o. male with PMHx of with history of depression/bipolar disorder-who was brought to psychiatric service for delusions/suicidal ideation-subsequently found to have COVID-19.  Initially he was thought to be asymptomatic-but upon further evaluation he was found to have fever, cough-chest x-ray demonstrated pneumonia.  He was started on steroids and Remdesivir with good clinical response.  See below for further details.     Subjective:    Jesson Foskey was lying comfortably in bed-he is on room air.  No further fever.   Assessment  & Plan :    Covid 19 Viral pneumonia: Improved-now afebrile-remains on room air-continue Decadron and Remdesivir.  .  Fever: Afebrile  O2 requirements: On room air  COVID-19 Labs: Recent Labs    12/28/18 2010 12/29/18 1404 12/30/18 0341  DDIMER 2.08* 1.98* 1.53*  FERRITIN  --  47 55  CRP 1.5* 1.4* 1.0*    Lab Results  Component Value Date   SARSCOV2NAA POSITIVE (A) 12/28/2018   SARSCOV2NAA NEGATIVE 10/21/2018     COVID-19 Medications: Steroids: 9/25>> Remdesivir: 9/25>> Actemra: Not indicated Convalescent Plasma: Not indicated at this point Research Studies:N/A  Other medications: Diuretics:Euvolemic-no need for lasix Antibiotics:Not needed as no evidence of bacterial infection  Prone/Incentive Spirometry: Not needed at this point is not hypoxic  DVT Prophylaxis  :  Lovenox   DM-2: Stable-continue SSI.   Metformin on hold.    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  Very poor historian-not sure if he has been compliant in the past.  CKD stage III: Creatinine close to usual baseline-follow  History of polysubstance abuse: Numerous prior UDS  positive for cocaine-supportive care and monitor.  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  Psych follow-up appreciated.  ABG:    Component Value Date/Time   PHART 7.380 06/08/2013 0617   PCO2ART 34.5 (L) 06/08/2013 0617   PO2ART 75.3 (L) 06/08/2013 0617   HCO3 19.9 (L) 06/08/2013 0617   TCO2 27 08/25/2013 0119   ACIDBASEDEF 3.9 (H) 06/08/2013 0617   O2SAT 93.7 06/08/2013 0617    Vent Settings: N/A  Condition - Stable  Family Communication  : None at bedside-we will update over the next few days.  Code Status :  Full Code  Diet :  Diet Order            Diet Heart Room service appropriate? Yes; Fluid consistency: Thin  Diet effective now               Disposition Plan  :  Remain hospitalized  Barriers to discharge: Febrile-with pneumonia on chest x-ray-starting IV Remdesivir x5 days.  Consults  : Psychiatry  Procedures  :  None  Antibiotics  :    Anti-infectives (From admission, onward)   Start     Dose/Rate Route Frequency Ordered Stop   12/30/18 1600  remdesivir 100 mg in sodium chloride 0.9 % 250 mL IVPB     100 mg 500 mL/hr over 30 Minutes Intravenous Every 24 hours 12/29/18 1406 01/03/19 1559   12/29/18 1600  remdesivir 200 mg in sodium chloride 0.9 % 250 mL IVPB     200 mg 500 mL/hr over 30 Minutes Intravenous Once 12/29/18 1406 12/29/18 1649      Inpatient Medications  Scheduled Meds: . apixaban  2.5 mg Oral BID  . dexamethasone  6 mg Oral Q breakfast  . insulin aspart  0-9 Units Subcutaneous TID WC  . OLANZapine  10  mg Oral QHS  . OLANZapine  5 mg Oral Daily  . vitamin C  500 mg Oral Daily  . zinc sulfate  220 mg Oral Daily   Continuous Infusions: . sodium chloride    . remdesivir 100 mg in NS 250 mL 100 mg (12/30/18 1627)   PRN Meds:.sodium chloride, acetaminophen, benzonatate, ondansetron **OR** ondansetron (ZOFRAN) IV, oxyCODONE, sodium chloride flush   Time Spent in minutes  25  See all Orders from today for further details   Jeoffrey MassedShanker Jkwon Treptow M.D on 12/30/2018 at 5:54 PM  To page go to [email or web address] - use universal password  [facility] -  Office  [phone]    Objective:   Vitals:   12/29/18 2100 12/30/18 0423 12/30/18 0805 12/30/18 1628  BP: 97/66 90/60 120/86 116/73  Pulse:  70 72 70  Resp:  16 18 18   Temp: 98.3 F (36.8 C) 98.2 F (36.8 C) 98.2 F (36.8 C) 98.5 F (36.9 C)  TempSrc: Oral Oral Oral Oral  SpO2: 93% 99% 99% 97%  Weight:      Height:        Wt Readings from Last 3 Encounters:  12/28/18 76.2 kg  10/21/18 77.1 kg  10/02/18 98 kg     Intake/Output Summary (Last 24 hours) at 12/30/2018 1754 Last data filed at 12/30/2018 1300 Gross per 24 hour  Intake 1680 ml  Output 0 ml  Net 1680 ml     Physical Exam Gen Exam:Alert awake-not in any distress HEENT:atraumatic, normocephalic Chest: B/L clear to auscultation anteriorly CVS:S1S2 regular Abdomen:soft non tender, non distended Extremities:no edema Neurology: Non focal Skin: no rash   Data Review:  CBC Recent Labs  Lab 12/28/18 2010 12/29/18 1404 12/30/18 0341  WBC 2.1* 2.3* 1.4*  HGB 13.9 14.5 14.2  HCT 44.4 45.2 45.1  PLT 121* 123* 146*  MCV 90.1 90.2 89.8  MCH 28.2 28.9 28.3  MCHC 31.3 32.1 31.5  RDW 13.6 13.8 13.6  LYMPHSABS 1.0  --   --   MONOABS 0.3  --   --   EOSABS 0.0  --   --   BASOSABS 0.0  --   --     Chemistries  Recent Labs  Lab 12/28/18 2010 12/29/18 1404 12/30/18 0341  NA 141 141 139  K 3.9 4.4 4.3  CL 108 108 108  CO2 25 25 23   GLUCOSE 109* 94  136*  BUN 13 15 17   CREATININE 1.34* 1.40* 1.11  CALCIUM 8.1* 8.3* 8.6*  AST 23 21 17   ALT 14 14 13   ALKPHOS 61 58 60  BILITOT <0.1* 0.3 0.5   ------------------------------------------------------------------------------------------------------------------ No results for input(s): CHOL, HDL, LDLCALC, TRIG, CHOLHDL, LDLDIRECT in the last 72 hours.  Lab Results  Component Value Date   HGBA1C 5.8 (H) 10/21/2018   ------------------------------------------------------------------------------------------------------------------ No results for input(s): TSH, T4TOTAL, T3FREE, THYROIDAB in the last 72 hours.  Invalid input(s): FREET3 ------------------------------------------------------------------------------------------------------------------ Recent Labs    12/29/18 1404 12/30/18 0341  FERRITIN 47 55    Coagulation profile No results for input(s): INR, PROTIME in the last 168 hours.  Recent Labs    12/29/18 1404 12/30/18 0341  DDIMER 1.98* 1.53*    Cardiac Enzymes No results for input(s): CKMB, TROPONINI, MYOGLOBIN in the last 168 hours.  Invalid input(s): CK ------------------------------------------------------------------------------------------------------------------ No results found for: BNP  Micro Results Recent Results (from the past 240 hour(s))  SARS Coronavirus 2 [facility] order, Performed in [facility] hospital lab) Nasopharyngeal Nasopharyngeal Swab     Status: Abnormal   Collection Time: 12/28/18  2:20 AM   Specimen: Nasopharyngeal Swab  Result Value Ref Range Status   SARS Coronavirus 2 POSITIVE (A) NEGATIVE Final    Comment: RESULT CALLED TO, READ BACK BY AND VERIFIED WITH: WRIGHT,J. RN AT 1148 12/28/18 MULLINS,T (NOTE) If result is NEGATIVE SARS-CoV-2 target nucleic acids are NOT DETECTED. The SARS-CoV-2 RNA is generally detectable in upper and lower  respiratory specimens during the acute phase of infection. The lowest  concentration of  SARS-CoV-2 viral copies this assay can detect is 250  copies / mL. A negative result does not preclude SARS-CoV-2 infection  and should not be used as the sole basis for treatment or other  patient management decisions.  A negative result may occur with  improper specimen collection / handling, submission of specimen other  than nasopharyngeal swab, presence of viral mutation(s) within the  areas targeted by this assay, and inadequate number of viral copies  (<250 copies / mL). A negative result must be combined with clinical  observations, patient history, and epidemiological information. If result is POSITIVE SARS-CoV-2 target nucleic acids are DETECTED . The SARS-CoV-2 RNA is generally detectable in upper and lower  respiratory specimens during the acute phase of infection.  Positive  results are indicative of active infection with SARS-CoV-2.  Clinical  correlation with patient history and other diagnostic information is  necessary to determine patient infection status.  Positive results do  not rule out bacterial infection or co-infection with other viruses. If result is PRESUMPTIVE POSTIVE SARS-CoV-2 nucleic acids MAY BE PRESENT.   A presumptive positive result was obtained on the submitted specimen  and confirmed on  repeat testing.  While 2019 novel coronavirus  (SARS-CoV-2) nucleic acids may be present in the submitted sample  additional confirmatory testing may be necessary for epidemiological  and / or clinical management purposes  to differentiate between  SARS-CoV-2 and other Sarbecovirus currently known to infect humans.  If clinically indicated additional testing with an alternate test  methodology [id number] ) is advised. The SARS-CoV-2 RNA is generally  detectable in upper and lower respiratory specimens during the acute  phase of infection. The expected result is Negative. Fact Sheet for Patients:  StrictlyIdeas.no Fact Sheet for Healthcare  Providers: BankingDealers.co.za This test is not yet approved or cleared by the [place] FDA and has been authorized for detection and/or diagnosis of SARS-CoV-2 by FDA under an Emergency Use Authorization (EUA).  This EUA will remain in effect (meaning this test can be used) for the duration of the COVID-19 declaration under Section 564(b)(1) of the Act, 21 U.S.C. section 360bbb-3(b)(1), unless the authorization is terminated or revoked sooner. Performed at [facility], [place] [address] [place], [place] [address]     Radiology Reports Dg Chest [place] 1v Same Day  Result Date: 12/29/2018 CLINICAL DATA:  Shortness of breath.  COVID-19 positive. EXAM: PORTABLE CHEST 1 VIEW COMPARISON:  CTA chest dated October 02, 2018. Chest x-ray dated March 11, 2018. FINDINGS: The heart size and mediastinal contours are within normal limits. Atherosclerotic calcification of the aortic arch. Normal pulmonary vascularity. Mild patchy bibasilar opacities. No pleural effusion or pneumothorax. No acute osseous abnormality. IMPRESSION: 1. Mild patchy bibasilar opacities, consistent with history of COVID-19 pneumonia. Electronically Signed   By: Titus Dubin M.D.   On: 12/29/2018 10:12

## 2018-12-30 NOTE — Progress Notes (Signed)
   12/30/18 2919  Provider Notification  Provider Name/Title R. Shanon Brow MD  Date Provider Notified 12/30/18  Time Provider Notified [phone]  Notification Type Page  Notification Reason Change in status (WBC 1.4)  Response No new orders   No new orders received.  Earleen Reaper RN

## 2018-12-30 NOTE — Progress Notes (Signed)
Pt declined this RN to call family and updated.

## 2018-12-30 NOTE — TOC Progression Note (Cosign Needed)
Transition of Care [facility]) - Progression Note    Patient Details  Name: Dale [name] MRN: [id number] Date of Birth: 1965-04-06  Transition of Care [facility]) CM/SW Contact  Loletha Grayer Beverely Pace, RN Phone Number: [phone] (working remotely) 12/30/2018, 10:49 AM  Clinical Narrative:  Case manager acknowledged order for medication assistance. Patient's scripts should be sent to [facility] pharmacist to be filled. CM will arrange for telephonic hospital F/U appointment on Monday, 01/01/19, next business day. Continuing to monitor.         Expected Discharge Plan and Services                                                 Social Determinants of Health (SDOH) Interventions    Readmission Risk Interventions No flowsheet data found.

## 2018-12-30 NOTE — Progress Notes (Signed)
Patient requested telephone to contact his parents.  Patient has stated in the past he did not know their phone number.  Patient given iphone to make phone call.

## 2018-12-31 DIAGNOSIS — U071 COVID-19: Secondary | ICD-10-CM | POA: Diagnosis not present

## 2018-12-31 LAB — GLUCOSE, CAPILLARY
Glucose-Capillary: 101 mg/dL — ABNORMAL HIGH (ref 70–99)
Glucose-Capillary: 144 mg/dL — ABNORMAL HIGH (ref 70–99)
Glucose-Capillary: 158 mg/dL — ABNORMAL HIGH (ref 70–99)
Glucose-Capillary: 146 mg/dL — ABNORMAL HIGH (ref 70–99)
Glucose-Capillary: 167 mg/dL — ABNORMAL HIGH (ref 70–99)

## 2018-12-31 LAB — CBC
HCT: 43.7 % (ref 39.0–52.0)
Hemoglobin: 13.9 g/dL (ref 13.0–17.0)
MCH: 28.4 pg (ref 26.0–34.0)
MCHC: 31.8 g/dL (ref 30.0–36.0)
MCV: 89.2 fL (ref 80.0–100.0)
Platelets: 153 10*3/uL (ref 150–400)
RBC: 4.9 MIL/uL (ref 4.22–5.81)
RDW: 13.4 % (ref 11.5–15.5)
WBC: 3.5 10*3/uL — ABNORMAL LOW (ref 4.0–10.5)
nRBC: 0 % (ref 0.0–0.2)

## 2018-12-31 LAB — COMPREHENSIVE METABOLIC PANEL
ALT: 12 U/L (ref 0–44)
AST: 16 U/L (ref 15–41)
Albumin: 3.1 g/dL — ABNORMAL LOW (ref 3.5–5.0)
Alkaline Phosphatase: 58 U/L (ref 38–126)
Anion gap: 7 (ref 5–15)
BUN: 19 mg/dL (ref 6–20)
CO2: 24 mmol/L (ref 22–32)
Calcium: 8.5 mg/dL — ABNORMAL LOW (ref 8.9–10.3)
Chloride: 109 mmol/L (ref 98–111)
Creatinine, Ser: 1.1 mg/dL (ref 0.61–1.24)
GFR calc Af Amer: >60 mL/min (ref >60)
GFR calc non Af Amer: >60 mL/min (ref >60)
Glucose, Bld: 131 mg/dL — ABNORMAL HIGH (ref 70–99)
Potassium: 4.4 mmol/L (ref 3.5–5.1)
Sodium: 140 mmol/L (ref 135–145)
Total Bilirubin: 0.2 mg/dL — ABNORMAL LOW (ref 0.3–1.2)
Total Protein: 6.1 g/dL — ABNORMAL LOW (ref 6.5–8.1)

## 2018-12-31 LAB — D-DIMER, QUANTITATIVE: D-Dimer, Quant: 1.24 ug/mL-FEU — ABNORMAL HIGH (ref 0.00–0.50)

## 2018-12-31 LAB — FERRITIN: Ferritin: 64 ng/mL (ref 24–336)

## 2018-12-31 LAB — C-REACTIVE PROTEIN: CRP: 0.9 mg/dL (ref <1.0)

## 2018-12-31 MED ORDER — SODIUM CHLORIDE 0.9 % IV SOLN
25.0000 mg | Freq: Three times a day (TID) | INTRAVENOUS | Status: DC | PRN
  Administered 2018-12-31 – 2019-01-01 (×3): 25 mg via INTRAVENOUS
  Filled 2018-12-31 (×3): qty 1

## 2018-12-31 MED ORDER — DEXAMETHASONE 4 MG PO TABS
4.0000 mg | ORAL_TABLET | Freq: Every day | ORAL | Status: DC
  Administered 2019-01-01: 4 mg via ORAL
  Filled 2018-12-31: qty 1

## 2018-12-31 NOTE — Progress Notes (Signed)
Pt declined notification of family
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
## 2018-12-31 NOTE — Progress Notes (Signed)
PROGRESS NOTE                                                                                                                                                                                                             Patient Demographics:    Dale [name], is a 53 y.o. male, DOB - 1965-05-08, UDJ:[id number]  Outpatient Primary MD for the patient is Patient, No Pcp Per   Admit date - 12/28/2018   LOS - 3  No chief complaint on file.      Brief Narrative: Patient is a 53 y.o. male with PMHx of with history of depression/bipolar disorder-who was brought to psychiatric service for delusions/suicidal ideation-subsequently found to have COVID-19.  Initially he was thought to be asymptomatic-but upon further evaluation he was found to have fever, cough-chest x-ray demonstrated pneumonia.  He was started on steroids and Remdesivir with good clinical response.  See below for further details.     Subjective:    Patient in bed, appears comfortable, denies any headache, no fever, no chest pain or pressure, no shortness of breath , no abdominal pain. No focal weakness.  He is complaining of nonstop hiccups which started since yesterday.   Assessment  & Plan :    Covid 19 Viral pneumonia: Mild infection with no hypoxia or shortness of breath, currently on steroids and Imdur severe, start tapering steroids, discharge in 1 to 2 days.  Will increase activity, encouraged to sit up in chair use I-S and flutter valve for pulmonary toiletry.  Monitor ambulatory pulse ox.  Stable inflammatory markers.    COVID-19 Labs: Recent Labs    12/29/18 1404 12/30/18 0341 12/31/18 0330  DDIMER 1.98* 1.53* 1.24*  FERRITIN 47 55 64  CRP 1.4* 1.0* 0.9    Lab Results  Component Value Date   SARSCOV2NAA POSITIVE (A) 12/28/2018   [place] NEGATIVE 10/21/2018     COVID-19 Medications: Steroids: 9/25>> Remdesivir: 9/25>> Actemra: Not indicated  Convalescent Plasma: Not indicated at this point Research Studies:N/A   Nonstop hiccups.  PRN Thorazine and monitor.    DM-2: Stable-continue SSI.  Metformin on hold.  Acceptable control outpatient.  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  Very poor historian-not sure if he has been compliant in the past.  CKD stage III: Creatinine close to usual baseline-follow  History of polysubstance abuse: Numerous prior UDS positive for cocaine-supportive care and monitor.  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  Initiate psych's input, cleared by psychiatry for discharge on 12/31/2018, currently not suicidal or homicidal.     Condition - Stable  Family Communication  : None at bedside   Code Status :  Full Code  Diet :  Diet Order            Diet Heart Room service appropriate? Yes; Fluid consistency: Thin  Diet effective now               Disposition Plan  : Once medically stable discharge home, cleared by psychiatry.  Barriers to discharge: Febrile-with pneumonia on chest x-ray-starting IV Remdesivir x5 days.  Consults  : Psychiatry  Procedures  :  None  Antibiotics  :    Anti-infectives (From admission, onward)   Start     Dose/Rate Route Frequency Ordered Stop   12/30/18 1600  remdesivir 100 mg in sodium chloride 0.9 % 250 mL IVPB     100 mg 500 mL/hr over 30 Minutes Intravenous Every 24 hours 12/29/18 1406 01/03/19 1559   12/29/18 1600  remdesivir 200 mg in sodium chloride 0.9 % 250 mL IVPB     200 mg 500 mL/hr over 30 Minutes Intravenous Once 12/29/18 1406 12/29/18 1649      DVT Prophylaxis  : Eliquis  Inpatient Medications  Scheduled Meds: . apixaban  2.5 mg Oral BID  . [START ON 01/01/2019]  dexamethasone  4 mg Oral Q breakfast  . insulin aspart  0-9 Units Subcutaneous TID WC  . OLANZapine  10 mg Oral QHS  . OLANZapine  5 mg Oral Daily  . vitamin C  500 mg Oral Daily  . zinc sulfate  220 mg Oral Daily   Continuous Infusions: . sodium chloride    . chlorproMAZINE (THORAZINE) IV 25 mg (12/31/18 1032)  . remdesivir 100 mg in NS 250 mL 100 mg (12/30/18 1627)   PRN Meds:.sodium chloride, acetaminophen, benzonatate, chlorproMAZINE (THORAZINE) IV, [DISCONTINUED] ondansetron **OR** ondansetron (ZOFRAN) IV, oxyCODONE, sodium chloride flush   Time Spent in minutes  25  See all Orders from today for further details   Susa RaringPrashant Jemmie Rhinehart M.D on 12/31/2018 at 11:29 AM  To page go to [email or web address] - use universal password  [facility] -  Office  [phone]    Objective:   Vitals:   12/31/18 0651 12/31/18 0652 12/31/18 0811 12/31/18 0815  BP:  (!) 94/59 111/78   Pulse: (!) 58  65 65  Resp:   20   Temp:   98.8 F (37.1 C) 98.8 F (37.1 C)  TempSrc:   Oral Oral  SpO2:   96% 96%  Weight:      Height:        Wt Readings from Last 3 Encounters:  12/28/18 76.2 kg  10/02/18 98 kg  03/11/18 83 kg     Intake/Output Summary (Last 24 hours) at 12/31/2018 1129 Last data filed at 12/31/2018 0900 Gross per 24 hour  Intake 480 ml  Output -  Net 480 ml     Physical Exam  Awake Alert, Oriented X 3, No new F.N deficits, irritable affect [place].AT,PERRAL Supple Neck,No JVD, No cervical lymphadenopathy appriciated.  Symmetrical Chest wall movement, Good air movement bilaterally, CTAB RRR,No Gallops, Rubs or new Murmurs, No Parasternal Heave +ve B.Sounds, Abd Soft, No tenderness, No organomegaly appriciated, No rebound - guarding or rigidity. No Cyanosis, Clubbing or edema, No new Rash or bruise    Data Review:    CBC Recent Labs  Lab 12/28/18 2010 12/29/18 1404 12/30/18 0341 12/31/18 0330  WBC 2.1* 2.3* 1.4* 3.5*  HGB 13.9 14.5 14.2 13.9  HCT 44.4 45.2 45.1  43.7  PLT 121* 123* 146* 153  MCV 90.1 90.2 89.8 89.2  MCH 28.2 28.9 28.3 28.4  MCHC 31.3 32.1 31.5 31.8  RDW 13.6 13.8 13.6 13.4  LYMPHSABS 1.0  --   --   --   MONOABS 0.3  --   --   --   EOSABS 0.0  --   --   --   BASOSABS 0.0  --   --   --     Chemistries  Recent Labs  Lab 12/28/18 2010 12/29/18 1404 12/30/18 0341 12/31/18 0330  NA 141 141 139 140  K 3.9 4.4 4.3 4.4  CL 108 108 108 109  CO2 25 25 23 24   GLUCOSE 109* 94 136* 131*  BUN 13 15 17 19   CREATININE 1.34* 1.40* 1.11 1.10  CALCIUM 8.1* 8.3* 8.6* 8.5*  AST 23 21 17 16   ALT 14 14 13 12   ALKPHOS 61 58 60 58  BILITOT <0.1* 0.3 0.5 0.2*   ------------------------------------------------------------------------------------------------------------------ No results for input(s): CHOL, HDL, LDLCALC, TRIG, CHOLHDL, LDLDIRECT in the last 72 hours.  Lab Results  Component Value Date   HGBA1C 5.8 (H) 10/21/2018   ------------------------------------------------------------------------------------------------------------------ No results for input(s): TSH, T4TOTAL, T3FREE, THYROIDAB in the last 72 hours.  Invalid input(s): FREET3 ------------------------------------------------------------------------------------------------------------------ Recent Labs    12/30/18 0341 12/31/18 0330  FERRITIN 55 64    Coagulation profile No results for input(s): INR, PROTIME in the last 168 hours.  Recent Labs    12/30/18 0341 12/31/18 0330  DDIMER 1.53* 1.24*    Cardiac Enzymes No results for input(s): CKMB, TROPONINI, MYOGLOBIN in the last 168 hours.  Invalid input(s): CK ------------------------------------------------------------------------------------------------------------------ No results found for: BNP  Micro Results Recent Results (from the past 240 hour(s))  SARS Coronavirus 2 [facility] order, Performed in [facility] hospital lab) Nasopharyngeal Nasopharyngeal Swab     Status: Abnormal   Collection  Time: 12/28/18  2:20 AM   Specimen: Nasopharyngeal Swab  Result Value Ref Range Status   SARS Coronavirus 2 POSITIVE (A) NEGATIVE Final    Comment: RESULT CALLED TO, READ BACK BY AND VERIFIED WITH: WRIGHT,J. RN AT 1148 12/28/18 MULLINS,T (NOTE) If result is NEGATIVE SARS-CoV-2 target nucleic acids are NOT DETECTED. The SARS-CoV-2 RNA is generally detectable in upper and lower  respiratory specimens during the acute phase of infection. The lowest  concentration of SARS-CoV-2 viral copies this assay can detect is 250  copies / mL. A negative result does not preclude SARS-CoV-2 infection  and should not be used as the sole basis for treatment or other  patient management decisions.  A negative result may occur with  improper specimen collection / handling, submission of specimen other  than nasopharyngeal swab, presence of viral mutation(s) within the  areas targeted by this assay, and inadequate number of viral copies  (<250 copies / mL). A negative result must be combined with clinical  observations, patient history, and epidemiological information. If result is POSITIVE SARS-CoV-2 target nucleic acids are DETECTED . The SARS-CoV-2 RNA is  generally detectable in upper and lower  respiratory specimens during the acute phase of infection.  Positive  results are indicative of active infection with SARS-CoV-2.  Clinical  correlation with patient history and other diagnostic information is  necessary to determine patient infection status.  Positive results do  not rule out bacterial infection or co-infection with other viruses. If result is PRESUMPTIVE POSTIVE SARS-CoV-2 nucleic acids MAY BE PRESENT.   A presumptive positive result was obtained on the submitted specimen  and confirmed on repeat testing.  While 2019 novel coronavirus  (SARS-CoV-2) nucleic acids may be present in the submitted sample  additional confirmatory testing may be necessary for epidemiological  and / or clinical  management purposes  to differentiate between  SARS-CoV-2 and other Sarbecovirus currently known to infect humans.  If clinically indicated additional testing with an alternate test  methodology [phone] ) is advised. The SARS-CoV-2 RNA is generally  detectable in upper and lower respiratory specimens during the acute  phase of infection. The expected result is Negative. Fact Sheet for Patients:  BoilerBrush.com.cy Fact Sheet for Healthcare Providers: https://pope.com/ This test is not yet approved or cleared by the [place] FDA and has been authorized for detection and/or diagnosis of SARS-CoV-2 by FDA under an Emergency Use Authorization (EUA).  This EUA will remain in effect (meaning this test can be used) for the duration of the COVID-19 declaration under Section 564(b)(1) of the Act, 21 U.S.C. section 360bbb-3(b)(1), unless the authorization is terminated or revoked sooner. Performed at [facility], [address], [place], [place] [address]     Radiology Reports Dg Chest [place] 1v Same Day  Result Date: 12/29/2018 CLINICAL DATA:  Shortness of breath.  COVID-19 positive. EXAM: PORTABLE CHEST 1 VIEW COMPARISON:  CTA chest dated October 02, 2018. Chest x-ray dated March 11, 2018. FINDINGS: The heart size and mediastinal contours are within normal limits. Atherosclerotic calcification of the aortic arch. Normal pulmonary vascularity. Mild patchy bibasilar opacities. No pleural effusion or pneumothorax. No acute osseous abnormality. IMPRESSION: 1. Mild patchy bibasilar opacities, consistent with history of COVID-19 pneumonia. Electronically Signed   By: Obie Dredge M.D.   On: 12/29/2018 10:12

## 2018-12-31 NOTE — Progress Notes (Signed)
Room phone provided, patient advised would update family.

## 2019-01-01 DIAGNOSIS — U071 COVID-19: Secondary | ICD-10-CM | POA: Diagnosis not present

## 2019-01-01 LAB — COMPREHENSIVE METABOLIC PANEL
ALT: 20 U/L (ref 0–44)
AST: 20 U/L (ref 15–41)
Albumin: 3.1 g/dL — ABNORMAL LOW (ref 3.5–5.0)
Alkaline Phosphatase: 59 U/L (ref 38–126)
Anion gap: 10 (ref 5–15)
BUN: 18 mg/dL (ref 6–20)
CO2: 22 mmol/L (ref 22–32)
Calcium: 8.7 mg/dL — ABNORMAL LOW (ref 8.9–10.3)
Chloride: 111 mmol/L (ref 98–111)
Creatinine, Ser: 1.02 mg/dL (ref 0.61–1.24)
GFR calc Af Amer: >60 mL/min (ref >60)
GFR calc non Af Amer: >60 mL/min (ref >60)
Glucose, Bld: 96 mg/dL (ref 70–99)
Potassium: 4.1 mmol/L (ref 3.5–5.1)
Sodium: 143 mmol/L (ref 135–145)
Total Bilirubin: 0.4 mg/dL (ref 0.3–1.2)
Total Protein: 5.8 g/dL — ABNORMAL LOW (ref 6.5–8.1)

## 2019-01-01 LAB — GLUCOSE, CAPILLARY
Glucose-Capillary: 90 mg/dL (ref 70–99)
Glucose-Capillary: 145 mg/dL — ABNORMAL HIGH (ref 70–99)
Glucose-Capillary: 136 mg/dL — ABNORMAL HIGH (ref 70–99)

## 2019-01-01 LAB — FERRITIN: Ferritin: 59 ng/mL (ref 24–336)

## 2019-01-01 LAB — D-DIMER, QUANTITATIVE: D-Dimer, Quant: 0.95 ug/mL-FEU — ABNORMAL HIGH (ref 0.00–0.50)

## 2019-01-01 LAB — MAGNESIUM: Magnesium: 2.1 mg/dL (ref 1.7–2.4)

## 2019-01-01 LAB — C-REACTIVE PROTEIN: CRP: 1.3 mg/dL — ABNORMAL HIGH (ref <1.0)

## 2019-01-01 NOTE — Progress Notes (Signed)
Patient (alert and oriented x4) is requesting transportation home. CSW confirmed address: [place]. Patient declined for CSW to notify family of his pending arrival and states, "all I need is a ride".   CSW setting up PTAR. Forms to print to the unit printer.   Percell Locus Elorah Dewing LCSW [phone]

## 2019-01-01 NOTE — Progress Notes (Signed)
CSW spoke with patient on the phone regarding discharge plan. Patient reported that he will be staying with his mother and is trying to get her on the phone. CSW offered to contact her as well and patient refused. CSW requested patient let staff know if anything changes and we need to figure out a new plan.   Dale Locus Alannah Averhart LCSW [phone]

## 2019-01-01 NOTE — Discharge Summary (Signed)
Dale [name] JOI:[id number] DOB: Dec 17, 1965 DOA: 12/28/2018  PCP: Patient, No Pcp Per  Admit date: 12/28/2018  Discharge date: 01/01/2019  Admitted From: Home   Disposition:  Home   Recommendations for Outpatient Follow-up:   Follow up with PCP in 1-2 weeks  PCP Please obtain BMP/CBC, 2 view CXR in 1week,  (see Discharge instructions)   PCP Please follow up on the following pending results:    Home Health: None   Equipment/Devices: None  Consultations: Psych Discharge Condition: Stable    CODE STATUS: Full    Diet Recommendation: Heart Healthy Low Carb  Diet Order            Diet Heart Room service appropriate? Yes; Fluid consistency: Thin  Diet effective now               CC - cough   Brief history of present illness from the day of admission and additional interim summary    Patient is a 53 y.o. male with PMHx of with history of depression/bipolar disorder-who was brought to psychiatric service for delusions/suicidal ideation-subsequently found to have COVID-19.  Initially he was thought to be asymptomatic-but upon further evaluation he was found to have fever, cough-chest x-ray demonstrated pneumonia.  He was started on steroids and Remdesivir with good clinical response.  See below for further details.                                                                   Hospital Course    Covid 19 Viral pneumonia: Mild infection with no hypoxia or shortness of breath, currently on steroids and REMDESIVIR, he finished his Remdisvir course today, inflammatory markers stable he is symptom-free on room air.  Will be discharged home with outpatient PCP follow-up.  COVID-19 Labs  Recent Labs    12/30/18 0341 12/31/18 0330 01/01/19 0330  DDIMER 1.53* 1.24* 0.95*  FERRITIN 55 64 59  CRP 1.0* 0.9 1.3*    Lab Results  Component Value Date   SARSCOV2NAA POSITIVE (A) 12/28/2018   [place] NEGATIVE 10/21/2018     History of VTE: Seems to be on a prophylactic dose of Eliquis-this is being continued.  Continue home dose follow with PCP.  CKD stage III: Creatinine close to usual baseline-follow  History of polysubstance abuse: Numerous prior UDS positive for cocaine, social worker requested to assist him.  Denies any current use.  Suicidal ideation/delusions with depression: Surveyor, quantity in place-on Zyprexa.  Initiate psych's input, cleared by psychiatry for discharge on 12/31/2018, currently not suicidal or homicidal.  Will discharge him on his home dose Zyprexa with outpatient follow-up with his primary psychiatrist and PCP within a week.  Nonstop hiccups.    Which improved after 2 doses of Thorazine.  DM-2: Stable-continue SSI.  Metformin on hold.  Acceptable  control outpatient.  Lab Results  Component Value Date   HGBA1C 5.8 (H) 10/21/2018   CBG (last 3)  Recent Labs    12/31/18 2133 12/31/18 2229 01/01/19 0751  GLUCAP 167* 146* 90     Discharge diagnosis     Principal Problem:   COVID-19 virus infection Active Problems:   Peptic ulcer   HTN (hypertension)   PTSD (post-traumatic stress disorder)   Cocaine dependence with cocaine-induced mood disorder (HCC)   VTE (venous thromboembolism)   Bipolar affective disorder, current episode manic with psychotic symptoms (HCC)   Pneumonia due to COVID-19 virus    Discharge instructions    Discharge Instructions    Discharge instructions   Complete by: As directed    Follow with Primary MD and your psychiatrist in 7 days   Get CBC, CMP, 2 view Chest X ray -  checked next visit within 1 week by Primary MD   Activity: As tolerated with Full fall precautions use walker/cane & assistance as needed  Disposition Home    Diet: Heart Healthy low carbohydrate   Special Instructions: If you have smoked or  chewed Tobacco  in the last 2 yrs please stop smoking, stop any regular Alcohol  and or any Recreational drug use.  On your next visit with your primary care physician please Get Medicines reviewed and adjusted.  Please request your Prim.MD to go over all Hospital Tests and Procedure/Radiological results at the follow up, please get all Hospital records sent to your Prim MD by signing hospital release before you go home.  If you experience worsening of your admission symptoms, develop shortness of breath, life threatening emergency, suicidal or homicidal thoughts you must seek medical attention immediately by calling 911 or calling your MD immediately  if symptoms less severe.  You Must read complete instructions/literature along with all the possible adverse reactions/side effects for all the Medicines you take and that have been prescribed to you. Take any new Medicines after you have completely understood and accpet all the possible adverse reactions/side effects.   Do not drive, operate heavy machinery, perform activities at heights, swimming or participation in water activities or provide baby sitting services if your were admitted for syncope or siezures until you have seen by Primary MD or a Neurologist and advised to do so again.  Do not drive when taking Pain medications.  Do not take more than prescribed Pain, Sleep and Anxiety Medications  Wear Seat belts while driving.   Increase activity slowly   Complete by: As directed       Discharge Medications   Allergies as of 01/01/2019      Reactions   Ibuprofen Hives      Medication List    TAKE these medications   apixaban 2.5 MG Tabs tablet Commonly known as: ELIQUIS Take 1 tablet (2.5 mg total) by mouth 2 (two) times daily.   dicyclomine 20 MG tablet Commonly known as: BENTYL Take 1 tablet (20 mg total) by mouth 3 (three) times daily as needed for spasms.   gabapentin 400 MG capsule Commonly known as: NEURONTIN Take 2  capsules (800 mg total) by mouth 3 (three) times daily.   hydrOXYzine 25 MG tablet Commonly known as: ATARAX/VISTARIL Take 1 tablet (25 mg total) by mouth 3 (three) times daily as needed for anxiety.   metFORMIN 500 MG tablet Commonly known as: GLUCOPHAGE Take 1 tablet (500 mg total) by mouth daily with breakfast.   OLANZapine 2.5 MG tablet Commonly known as:  ZYPREXA Take 5 tablets (12.5 mg total) by mouth at bedtime.   pantoprazole 20 MG tablet Commonly known as: PROTONIX Take 1 tablet (20 mg total) by mouth daily.   prazosin 1 MG capsule Commonly known as: MINIPRESS Take 3 capsules (3 mg total) by mouth at bedtime.   sertraline 50 MG tablet Commonly known as: ZOLOFT Take 1 tablet (50 mg total) by mouth daily.       Follow-up Information    [facility]. Schedule an appointment as soon as possible for a visit in 1 week(s).   Contact information: [address] [place] [address] [phone]          Major procedures and Radiology Reports - PLEASE review detailed and final reports thoroughly  -         Dg Chest [facility] 1v Same Day  Result Date: 12/29/2018 CLINICAL DATA:  Shortness of breath.  COVID-19 positive. EXAM: PORTABLE CHEST 1 VIEW COMPARISON:  CTA chest dated October 02, 2018. Chest x-ray dated March 11, 2018. FINDINGS: The heart size and mediastinal contours are within normal limits. Atherosclerotic calcification of the aortic arch. Normal pulmonary vascularity. Mild patchy bibasilar opacities. No pleural effusion or pneumothorax. No acute osseous abnormality. IMPRESSION: 1. Mild patchy bibasilar opacities, consistent with history of COVID-19 pneumonia. Electronically Signed   By: Obie Dredge M.D.   On: 12/29/2018 10:12    Micro Results     Recent Results (from the past 240 hour(s))  SARS Coronavirus 2 [facility] order, Performed in [facility] hospital lab) Nasopharyngeal Nasopharyngeal Swab      Status: Abnormal   Collection Time: 12/28/18  2:20 AM   Specimen: Nasopharyngeal Swab  Result Value Ref Range Status   SARS Coronavirus 2 POSITIVE (A) NEGATIVE Final    Comment: RESULT CALLED TO, READ BACK BY AND VERIFIED WITH: WRIGHT,J. RN AT 1148 12/28/18 MULLINS,T (NOTE) If result is NEGATIVE SARS-CoV-2 target nucleic acids are NOT DETECTED. The SARS-CoV-2 RNA is generally detectable in upper and lower  respiratory specimens during the acute phase of infection. The lowest  concentration of SARS-CoV-2 viral copies this assay can detect is 250  copies / mL. A negative result does not preclude SARS-CoV-2 infection  and should not be used as the sole basis for treatment or other  patient management decisions.  A negative result may occur with  improper specimen collection / handling, submission of specimen other  than nasopharyngeal swab, presence of viral mutation(s) within the  areas targeted by this assay, and inadequate number of viral copies  (<250 copies / mL). A negative result must be combined with clinical  observations, patient history, and epidemiological information. If result is POSITIVE SARS-CoV-2 target nucleic acids are DETECTED . The SARS-CoV-2 RNA is generally detectable in upper and lower  respiratory specimens during the acute phase of infection.  Positive  results are indicative of active infection with SARS-CoV-2.  Clinical  correlation with patient history and other diagnostic information is  necessary to determine patient infection status.  Positive results do  not rule out bacterial infection or co-infection with other viruses. If result is PRESUMPTIVE POSTIVE SARS-CoV-2 nucleic acids MAY BE PRESENT.   A presumptive positive result was obtained on the submitted specimen  and confirmed on repeat testing.  While 2019 novel coronavirus  (SARS-CoV-2) nucleic acids may be present in the submitted sample  additional confirmatory testing may be necessary for  epidemiological  and / or clinical management purposes  to differentiate  between  SARS-CoV-2 and other Sarbecovirus currently known to infect humans.  If clinically indicated additional testing with an alternate test  methodology [phone]([id number] ) is advised. The SARS-CoV-2 RNA is generally  detectable in upper and lower respiratory specimens during the acute  phase of infection. The expected result is Negative. Fact Sheet for Patients:  BoilerBrush.com.cyhttps://www.fda.gov/media/136312/download Fact Sheet for Healthcare Providers: https://pope.com/https://www.fda.gov/media/136313/download This test is not yet approved or cleared by the [place] FDA and has been authorized for detection and/or diagnosis of SARS-CoV-2 by FDA under an Emergency Use Authorization (EUA).  This EUA will remain in effect (meaning this test can be used) for the duration of the COVID-19 declaration under Section 564(b)(1) of the Act, 21 U.S.C. section 360bbb-3(b)(1), unless the authorization is terminated or revoked sooner. Performed at [facility], [address] [place], [place] [address]     Today   Subjective    Dale AbuShawn [name] today has no headache,no chest abdominal pain,no new weakness tingling or numbness, feels much better wants to go home today.     Objective   Blood pressure 109/80, pulse 74, temperature 98.6 F (37 C), temperature source Oral, resp. rate 20, height 5\' 11"  (1.803 m), weight 76.2 kg, SpO2 98 %.   Intake/Output Summary (Last 24 hours) at 01/01/2019 0933 Last data filed at 01/01/2019 0626 Gross per 24 hour  Intake 145 ml  Output -  Net 145 ml    Exam  Awake Alert, Oriented x 3, No new F.N deficits, Normal affect [place].AT,PERRAL Supple Neck,No JVD, No cervical lymphadenopathy appriciated.  Symmetrical Chest wall movement, Good air movement bilaterally, CTAB RRR,No Gallops,Rubs or new Murmurs, No Parasternal Heave +ve B.Sounds, Abd Soft, Non tender, No organomegaly appriciated, No rebound  -guarding or rigidity. No Cyanosis, Clubbing or edema, No new Rash or bruise   Data Review   CBC w Diff:  Lab Results  Component Value Date   WBC 3.5 (L) 12/31/2018   HGB 13.9 12/31/2018   HCT 43.7 12/31/2018   PLT 153 12/31/2018   LYMPHOPCT 44 12/28/2018   MONOPCT 12 12/28/2018   EOSPCT 1 12/28/2018   BASOPCT 1 12/28/2018    CMP:  Lab Results  Component Value Date   NA 143 01/01/2019   K 4.1 01/01/2019   CL 111 01/01/2019   CO2 22 01/01/2019   BUN 18 01/01/2019   CREATININE 1.02 01/01/2019   CREATININE 1.33 10/16/2012   PROT 5.8 (L) 01/01/2019   ALBUMIN 3.1 (L) 01/01/2019   BILITOT 0.4 01/01/2019   ALKPHOS 59 01/01/2019   AST 20 01/01/2019   ALT 20 01/01/2019  .   Total Time in preparing paper work, data evaluation and todays exam - 35 minutes  Susa RaringPrashant Singh M.D on 01/01/2019 at 9:33 AM  [facility]   Office  [phone]

## 2019-01-01 NOTE — Progress Notes (Signed)
Patient discharged but refused to consent for primary RN, charge nurse and SW to contract his mother/LNOK to update her on his discharge. Patient was unable to find a ride so SW set up [employer] transportation home. Patient states he has his house keys so he is able to get into his home.

## 2019-01-01 NOTE — Discharge Instructions (Signed)
Follow with Primary MD and your psychiatrist in 7 days   Get CBC, CMP, 2 view Chest X ray -  checked next visit within 1 week by Primary MD   Activity: As tolerated with Full fall precautions use walker/cane & assistance as needed  Disposition Home    Diet: Heart Healthy low carbohydrate   Special Instructions: If you have smoked or chewed Tobacco  in the last 2 yrs please stop smoking, stop any regular Alcohol  and or any Recreational drug use.  On your next visit with your primary care physician please Get Medicines reviewed and adjusted.  Please request your Prim.MD to go over all Hospital Tests and Procedure/Radiological results at the follow up, please get all Hospital records sent to your Prim MD by signing hospital release before you go home.  If you experience worsening of your admission symptoms, develop shortness of breath, life threatening emergency, suicidal or homicidal thoughts you must seek medical attention immediately by calling 911 or calling your MD immediately  if symptoms less severe.  You Must read complete instructions/literature along with all the possible adverse reactions/side effects for all the Medicines you take and that have been prescribed to you. Take any new Medicines after you have completely understood and accpet all the possible adverse reactions/side effects.   Do not drive, operate heavy machinery, perform activities at heights, swimming or participation in water activities or provide baby sitting services if your were admitted for syncope or siezures until you have seen by Primary MD or a Neurologist and advised to do so again.  Do not drive when taking Pain medications.  Do not take more than prescribed Pain, Sleep and Anxiety Medications  Wear Seat belts while driving.      Person Under Monitoring Name: Dale [name]  Location: [place] [address]   Infection Prevention Recommendations for Individuals Confirmed to have, or  Being Evaluated for, 2019 Novel Coronavirus (COVID-19) Infection Who Receive Care at Home  Individuals who are confirmed to have, or are being evaluated for, COVID-19 should follow the prevention steps below until a healthcare provider or local or state health department says they can return to normal activities.  Stay home except to get medical care You should restrict activities outside your home, except for getting medical care. Do not go to work, school, or public areas, and do not use public transportation or taxis.  Call ahead before visiting your doctor Before your medical appointment, call the healthcare provider and tell them that you have, or are being evaluated for, COVID-19 infection. This will help the healthcare providers office take steps to keep other people from getting infected. Ask your healthcare provider to call the local or state health department.  Monitor your symptoms Seek prompt medical attention if your illness is worsening (e.g., difficulty breathing). Before going to your medical appointment, call the healthcare provider and tell them that you have, or are being evaluated for, COVID-19 infection. Ask your healthcare provider to call the local or state health department.  Wear a facemask You should wear a facemask that covers your nose and mouth when you are in the same room with other people and when you visit a healthcare provider. People who live with or visit you should also wear a facemask while they are in the same room with you.  Separate yourself from other people in your home As much as possible, you should stay in a different room from other people in your home. Also,  you should use a separate bathroom, if available.  Avoid sharing household items You should not share dishes, drinking glasses, cups, eating utensils, towels, bedding, or other items with other people in your home. After using these items, you should wash them thoroughly with soap  and water.  Cover your coughs and sneezes Cover your mouth and nose with a tissue when you cough or sneeze, or you can cough or sneeze into your sleeve. Throw used tissues in a lined trash can, and immediately wash your hands with soap and water for at least 20 seconds or use an alcohol-based hand rub.  Wash your [employer]hands Wash your hands often and thoroughly with soap and water for at least 20 seconds. You can use an alcohol-based hand sanitizer if soap and water are not available and if your hands are not visibly dirty. Avoid touching your eyes, nose, and mouth with unwashed hands.   Prevention Steps for Caregivers and Household Members of Individuals Confirmed to have, or Being Evaluated for, COVID-19 Infection Being Cared for in the Home  If you live with, or provide care at home for, a person confirmed to have, or being evaluated for, COVID-19 infection please follow these guidelines to prevent infection:  Follow healthcare providers instructions Make sure that you understand and can help the patient follow any healthcare provider instructions for all care.  Provide for the patients basic needs You should help the patient with basic needs in the home and provide support for getting groceries, prescriptions, and other personal needs.  Monitor the patients symptoms If they are getting sicker, call his or her medical provider and tell them that the patient has, or is being evaluated for, COVID-19 infection. This will help the healthcare providers office take steps to keep other people from getting infected. Ask the healthcare provider to call the local or state health department.  Limit the number of people who have contact with the patient  If possible, have only one caregiver for the patient.  Other household members should stay in another home or place of residence. If this is not possible, they should stay  in another room, or be separated from the patient as much as  possible. Use a separate bathroom, if available.  Restrict visitors who do not have an essential need to be in the home.  Keep older adults, very young children, and other sick people away from the patient Keep older adults, very young children, and those who have compromised immune systems or chronic health conditions away from the patient. This includes people with chronic heart, lung, or kidney conditions, diabetes, and cancer.  Ensure good ventilation Make sure that shared spaces in the home have good air flow, such as from an air conditioner or an opened window, weather permitting.  Wash your hands often  Wash your hands often and thoroughly with soap and water for at least 20 seconds. You can use an alcohol based hand sanitizer if soap and water are not available and if your hands are not visibly dirty.  Avoid touching your eyes, nose, and mouth with unwashed hands.  Use disposable paper towels to dry your hands. If not available, use dedicated cloth towels and replace them when they become wet.  Wear a facemask and gloves  Wear a disposable facemask at all times in the room and gloves when you touch or have contact with the patients blood, body fluids, and/or secretions or excretions, such as sweat, saliva, sputum, nasal mucus, vomit, urine, or feces.  Ensure the mask fits over your nose and mouth tightly, and do not touch it during use.  Throw out disposable facemasks and gloves after using them. Do not reuse.  Wash your hands immediately after removing your facemask and gloves.  If your personal clothing becomes contaminated, carefully remove clothing and launder. Wash your hands after handling contaminated clothing.  Place all used disposable facemasks, gloves, and other waste in a lined container before disposing them with other household waste.  Remove gloves and wash your hands immediately after handling these items.  Do not share dishes, glasses, or other household  items with the patient  Avoid sharing household items. You should not share dishes, drinking glasses, cups, eating utensils, towels, bedding, or other items with a patient who is confirmed to have, or being evaluated for, COVID-19 infection.  After the person uses these items, you should wash them thoroughly with soap and water.  Wash laundry thoroughly  Immediately remove and wash clothes or bedding that have blood, body fluids, and/or secretions or excretions, such as sweat, saliva, sputum, nasal mucus, vomit, urine, or feces, on them.  Wear gloves when handling laundry from the patient.  Read and follow directions on labels of laundry or clothing items and detergent. In general, wash and dry with the warmest temperatures recommended on the label.  Clean all areas the individual has used often  Clean all touchable surfaces, such as counters, tabletops, doorknobs, bathroom fixtures, toilets, phones, keyboards, tablets, and bedside tables, every day. Also, clean any surfaces that may have blood, body fluids, and/or secretions or excretions on them.  Wear gloves when cleaning surfaces the patient has come in contact with.  Use a diluted bleach solution (e.g., dilute bleach with 1 part bleach and 10 parts water) or a household disinfectant with a label that says EPA-registered for coronaviruses. To make a bleach solution at home, add 1 tablespoon of bleach to 1 quart (4 cups) of water. For a larger supply, add  cup of bleach to 1 gallon (16 cups) of water.  Read labels of cleaning products and follow recommendations provided on product labels. Labels contain instructions for safe and effective use of the cleaning product including precautions you should take when applying the product, such as wearing gloves or eye protection and making sure you have good ventilation during use of the product.  Remove gloves and wash hands immediately after cleaning.  Monitor yourself for signs and symptoms  of illness Caregivers and household members are considered close contacts, should monitor their health, and will be asked to limit movement outside of the home to the extent possible. Follow the monitoring steps for close contacts listed on the symptom monitoring form.   ? If you have additional questions, contact your local health department or call the epidemiologist on call at [phone] (available 24/7). ? This guidance is subject to change. For the most up-to-date guidance from [facility], please refer to their website: TripMetro.hu

## 2019-01-01 NOTE — Progress Notes (Signed)
Patient discharged to home via [place] transportation, Pt denied   feelings to harm self or others. Discharge instructions review,& copy given to patient  by Fayne Norrie day shift RN. Patient verbalized no needs at this time.

## 2019-01-24 ENCOUNTER — Ambulatory Visit (HOSPITAL_COMMUNITY)
Admission: RE | Admit: 2019-01-24 | Discharge: 2019-01-24 | Disposition: A | Discharge status: Home / Self Care | Payer: No Typology Code available for payment source | Attending: Psychiatry | Admitting: Psychiatry

## 2019-01-24 DIAGNOSIS — G4700 Insomnia, unspecified: Secondary | ICD-10-CM | POA: Insufficient documentation

## 2019-01-24 DIAGNOSIS — R440 Auditory hallucinations: Secondary | ICD-10-CM | POA: Insufficient documentation

## 2019-01-24 DIAGNOSIS — F1420 Cocaine dependence, uncomplicated: Secondary | ICD-10-CM | POA: Diagnosis present

## 2019-01-24 DIAGNOSIS — Z87820 Personal history of traumatic brain injury: Secondary | ICD-10-CM | POA: Insufficient documentation

## 2019-01-24 DIAGNOSIS — R45851 Suicidal ideations: Secondary | ICD-10-CM | POA: Insufficient documentation

## 2019-01-24 DIAGNOSIS — Z886 Allergy status to analgesic agent status: Secondary | ICD-10-CM | POA: Insufficient documentation

## 2019-01-24 DIAGNOSIS — E119 Type 2 diabetes mellitus without complications: Secondary | ICD-10-CM | POA: Insufficient documentation

## 2019-01-24 DIAGNOSIS — I10 Essential (primary) hypertension: Secondary | ICD-10-CM | POA: Insufficient documentation

## 2019-01-24 DIAGNOSIS — F332 Major depressive disorder, recurrent severe without psychotic features: Secondary | ICD-10-CM | POA: Insufficient documentation

## 2019-01-25 ENCOUNTER — Encounter (HOSPITAL_COMMUNITY): Payer: Self-pay | Admitting: Emergency Medicine

## 2019-01-25 ENCOUNTER — Observation Stay (HOSPITAL_COMMUNITY)
Admission: RE | Admit: 2019-01-25 | Discharge: 2019-01-26 | Disposition: A | Discharge status: Home / Self Care | Payer: No Typology Code available for payment source | Attending: Psychiatry | Admitting: Psychiatry

## 2019-01-25 ENCOUNTER — Other Ambulatory Visit: Payer: Self-pay

## 2019-01-25 DIAGNOSIS — Z7901 Long term (current) use of anticoagulants: Secondary | ICD-10-CM | POA: Insufficient documentation

## 2019-01-25 DIAGNOSIS — R45851 Suicidal ideations: Secondary | ICD-10-CM | POA: Insufficient documentation

## 2019-01-25 DIAGNOSIS — G4700 Insomnia, unspecified: Secondary | ICD-10-CM | POA: Insufficient documentation

## 2019-01-25 DIAGNOSIS — E119 Type 2 diabetes mellitus without complications: Secondary | ICD-10-CM | POA: Insufficient documentation

## 2019-01-25 DIAGNOSIS — Z79899 Other long term (current) drug therapy: Secondary | ICD-10-CM | POA: Insufficient documentation

## 2019-01-25 DIAGNOSIS — F1424 Cocaine dependence with cocaine-induced mood disorder: Secondary | ICD-10-CM | POA: Diagnosis present

## 2019-01-25 DIAGNOSIS — I10 Essential (primary) hypertension: Secondary | ICD-10-CM | POA: Insufficient documentation

## 2019-01-25 DIAGNOSIS — Z7984 Long term (current) use of oral hypoglycemic drugs: Secondary | ICD-10-CM | POA: Insufficient documentation

## 2019-01-25 DIAGNOSIS — F319 Bipolar disorder, unspecified: Principal | ICD-10-CM | POA: Insufficient documentation

## 2019-01-25 DIAGNOSIS — Z20828 Contact with and (suspected) exposure to other viral communicable diseases: Secondary | ICD-10-CM | POA: Insufficient documentation

## 2019-01-25 DIAGNOSIS — Z87820 Personal history of traumatic brain injury: Secondary | ICD-10-CM | POA: Insufficient documentation

## 2019-01-25 DIAGNOSIS — F1420 Cocaine dependence, uncomplicated: Secondary | ICD-10-CM | POA: Insufficient documentation

## 2019-01-25 DIAGNOSIS — F17210 Nicotine dependence, cigarettes, uncomplicated: Secondary | ICD-10-CM | POA: Insufficient documentation

## 2019-01-25 DIAGNOSIS — F333 Major depressive disorder, recurrent, severe with psychotic symptoms: Secondary | ICD-10-CM | POA: Diagnosis present

## 2019-01-25 DIAGNOSIS — Z560 Unemployment, unspecified: Secondary | ICD-10-CM | POA: Insufficient documentation

## 2019-01-25 DIAGNOSIS — F4310 Post-traumatic stress disorder, unspecified: Secondary | ICD-10-CM | POA: Insufficient documentation

## 2019-01-25 LAB — RAPID URINE DRUG SCREEN, HOSP PERFORMED
Amphetamines: NOT DETECTED
Barbiturates: NOT DETECTED
Benzodiazepines: NOT DETECTED
Cocaine: POSITIVE — AB
Opiates: NOT DETECTED
Tetrahydrocannabinol: NOT DETECTED

## 2019-01-25 LAB — HEMOGLOBIN A1C
Hgb A1c MFr Bld: 6.1 % — ABNORMAL HIGH (ref 4.8–5.6)
Mean Plasma Glucose: 128.37 mg/dL

## 2019-01-25 LAB — ETHANOL: Alcohol, Ethyl (B): <10 mg/dL (ref <10)

## 2019-01-25 LAB — COMPREHENSIVE METABOLIC PANEL
ALT: 19 U/L (ref 0–44)
AST: 24 U/L (ref 15–41)
Albumin: 3.4 g/dL — ABNORMAL LOW (ref 3.5–5.0)
Alkaline Phosphatase: 64 U/L (ref 38–126)
Anion gap: 9 (ref 5–15)
BUN: 11 mg/dL (ref 6–20)
CO2: 24 mmol/L (ref 22–32)
Calcium: 9.1 mg/dL (ref 8.9–10.3)
Chloride: 108 mmol/L (ref 98–111)
Creatinine, Ser: 1.23 mg/dL (ref 0.61–1.24)
GFR calc Af Amer: >60 mL/min (ref >60)
GFR calc non Af Amer: >60 mL/min (ref >60)
Glucose, Bld: 91 mg/dL (ref 70–99)
Potassium: 4 mmol/L (ref 3.5–5.1)
Sodium: 141 mmol/L (ref 135–145)
Total Bilirubin: 0.6 mg/dL (ref 0.3–1.2)
Total Protein: 6.1 g/dL — ABNORMAL LOW (ref 6.5–8.1)

## 2019-01-25 LAB — URINALYSIS, COMPLETE (UACMP) WITH MICROSCOPIC
Bilirubin Urine: NEGATIVE
Glucose, UA: NEGATIVE mg/dL
Hgb urine dipstick: NEGATIVE
Ketones, ur: NEGATIVE mg/dL
Nitrite: NEGATIVE
Protein, ur: NEGATIVE mg/dL
Specific Gravity, Urine: 1.017 (ref 1.005–1.030)
pH: 8 (ref 5.0–8.0)

## 2019-01-25 LAB — HEPATIC FUNCTION PANEL
ALT: 19 U/L (ref 0–44)
AST: 24 U/L (ref 15–41)
Albumin: 3.4 g/dL — ABNORMAL LOW (ref 3.5–5.0)
Alkaline Phosphatase: 64 U/L (ref 38–126)
Bilirubin, Direct: 0.1 mg/dL (ref 0.0–0.2)
Indirect Bilirubin: 0.5 mg/dL (ref 0.3–0.9)
Total Bilirubin: 0.6 mg/dL (ref 0.3–1.2)
Total Protein: 6.1 g/dL — ABNORMAL LOW (ref 6.5–8.1)

## 2019-01-25 LAB — LIPID PANEL
Cholesterol: 132 mg/dL (ref 0–200)
HDL: 53 mg/dL (ref >40)
LDL Cholesterol: 65 mg/dL (ref 0–99)
Total CHOL/HDL Ratio: 2.5 RATIO
Triglycerides: 71 mg/dL (ref <150)
VLDL: 14 mg/dL (ref 0–40)

## 2019-01-25 LAB — GLUCOSE, CAPILLARY: Glucose-Capillary: 112 mg/dL — ABNORMAL HIGH (ref 70–99)

## 2019-01-25 LAB — SARS CORONAVIRUS 2 BY RT PCR (HOSPITAL ORDER, PERFORMED IN ~~LOC~~ HOSPITAL LAB): SARS Coronavirus 2: NEGATIVE

## 2019-01-25 LAB — MAGNESIUM: Magnesium: 2.1 mg/dL (ref 1.7–2.4)

## 2019-01-25 LAB — TSH: TSH: 1.925 u[IU]/mL (ref 0.350–4.500)

## 2019-01-25 MED ORDER — ACETAMINOPHEN 325 MG PO TABS
650.0000 mg | ORAL_TABLET | Freq: Four times a day (QID) | ORAL | Status: DC | PRN
  Administered 2019-01-25: 650 mg via ORAL
  Filled 2019-01-25: qty 2

## 2019-01-25 MED ORDER — ALUM & MAG HYDROXIDE-SIMETH 200-200-20 MG/5ML PO SUSP: 30.0000 mL | ORAL | Status: DC | PRN

## 2019-01-25 MED ORDER — OLANZAPINE 2.5 MG PO TABS
12.5000 mg | ORAL_TABLET | Freq: Every day | ORAL | Status: DC
  Administered 2019-01-25: 12.5 mg via ORAL
  Filled 2019-01-25: qty 1

## 2019-01-25 MED ORDER — PANTOPRAZOLE SODIUM 20 MG PO TBEC
20.0000 mg | DELAYED_RELEASE_TABLET | Freq: Every day | ORAL | Status: DC
  Administered 2019-01-25 – 2019-01-26 (×2): 20 mg via ORAL
  Filled 2019-01-25 (×2): qty 1

## 2019-01-25 MED ORDER — SERTRALINE HCL 50 MG PO TABS
50.0000 mg | ORAL_TABLET | Freq: Every day | ORAL | Status: DC
  Administered 2019-01-25 – 2019-01-26 (×2): 50 mg via ORAL
  Filled 2019-01-25 (×2): qty 1

## 2019-01-25 MED ORDER — HYDROXYZINE HCL 25 MG PO TABS
25.0000 mg | ORAL_TABLET | Freq: Three times a day (TID) | ORAL | Status: DC | PRN
  Administered 2019-01-25: 25 mg via ORAL
  Filled 2019-01-25: qty 1

## 2019-01-25 MED ORDER — TRAZODONE HCL 50 MG PO TABS: 50.0000 mg | ORAL_TABLET | Freq: Every evening | ORAL | Status: DC | PRN

## 2019-01-25 MED ORDER — METFORMIN HCL 500 MG PO TABS
500.0000 mg | ORAL_TABLET | Freq: Every day | ORAL | Status: DC
  Administered 2019-01-26: 500 mg via ORAL
  Filled 2019-01-25: qty 1

## 2019-01-25 MED ORDER — MAGNESIUM HYDROXIDE 400 MG/5ML PO SUSP: 30.0000 mL | Freq: Every day | ORAL | Status: DC | PRN
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
## 2019-01-25 NOTE — Progress Notes (Signed)
Patient stayed in bed for the majority of the shift.  Guarded and rude upon approach. Irritable and demanding. Patient stated "so am I being transferred or what .Marland Kitchen.? People are so slow here.....".  Patient is eating his meals as served. No sign of distress.

## 2019-01-25 NOTE — BH Assessment (Signed)
[place] Assessment Progress Note  Per Hampton Abbot, MD, this voluntary pt requires psychiatric hospitalization at this time.  The following facilities have been contacted to seek placement for this pt, with results as noted:  Beds available, information sent, decision pending:  [place]:  [place], [place] Coordinator [phone]

## 2019-01-25 NOTE — Plan of Care (Signed)
Dale [name] Observation Crisis Plan  Reason for Crisis Plan:  Crisis Stabilization   Plan of Care:  Referral for Inpatient Hospitalization  Family Support:      Current Living Environment:     Insurance:   Hospital Account    Name Acct ID Class Status Primary Coverage   Dale [name], Dale [name] [id number] [place] Open None        Guarantor Account (for Hospital Account [id number])    Name Relation to Pt Service Area Active? Acct Type   Dale [name] Self [facility] Yes [facility]   Address Phone       [address] [place], [place] [address] [phone])          Coverage Information (for Hospital Account [id number])    Not on file      Legal Guardian:     Primary Care Provider:  Patient, No Pcp Per  Current Outpatient Providers:  Dale Abbot, MD  Psychiatrist:     Counselor/Therapist:     Compliant with Medications:  No  Additional Information:   Dale [name] 10/22/20203:38 AM
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
## 2019-01-25 NOTE — Progress Notes (Signed)
Urine sample collected and placed in the refrigerator.
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
## 2019-01-25 NOTE — BHH Counselor (Signed)
[facility] reviewing 

## 2019-01-26 ENCOUNTER — Inpatient Hospital Stay
Admission: AD | Admit: 2019-01-26 | Discharge: 2019-02-05 | DRG: 885 | Disposition: A | Discharge status: Home / Self Care | Payer: No Typology Code available for payment source | Source: Intra-hospital | Attending: Psychiatry | Admitting: Psychiatry

## 2019-01-26 ENCOUNTER — Other Ambulatory Visit: Payer: Self-pay

## 2019-01-26 DIAGNOSIS — Z7901 Long term (current) use of anticoagulants: Secondary | ICD-10-CM

## 2019-01-26 DIAGNOSIS — K219 Gastro-esophageal reflux disease without esophagitis: Secondary | ICD-10-CM | POA: Diagnosis present

## 2019-01-26 DIAGNOSIS — E119 Type 2 diabetes mellitus without complications: Secondary | ICD-10-CM | POA: Diagnosis present

## 2019-01-26 DIAGNOSIS — Z590 Homelessness: Secondary | ICD-10-CM

## 2019-01-26 DIAGNOSIS — R440 Auditory hallucinations: Secondary | ICD-10-CM | POA: Diagnosis present

## 2019-01-26 DIAGNOSIS — G629 Polyneuropathy, unspecified: Secondary | ICD-10-CM | POA: Diagnosis present

## 2019-01-26 DIAGNOSIS — F17210 Nicotine dependence, cigarettes, uncomplicated: Secondary | ICD-10-CM | POA: Diagnosis present

## 2019-01-26 DIAGNOSIS — F3164 Bipolar disorder, current episode mixed, severe, with psychotic features: Secondary | ICD-10-CM | POA: Diagnosis present

## 2019-01-26 DIAGNOSIS — I10 Essential (primary) hypertension: Secondary | ICD-10-CM | POA: Diagnosis present

## 2019-01-26 DIAGNOSIS — Z7289 Other problems related to lifestyle: Secondary | ICD-10-CM | POA: Diagnosis not present

## 2019-01-26 DIAGNOSIS — G4700 Insomnia, unspecified: Secondary | ICD-10-CM | POA: Diagnosis present

## 2019-01-26 DIAGNOSIS — R45851 Suicidal ideations: Secondary | ICD-10-CM | POA: Diagnosis present

## 2019-01-26 DIAGNOSIS — Z7984 Long term (current) use of oral hypoglycemic drugs: Secondary | ICD-10-CM | POA: Diagnosis not present

## 2019-01-26 LAB — PROLACTIN: Prolactin: 14 ng/mL (ref 4.0–15.2)

## 2019-01-26 MED ORDER — CEPHALEXIN 500 MG PO CAPS
500.0000 mg | ORAL_CAPSULE | Freq: Three times a day (TID) | ORAL | Status: AC
  Administered 2019-01-26 – 2019-02-02 (×21): 500 mg via ORAL
  Filled 2019-01-26 (×20): qty 1

## 2019-01-26 MED ORDER — HYDROXYZINE HCL 25 MG PO TABS
25.0000 mg | ORAL_TABLET | Freq: Three times a day (TID) | ORAL | Status: DC | PRN
  Administered 2019-01-26 – 2019-01-31 (×4): 25 mg via ORAL
  Filled 2019-01-26 (×5): qty 1

## 2019-01-26 MED ORDER — SERTRALINE HCL 25 MG PO TABS: 50.0000 mg | ORAL_TABLET | Freq: Every day | ORAL | Status: DC

## 2019-01-26 MED ORDER — MAGNESIUM HYDROXIDE 400 MG/5ML PO SUSP: 30.0000 mL | Freq: Every day | ORAL | Status: DC | PRN

## 2019-01-26 MED ORDER — OLANZAPINE 5 MG PO TABS
15.0000 mg | ORAL_TABLET | Freq: Every day | ORAL | Status: DC
  Administered 2019-01-26 – 2019-01-30 (×5): 15 mg via ORAL
  Filled 2019-01-26 (×5): qty 1

## 2019-01-26 MED ORDER — ALUM & MAG HYDROXIDE-SIMETH 200-200-20 MG/5ML PO SUSP: 30.0000 mL | ORAL | Status: DC | PRN

## 2019-01-26 MED ORDER — METFORMIN HCL 500 MG PO TABS
500.0000 mg | ORAL_TABLET | Freq: Every day | ORAL | Status: DC
  Administered 2019-01-27 – 2019-02-05 (×10): 500 mg via ORAL
  Filled 2019-01-26 (×10): qty 1

## 2019-01-26 MED ORDER — OXYCODONE-ACETAMINOPHEN 5-325 MG PO TABS
1.0000 | ORAL_TABLET | Freq: Four times a day (QID) | ORAL | Status: DC | PRN
  Administered 2019-01-26 – 2019-02-05 (×22): 1 via ORAL
  Filled 2019-01-26 (×22): qty 1

## 2019-01-26 MED ORDER — ACETAMINOPHEN 325 MG PO TABS: 650.0000 mg | ORAL_TABLET | Freq: Four times a day (QID) | ORAL | Status: DC | PRN

## 2019-01-26 MED ORDER — TRAZODONE HCL 50 MG PO TABS
50.0000 mg | ORAL_TABLET | Freq: Every evening | ORAL | Status: DC | PRN
  Administered 2019-02-01: 50 mg via ORAL
  Filled 2019-01-26 (×2): qty 1

## 2019-01-26 MED ORDER — OLANZAPINE 5 MG PO TABS: 12.5000 mg | ORAL_TABLET | Freq: Every day | ORAL | Status: DC

## 2019-01-26 NOTE — Progress Notes (Signed)
[place] NOVEL CORONAVIRUS (COVID-19) DAILY CHECK-OFF SYMPTOMS - answer yes or no to each - every day NO YES  Have you had a fever in the past 24 hours?  . Fever (Temp > 37.80C / 100F) X   Have you had any of these symptoms in the past 24 hours? . New Cough .  Sore Throat  .  Shortness of Breath .  Difficulty Breathing .  Unexplained Body Aches   X   Have you had any one of these symptoms in the past 24 hours not related to allergies?   . Runny Nose .  Nasal Congestion .  Sneezing   X   If you have had runny nose, nasal congestion, sneezing in the past 24 hours, has it worsened?  X   EXPOSURES - check yes or no X   Have you traveled outside the state in the past 14 days?  X   Have you been in contact with someone with a confirmed diagnosis of COVID-19 or PUI in the past 14 days without wearing appropriate PPE?  X   Have you been living in the same home as a person with confirmed diagnosis of COVID-19 or a PUI (household contact)?    X   Have you been diagnosed with COVID-19?    X              What to do next: Answered NO to all: Answered YES to anything:   Proceed with unit schedule Follow the [facility] Inpatient Flowsheet.   

## 2019-01-26 NOTE — BH Assessment (Signed)
[place] Assessment Progress Note  Per Hampton Abbot, MD, this pt requires psychiatric hospitalization.  Ian Malkin, Counselor reports that pt has been accepted to [facility] by Dr Dwyane Dee to Rm 314.  Pt has signed Voluntary Admission and Consent for Treatment, as well as Consent to Release Information to no one, and signed forms have been faxed to [phone].  Pt's nurse, Nicoletta Dress, has been notified, and agrees to call report to [phone].  Pt is to be transported via [employer].  Pt may be transported any time after 15:00.     Jalene Mullet, [place] Coordinator [phone]

## 2019-01-26 NOTE — BHH Suicide Risk Assessment (Signed)
[facility] Admission Suicide Risk Assessment   Nursing information obtained from:  Patient Demographic factors:  Male Current Mental Status:  Suicidal ideation indicated by patient Loss Factors:  NA Historical Factors:  NA Risk Reduction Factors:  NA  Total Time spent with patient: 1 hour Principal Problem: Bipolar disorder, curr episode mixed, severe, with psychotic features (HCC) Diagnosis:  Principal Problem:   Bipolar disorder, curr episode mixed, severe, with psychotic features (HCC) Active Problems:   HTN (hypertension)  Subjective Data: Patient seen and chart reviewed.  Patient with a history of recurrent severe mood symptoms presented complaining of depression thoughts racing feeling paranoid feeling like he can hear things and having active suicidal ideation.  Not sleeping for days.  Has been off medicines for quite a while.  He minimizes or denies substance abuse.  He does indicate however a plan to be cooperative with inpatient treatment.  Continued Clinical Symptoms:  Alcohol Use Disorder Identification Test Final Score (AUDIT): 0 The "Alcohol Use Disorders Identification Test", Guidelines for Use in Primary Care, Second Edition.  World Science writer [facility]). Score between 0-7:  no or low risk or alcohol related problems. Score between 8-15:  moderate risk of alcohol related problems. Score between 16-19:  high risk of alcohol related problems. Score 20 or above:  warrants further diagnostic evaluation for alcohol dependence and treatment.   CLINICAL FACTORS:   Bipolar Disorder:   Mixed State   Musculoskeletal: Strength & Muscle Tone: within normal limits Gait & Station: normal Patient leans: N/A  Psychiatric Specialty Exam: Physical Exam  Nursing note and vitals reviewed. Constitutional: He appears well-developed and well-nourished.  HENT:  Head: Normocephalic and atraumatic.  Eyes: Pupils are equal, round, and reactive to light. Conjunctivae are normal.  Neck:  Normal range of motion.  Cardiovascular: Regular rhythm and normal heart sounds.  Respiratory: Effort normal. No respiratory distress.  GI: Soft.  Musculoskeletal: Normal range of motion.  Neurological: He is alert.  Skin: Skin is warm and dry.  Psychiatric: His mood appears anxious. His speech is rapid and/or pressured. He is agitated. He is not aggressive. Cognition and memory are impaired. He expresses impulsivity. He exhibits a depressed mood. He expresses suicidal ideation.    Review of Systems  Constitutional: Negative.   HENT: Negative.   Eyes: Negative.   Respiratory: Negative.   Cardiovascular: Negative.   Gastrointestinal: Negative.   Musculoskeletal: Negative.   Skin: Negative.   Neurological: Negative.   Psychiatric/Behavioral: Positive for depression, hallucinations and suicidal ideas. Negative for substance abuse. The patient is nervous/anxious and has insomnia.     Blood pressure (!) 115/47, pulse 68, temperature 98 F (36.7 C), temperature source Oral, resp. rate 18, height 5\' 9"  (1.753 m), weight 75.3 kg, SpO2 98 %.Body mass index is 24.51 kg/m.  General Appearance: Casual  Eye Contact:  Minimal  Speech:  Pressured  Volume:  Increased  Mood:  Dysphoric and Irritable  Affect:  Labile  Thought Process:  Disorganized  Orientation:  Full (Time, Place, and Person)  Thought Content:  Paranoid Ideation, Rumination and Tangential  Suicidal Thoughts:  Yes.  without intent/plan  Homicidal Thoughts:  No  Memory:  Immediate;   Fair Recent;   Fair Remote;   Fair  Judgement:  Impaired  Insight:  Shallow  Psychomotor Activity:  Restlessness  Concentration:  Concentration: Fair  Recall:  of Knowledge:  Fair  Language:  Fair  Akathisia:  No  Handed:  Right  AIMS (if indicated):  Assets:  Desire for Improvement Physical Health Resilience  ADL's:  Impaired  Cognition:  Impaired,  Mild  Sleep:         COGNITIVE FEATURES THAT CONTRIBUTE TO RISK:   Closed-mindedness    SUICIDE RISK:   Mild:  Suicidal ideation of limited frequency, intensity, duration, and specificity.  There are no identifiable plans, no associated intent, mild dysphoria and related symptoms, good self-control (both objective and subjective assessment), few other risk factors, and identifiable protective factors, including available and accessible social support.  PLAN OF CARE: Continue 15-minute checks.  Restart psychiatric medicine.  Engage patient in individual and group therapy.  Ongoing reassessment of suicidality.  Work on establishing a reasonable discharge plan.  I certify that inpatient services furnished can reasonably be expected to improve the patient's condition.   Alethia Berthold, MD 01/26/2019, 6:36 PM

## 2019-01-26 NOTE — BH Assessment (Signed)
Patient has been accepted to [facility].  Accepting physician is Dr. Dwyane Dee. Nurse Practitioner, Marvia Pickles, will enter the admission orders.  Attending Physician will be Dr. Weber Cooks.  Patient has been assigned to room 314, by [place].  Call report to [phone].  Representative/Transfer Coordinator is Dispensing optician Patient pre-admitted by [facility] Patient Access Gust Rung.,)  [facility] Staff Nonah Mattes, [facility]) made aware of acceptance.   Patient bed available anytime after 3:00pm

## 2019-01-26 NOTE — Progress Notes (Signed)
D: Pt A & O X 4. Presents with blunted affect and irritable mood. Speech is logical and clear. Observed to be restless and fidgety with avertive eye contact. Denies SI, HI, AVH and pain at this time. Pt transferred to [facility]) as ordered and was cooperative with care.  A: D/C instructions reviewed with pt including follow up care at [facility]; compliance encouraged. All belongings from assigned lockers returned to pt at time of departure. Scheduled medications given with verbal education and effects monitored. Report called to receiving nurse Junita Push, RN. Safety checks maintained without incident till time of d/c.  R: Pt receptive to care. Compliant with medications when offered. Denies adverse drug reactions when assessed. Verbalized understanding related to d/c instructions. Signed belonging sheet in agreement with items received from lockers. Ambulatory with a steady gait. Appears to be in no physical distress at time of departure.
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
## 2019-01-26 NOTE — Progress Notes (Signed)
Pt could not give me detailed reasons for SI. He endorses depression, anxiety and paranoia. Pt was educated on the care plan and verbalizes understanding. Pt has been treated with an antibiotic and pain medication. Collier Bullock RN

## 2019-01-26 NOTE — Plan of Care (Signed)
Pt rates depression and anxiety both at 10/10. Pt endorses passive SI and HI but no plan or particuar persons. Pt denies AVH currently but say s that he has recently had negative AVH. Marland Kitchen Pt was educated on care plan and verbalizes understanding. Collier Bullock  Problem: Education: Goal: Utilization of techniques to improve thought processes will improve Outcome: Not Progressing Goal: Knowledge of the prescribed therapeutic regimen will improve Outcome: Not Progressing   Problem: Activity: Goal: Interest or engagement in leisure activities will improve Outcome: Not Progressing Goal: Imbalance in normal sleep/wake cycle will improve Outcome: Not Progressing   Problem: Coping: Goal: Will verbalize feelings Outcome: Not Progressing   Problem: Education: Goal: Ability to state activities that reduce stress will improve Outcome: Not Progressing   Problem: Self-Concept: Goal: Level of anxiety will decrease Outcome: Not Progressing Goal: Ability to modify response to factors that promote anxiety will improve Outcome: Not Progressing   Problem: Education: Goal: Ability to make informed decisions regarding treatment will improve Outcome: Not Progressing   Problem: Coping: Goal: Coping ability will improve Outcome: Not Progressing   Problem: Health Behavior/Discharge Planning: Goal: Identification of resources available to assist in meeting health care needs will improve Outcome: Not Progressing   Problem: Medication: Goal: Compliance with prescribed medication regimen will improve Outcome: Not Progressing   Problem: Medication: Goal: Compliance with prescribed medication regimen will improve Outcome: Not Progressing

## 2019-01-27 MED ORDER — APIXABAN 2.5 MG PO TABS
2.5000 mg | ORAL_TABLET | Freq: Two times a day (BID) | ORAL | Status: DC
  Administered 2019-01-27 – 2019-02-05 (×18): 2.5 mg via ORAL
  Filled 2019-01-27 (×20): qty 1

## 2019-01-27 MED ORDER — ENSURE ENLIVE PO LIQD
237.0000 mL | Freq: Two times a day (BID) | ORAL | Status: DC
  Administered 2019-01-27 – 2019-02-05 (×14): 237 mL via ORAL

## 2019-01-27 MED ORDER — METHOCARBAMOL 750 MG PO TABS
750.0000 mg | ORAL_TABLET | Freq: Four times a day (QID) | ORAL | Status: DC | PRN
  Administered 2019-01-27 – 2019-02-04 (×15): 750 mg via ORAL
  Filled 2019-01-27 (×17): qty 1

## 2019-01-27 MED ORDER — GABAPENTIN 400 MG PO CAPS
800.0000 mg | ORAL_CAPSULE | Freq: Three times a day (TID) | ORAL | Status: DC
  Administered 2019-01-27 – 2019-02-05 (×27): 800 mg via ORAL
  Filled 2019-01-27 (×27): qty 2

## 2019-01-27 MED ORDER — PANTOPRAZOLE SODIUM 40 MG PO TBEC
40.0000 mg | DELAYED_RELEASE_TABLET | Freq: Every day | ORAL | Status: DC
  Administered 2019-01-27 – 2019-02-05 (×10): 40 mg via ORAL
  Filled 2019-01-27 (×10): qty 1
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
## 2019-01-27 NOTE — BHH Group Notes (Signed)
LCSW Group Therapy Note  Date/Time:  01/27/2019   1:00PM  Type of Therapy and Topic:  Group Therapy:  Healthy vs Unhealthy Coping Skills  Participation Level:  Active   Description of Group:  The focus of this group was to determine what unhealthy coping techniques typically are used by group members and what healthy coping techniques would be helpful in coping with various problems. Patients were guided in becoming aware of the differences between healthy and unhealthy coping techniques.  Patients were asked to identify 1 unhealthy coping skill they used prior to this hospitalization. Patients were then asked to identify 1-2 healthy coping skills they like to use, and many mentioned listening to music, coloring and taking a hot shower. These were further explored on how to implement them more effectively after discharge.   At the end of group, additional ideas of healthy coping skills were shared in discussion.   Therapeutic Goals 1. Patients learned that coping is what human beings do all day long to deal with various situations in their lives 2. Patients defined and discussed healthy vs unhealthy coping techniques 3. Patients identified their preferred coping techniques and identified whether these were healthy or unhealthy 4. Patients determined 1-2 healthy coping skills they would like to become more familiar with and use more often, and practiced a few meditations 5. Patients provided support and ideas to each other  Summary of Patient Progress: During group, patients defined coping skills and identified the difference between healthy and unhealthy coping skills. Patients were asked to identify the unhealthy coping skills they used that caused them to have to be hospitalized. Patients were then asked to discuss the alternate healthy coping skills that they could use in place of the healthy coping skill whenever they return home.  Patient participated in group discussion. He identified  healthy and unhealthy coping skills. He identified his preferred method of coping and identified if the coping skill was healthy or unhealthy. Patient identified  healthy coping skills he wants to use whenever he discharges.    Therapeutic Modalities Cognitive Behavioral Therapy Motivational Interviewing Solution Focused Therapy Brief Therapy    Netta Neat, MSW, LCSW Clinical Social Work 01/27/2019

## 2019-01-27 NOTE — Plan of Care (Signed)
Patient denies SI/HI/AVH. Reports chronic back pain. Safety is maintained on unit. Patient is adherent with scheduled medication.    Problem: Education: Goal: Utilization of techniques to improve thought processes will improve Outcome: Not Progressing Goal: Knowledge of the prescribed therapeutic regimen will improve Outcome: Progressing   Problem: Activity: Goal: Interest or engagement in leisure activities will improve Outcome: Not Progressing
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
## 2019-01-28 NOTE — BHH Suicide Risk Assessment (Signed)
[facility] INPATIENT:  Family/Significant Other Suicide Prevention Education  Suicide Prevention Education:  Patient Refusal for Family/Significant Other Suicide Prevention Education: The patient Dale [name] has refused to provide written consent for family/significant other to be provided Family/Significant Other Suicide Prevention Education during admission and/or prior to discharge.  Physician notified.  Emmarie Sannes  CUEBAS-COLON 01/28/2019, 12:41 PM

## 2019-01-28 NOTE — Plan of Care (Signed)
D- Patient alert and oriented. Patient presents in a pleasant mood on assessment reporting that he didn't sleep well last night because of "nightmares". Patient also had complaints of chronic back pain, rating his pain level a "10/10". Patient did request medication from this Probation officer. Patient reported signs/symptoms of depression/anxiety on his self-inventory, and reported to this writer that his pain level is making him depressed. Patient denies SI, HI, AVH at this time, stating "no, not right now". Patient's goal for today is "myself", in which he will "talk about my problems" in order to accomplish his goals.  A- Scheduled medications administered to patient, per MD orders. Support and encouragement provided.  Routine safety checks conducted every 15 minutes.  Patient informed to notify staff with problems or concerns.  R- No adverse drug reactions noted. Patient contracts for safety at this time. Patient compliant with medications and treatment plan. Patient receptive, calm, and cooperative. Patient interacts well with others on the unit.  Patient remains safe at this time.  Problem: Education: Goal: Utilization of techniques to improve thought processes will improve Outcome: Progressing Goal: Knowledge of the prescribed therapeutic regimen will improve Outcome: Progressing   Problem: Activity: Goal: Interest or engagement in leisure activities will improve Outcome: Progressing Goal: Imbalance in normal sleep/wake cycle will improve Outcome: Progressing   Problem: Coping: Goal: Will verbalize feelings Outcome: Progressing   Problem: Education: Goal: Ability to state activities that reduce stress will improve Outcome: Progressing   Problem: Self-Concept: Goal: Level of anxiety will decrease Outcome: Progressing Goal: Ability to modify response to factors that promote anxiety will improve Outcome: Progressing   Problem: Education: Goal: Ability to make informed decisions regarding  treatment will improve Outcome: Progressing   Problem: Coping: Goal: Coping ability will improve Outcome: Progressing   Problem: Health Behavior/Discharge Planning: Goal: Identification of resources available to assist in meeting health care needs will improve Outcome: Progressing   Problem: Medication: Goal: Compliance with prescribed medication regimen will improve Outcome: Progressing

## 2019-01-28 NOTE — Plan of Care (Signed)
Patient denies SI/HI/AVH. Reports chronic back pain. Safety is maintained on unit. Patient is adherent with scheduled medication. Patient is sleeping.    Problem: Education: Goal: Utilization of techniques to improve thought processes will improve Outcome: Not Progressing Goal: Knowledge of the prescribed therapeutic regimen will improve Outcome: Progressing   Problem: Activity: Goal: Interest or engagement in leisure activities will improve Outcome: Not Progressing

## 2019-01-28 NOTE — BHH Group Notes (Signed)
LCSW Group Therapy Note 01/28/2019 1:15pm  Type of Therapy and Topic: Group Therapy: Feelings Around Returning Home & Establishing a Supportive Framework and Supporting Oneself When Supports Not Available  Participation Level: Active  Description of Group:  Patients first processed thoughts and feelings about upcoming discharge. These included fears of upcoming changes, lack of change, new living environments, judgements and expectations from others and overall stigma of mental health issues. The group then discussed the definition of a supportive framework, what that looks and feels like, and how do to discern it from an unhealthy non-supportive network. The group identified different types of supports as well as what to do when your family/friends are less than helpful or unavailable  Therapeutic Goals  1. Patient will identify one healthy supportive network that they can use at discharge. 2. Patient will identify one factor of a supportive framework and how to tell it from an unhealthy network. 3. Patient able to identify one coping skill to use when they do not have positive supports from others. 4. Patient will demonstrate ability to communicate their needs through discussion and/or role plays.  Summary of Patient Progress:  Patient reported he is not feeling well. Pt engaged during group session. As patients processed their anxiety about discharge and described healthy supports patient shared he is not ready to be discharge. He stated, "still battling mentally." Patients identified at least one self-care tool they were willing to use after discharge.   Therapeutic Modalities Cognitive Behavioral Therapy Motivational Interviewing   Shoua Ulloa  CUEBAS-COLON, LCSW 01/28/2019 10:23 AM

## 2019-01-29 MED ORDER — CITALOPRAM HYDROBROMIDE 20 MG PO TABS
20.0000 mg | ORAL_TABLET | Freq: Every day | ORAL | Status: DC
  Administered 2019-01-29 – 2019-02-05 (×8): 20 mg via ORAL
  Filled 2019-01-29 (×8): qty 1

## 2019-01-29 MED ORDER — SENNOSIDES-DOCUSATE SODIUM 8.6-50 MG PO TABS
1.0000 | ORAL_TABLET | Freq: Once | ORAL | Status: AC
  Administered 2019-01-29: 1 via ORAL
  Filled 2019-01-29: qty 1

## 2019-01-29 MED ORDER — MAGNESIUM OXIDE 400 (241.3 MG) MG PO TABS
400.0000 mg | ORAL_TABLET | Freq: Every day | ORAL | Status: DC
  Administered 2019-01-29 – 2019-02-05 (×8): 400 mg via ORAL
  Filled 2019-01-29 (×8): qty 1

## 2019-01-29 MED ORDER — DOCUSATE SODIUM 100 MG PO CAPS
100.0000 mg | ORAL_CAPSULE | Freq: Two times a day (BID) | ORAL | Status: DC
  Administered 2019-01-29 – 2019-02-05 (×14): 100 mg via ORAL
  Filled 2019-01-29 (×14): qty 1

## 2019-01-29 NOTE — Plan of Care (Signed)
D- Patient alert and oriented. Patient presented in sad, but pleasant mood on assessment reporting that he slept "fair" last night and continues to endorse lower back pain. Patient rated his pain level a "9/10", and he did request pain medication from staff. Patient endorsed depression/anxiety on his self-inventory, however, he did not elaborate to this writer why he is feeling this way. Patient denied SI, HI, AVH, at this time. Patient's goal for today is "myself", in which he will "talk about my problems" in order to achieve his goal.  A- Scheduled medications administered to patient, per MD orders. Support and encouragement provided.  Routine safety checks conducted every 15 minutes.  Patient informed to notify staff with problems or concerns.  R- No adverse drug reactions noted. Patient contracts for safety at this time. Patient compliant with medications and treatment plan. Patient receptive, calm, and cooperative. Patient interacts well with others on the unit.  Patient remains safe at this time.  Problem: Education: Goal: Utilization of techniques to improve thought processes will improve Outcome: Progressing Goal: Knowledge of the prescribed therapeutic regimen will improve Outcome: Progressing   Problem: Activity: Goal: Interest or engagement in leisure activities will improve Outcome: Progressing Goal: Imbalance in normal sleep/wake cycle will improve Outcome: Progressing   Problem: Coping: Goal: Will verbalize feelings Outcome: Progressing   Problem: Education: Goal: Ability to state activities that reduce stress will improve Outcome: Progressing   Problem: Self-Concept: Goal: Level of anxiety will decrease Outcome: Progressing Goal: Ability to modify response to factors that promote anxiety will improve Outcome: Progressing   Problem: Education: Goal: Ability to make informed decisions regarding treatment will improve Outcome: Progressing   Problem: Coping: Goal:  Coping ability will improve Outcome: Progressing   Problem: Health Behavior/Discharge Planning: Goal: Identification of resources available to assist in meeting health care needs will improve Outcome: Progressing   Problem: Medication: Goal: Compliance with prescribed medication regimen will improve Outcome: Progressing

## 2019-01-29 NOTE — Plan of Care (Signed)
Patient denies SI/HI/AVH. Reports chronic back pain. Safety is maintained on unit. Patient is adherent with scheduled medication. Patient is sleeping. Received PRN Robaxin for back pain. It was effective.   Problem: Education: Goal: Utilization of techniques to improve thought processes will improve Outcome: Not Progressing Goal: Knowledge of the prescribed therapeutic regimen will improve Outcome: Progressing   Problem: Activity: Goal: Interest or engagement in leisure activities will improve Outcome: Not Progressing

## 2019-01-29 NOTE — BHH Group Notes (Signed)
Overcoming Obstacles  01/29/2019 1PM  Type of Therapy and Topic:  Group Therapy:  Overcoming Obstacles  Participation Level:  Active    Description of Group:    In this group patients will be encouraged to explore what they see as obstacles to their own wellness and recovery. They will be guided to discuss their thoughts, feelings, and behaviors related to these obstacles. The group will process together ways to cope with barriers, with attention given to specific choices patients can make. Each patient will be challenged to identify changes they are motivated to make in order to overcome their obstacles. This group will be process-oriented, with patients participating in exploration of their own experiences as well as giving and receiving support and challenge from other group members.   Therapeutic Goals: 1. Patient will identify personal and current obstacles as they relate to admission. 2. Patient will identify barriers that currently interfere with their wellness or overcoming obstacles.  3. Patient will identify feelings, thought process and behaviors related to these barriers. 4. Patient will identify two changes they are willing to make to overcome these obstacles:      Summary of Patient Progress Pt actively and appropriately participated in todays meeting. Pt identified substance use as a challenge he is working to overcome. Pt discussed with group physiological changes that occur in his body when he is stressed. Pt discussed with group drug use as his "bad habit" when he is stressed. Pt given worksheet on 10 ways to develop resilience.    Therapeutic Modalities:   Cognitive Behavioral Therapy Solution Focused Therapy Motivational Interviewing Relapse Prevention Therapy    Sanjuana Kava, MSW, LCSW 01/29/2019 2:04 PM

## 2019-01-29 NOTE — Progress Notes (Signed)
Recreation Therapy Notes  INPATIENT RECREATION THERAPY ASSESSMENT  Patient Details Name: Dale [name] MRN: [id number] DOB: 04-05-1966 Today's Date: 01/29/2019       Information Obtained From: Patient  Able to Participate in Assessment/Interview: Yes  Patient Presentation: Responsive  Reason for Admission (Per Patient): Active Symptoms, Suicidal Ideation  Patient Stressors:    Coping Skills:   Education officer, community, Other (Comment)(Cleaning)  Leisure Interests (2+):  Engineer, production)  Frequency of Recreation/Participation: Monthly  Awareness of Community Resources:     [employer]:     Current Use:    If no, Barriers?:    Expressed Interest in [place] of Residence:  [place]  Patient Main Form of Transportation: Diplomatic Services operational officer  Patient Strengths:  Neat  Patient Identified Areas of Improvement:  Focusin more  Patient Goal for Hospitalization:  Get back to normal  Current SI (including self-harm):  No  Current HI:  No  Current AVH: No  Staff Intervention Plan: Group Attendance, Collaborate with Interdisciplinary Treatment Team  Consent to Intern Participation: N/A  Oreoluwa Gilmer 01/29/2019, 3:49 PM

## 2019-01-29 NOTE — Progress Notes (Signed)
This Probation officer noticed that patient endorsed SI on his self-inventory, so this Probation officer went to ask patient if he was still feeling this way and he stated "no, that was earlier". This Probation officer asked patient if these thoughts change would he come to staff and let us know and he verbalized that he would.

## 2019-01-29 NOTE — Progress Notes (Signed)
D - Patient was in the day room upon arrival to the unit. Patient was pleasant during assessment denying SI/HI/AVH. Patient endorses anxiety and depression, rating them 5/10. Patient also has chronic pain and rated it 7/10, it went down to 4/10 after medication administration  A - Patient compliant with medication administration per MD orders and procedures on the unit. Patient given education. Patient given support and encouragement to be active in his treatment plan. Patient informed to let staff know if there are any issues or problems on the unit.   R - Patient being monitored Q 15 minutes for safety per unit protocol. Patient remains safe on the unit

## 2019-01-29 NOTE — Progress Notes (Signed)
Patient stated to this writer that no sooner than he went outside, he started having suicidal thoughts again. Patient also stated that the voices are telling him to "just go ahead and kill myself". Patient stated that he feels safe here and will come talk to staff if anything changes.

## 2019-01-29 NOTE — Plan of Care (Signed)
Patient observed interacting with peers and staff appropriately this evening   Problem: Activity: Goal: Interest or engagement in leisure activities will improve Outcome: Progressing
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
## 2019-01-29 NOTE — Progress Notes (Signed)
Recreation Therapy Notes  Date: 01/29/2019  Time: 9:30 am   Location: Craft room   Behavioral response: N/A   Intervention Topic: Happiness  Discussion/Intervention: Patient did not attend group.   Clinical Observations/Feedback:  Patient did not attend group.   Sailor Haughn LRT/CTRS        Westyn Driggers 01/29/2019 11:05 AM

## 2019-01-30 NOTE — Plan of Care (Signed)
Patient knowledgeable of information   received  regarding unit programing  able  to verbalize understanding . Marland Kitchen  Aware of medication received Mental and emotional status improving. Patient working on coping , decision making and anxiety.  Voice no concerns around sleep.  No anger outburst , able to control behavior.  Problem: Coping: Goal: Coping ability will improve Outcome: Progressing   Problem: Education: Goal: Ability to make informed decisions regarding treatment will improve Outcome: Progressing   Problem: Self-Concept: Goal: Level of anxiety will decrease Outcome: Progressing Goal: Ability to modify response to factors that promote anxiety will improve Outcome: Progressing   Problem: Education: Goal: Ability to state activities that reduce stress will improve Outcome: Progressing   Problem: Coping: Goal: Will verbalize feelings Outcome: Progressing   Problem: Activity: Goal: Interest or engagement in leisure activities will improve Outcome: Progressing Goal: Imbalance in normal sleep/wake cycle will improve Outcome: Progressing   Problem: Education: Goal: Utilization of techniques to improve thought processes will improve Outcome: Progressing Goal: Knowledge of the prescribed therapeutic regimen will improve Outcome: Progressing

## 2019-01-30 NOTE — Progress Notes (Signed)
D: Patient stated slept good last night .Stated appetite is good and energy level  Is normal. Stated concentration is good . Stated on Depression scale , hopeless and anxiety .( low 0-10 high) Denies suicidal  homicidal ideations  .  No auditory hallucinations  No pain concerns . Appropriate ADL'S. Interacting with peers and staff. Patient knowledgeable of information   received  regarding unit programing  able  to verbalize understanding . Marland Kitchen  Aware of medication received Mental and emotional status improving. Patient working on coping , decision making and anxiety.  Voice no concerns around sleep.  No anger outburst , able to control behavior.  A: Encourage patient participation with unit programming . Instruction  Given on  Medication , verbalize understanding.  R: Voice no other concerns. Staff continue to monitor

## 2019-01-30 NOTE — [facility] Group Notes (Signed)
  LCSW Group Therapy Note  01/30/2019 10:52 AM   Type of Therapy/Topic:  Group Therapy:  Feelings about Diagnosis  Participation Level:  Did Not Attend   Description of Group:   This group will allow patients to explore their thoughts and feelings about diagnoses they have received. Patients will be guided to explore their level of understanding and acceptance of these diagnoses. Facilitator will encourage patients to process their thoughts and feelings about the reactions of others to their diagnosis and will guide patients in identifying ways to discuss their diagnosis with significant others in their lives. This group will be process-oriented, with patients participating in exploration of their own experiences, giving and receiving support, and processing challenge from other group members.   Therapeutic Goals: 1. Patient will demonstrate understanding of diagnosis as evidenced by identifying two or more symptoms of the disorder 2. Patient will be able to express two feelings regarding the diagnosis 3. Patient will demonstrate their ability to communicate their needs through discussion and/or role play  Summary of Patient Progress: x   Therapeutic Modalities:   Cognitive Behavioral Therapy Brief Therapy Feelings Identification    Lakynn Halvorsen, MSW, LCSW Clinical Social Work 01/30/2019 10:52 AM    

## 2019-01-30 NOTE — Progress Notes (Signed)
Recreation Therapy Notes   Date: 01/30/2019  Time: 9:30 am  Location: Craft room  Behavioral response: Appropriate   Intervention Topic: Goals  Discussion/Intervention:  Group content on today was focused on goals. Patients described what goals are and how they define goals. Individuals expressed how they go about setting goals and reaching them. The group identified how important goals are and if they make short term goals to reach long term goals. Patients described how many goals they work on at a time and what affects them not reaching their goal. Individuals described how much time they put into planning and obtaining their goals. The group participated in the intervention "My Goal Board" and made personal goal boards to help them achieve their goal. Clinical Observations/Feedback:  Patient came to group and defined goals as the mind set to accomplish things. He stated that short term goals are stepping stones to long term goals. Participant expressed that goals keep him productive in life.  Individual was social with peers and staff while participating in group.  Nylia Gavina LRT/CTRS         Jencarlo Bonadonna 01/30/2019 11:18 AM

## 2019-01-30 NOTE — Tx Team (Addendum)
Interdisciplinary Treatment and Diagnostic Plan Update  01/30/2019 Time of Session: 900am Dale [name] MRN: [id number]  Principal Diagnosis: Bipolar disorder, curr episode mixed, severe, with psychotic features ([place])  Secondary Diagnoses: Principal Problem:   Bipolar disorder, curr episode mixed, severe, with psychotic features ([name]) Active Problems:   HTN (hypertension)   Current Medications:  Current Facility-Administered Medications  Medication Dose Route Frequency Provider Last Rate Last Dose  . acetaminophen (TYLENOL) tablet 650 mg  650 mg Oral Q6H PRN Money, Lowry Ram, FNP      . alum & mag hydroxide-simeth (MAALOX/MYLANTA) 200-200-20 MG/5ML suspension 30 mL  30 mL Oral Q4H PRN Money, Lowry Ram, FNP      . apixaban (ELIQUIS) tablet 2.5 mg  2.5 mg Oral BID Clapacs, [name], MD   2.5 mg at 01/30/19 0824  . cephALEXin (KEFLEX) capsule 500 mg  500 mg Oral Q8H Clapacs, [name], MD   500 mg at 01/30/19 0603  . citalopram (CELEXA) tablet 20 mg  20 mg Oral Daily Clapacs, [name], MD   20 mg at 01/30/19 0824  . docusate sodium (COLACE) capsule 100 mg  100 mg Oral BID Clapacs, [name], MD   100 mg at 01/30/19 0824  . feeding supplement (ENSURE ENLIVE) (ENSURE ENLIVE) liquid 237 mL  237 mL Oral BID BM Clapacs, [name], MD   237 mL at 01/29/19 1721  . gabapentin (NEURONTIN) capsule 800 mg  800 mg Oral TID Clapacs, [name], MD   800 mg at 01/30/19 5449  . hydrOXYzine (ATARAX/VISTARIL) tablet 25 mg  25 mg Oral TID PRN Money, Lowry Ram, FNP   25 mg at 01/26/19 2135  . magnesium hydroxide (MILK OF MAGNESIA) suspension 30 mL  30 mL Oral Daily PRN Money, Darnelle Maffucci B, FNP      . magnesium oxide (MAG-OX) tablet 400 mg  400 mg Oral Daily Clapacs, [name], MD   400 mg at 01/30/19 0825  . metFORMIN (GLUCOPHAGE) tablet 500 mg  500 mg Oral Q breakfast Money, Lowry Ram, FNP   500 mg at 01/30/19 2010  . methocarbamol (ROBAXIN) tablet 750 mg  750 mg Oral Q6H PRN Clapacs, [name], MD   750 mg at 01/29/19 2141  . OLANZapine  (ZYPREXA) tablet 15 mg  15 mg Oral QHS Clapacs, [name], MD   15 mg at 01/29/19 2141  . oxyCODONE-acetaminophen (PERCOCET/ROXICET) 5-325 MG per tablet 1 tablet  1 tablet Oral Q6H PRN Clapacs, [name], MD   1 tablet at 01/30/19 0603  . pantoprazole (PROTONIX) EC tablet 40 mg  40 mg Oral Daily Clapacs, [name], MD   40 mg at 01/30/19 0824  . traZODone (DESYREL) tablet 50 mg  50 mg Oral QHS PRN Money, Lowry Ram, FNP       PTA Medications: Medications Prior to Admission  Medication Sig Dispense Refill Last Dose  . metFORMIN (GLUCOPHAGE) 500 MG tablet Take 1 tablet (500 mg total) by mouth daily with breakfast. (Patient not taking: Reported on 01/25/2019) 30 tablet 0     Patient Stressors:    Patient Strengths:    Treatment Modalities: Medication Management, Group therapy, Case management,  1 to 1 session with clinician, Psychoeducation, Recreational therapy.   Physician Treatment Plan for Primary Diagnosis: Bipolar disorder, curr episode mixed, severe, with psychotic features ([place]) Long Term Goal(s): Improvement in symptoms so as ready for discharge Improvement in symptoms so as ready for discharge   Short Term Goals: Ability to verbalize feelings will improve Ability  to disclose and discuss suicidal ideas Ability to demonstrate self-control will improve Compliance with prescribed medications will improve Ability to identify triggers associated with substance abuse/mental health issues will improve  Medication Management: Evaluate patient's response, side effects, and tolerance of medication regimen.  Therapeutic Interventions: 1 to 1 sessions, Unit Group sessions and Medication administration.  Evaluation of Outcomes: Not Met  Physician Treatment Plan for Secondary Diagnosis: Principal Problem:   Bipolar disorder, curr episode mixed, severe, with psychotic features ([place]) Active Problems:   HTN (hypertension)  Long Term Goal(s): Improvement in symptoms so as ready for  discharge Improvement in symptoms so as ready for discharge   Short Term Goals: Ability to verbalize feelings will improve Ability to disclose and discuss suicidal ideas Ability to demonstrate self-control will improve Compliance with prescribed medications will improve Ability to identify triggers associated with substance abuse/mental health issues will improve     Medication Management: Evaluate patient's response, side effects, and tolerance of medication regimen.  Therapeutic Interventions: 1 to 1 sessions, Unit Group sessions and Medication administration.  Evaluation of Outcomes: Not Met   RN Treatment Plan for Primary Diagnosis: Bipolar disorder, curr episode mixed, severe, with psychotic features ([name]) Long Term Goal(s): Knowledge of disease and therapeutic regimen to maintain health will improve  Short Term Goals: Ability to verbalize feelings will improve and Ability to identify and develop effective coping behaviors will improve  Medication Management: RN will administer medications as ordered by provider, will assess and evaluate patient's response and provide education to patient for prescribed medication. RN will report any adverse and/or side effects to prescribing provider.  Therapeutic Interventions: 1 on 1 counseling sessions, Psychoeducation, Medication administration, Evaluate responses to treatment, Monitor vital signs and CBGs as ordered, Perform/monitor CIWA, COWS, AIMS and Fall Risk screenings as ordered, Perform wound care treatments as ordered.  Evaluation of Outcomes: Not Met   LCSW Treatment Plan for Primary Diagnosis: Bipolar disorder, curr episode mixed, severe, with psychotic features ([name]) Long Term Goal(s): Safe transition to appropriate next level of care at discharge, Engage patient in therapeutic group addressing interpersonal concerns.  Short Term Goals: Engage patient in aftercare planning with referrals and resources and Increase skills for  wellness and recovery  Therapeutic Interventions: Assess for all discharge needs, 1 to 1 time with Social worker, Explore available resources and support systems, Assess for adequacy in community support network, Educate family and significant other(s) on suicide prevention, Complete Psychosocial Assessment, Interpersonal group therapy.  Evaluation of Outcomes: Not Met   Progress in Treatment: Attending groups: Yes. Participating in groups: Yes. Taking medication as prescribed: Yes. Toleration medication: Yes. Family/Significant other contact made: No, will contact:  pt declined collateral contact Patient understands diagnosis: Yes. Discussing patient identified problems/goals with staff: Yes. Medical problems stabilized or resolved: Yes. Denies suicidal/homicidal ideation: No. Issues/concerns per patient self-inventory: No. Other: N/A  New problem(s) identified: No, Describe:  none  New Short Term/Long Term Goal(s): Detox, elimination of AVH/symptoms of psychosis, medication management for mood stabilization; elimination of SI thoughts; development of comprehensive mental wellness/sobriety plan.   Patient Goals:  " To get back and be normal and stay on my medicines"  Discharge Plan or Barriers: SPE pamphlet, Mobile Crisis information, and AA/NA information provided to patient for additional community support and resources. CSW assessing for appropriate referrals.   Reason for Continuation of Hospitalization: Anxiety Depression Medication stabilization Suicidal ideation  Estimated Length of Stay: 5-7 days   Recreational Therapy: Patient: N/A Patient Goal: Patient will engage in groups  without prompting or encouragement from LRT x3 group sessions within 5 recreation therapy group sessions  Attendees: Patient: Bernie Ransford 01/30/2019 10:20 AM  Physician: Dr Weber Cooks MD 01/30/2019 10:20 AM  Nursing: Lyda Kalata RN 01/30/2019 10:20 AM  RN Care Manager: 01/30/2019 10:20  AM  Social Worker: Minette Brine Moton LCSW 01/30/2019 10:20 AM  Recreational Therapist: Roanna Epley CTRS LRT 01/30/2019 10:20 AM  Other: Sanjuana Kava LCSW  01/30/2019 10:20 AM  Other:  01/30/2019 10:20 AM  Other: 01/30/2019 10:20 AM    Scribe for Treatment Team: Mariann Laster Moton, LCSW 01/30/2019 10:20 AM
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
## 2019-01-31 LAB — CBC
HCT: 42.3 % (ref 39.0–52.0)
Hemoglobin: 13.4 g/dL (ref 13.0–17.0)
MCH: 28.8 pg (ref 26.0–34.0)
MCHC: 31.7 g/dL (ref 30.0–36.0)
MCV: 90.8 fL (ref 80.0–100.0)
Platelets: 195 10*3/uL (ref 150–400)
RBC: 4.66 MIL/uL (ref 4.22–5.81)
RDW: 14.3 % (ref 11.5–15.5)
WBC: 4.4 10*3/uL (ref 4.0–10.5)
nRBC: 0 % (ref 0.0–0.2)

## 2019-01-31 LAB — CREATININE, SERUM
Creatinine, Ser: 1.13 mg/dL (ref 0.61–1.24)
GFR calc Af Amer: >60 mL/min (ref >60)
GFR calc non Af Amer: >60 mL/min (ref >60)

## 2019-01-31 MED ORDER — PRAZOSIN HCL 2 MG PO CAPS
2.0000 mg | ORAL_CAPSULE | Freq: Every day | ORAL | Status: DC
  Administered 2019-01-31 – 2019-02-04 (×5): 2 mg via ORAL
  Filled 2019-01-31 (×5): qty 1

## 2019-01-31 MED ORDER — OLANZAPINE 10 MG PO TABS
20.0000 mg | ORAL_TABLET | Freq: Every day | ORAL | Status: DC
  Administered 2019-01-31 – 2019-02-03 (×4): 20 mg via ORAL
  Filled 2019-01-31 (×5): qty 2

## 2019-01-31 NOTE — [facility] Group Notes (Signed)
LCSW Group Therapy Note  01/31/2019 1:00 PM  Type of Therapy/Topic:  Group Therapy:  Emotion Regulation  Participation Level:  Did Not Attend   Description of Group:   The purpose of this group is to assist patients in learning to regulate negative emotions and experience positive emotions. Patients will be guided to discuss ways in which they have been vulnerable to their negative emotions. These vulnerabilities will be juxtaposed with experiences of positive emotions or situations, and patients will be challenged to use positive emotions to combat negative ones. Special emphasis will be placed on coping with negative emotions in conflict situations, and patients will process healthy conflict resolution skills.  Therapeutic Goals: 1. Patient will identify two positive emotions or experiences to reflect on in order to balance out negative emotions 2. Patient will label two or more emotions that they find the most difficult to experience 3. Patient will demonstrate positive conflict resolution skills through discussion and/or role plays  Summary of Patient Progress: X  Therapeutic Modalities:   Cognitive Behavioral Therapy Feelings Identification Dialectical Behavioral Therapy  Bridie Colquhoun, MSW, LCSW 01/31/2019 10:32 AM 

## 2019-01-31 NOTE — Progress Notes (Signed)
Patient alert and oriented x 4, affect is flat and sad, he appears irritable earlier in the shift, his thoughts are organized and coherent  no distress noted, he clo of body pain earlier in the shift and was medicated as needed, he was complaint with medication regimen, interacting appropriately with peers and staff. 15 minutes safety checks maintained will continue to monitor.

## 2019-01-31 NOTE — Progress Notes (Signed)
Pt attended groups today. Pt has been social, cooperative and calm . Pt has received prn medication for back pain and one for anxiety this afternoon. He complained of hearing voices in his room.Collier Bullock RN

## 2019-01-31 NOTE — Plan of Care (Signed)
Pt rates anxiety and hopelessness both at 8/10. Pt rates depression 10/10. Pt was educated on care plan and verbalizes understanding. Pt was encouraged to attend groups. Collier Bullock RN Problem: Education: Goal: Utilization of techniques to improve thought processes will improve Outcome: Progressing Goal: Knowledge of the prescribed therapeutic regimen will improve Outcome: Progressing   Problem: Activity: Goal: Interest or engagement in leisure activities will improve Outcome: Progressing Goal: Imbalance in normal sleep/wake cycle will improve Outcome: Progressing   Problem: Coping: Goal: Will verbalize feelings Outcome: Progressing   Problem: Education: Goal: Ability to state activities that reduce stress will improve Outcome: Progressing   Problem: Self-Concept: Goal: Level of anxiety will decrease Outcome: Progressing Goal: Ability to modify response to factors that promote anxiety will improve Outcome: Progressing   Problem: Education: Goal: Ability to make informed decisions regarding treatment will improve Outcome: Progressing   Problem: Coping: Goal: Coping ability will improve Outcome: Progressing   Problem: Health Behavior/Discharge Planning: Goal: Identification of resources available to assist in meeting health care needs will improve Outcome: Progressing   Problem: Medication: Goal: Compliance with prescribed medication regimen will improve Outcome: Progressing   Problem: Medication: Goal: Compliance with prescribed medication regimen will improve Outcome: Progressing
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
## 2019-01-31 NOTE — Progress Notes (Signed)
Recreation Therapy Notes  Date: 01/31/2019  Time: 9:30 am  Location: Craft room  Behavioral response: Appropriate   Intervention Topic: Coping-Skills  Discussion/Intervention:  Group content on today was focused on coping skills. The group defined what coping skills are and when they can be used. Individuals described how they normally cope with thing and the coping skills they normally use. Patients expressed why it is important to cope with things and how not coping with things can affect you. The group participated in the intervention "My coping box" and made coping boxes while adding coping skills they could use in the future to the box. Clinical Observations/Feedback:  Patient came to group and explained that he listens to music and meditates as his coping skills. He expressed that coping skills are important to identify situations and problems. Individual was social with peers and staff while participating in group.  Dale [name] LRT/CTRS          Dale [name] 01/31/2019 11:07 AM
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
## 2019-02-01 NOTE — Progress Notes (Signed)
Recreation Therapy Notes  Date: 02/01/2019  Time: 9:30 am  Location: Craft room  Behavioral response: Appropriate   Intervention Topic: Leisure  Discussion/Intervention:  Group content today was focused on leisure. The group defined what leisure is and some positive leisure activities they participate in. Individuals identified the difference between good and bad leisure. Participants expressed how they feel after participating in the leisure of their choice. The group discussed how they go about picking a leisure activity and if others are involved in their leisure activities. The patient stated how many leisure activities they too choose from and reasons why it is important to have leisure time. Individuals participated in the intervention "Exploration of Leisure" where they had a chance to identify new leisure activities as well as benefits of leisure. Clinical Observations/Feedback:  Patient came to group and explained that he decides what kind of leisure to do based off his mood and his schedule.  Individual was social with peers and staff while participating in the intervention.    Marshal Schrecengost LRT/CTRS         Mellany Dinsmore 02/01/2019 11:50 AM

## 2019-02-01 NOTE — Plan of Care (Signed)
  Problem: Education: Goal: Utilization of techniques to improve thought processes will improve Outcome: Progressing Goal: Knowledge of the prescribed therapeutic regimen will improve Outcome: Progressing   Problem: Activity: Goal: Interest or engagement in leisure activities will improve Outcome: Progressing Goal: Imbalance in normal sleep/wake cycle will improve Outcome: Progressing   Problem: Coping: Goal: Will verbalize feelings Outcome: Progressing   Problem: Education: Goal: Ability to state activities that reduce stress will improve Outcome: Progressing   Problem: Self-Concept: Goal: Level of anxiety will decrease Outcome: Progressing Goal: Ability to modify response to factors that promote anxiety will improve Outcome: Progressing   Problem: Education: Goal: Ability to make informed decisions regarding treatment will improve Outcome: Progressing   Problem: Coping: Goal: Coping ability will improve Outcome: Progressing   Problem: Health Behavior/Discharge Planning: Goal: Identification of resources available to assist in meeting health care needs will improve Outcome: Progressing   Problem: Medication: Goal: Compliance with prescribed medication regimen will improve Outcome: Progressing   

## 2019-02-01 NOTE — Plan of Care (Signed)
  Problem: Education: Goal: Ability to state activities that reduce stress will improve Outcome: Progressing  Patient interacting in the dayroom with peers and he appears less anxious.

## 2019-02-01 NOTE — Plan of Care (Signed)
Pt rates depression 8/10, anxiety 6/10 and hopelessness 10/10.  Pt was educated on care plan and verbalizes understanding. Collier Bullock RN Problem: Education: Goal: Utilization of techniques to improve thought processes will improve Outcome: Progressing Goal: Knowledge of the prescribed therapeutic regimen will improve Outcome: Progressing   Problem: Activity: Goal: Interest or engagement in leisure activities will improve Outcome: Progressing Goal: Imbalance in normal sleep/wake cycle will improve Outcome: Progressing   Problem: Coping: Goal: Will verbalize feelings Outcome: Progressing   Problem: Education: Goal: Ability to state activities that reduce stress will improve Outcome: Progressing   Problem: Self-Concept: Goal: Level of anxiety will decrease Outcome: Not Progressing Goal: Ability to modify response to factors that promote anxiety will improve Outcome: Progressing   Problem: Education: Goal: Ability to make informed decisions regarding treatment will improve Outcome: Progressing   Problem: Coping: Goal: Coping ability will improve Outcome: Progressing   Problem: Health Behavior/Discharge Planning: Goal: Identification of resources available to assist in meeting health care needs will improve Outcome: Progressing   Problem: Medication: Goal: Compliance with prescribed medication regimen will improve Outcome: Progressing

## 2019-02-01 NOTE — [facility] Group Notes (Signed)
LCSW Group Therapy Note  02/01/2019 2:04 PM  Type of Therapy/Topic:  Group Therapy:  Balance in Life  Participation Level:  Active  Description of Group:    This group will address the concept of balance and how it feels and looks when one is unbalanced. Patients will be encouraged to process areas in their lives that are out of balance and identify reasons for remaining unbalanced. Facilitators will guide patients in utilizing problem-solving interventions to address and correct the stressor making their life unbalanced. Understanding and applying boundaries will be explored and addressed for obtaining and maintaining a balanced life. Patients will be encouraged to explore ways to assertively make their unbalanced needs known to significant others in their lives, using other group members and facilitator for support and feedback.  Therapeutic Goals: 1. Patient will identify two or more emotions or situations they have that consume much of in their lives. 2. Patient will identify signs/triggers that life has become out of balance:  3. Patient will identify two ways to set boundaries in order to achieve balance in their lives:  4. Patient will demonstrate ability to communicate their needs through discussion and/or role plays  Summary of Patient Progress: Pt was appropriate and respectful in group. Pt was able to identify areas in his life that he needs to improve. Pt reported that change is constant in life and that you have to learn how to deal with it. Pt reported that you are in control of your own destiny and no one else is. Pt discussed his home life, family, and spirituality are areas that he is doing well in and does not really need any improvement. Pt shared a story about playing baseball in the past and you have to have the mindset that you're going to hit the ball and you have to actually swing the bat or you're not even playing.      Therapeutic Modalities:   Cognitive Behavioral  Therapy Solution-Focused Therapy Assertiveness Training  Evalina Field, MSW, LCSW Clinical Social Work 02/01/2019 2:04 PM

## 2019-02-01 NOTE — Progress Notes (Signed)
Pt has been calm and cooperative. He has asked for his PRN pain meds all throughout the day. He has attended groups and went outside. Collier Bullock RN

## 2019-02-01 NOTE — Progress Notes (Signed)
Patient is participating in groups and socializing adequately with peers with out any issues , patient is complying with her medication regimen denies any SI/HI/AVH , resting comfortable , and voice no concerns , only requiring 15 minutes safety checks, no distress.

## 2019-02-01 NOTE — Progress Notes (Signed)
Patient alert and oriented x 4, affect is flat but he brightens upon approach , he appears less anxious, his thoughts are organized and coherent  no distress noted, he clo of body pain in the beginning of the shift and was medicated as needed, he was complaint with medication regimen, interacting appropriately with peers and staff. 15 minutes safety checks maintained will continue to monitor

## 2019-02-02 MED ORDER — HYDROXYZINE HCL 50 MG PO TABS: 50.0000 mg | ORAL_TABLET | Freq: Every evening | ORAL | 0 refills | Status: DC | PRN

## 2019-02-02 MED ORDER — GABAPENTIN 400 MG PO CAPS: 800.0000 mg | ORAL_CAPSULE | Freq: Three times a day (TID) | ORAL | 0 refills | Status: DC

## 2019-02-02 MED ORDER — APIXABAN 2.5 MG PO TABS: 2.5000 mg | ORAL_TABLET | Freq: Two times a day (BID) | ORAL | 0 refills | Status: DC

## 2019-02-02 MED ORDER — PANTOPRAZOLE SODIUM 40 MG PO TBEC: 40.0000 mg | DELAYED_RELEASE_TABLET | Freq: Every day | ORAL | 0 refills | Status: DC

## 2019-02-02 MED ORDER — OLANZAPINE 20 MG PO TABS: 20.0000 mg | ORAL_TABLET | Freq: Every day | ORAL | 0 refills | Status: DC

## 2019-02-02 MED ORDER — METFORMIN HCL 500 MG PO TABS: 500.0000 mg | ORAL_TABLET | Freq: Every day | ORAL | 0 refills | Status: DC

## 2019-02-02 MED ORDER — CITALOPRAM HYDROBROMIDE 20 MG PO TABS: 20.0000 mg | ORAL_TABLET | Freq: Every day | ORAL | 0 refills | Status: DC

## 2019-02-02 MED ORDER — MAGNESIUM OXIDE 400 (241.3 MG) MG PO TABS: 400.0000 mg | ORAL_TABLET | Freq: Every day | ORAL | 0 refills | Status: DC

## 2019-02-02 MED ORDER — HYDROXYZINE HCL 25 MG PO TABS: 25.0000 mg | ORAL_TABLET | Freq: Three times a day (TID) | ORAL | 0 refills | Status: DC | PRN

## 2019-02-02 MED ORDER — PRAZOSIN HCL 2 MG PO CAPS: 2.0000 mg | ORAL_CAPSULE | Freq: Every day | ORAL | 0 refills | Status: DC

## 2019-02-02 MED ORDER — HYDROXYZINE HCL 50 MG PO TABS
50.0000 mg | ORAL_TABLET | Freq: Every evening | ORAL | Status: DC | PRN
  Administered 2019-02-02 – 2019-02-04 (×3): 50 mg via ORAL
  Filled 2019-02-02 (×3): qty 1

## 2019-02-02 MED ORDER — DOCUSATE SODIUM 100 MG PO CAPS: 100.0000 mg | ORAL_CAPSULE | Freq: Two times a day (BID) | ORAL | 0 refills | Status: DC

## 2019-02-02 NOTE — Plan of Care (Signed)
Pt rates depression 8/10, anxiety 6/10 and hopelessness 7/10.  Pt was educated on care plan and verbalizes understanding. Collier Bullock RN Problem: Education: Goal: Utilization of techniques to improve thought processes will improve Outcome: Progressing Goal: Knowledge of the prescribed therapeutic regimen will improve Outcome: Progressing   Problem: Activity: Goal: Interest or engagement in leisure activities will improve Outcome: Progressing Goal: Imbalance in normal sleep/wake cycle will improve Outcome: Progressing   Problem: Coping: Goal: Will verbalize feelings Outcome: Progressing   Problem: Education: Goal: Ability to state activities that reduce stress will improve Outcome: Not Progressing   Problem: Self-Concept: Goal: Level of anxiety will decrease Outcome: Not Progressing Goal: Ability to modify response to factors that promote anxiety will improve Outcome: Progressing   Problem: Education: Goal: Ability to make informed decisions regarding treatment will improve Outcome: Progressing   Problem: Coping: Goal: Coping ability will improve Outcome: Progressing   Problem: Health Behavior/Discharge Planning: Goal: Identification of resources available to assist in meeting health care needs will improve Outcome: Progressing   Problem: Medication: Goal: Compliance with prescribed medication regimen will improve Outcome: Progressing

## 2019-02-02 NOTE — Progress Notes (Signed)
Recreation Therapy Notes    Date: 02/02/2019  Time: 9:30 am   Location: Craft room   Behavioral response: N/A   Intervention Topic: Communication   Discussion/Intervention: Patient did not attend group.   Clinical Observations/Feedback:  Patient did not attend group.   Dale [name] LRT/CTRS         Dale [name] 02/02/2019 10:40 AM 

## 2019-02-02 NOTE — [facility] Group Notes (Signed)
LCSW Group Therapy Note   02/02/2019 1:00 PM  Type of Therapy and Topic:  Group Therapy:  Overcoming Obstacles   Participation Level:  None   Description of Group:    In this group patients will be encouraged to explore what they see as obstacles to their own wellness and recovery. They will be guided to discuss their thoughts, feelings, and behaviors related to these obstacles. The group will process together ways to cope with barriers, with attention given to specific choices patients can make. Each patient will be challenged to identify changes they are motivated to make in order to overcome their obstacles. This group will be process-oriented, with patients participating in exploration of their own experiences as well as giving and receiving support and challenge from other group members.   Therapeutic Goals: 1. Patient will identify personal and current obstacles as they relate to admission. 2. Patient will identify barriers that currently interfere with their wellness or overcoming obstacles.  3. Patient will identify feelings, thought process and behaviors related to these barriers. 4. Patient will identify two changes they are willing to make to overcome these obstacles:      Summary of Patient Progress Patient slept throughout group.    Therapeutic Modalities:   Cognitive Behavioral Therapy Solution Focused Therapy Motivational Interviewing Relapse Prevention Therapy  Assunta Curtis, MSW, LCSW 02/02/2019 12:52 PM
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
## 2019-02-02 NOTE — [facility] Counselor (Signed)
CSW attempted to call [facility] Sessions to check on status of scheduling the patient with [facility] as requested.  CSW left HIPAA compliant voicemail.   Assunta Curtis, MSW, LCSW 02/02/2019 3:13 PM

## 2019-02-02 NOTE — Progress Notes (Signed)
Pt complains of being "moody" today because of his "sleep medicine". Pt has attended groups and was cooperative. Collier Bullock RN

## 2019-02-03 NOTE — [facility] Group Notes (Signed)
LCSW Aftercare Discharge Planning Group Note   02/03/2019 1300  Type of Group and Topic: Psychoeducational Group:  Discharge Planning  Participation Level:  Active  Description of Group  Discharge planning group reviews patient's anticipated discharge plans and assists patients to anticipate and address any barriers to wellness/recovery in the community.  Suicide prevention education is reviewed with patients in group.  Therapeutic Goals 1. Patients will state their anticipated discharge plan and mental health aftercare 2. Patients will identify potential barriers to wellness in the community setting 3. Patients will engage in problem solving, solution focused discussion of ways to anticipate and address barriers to wellness/recovery  Summary of Patient Progress: Pt active during some parts of group and then appeared to fall asleep towards the end.  Pt made several good comments during discussion about wellness, social supports, and scenarios that could take place after discharge.     Plan for Discharge/Comments:    Transportation Means:   Supports:  Therapeutic Modalities: Motivational Interviewing    Joanne Chars, LCSW 02/03/2019 2:23 PM

## 2019-02-03 NOTE — Plan of Care (Signed)
  Problem: Education: Goal: Utilization of techniques to improve thought processes will improve Outcome: Progressing Goal: Knowledge of the prescribed therapeutic regimen will improve Outcome: Progressing   Problem: Activity: Goal: Interest or engagement in leisure activities will improve Outcome: Progressing Goal: Imbalance in normal sleep/wake cycle will improve Outcome: Progressing   Problem: Coping: Goal: Will verbalize feelings Outcome: Progressing   Problem: Education: Goal: Ability to state activities that reduce stress will improve Outcome: Progressing   Problem: Self-Concept: Goal: Level of anxiety will decrease Outcome: Progressing Goal: Ability to modify response to factors that promote anxiety will improve Outcome: Progressing   Problem: Education: Goal: Ability to make informed decisions regarding treatment will improve Outcome: Progressing   Problem: Coping: Goal: Coping ability will improve Outcome: Progressing   Problem: Health Behavior/Discharge Planning: Goal: Identification of resources available to assist in meeting health care needs will improve Outcome: Progressing   Problem: Medication: Goal: Compliance with prescribed medication regimen will improve Outcome: Progressing

## 2019-02-03 NOTE — Plan of Care (Signed)
Patient Denies SI/HI/AVH. Patient has complaint of chronic back pain 7/10 for all of today. Is only mildly relieved by PRN medication. Patient is adherent to scheduled medication. Patient's safety is maintained on the unit.    Problem: Education: Goal: Utilization of techniques to improve thought processes will improve Outcome: Progressing Goal: Knowledge of the prescribed therapeutic regimen will improve Outcome: Progressing   Problem: Activity: Goal: Interest or engagement in leisure activities will improve Outcome: Progressing
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
## 2019-02-03 NOTE — Progress Notes (Signed)
Patient appear calm , and responding appropriately, reports feeling much better , he denies feeling depressed , anxious , denies SI/HI/AVH, denies expressing any hallucination and does not express any delusions , only expressing chronic pains, percocet is prescribed PRN., teaching and support is provided , patient acknowledge information provided , only requiring 15 minutes safety checks no distress.

## 2019-02-03 NOTE — Progress Notes (Signed)
Patient alert and oriented x 4, affect is flat but he brightens upon approach, he appears less anxious, his thoughts are organized and coherent , he clo of body pain earlier in the shift and was medicated per PRN orders, he was complaint with medication regimen, interacting appropriately with peers and staff, denies SI/HI/AVH, 15 minutes safety checks maintained will continue to monitor.

## 2019-02-03 NOTE — Tx Team (Signed)
Interdisciplinary Treatment and Diagnostic Plan Update  02/03/2019 Time of Session: 910am [place] MRN: [id number]  Principal Diagnosis: Bipolar disorder, curr episode mixed, severe, with psychotic features (HCC)  Secondary Diagnoses: Principal Problem:   Bipolar disorder, curr episode mixed, severe, with psychotic features (HCC) Active Problems:   HTN (hypertension)   Current Medications:  Current Facility-Administered Medications  Medication Dose Route Frequency Provider Last Rate Last Dose  . acetaminophen (TYLENOL) tablet 650 mg  650 mg Oral Q6H PRN Money, Gerlene Burdock, FNP      . alum & mag hydroxide-simeth (MAALOX/MYLANTA) 200-200-20 MG/5ML suspension 30 mL  30 mL Oral Q4H PRN Money, Gerlene Burdock, FNP      . apixaban (ELIQUIS) tablet 2.5 mg  2.5 mg Oral BID Clapacs, Jackquline Denmark, MD   2.5 mg at 02/03/19 0754  . citalopram (CELEXA) tablet 20 mg  20 mg Oral Daily Clapacs, Jackquline Denmark, MD   20 mg at 02/03/19 0754  . docusate sodium (COLACE) capsule 100 mg  100 mg Oral BID Clapacs, Jackquline Denmark, MD   100 mg at 02/03/19 0754  . feeding supplement (ENSURE ENLIVE) (ENSURE ENLIVE) liquid 237 mL  237 mL Oral BID BM Clapacs, Jackquline Denmark, MD   Stopped at 02/02/19 1834  . gabapentin (NEURONTIN) capsule 800 mg  800 mg Oral TID Clapacs, Jackquline Denmark, MD   800 mg at 02/03/19 0754  . hydrOXYzine (ATARAX/VISTARIL) tablet 25 mg  25 mg Oral TID PRN Money, Gerlene Burdock, FNP   25 mg at 01/31/19 2136  . hydrOXYzine (ATARAX/VISTARIL) tablet 50 mg  50 mg Oral QHS PRN Clapacs, Jackquline Denmark, MD   50 mg at 02/02/19 2147  . magnesium hydroxide (MILK OF MAGNESIA) suspension 30 mL  30 mL Oral Daily PRN Money, Feliz Beam B, FNP      . magnesium oxide (MAG-OX) tablet 400 mg  400 mg Oral Daily Clapacs, Jackquline Denmark, MD   400 mg at 02/03/19 0754  . metFORMIN (GLUCOPHAGE) tablet 500 mg  500 mg Oral Q breakfast Money, Gerlene Burdock, FNP   500 mg at 02/03/19 0754  . methocarbamol (ROBAXIN) tablet 750 mg  750 mg Oral Q6H PRN Clapacs, Jackquline Denmark, MD   750 mg at 02/02/19 2147  .  OLANZapine (ZYPREXA) tablet 20 mg  20 mg Oral QHS Clapacs, Jackquline Denmark, MD   20 mg at 02/02/19 2147  . oxyCODONE-acetaminophen (PERCOCET/ROXICET) 5-325 MG per tablet 1 tablet  1 tablet Oral Q6H PRN Clapacs, Jackquline Denmark, MD   1 tablet at 02/03/19 0754  . pantoprazole (PROTONIX) EC tablet 40 mg  40 mg Oral Daily Clapacs, Jackquline Denmark, MD   40 mg at 02/03/19 0754  . prazosin (MINIPRESS) capsule 2 mg  2 mg Oral QHS Clapacs, [name], MD   2 mg at 02/02/19 2147   PTA Medications: Medications Prior to Admission  Medication Sig Dispense Refill Last Dose  . metFORMIN (GLUCOPHAGE) 500 MG tablet Take 1 tablet (500 mg total) by mouth daily with breakfast. (Patient not taking: Reported on 01/25/2019) 30 tablet 0     Patient Stressors:    Patient Strengths:    Treatment Modalities: Medication Management, Group therapy, Case management,  1 to 1 session with clinician, Psychoeducation, Recreational therapy.   Physician Treatment Plan for Primary Diagnosis: Bipolar disorder, curr episode mixed, severe, with psychotic features (HCC) Long Term Goal(s): Improvement in symptoms so as ready for discharge Improvement in symptoms so as ready for discharge   Short Term Goals: Ability to verbalize feelings will  improve Ability to disclose and discuss suicidal ideas Ability to demonstrate self-control will improve Compliance with prescribed medications will improve Ability to identify triggers associated with substance abuse/mental health issues will improve  Medication Management: Evaluate patient's response, side effects, and tolerance of medication regimen.  Therapeutic Interventions: 1 to 1 sessions, Unit Group sessions and Medication administration.  Evaluation of Outcomes: Progressing  Physician Treatment Plan for Secondary Diagnosis: Principal Problem:   Bipolar disorder, curr episode mixed, severe, with psychotic features (HCC) Active Problems:   HTN (hypertension)  Long Term Goal(s): Improvement in symptoms so  as ready for discharge Improvement in symptoms so as ready for discharge   Short Term Goals: Ability to verbalize feelings will improve Ability to disclose and discuss suicidal ideas Ability to demonstrate self-control will improve Compliance with prescribed medications will improve Ability to identify triggers associated with substance abuse/mental health issues will improve     Medication Management: Evaluate patient's response, side effects, and tolerance of medication regimen.  Therapeutic Interventions: 1 to 1 sessions, Unit Group sessions and Medication administration.  Evaluation of Outcomes: Progressing   RN Treatment Plan for Primary Diagnosis: Bipolar disorder, curr episode mixed, severe, with psychotic features (HCC) Long Term Goal(s): Knowledge of disease and therapeutic regimen to maintain health will improve  Short Term Goals: Ability to verbalize feelings will improve and Ability to identify and develop effective coping behaviors will improve  Medication Management: RN will administer medications as ordered by provider, will assess and evaluate patient's response and provide education to patient for prescribed medication. RN will report any adverse and/or side effects to prescribing provider.  Therapeutic Interventions: 1 on 1 counseling sessions, Psychoeducation, Medication administration, Evaluate responses to treatment, Monitor vital signs and CBGs as ordered, Perform/monitor CIWA, COWS, AIMS and Fall Risk screenings as ordered, Perform wound care treatments as ordered.  Evaluation of Outcomes: Progressing   LCSW Treatment Plan for Primary Diagnosis: Bipolar disorder, curr episode mixed, severe, with psychotic features (HCC) Long Term Goal(s): Safe transition to appropriate next level of care at discharge, Engage patient in therapeutic group addressing interpersonal concerns.  Short Term Goals: Engage patient in aftercare planning with referrals and resources and  Increase skills for wellness and recovery  Therapeutic Interventions: Assess for all discharge needs, 1 to 1 time with Social worker, Explore available resources and support systems, Assess for adequacy in community support network, Educate family and significant other(s) on suicide prevention, Complete Psychosocial Assessment, Interpersonal group therapy.  Evaluation of Outcomes: Progressing   Progress in Treatment: Attending groups: Yes. Participating in groups: Yes. Taking medication as prescribed: Yes. Toleration medication: Yes. Family/Significant other contact made: No, will contact:  pt declined collateral contact Patient understands diagnosis: Yes. Discussing patient identified problems/goals with staff: Yes. Medical problems stabilized or resolved: Yes. Denies suicidal/homicidal ideation: No. Issues/concerns per patient self-inventory: No. Other: N/A  New problem(s) identified: No, Describe:  none  New Short Term/Long Term Goal(s): Detox, elimination of AVH/symptoms of psychosis, medication management for mood stabilization; elimination of SI thoughts; development of comprehensive mental wellness/sobriety plan.   Patient Goals:  " To get back and be normal and stay on my medicines"  Discharge Plan or Barriers: SPE pamphlet, Mobile Crisis information, and AA/NA information provided to patient for additional community support and resources. CSW assessing for appropriate referrals.   Reason for Continuation of Hospitalization: Anxiety Depression Medication stabilization Suicidal ideation  Estimated Length of Stay: 1-2 days   Recreational Therapy: Patient: N/A Patient Goal: Patient will engage in groups without prompting  or encouragement from LRT x3 group sessions within 5 recreation therapy group sessions   Attendees: Patient: 02/03/2019   Physician: Dr Jake Samples MD 02/03/2019   Nursing: Alyson Locket, RN 02/03/2019   RN Care Manager: 02/03/2019   Social Worker:  Lurline Idol, [place] 02/03/2019   Recreational Therapist:  02/03/2019   Other:  02/03/2019   Other:  02/03/2019   Other: 02/03/2019       Scribe for Treatment Team: Joanne Chars, [place] 02/03/2019 11:16 AM

## 2019-02-04 LAB — CBC
HCT: 38.7 % — ABNORMAL LOW (ref 39.0–52.0)
Hemoglobin: 12.3 g/dL — ABNORMAL LOW (ref 13.0–17.0)
MCH: 28.5 pg (ref 26.0–34.0)
MCHC: 31.8 g/dL (ref 30.0–36.0)
MCV: 89.6 fL (ref 80.0–100.0)
Platelets: 196 10*3/uL (ref 150–400)
RBC: 4.32 MIL/uL (ref 4.22–5.81)
RDW: 14.6 % (ref 11.5–15.5)
WBC: 5.8 10*3/uL (ref 4.0–10.5)
nRBC: 0.3 % — ABNORMAL HIGH (ref 0.0–0.2)

## 2019-02-04 NOTE — [facility] Group Notes (Signed)
LCSW Group Therapy Note 02/04/2019 1:15pm  Type of Therapy and Topic: Group Therapy: Feelings Around Returning Home & Establishing a Supportive Framework and Supporting Oneself When Supports Not Available  Participation Level: Active  Description of Group:  Patients first processed thoughts and feelings about upcoming discharge. These included fears of upcoming changes, lack of change, new living environments, judgements and expectations from others and overall stigma of mental health issues. The group then discussed the definition of a supportive framework, what that looks and feels like, and how do to discern it from an unhealthy non-supportive network. The group identified different types of supports as well as what to do when your family/friends are less than helpful or unavailable  Therapeutic Goals  1. Patient will identify one healthy supportive network that they can use at discharge. 2. Patient will identify one factor of a supportive framework and how to tell it from an unhealthy network. 3. Patient able to identify one coping skill to use when they do not have positive supports from others. 4. Patient will demonstrate ability to communicate their needs through discussion and/or role plays.  Summary of Patient Progress:  Pt was invited to attend group but chose not to attend. CSW will continue to encourage pt to attend group throughout their admission.   Therapeutic Modalities Cognitive Behavioral Therapy Motivational Interviewing   Cheree Ditto, LCSW 02/04/2019 12:36 PM
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
## 2019-02-04 NOTE — Plan of Care (Signed)
D- Patient alert and oriented. Patient presented in a pleasant mood on assessment reporting that he slept fair last night and continues to endorse chronic back pain, rating his pain a "6/10", stating to this writer that his pain is "a lot better". Patient denied anxiety, however, he stated that he has a little bit of depression, stating "I can't get myself together, I feel like I'm lonely". Patient also denied SI, HI, AVH stating "no, not right now, but I had some SI last night". Patient's goal for today is "myself", in which he will "open up more" in order to achieve his goal.  A- Scheduled medications administered to patient, per MD orders. Support and encouragement provided.  Routine safety checks conducted every 15 minutes.  Patient informed to notify staff with problems or concerns.  R- No adverse drug reactions noted. Patient contracts for safety at this time. Patient compliant with medications and treatment plan. Patient receptive, calm, and cooperative. Patient interacts well with others on the unit.  Patient remains safe at this time.  Problem: Education: Goal: Utilization of techniques to improve thought processes will improve Outcome: Progressing Goal: Knowledge of the prescribed therapeutic regimen will improve Outcome: Progressing   Problem: Activity: Goal: Interest or engagement in leisure activities will improve Outcome: Progressing Goal: Imbalance in normal sleep/wake cycle will improve Outcome: Progressing   Problem: Coping: Goal: Will verbalize feelings Outcome: Progressing   Problem: Education: Goal: Ability to state activities that reduce stress will improve Outcome: Progressing   Problem: Self-Concept: Goal: Level of anxiety will decrease Outcome: Progressing Goal: Ability to modify response to factors that promote anxiety will improve Outcome: Progressing   Problem: Education: Goal: Ability to make informed decisions regarding treatment will improve Outcome:  Progressing   Problem: Coping: Goal: Coping ability will improve Outcome: Progressing   Problem: Health Behavior/Discharge Planning: Goal: Identification of resources available to assist in meeting health care needs will improve Outcome: Progressing   Problem: Medication: Goal: Compliance with prescribed medication regimen will improve Outcome: Progressing

## 2019-02-05 MED ORDER — HYDROXYZINE HCL 50 MG PO TABS: 50.0000 mg | ORAL_TABLET | Freq: Every evening | ORAL | 1 refills | Status: DC | PRN

## 2019-02-05 MED ORDER — DICYCLOMINE HCL 10 MG PO CAPS: 10.0000 mg | ORAL_CAPSULE | Freq: Four times a day (QID) | ORAL | 1 refills | Status: DC | PRN

## 2019-02-05 MED ORDER — OLANZAPINE 20 MG PO TABS: 20.0000 mg | ORAL_TABLET | Freq: Every day | ORAL | 1 refills | Status: DC

## 2019-02-05 MED ORDER — GABAPENTIN 400 MG PO CAPS: 800.0000 mg | ORAL_CAPSULE | Freq: Three times a day (TID) | ORAL | 1 refills | Status: DC

## 2019-02-05 MED ORDER — PRAZOSIN HCL 2 MG PO CAPS: 2.0000 mg | ORAL_CAPSULE | Freq: Every day | ORAL | 1 refills | Status: DC

## 2019-02-05 MED ORDER — MAGNESIUM OXIDE 400 (241.3 MG) MG PO TABS: 400.0000 mg | ORAL_TABLET | Freq: Every day | ORAL | 1 refills | Status: DC

## 2019-02-05 MED ORDER — HYDROXYZINE HCL 25 MG PO TABS: 25.0000 mg | ORAL_TABLET | Freq: Three times a day (TID) | ORAL | 1 refills | Status: DC | PRN

## 2019-02-05 MED ORDER — DICYCLOMINE HCL 10 MG PO CAPS
10.0000 mg | ORAL_CAPSULE | Freq: Four times a day (QID) | ORAL | Status: DC | PRN
  Filled 2019-02-05: qty 1

## 2019-02-05 MED ORDER — CITALOPRAM HYDROBROMIDE 20 MG PO TABS: 20.0000 mg | ORAL_TABLET | Freq: Every day | ORAL | 1 refills | Status: DC

## 2019-02-05 MED ORDER — APIXABAN 2.5 MG PO TABS: 2.5000 mg | ORAL_TABLET | Freq: Two times a day (BID) | ORAL | 1 refills | Status: DC

## 2019-02-05 MED ORDER — DOCUSATE SODIUM 100 MG PO CAPS: 100.0000 mg | ORAL_CAPSULE | Freq: Two times a day (BID) | ORAL | 1 refills | Status: DC

## 2019-02-05 MED ORDER — METFORMIN HCL 500 MG PO TABS: 500.0000 mg | ORAL_TABLET | Freq: Every day | ORAL | 1 refills | Status: DC

## 2019-02-05 MED ORDER — PANTOPRAZOLE SODIUM 40 MG PO TBEC: 40.0000 mg | DELAYED_RELEASE_TABLET | Freq: Every day | ORAL | 1 refills | Status: DC

## 2019-02-05 NOTE — Progress Notes (Signed)
Recreation Therapy Notes  Date: 02/05/2019  Time: 9:30 am  Location: Craft room  Behavioral response: Appropriate   Intervention Topic: Anger Management   Discussion/Intervention:  Group content on today was focused on anger management. The group defined anger and reasons they become angry. Individuals expressed negative way they have dealt with anger in the past. Patients stated some positive ways they could deal with anger in the future. The group described how anger can affect your health and daily plans. Individuals participated in the intervention "Score your anger" where they had a chance to answer questions about themselves and get a score of their anger.  Clinical Observations/Feedback:  Patient came to group and identified deep breathing and thinking before acting as ways he manages his anger. He explained that his age has made him wiser when it comes to dealing with anger.  Individual was social with peers and staff while participating in the intervention.    Wendelin Reader LRT/CTRS         Fredrick Dray 02/05/2019 11:44 AM

## 2019-02-05 NOTE — Progress Notes (Signed)
D: Patient reports being irritable at beginning of shift. Appears anxious. Denies SI, HI and AVH. Continues to complain of chronic back pain. Medicated per prn order. A: Continue to monitor for safety R: Safety maintained.

## 2019-02-05 NOTE — Plan of Care (Signed)
  Problem: Education: Goal: Utilization of techniques to improve thought processes will improve Outcome: Progressing Goal: Knowledge of the prescribed therapeutic regimen will improve Outcome: Progressing  D: Patient reports being irritable at beginning of shift. Appears anxious. Denies SI, HI and AVH. Continues to complain of chronic back pain. Medicated per prn order. A: Continue to monitor for safety R: Safety maintained.

## 2019-02-05 NOTE — Progress Notes (Signed)
  [facility] Adult Case Management Discharge Plan :  Will you be returning to the same living situation after discharge:  Yes,  home At discharge, do you have transportation home?: Yes,  PART pass Do you have the ability to pay for your medications: Yes,  mental health  Release of information consent forms completed and in the chart;    Patient to Follow up at: Follow-up Information    [facility] Follow up on 02/13/2019.   Why: You have an appointment scheduled with Dr. Mariea Clonts on 02/13/2019 at 10:00AM. Thank You! Contact information: [place] [phone]           Next level of care provider has access to [place] and Suicide Prevention discussed: Yes,  SPE completed with pt as pt declined collateral contact  Have you used any form of tobacco in the last 30 days? (Cigarettes, Smokeless Tobacco, Cigars, and/or Pipes): No  Has patient been referred to the Quitline?: N/A patient is not a smoker  Patient has been referred for addiction treatment: [place], LCSW 02/05/2019, 9:44 AM

## 2019-02-05 NOTE — Progress Notes (Signed)
Patient ID: Dale [name], male   DOB: 04-01-1966, 53 y.o.   MRN: [id number]  Discharge Note:  Patient denies SI/HI/AVH at this time. Discharge instructions, AVS, prescriptions, seven-day supply, and transition record gone over with patient. Patient agrees to comply with medication management, follow-up visit, and outpatient therapy. Patient belongings returned to patient. Patient questions and concerns addressed and answered. Patient ambulatory off unit. Patient discharged to bus stop via [place] car.
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
## 2019-02-05 NOTE — Progress Notes (Signed)
Recreation Therapy Notes  INPATIENT RECREATION TR PLAN  Patient Details Name: Dale [name] MRN: [id number] DOB: June 01, 1965 Today's Date: 02/05/2019  Rec Therapy Plan Is patient appropriate for Therapeutic Recreation?: Yes Treatment times per week: at least 3 Estimated Length of Stay: 5-7 days TR Treatment/Interventions: Group participation (Comment)  Discharge Criteria Pt will be discharged from therapy if:: Discharged Treatment plan/goals/alternatives discussed and agreed upon by:: Patient/family  Discharge Summary Short term goals set: Patient will engage in groups without prompting or encouragement from LRT x3 group sessions within 5 recreation therapy group sessions Short term goals met: Complete Progress toward goals comments: Groups attended Which groups?: Leisure education, Coping skills, Anger management, Goal setting Reason goals not met: N/A Therapeutic equipment acquired: N/A Reason patient discharged from therapy: Discharge from hospital Pt/family agrees with progress & goals achieved: Yes Date patient discharged from therapy: 02/05/19   Elaysha Bevard 02/05/2019, 12:25 PM

## 2019-02-05 NOTE — [facility] Suicide Risk Assessment (Signed)
[facility] Discharge Suicide Risk Assessment   Principal Problem: Bipolar disorder, curr episode mixed, severe, with psychotic features ([place]) Discharge Diagnoses: Principal Problem:   Bipolar disorder, curr episode mixed, severe, with psychotic features ([place]) Active Problems:   HTN (hypertension)   Total Time spent with patient: 30 minutes  Musculoskeletal: Strength & Muscle Tone: within normal limits Gait & Station: normal Patient leans: N/A  Psychiatric Specialty Exam: Review of Systems  Constitutional: Negative.   HENT: Negative.   Eyes: Negative.   Respiratory: Negative.   Cardiovascular: Negative.   Gastrointestinal: Negative.   Musculoskeletal: Positive for back pain.  Skin: Negative.   Neurological: Negative.   Psychiatric/Behavioral: Negative.     Blood pressure 130/70, pulse 93, temperature 98.1 F (36.7 C), temperature source Oral, resp. rate 17, height 5\' 9"  (1.753 m), weight 75.3 kg, SpO2 98 %.Body mass index is 24.51 kg/m.  General Appearance: Casual  Eye Contact::  Good  Speech:  Normal Rate409  Volume:  Normal  Mood:  Euthymic  Affect:  Congruent  Thought Process:  Goal Directed  Orientation:  Full (Time, Place, and Person)  Thought Content:  Logical  Suicidal Thoughts:  No  Homicidal Thoughts:  No  Memory:  Immediate;   Fair Recent;   Fair Remote;   Fair  Judgement:  Fair  Insight:  Fair  Psychomotor Activity:  Normal  Concentration:  Fair  Recall:  [employer] of Knowledge:Fair  Language: Fair  Akathisia:  No  Handed:  Right  AIMS (if indicated):     Assets:  Desire for Improvement Resilience  Sleep:  Number of Hours: 6  Cognition: WNL  ADL's:  Intact   Mental Status Per Nursing Assessment::   On Admission:  Suicidal ideation indicated by patient  Demographic Factors:  Male, Low socioeconomic status and Unemployed  Loss Factors: Financial problems/change in socioeconomic status  Historical Factors: Impulsivity  Risk Reduction  Factors:   Religious beliefs about death and Positive therapeutic relationship  Continued Clinical Symptoms:  Bipolar Disorder:   Mixed State  Cognitive Features That Contribute To Risk:  None    Suicide Risk:  Minimal: No identifiable suicidal ideation.  Patients presenting with no risk factors but with morbid ruminations; may be classified as minimal risk based on the severity of the depressive symptoms  Follow-up Information    [facility] Follow up on 02/13/2019.   Why: You have an appointment scheduled with Dr. Mariea Clonts on 02/13/2019 at 10:00AM. Thank You! Contact information: [address] [place] [id number] [phone]           Plan Of Care/Follow-up recommendations:  Activity:  Activity as tolerated Diet:  Regular diet Other:  Follow-up with [facility].  Keep regular hours.  Minimize stress.  Stay on medication.  Alethia Berthold, MD 02/05/2019, 10:46 AM

## 2019-02-05 NOTE — Plan of Care (Signed)
  Problem: Group Participation Goal: STG - Patient will engage in groups without prompting or encouragement from LRT x3 group sessions within 5 recreation therapy group sessions Description: STG - Patient will engage in groups without prompting or encouragement from LRT x3 group sessions within 5 recreation therapy group sessions Outcome: Completed/Met

## 2019-02-07 ENCOUNTER — Other Ambulatory Visit: Payer: Self-pay | Admitting: Family

## 2019-02-17 ENCOUNTER — Emergency Department (HOSPITAL_COMMUNITY): Payer: Self-pay

## 2019-02-17 ENCOUNTER — Other Ambulatory Visit: Payer: Self-pay

## 2019-02-17 ENCOUNTER — Emergency Department (HOSPITAL_COMMUNITY)
Admission: EM | Admit: 2019-02-17 | Discharge: 2019-02-17 | Disposition: A | Discharge status: Home / Self Care | Payer: Self-pay | Attending: Emergency Medicine | Admitting: Emergency Medicine

## 2019-02-17 DIAGNOSIS — M25571 Pain in right ankle and joints of right foot: Secondary | ICD-10-CM | POA: Insufficient documentation

## 2019-02-17 DIAGNOSIS — Y999 Unspecified external cause status: Secondary | ICD-10-CM | POA: Insufficient documentation

## 2019-02-17 DIAGNOSIS — X501XXA Overexertion from prolonged static or awkward postures, initial encounter: Secondary | ICD-10-CM | POA: Insufficient documentation

## 2019-02-17 DIAGNOSIS — W1789XA Other fall from one level to another, initial encounter: Secondary | ICD-10-CM | POA: Insufficient documentation

## 2019-02-17 DIAGNOSIS — Z79899 Other long term (current) drug therapy: Secondary | ICD-10-CM | POA: Insufficient documentation

## 2019-02-17 DIAGNOSIS — Z7901 Long term (current) use of anticoagulants: Secondary | ICD-10-CM | POA: Insufficient documentation

## 2019-02-17 DIAGNOSIS — I10 Essential (primary) hypertension: Secondary | ICD-10-CM | POA: Insufficient documentation

## 2019-02-17 DIAGNOSIS — Z7984 Long term (current) use of oral hypoglycemic drugs: Secondary | ICD-10-CM | POA: Insufficient documentation

## 2019-02-17 DIAGNOSIS — E119 Type 2 diabetes mellitus without complications: Secondary | ICD-10-CM | POA: Insufficient documentation

## 2019-02-17 DIAGNOSIS — Z720 Tobacco use: Secondary | ICD-10-CM | POA: Insufficient documentation

## 2019-02-17 DIAGNOSIS — Y9339 Activity, other involving climbing, rappelling and jumping off: Secondary | ICD-10-CM | POA: Insufficient documentation

## 2019-02-17 MED ORDER — PREDNISONE 20 MG PO TABS: 20.0000 mg | ORAL_TABLET | Freq: Two times a day (BID) | ORAL | 0 refills | Status: AC

## 2019-02-17 MED ORDER — HYDROCODONE-ACETAMINOPHEN 5-325 MG PO TABS
1.0000 | ORAL_TABLET | Freq: Once | ORAL | Status: AC
  Administered 2019-02-17: 1 via ORAL
  Filled 2019-02-17: qty 1

## 2019-02-17 MED ORDER — ACETAMINOPHEN 500 MG PO TABS: 500.0000 mg | ORAL_TABLET | Freq: Four times a day (QID) | ORAL | 0 refills | Status: DC | PRN

## 2019-02-17 NOTE — ED Notes (Signed)
Pt. Was fitted for crutches and on discharged refused to take them.

## 2019-02-17 NOTE — ED Triage Notes (Signed)
Pt to ED c/o right ankle injury, reports fell on porch last night and has been in constant pain since. Pt able to bear weight, but is painful, ankle obviously swollen, pt able to move toes.

## 2019-02-17 NOTE — Discharge Instructions (Signed)
1. Medications: Take steroid taper as prescribed with food to avoid upset stomach issues.  Do not take ibuprofen, Advil, Aleve, or Motrin while taking this medicine.  You may take [phone] mg of Tylenol every 6 hours as needed for pain. Do not exceed 4000 mg of Tylenol daily.   2. Treatment: rest, ice, elevate and use brace and crutches, drink plenty of fluids, gentle stretching (see attached exercises, but you can also YouTube search physical therapy exercises).   3. Follow Up: Please followup with orthopedics as directed or your PCP in 1 week if no improvement for discussion of your diagnoses and further evaluation after today's visit; if you do not have a primary care doctor use the resource guide provided to find one; Please return to the ER for worsening symptoms or other concerns such as worsening swelling, redness of the skin, fevers, loss of pulses, or loss of feeling
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
## 2019-02-17 NOTE — Progress Notes (Signed)
Orthopedic Tech Progress Note Patient Details:  Dale [name] 1965/05/28 [id number]  Ortho Devices Type of Ortho Device: ASO, Crutches Ortho Device/Splint Location: right Ortho Device/Splint Interventions: Application   Post Interventions Patient Tolerated: Well Instructions Provided: Care of device   [name] 02/17/2019, 2:48 PM

## 2019-02-19 ENCOUNTER — Emergency Department (HOSPITAL_COMMUNITY)
Admission: EM | Admit: 2019-02-19 | Discharge: 2019-02-20 | Disposition: A | Discharge status: Home / Self Care | Payer: Self-pay | Attending: Emergency Medicine | Admitting: Emergency Medicine

## 2019-02-19 ENCOUNTER — Other Ambulatory Visit: Payer: Self-pay

## 2019-02-19 DIAGNOSIS — I10 Essential (primary) hypertension: Secondary | ICD-10-CM | POA: Insufficient documentation

## 2019-02-19 DIAGNOSIS — R45851 Suicidal ideations: Secondary | ICD-10-CM | POA: Insufficient documentation

## 2019-02-19 DIAGNOSIS — Z7901 Long term (current) use of anticoagulants: Secondary | ICD-10-CM | POA: Insufficient documentation

## 2019-02-19 DIAGNOSIS — R45850 Homicidal ideations: Secondary | ICD-10-CM | POA: Insufficient documentation

## 2019-02-19 DIAGNOSIS — Z79899 Other long term (current) drug therapy: Secondary | ICD-10-CM | POA: Insufficient documentation

## 2019-02-19 DIAGNOSIS — E119 Type 2 diabetes mellitus without complications: Secondary | ICD-10-CM | POA: Insufficient documentation

## 2019-02-19 DIAGNOSIS — Z20828 Contact with and (suspected) exposure to other viral communicable diseases: Secondary | ICD-10-CM | POA: Insufficient documentation

## 2019-02-19 DIAGNOSIS — F312 Bipolar disorder, current episode manic severe with psychotic features: Secondary | ICD-10-CM | POA: Insufficient documentation

## 2019-02-19 DIAGNOSIS — F17210 Nicotine dependence, cigarettes, uncomplicated: Secondary | ICD-10-CM | POA: Insufficient documentation

## 2019-02-19 LAB — CBC
HCT: 42.2 % (ref 39.0–52.0)
Hemoglobin: 13.4 g/dL (ref 13.0–17.0)
MCH: 29.1 pg (ref 26.0–34.0)
MCHC: 31.8 g/dL (ref 30.0–36.0)
MCV: 91.5 fL (ref 80.0–100.0)
Platelets: 254 10*3/uL (ref 150–400)
RBC: 4.61 MIL/uL (ref 4.22–5.81)
RDW: 14.6 % (ref 11.5–15.5)
WBC: 6.5 10*3/uL (ref 4.0–10.5)
nRBC: 0 % (ref 0.0–0.2)

## 2019-02-19 LAB — SARS CORONAVIRUS 2 (TAT 6-24 HRS): SARS Coronavirus 2: NEGATIVE

## 2019-02-19 LAB — COMPREHENSIVE METABOLIC PANEL
ALT: 18 U/L (ref 0–44)
AST: 20 U/L (ref 15–41)
Albumin: 3.4 g/dL — ABNORMAL LOW (ref 3.5–5.0)
Alkaline Phosphatase: 61 U/L (ref 38–126)
Anion gap: 8 (ref 5–15)
BUN: 14 mg/dL (ref 6–20)
CO2: 24 mmol/L (ref 22–32)
Calcium: 9 mg/dL (ref 8.9–10.3)
Chloride: 109 mmol/L (ref 98–111)
Creatinine, Ser: 1.3 mg/dL — ABNORMAL HIGH (ref 0.61–1.24)
GFR calc Af Amer: >60 mL/min (ref >60)
GFR calc non Af Amer: >60 mL/min (ref >60)
Glucose, Bld: 139 mg/dL — ABNORMAL HIGH (ref 70–99)
Potassium: 3.4 mmol/L — ABNORMAL LOW (ref 3.5–5.1)
Sodium: 141 mmol/L (ref 135–145)
Total Bilirubin: 0.2 mg/dL — ABNORMAL LOW (ref 0.3–1.2)
Total Protein: 6.4 g/dL — ABNORMAL LOW (ref 6.5–8.1)

## 2019-02-19 LAB — ETHANOL: Alcohol, Ethyl (B): <10 mg/dL (ref <10)

## 2019-02-19 LAB — RAPID URINE DRUG SCREEN, HOSP PERFORMED
Amphetamines: NOT DETECTED
Barbiturates: NOT DETECTED
Benzodiazepines: NOT DETECTED
Cocaine: POSITIVE — AB
Opiates: NOT DETECTED
Tetrahydrocannabinol: POSITIVE — AB

## 2019-02-19 LAB — SALICYLATE LEVEL: Salicylate Lvl: <7 mg/dL (ref 2.8–30.0)

## 2019-02-19 LAB — ACETAMINOPHEN LEVEL: Acetaminophen (Tylenol), Serum: <10 ug/mL — ABNORMAL LOW (ref 10–30)

## 2019-02-19 MED ORDER — OLANZAPINE 5 MG PO TABS
20.0000 mg | ORAL_TABLET | Freq: Every day | ORAL | Status: DC
  Administered 2019-02-19: 20 mg via ORAL
  Filled 2019-02-19: qty 2
  Filled 2019-02-19: qty 4

## 2019-02-19 MED ORDER — GABAPENTIN 400 MG PO CAPS
800.0000 mg | ORAL_CAPSULE | Freq: Three times a day (TID) | ORAL | Status: DC
  Administered 2019-02-19: 800 mg via ORAL
  Filled 2019-02-19: qty 2

## 2019-02-19 MED ORDER — CITALOPRAM HYDROBROMIDE 10 MG PO TABS
20.0000 mg | ORAL_TABLET | Freq: Every day | ORAL | Status: DC
  Administered 2019-02-19 – 2019-02-20 (×2): 20 mg via ORAL
  Filled 2019-02-19 (×2): qty 2

## 2019-02-19 MED ORDER — DICYCLOMINE HCL 10 MG PO CAPS: 10.0000 mg | ORAL_CAPSULE | Freq: Four times a day (QID) | ORAL | Status: DC | PRN

## 2019-02-19 MED ORDER — MAGNESIUM OXIDE 400 (241.3 MG) MG PO TABS
400.0000 mg | ORAL_TABLET | Freq: Every day | ORAL | Status: DC
  Administered 2019-02-20: 400 mg via ORAL
  Filled 2019-02-19: qty 1

## 2019-02-19 MED ORDER — ACETAMINOPHEN 325 MG PO TABS
650.0000 mg | ORAL_TABLET | Freq: Four times a day (QID) | ORAL | Status: DC | PRN
  Administered 2019-02-19: 650 mg via ORAL
  Filled 2019-02-19: qty 2

## 2019-02-19 MED ORDER — PREDNISONE 20 MG PO TABS
20.0000 mg | ORAL_TABLET | Freq: Two times a day (BID) | ORAL | Status: DC
  Administered 2019-02-19 – 2019-02-20 (×3): 20 mg via ORAL
  Filled 2019-02-19 (×3): qty 1

## 2019-02-19 MED ORDER — DOCUSATE SODIUM 100 MG PO CAPS
100.0000 mg | ORAL_CAPSULE | Freq: Two times a day (BID) | ORAL | Status: DC
  Administered 2019-02-19 – 2019-02-20 (×3): 100 mg via ORAL
  Filled 2019-02-19 (×3): qty 1

## 2019-02-19 MED ORDER — METFORMIN HCL 500 MG PO TABS
500.0000 mg | ORAL_TABLET | Freq: Every day | ORAL | Status: DC
  Administered 2019-02-19 – 2019-02-20 (×2): 500 mg via ORAL
  Filled 2019-02-19 (×2): qty 1

## 2019-02-19 MED ORDER — APIXABAN 2.5 MG PO TABS
2.5000 mg | ORAL_TABLET | Freq: Two times a day (BID) | ORAL | Status: DC
  Administered 2019-02-19 – 2019-02-20 (×3): 2.5 mg via ORAL
  Filled 2019-02-19 (×5): qty 1

## 2019-02-19 MED ORDER — HYDROXYZINE HCL 50 MG PO TABS: 50.0000 mg | ORAL_TABLET | Freq: Every evening | ORAL | Status: DC | PRN

## 2019-02-19 MED ORDER — MAGNESIUM OXIDE 400 (241.3 MG) MG PO TABS
400.0000 mg | ORAL_TABLET | Freq: Every day | ORAL | Status: DC
  Administered 2019-02-19: 11:00:00 400 mg via ORAL
  Filled 2019-02-19: qty 1

## 2019-02-19 MED ORDER — ONDANSETRON HCL 4 MG PO TABS: 4.0000 mg | ORAL_TABLET | Freq: Three times a day (TID) | ORAL | Status: DC | PRN

## 2019-02-19 MED ORDER — LORAZEPAM 2 MG/ML IJ SOLN
1.0000 mg | Freq: Once | INTRAMUSCULAR | Status: AC
  Administered 2019-02-19: 1 mg via INTRAMUSCULAR
  Filled 2019-02-19: qty 1

## 2019-02-19 MED ORDER — PRAZOSIN HCL 2 MG PO CAPS
2.0000 mg | ORAL_CAPSULE | Freq: Every day | ORAL | Status: DC
  Administered 2019-02-19: 2 mg via ORAL
  Filled 2019-02-19 (×2): qty 1

## 2019-02-19 MED ORDER — ALUM & MAG HYDROXIDE-SIMETH 200-200-20 MG/5ML PO SUSP: 30.0000 mL | Freq: Four times a day (QID) | ORAL | Status: DC | PRN

## 2019-02-19 MED ORDER — HYDROXYZINE HCL 25 MG PO TABS: 25.0000 mg | ORAL_TABLET | Freq: Three times a day (TID) | ORAL | Status: DC | PRN

## 2019-02-19 MED ORDER — PANTOPRAZOLE SODIUM 40 MG PO TBEC
40.0000 mg | DELAYED_RELEASE_TABLET | Freq: Every day | ORAL | Status: DC
  Administered 2019-02-19 – 2019-02-20 (×2): 40 mg via ORAL
  Filled 2019-02-19 (×2): qty 1

## 2019-02-19 MED ORDER — GABAPENTIN 300 MG PO CAPS
300.0000 mg | ORAL_CAPSULE | Freq: Three times a day (TID) | ORAL | Status: DC
  Administered 2019-02-19 – 2019-02-20 (×3): 300 mg via ORAL
  Filled 2019-02-19 (×3): qty 1

## 2019-02-19 NOTE — ED Triage Notes (Signed)
Per pt he has been off his medication for 2 to 3 weeks now and for 4 to 5 days he is saying he does not feel safe to be at home and has been hearing voices to kills himself. Pt said he doe snot have a plan.
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
## 2019-02-19 NOTE — ED Notes (Signed)
Breakfast ordered for patient  
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
## 2019-02-19 NOTE — BH Assessment (Signed)
[facility] Assessment Progress Note  Case was staffed with Dwyane Dee MD who recommended patient be considered for [facility]. Status pending. Patient will be observed and monitored until facility can be contacted to check on availability.

## 2019-02-19 NOTE — BHH Counselor (Signed)
TTS spoke with nurse Anderson Malta, per nurse patient will  bemoved to room in about 40 to 45 min and she will call back TTS to complete assessment , thanks.

## 2019-02-19 NOTE — BHH Counselor (Signed)
TTS attempting to complete assessment with patient. Spoke with Network engineer, waiting for patient to be moved to a room and cart to be set up. Thanks

## 2019-02-19 NOTE — ED Notes (Signed)
Patient making phone call-Monique,RN

## 2019-02-19 NOTE — ED Notes (Signed)
Good appetite at breakfast

## 2019-02-19 NOTE — ED Notes (Addendum)
TTS completion pending

## 2019-02-19 NOTE — ED Notes (Signed)
Patient belongings have been inventoried and are currently in [place] [address].

## 2019-02-19 NOTE — ED Notes (Signed)
Pt eating [place] sandwich and drinking water at this time.

## 2019-02-19 NOTE — ED Notes (Signed)
Patient changed into [place] and has been wanded by [facility] ED Security. 

## 2019-02-19 NOTE — ED Notes (Signed)
Pt refusing VS. Wants to sleep at this time

## 2019-02-20 NOTE — ED Notes (Signed)
Dale [name] [name], advised pt was recommended for d/c and may go to [facility]. Advised she will request [facility] NP to clarify note.

## 2019-02-20 NOTE — Discharge Instructions (Addendum)
Substance Abuse Treatment Programs ° °Intensive Outpatient Programs °[facility]     °[address]      °[place], [place]                   °[phone]      ° °[facility] °[address] °[place], [place] °[phone] ° °[facility]     °(Inpatient and outpatient)     °[address]           °[phone]   ° °[facility] °[phone] (Suboxone and Methadone) ° °[address]      °[place], [place] [address]      °[phone]      ° °[address] °[place], [place] °852-3033 ° °[facility] (Outpatient/Inpatient, Chemical)    °(insurance only) [phone]      °       °[facility] (Groups & Residential) °[place], [place] °[phone] ° °   °[facility]     °[address]     °[place], [place]      °[phone]      ° °[facility] (for caregivers and family) °[address] °[place], [place] °336-299-4655 ° ° ° ° ° °Residential Treatment Programs °[facility]      °[address], [place], [place] [address]  °([phone]      ° °[facility] °[address] [place], [place] [address] °[phone] ° °[facility]        °[phone]      ° °[facility] °1-800-659-3381 ° °[facility]             °[address]                                         °[place], [place]                                                °877-615-2722 or 336-784-9470                              ° °[facility] [place] °[address] °[place], [address] °[phone] ° °[facility]    °[address]      °[place], [place]     °[phone]      ° °[facility] °[address] °[place], [place] °336-285-9073 ° °[facility]   °[address]     °[place], [place] [address]     °[phone]      °Admissions: 8am-3pm M-F ° °[facility] °[address] °[place],  [place] °336-227-7417 ° °[facility]: Residential Program (90 Days)   °[place], [place]      °336-725-8389 or 800-758-6077    ° °[facility] °[place], [place] °(Walk in Hours over the weekend or by referral) ° °[facility] °[address], [place],  [address] °([phone] ° °Crisis Mobile: [facility]:  1-877-626-1772 (for crisis response 24 hours a day) °[facility] Hotline:      1-800-256-2452 °Outpatient Psychiatry and Counseling ° °[facility]: Mobile Crisis   Management 24 hours:  1-877-626-1772 ° °[facility] sliding scale fee and walk in schedule: M-F 8am-12pm/1pm-3pm °[address]  °[place], [place] [address] °[phone] ° °[facility] °[address] °[place], [place] [address] °336-703-9650 ° °[facility] ([facility] known [facility])- new patient walk-in appointments available Monday - Friday 8am -3pm.          °[address] °[place], [place] [address] °336-676-6840 or crisis line- 336-676-6905 ° °[facility] Intensive Outpatient Therapy Program °[address] °[place], [place] [address] °336-832-9804 ° °[facility]                  °Crisis Services      °[phone]      °[address]     °[place], [place] [address]                ° °[facility]   °[facility] °[phone] °[address] °[place], [place] [address] ° ° °[facility]          °[address]  °[place], [place] [address]       °[phone] ° °[facility] °[address] °[place], [place] [address] °336-292-1510 ° °[facility]    °[address]    °[place], [place] [address]     °[phone]      ° °Parish McKinney, MD     °[address]     °[place] [address]     °336-282-1251     °  °[facility] °[address] °[place] [address] ° °[facility]     °[address]     °[place], [place]      °336-542-2076      ° °[facility] °Shamsher Ahluwalia, MD °[address] °[place], [place] [address] °[phone] ° °[facility]     °[address]     °[place], [place] [address]     °336-274-1237      ° °[facility] °[address] °[place], [place] [address] °336-854-4450 °Resources for Temporary Residential Assistance/Crisis Centers ° °DAY CENTERS °[facility] °M-F 8am-3pm   °[address] [place], [place] [address]   336-332-0824 °Services include: laundry, barbering, support groups, case management, phone  & computer access, showers, AA/NA mtgs, mental health/substance abuse nurse, job skills class, disability information, VA assistance, spiritual classes, etc.  ° °HOMELESS SHELTERS ° °[facility]     °[facility]   °[address], [place]     °[phone]       °       °[facility] (women and children)       °[address] °[place], [place] [address] °336-275-0820 °[email or web address] for application and process °Application Required ° °[facility]   °[address]    °[place] [address]     °[phone]       °             °[facility] °[address] °, [place] [address] °336.273.5572 °336-235-0363(schedule application appt.) °Application Required ° °[facility] (women only)    °[address]     °[place],  [address]     °336-884-1039      °  Intake starts 6pm daily °Need valid ID, SSC, & Police report °[facility] [place] °[address] °[place], [place] °336-881-5420 °Application Required ° °[facility] (men only)     °[address]      °[place], [place]     °[phone]      ° °[facility] °(Pregnant women only) °[address] °, [place] °336-275-0206 ° °[facility]      °[address]      °[place], [place] [address]     °[phone]      °       °[facility] °[address] °[place], [place] °336-723-1848 °90 day commitment/SA/Application process ° °[facility](men only)     °[address]     °[place], [place]     °[phone]       °Check-in at 7pm     °       °[facility] °[address] °[place], [place] [address] °336-248-6684 °Men/Women/Women and Children must be there by 7 pm ° °[facility] °[place],  °336-722-8721                ° °

## 2019-02-20 NOTE — ED Notes (Signed)
Pt ate breakfast.

## 2019-02-20 NOTE — ED Notes (Signed)
Telepsych completed.  

## 2019-02-20 NOTE — ED Provider Notes (Signed)
Patient cleared by psychiatry. Resting comfortably, no acute distress. VSS. D/c    Sherwood Gambler, MD 02/20/19 1538
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
## 2019-02-20 NOTE — ED Notes (Signed)
Diet was ordered for Lunch. 

## 2019-02-20 NOTE — ED Notes (Addendum)
Pt noted to be irritated being d/c'd. [place] x2 - pt declined - states "they work you to death". Pt states he has rx's at a pharmacy he is unable to get to d/t transportation. Advised pt w/Dr Regenia Skeeter that he may contact the pharmacy of his preference and ask for them to contact other pharmacy to have meds sent to them. Pt voiced understanding. ALL belongings - 2 labeled belongings bags - returned to pt - Pt signed verifying all items present. Pt stated he had money in shirt of burgundy scrub - pt retrieved this money - verified it was all present.

## 2019-02-20 NOTE — ED Notes (Signed)
Per [facility] NP, pt is psych cleared - advised pt decling to go to [facility]. Pt lying on bed w/eyes closed. Respirations even, unlabored.

## 2019-03-03 ENCOUNTER — Other Ambulatory Visit: Payer: Self-pay

## 2019-03-03 ENCOUNTER — Encounter (HOSPITAL_COMMUNITY): Payer: Self-pay | Admitting: *Deleted

## 2019-03-03 ENCOUNTER — Emergency Department (HOSPITAL_COMMUNITY)
Admission: EM | Admit: 2019-03-03 | Discharge: 2019-03-04 | Disposition: A | Discharge status: Home / Self Care | Payer: Self-pay | Attending: Emergency Medicine | Admitting: Emergency Medicine

## 2019-03-03 DIAGNOSIS — F1410 Cocaine abuse, uncomplicated: Secondary | ICD-10-CM | POA: Insufficient documentation

## 2019-03-03 DIAGNOSIS — Z7984 Long term (current) use of oral hypoglycemic drugs: Secondary | ICD-10-CM | POA: Insufficient documentation

## 2019-03-03 DIAGNOSIS — Z79899 Other long term (current) drug therapy: Secondary | ICD-10-CM | POA: Insufficient documentation

## 2019-03-03 DIAGNOSIS — E119 Type 2 diabetes mellitus without complications: Secondary | ICD-10-CM | POA: Insufficient documentation

## 2019-03-03 DIAGNOSIS — Z20828 Contact with and (suspected) exposure to other viral communicable diseases: Secondary | ICD-10-CM | POA: Insufficient documentation

## 2019-03-03 DIAGNOSIS — Z7901 Long term (current) use of anticoagulants: Secondary | ICD-10-CM | POA: Insufficient documentation

## 2019-03-03 DIAGNOSIS — F332 Major depressive disorder, recurrent severe without psychotic features: Secondary | ICD-10-CM | POA: Insufficient documentation

## 2019-03-03 DIAGNOSIS — I10 Essential (primary) hypertension: Secondary | ICD-10-CM | POA: Insufficient documentation

## 2019-03-03 DIAGNOSIS — R45851 Suicidal ideations: Secondary | ICD-10-CM | POA: Insufficient documentation

## 2019-03-03 DIAGNOSIS — F1210 Cannabis abuse, uncomplicated: Secondary | ICD-10-CM | POA: Insufficient documentation

## 2019-03-03 DIAGNOSIS — F17210 Nicotine dependence, cigarettes, uncomplicated: Secondary | ICD-10-CM | POA: Insufficient documentation

## 2019-03-03 LAB — COMPREHENSIVE METABOLIC PANEL
ALT: 16 U/L (ref 0–44)
AST: 19 U/L (ref 15–41)
Albumin: 3.8 g/dL (ref 3.5–5.0)
Alkaline Phosphatase: 75 U/L (ref 38–126)
Anion gap: 7 (ref 5–15)
BUN: 15 mg/dL (ref 6–20)
CO2: 26 mmol/L (ref 22–32)
Calcium: 9.3 mg/dL (ref 8.9–10.3)
Chloride: 105 mmol/L (ref 98–111)
Creatinine, Ser: 1.42 mg/dL — ABNORMAL HIGH (ref 0.61–1.24)
GFR calc Af Amer: >60 mL/min (ref >60)
GFR calc non Af Amer: 56 mL/min — ABNORMAL LOW (ref >60)
Glucose, Bld: 84 mg/dL (ref 70–99)
Potassium: 4.4 mmol/L (ref 3.5–5.1)
Sodium: 138 mmol/L (ref 135–145)
Total Bilirubin: 0.5 mg/dL (ref 0.3–1.2)
Total Protein: 7.2 g/dL (ref 6.5–8.1)

## 2019-03-03 LAB — RAPID URINE DRUG SCREEN, HOSP PERFORMED
Amphetamines: NOT DETECTED
Barbiturates: NOT DETECTED
Benzodiazepines: NOT DETECTED
Cocaine: POSITIVE — AB
Opiates: NOT DETECTED
Tetrahydrocannabinol: POSITIVE — AB

## 2019-03-03 LAB — CBC WITH DIFFERENTIAL/PLATELET
Abs Immature Granulocytes: 0.03 10*3/uL (ref 0.00–0.07)
Basophils Absolute: 0 10*3/uL (ref 0.0–0.1)
Basophils Relative: 0 %
Eosinophils Absolute: 0.2 10*3/uL (ref 0.0–0.5)
Eosinophils Relative: 3 %
HCT: 48.1 % (ref 39.0–52.0)
Hemoglobin: 15.1 g/dL (ref 13.0–17.0)
Immature Granulocytes: 0 %
Lymphocytes Relative: 20 %
Lymphs Abs: 1.4 10*3/uL (ref 0.7–4.0)
MCH: 29.2 pg (ref 26.0–34.0)
MCHC: 31.4 g/dL (ref 30.0–36.0)
MCV: 93 fL (ref 80.0–100.0)
Monocytes Absolute: 0.6 10*3/uL (ref 0.1–1.0)
Monocytes Relative: 9 %
Neutro Abs: 5 10*3/uL (ref 1.7–7.7)
Neutrophils Relative %: 68 %
Platelets: 284 10*3/uL (ref 150–400)
RBC: 5.17 MIL/uL (ref 4.22–5.81)
RDW: 14.6 % (ref 11.5–15.5)
WBC: 7.3 10*3/uL (ref 4.0–10.5)
nRBC: 0 % (ref 0.0–0.2)

## 2019-03-03 LAB — ETHANOL: Alcohol, Ethyl (B): <10 mg/dL (ref <10)
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
## 2019-03-03 NOTE — ED Triage Notes (Signed)
Per EMS- patient went to [facility] for detox. [facility] called EMS to bring to the hospital. Patient told Beverly Sessions that he last used cocaine at 0130 today. EMS stated, IF labs are done we might can see him later."
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
## 2019-03-03 NOTE — ED Notes (Signed)
Pt ambulated to and from bathroom with steady gait. Pt changed into purple scrubs. Clothing placed in belongings bag - placed in [address] [place] at nursing station. Pt label placed on book bag, book bag placed in 9-12 [place] at nursing station.

## 2019-03-03 NOTE — BH Assessment (Signed)
[facility] Assessment Progress Note   Case was staffed with Bobby Rumpf NP who recommended patient be observed and monitored. This Probation officer will speak with [facility] to attempt to coordinate patient transfer to Observation Unit per Bobby Rumpf NP request.
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
## 2019-03-03 NOTE — ED Notes (Signed)
States has suicidal thoughts but no plan, will not answer any further questions on this subject

## 2019-03-03 NOTE — ED Notes (Signed)
Pt provided with turkey sandwich and sprite.  

## 2019-03-03 NOTE — ED Notes (Signed)
Pt wanded by security. 

## 2019-03-04 ENCOUNTER — Inpatient Hospital Stay (HOSPITAL_COMMUNITY)
Admission: AD | Admit: 2019-03-04 | Discharge: 2019-03-12 | DRG: 885 | Disposition: A | Discharge status: Home / Self Care | Payer: No Typology Code available for payment source | Source: Intra-hospital | Attending: Psychiatry | Admitting: Psychiatry

## 2019-03-04 ENCOUNTER — Observation Stay (HOSPITAL_COMMUNITY)
Admission: AD | Admit: 2019-03-04 | Discharge: 2019-03-04 | Disposition: A | Discharge status: Home / Self Care | Payer: Federal, State, Local not specified - Other | Source: Intra-hospital | Attending: Psychiatry | Admitting: Psychiatry

## 2019-03-04 ENCOUNTER — Other Ambulatory Visit: Payer: Self-pay

## 2019-03-04 ENCOUNTER — Encounter (HOSPITAL_COMMUNITY): Payer: Self-pay | Admitting: Psychiatry

## 2019-03-04 ENCOUNTER — Encounter (HOSPITAL_COMMUNITY): Payer: Self-pay | Admitting: Emergency Medicine

## 2019-03-04 DIAGNOSIS — Z833 Family history of diabetes mellitus: Secondary | ICD-10-CM | POA: Insufficient documentation

## 2019-03-04 DIAGNOSIS — G4700 Insomnia, unspecified: Secondary | ICD-10-CM | POA: Diagnosis present

## 2019-03-04 DIAGNOSIS — Z86718 Personal history of other venous thrombosis and embolism: Secondary | ICD-10-CM | POA: Diagnosis not present

## 2019-03-04 DIAGNOSIS — F319 Bipolar disorder, unspecified: Secondary | ICD-10-CM | POA: Diagnosis present

## 2019-03-04 DIAGNOSIS — F332 Major depressive disorder, recurrent severe without psychotic features: Secondary | ICD-10-CM | POA: Diagnosis present

## 2019-03-04 DIAGNOSIS — Z818 Family history of other mental and behavioral disorders: Secondary | ICD-10-CM | POA: Insufficient documentation

## 2019-03-04 DIAGNOSIS — I10 Essential (primary) hypertension: Secondary | ICD-10-CM | POA: Diagnosis present

## 2019-03-04 DIAGNOSIS — F17210 Nicotine dependence, cigarettes, uncomplicated: Secondary | ICD-10-CM | POA: Insufficient documentation

## 2019-03-04 DIAGNOSIS — Z7901 Long term (current) use of anticoagulants: Secondary | ICD-10-CM

## 2019-03-04 DIAGNOSIS — N289 Disorder of kidney and ureter, unspecified: Secondary | ICD-10-CM | POA: Diagnosis present

## 2019-03-04 DIAGNOSIS — F4310 Post-traumatic stress disorder, unspecified: Secondary | ICD-10-CM | POA: Diagnosis present

## 2019-03-04 DIAGNOSIS — F333 Major depressive disorder, recurrent, severe with psychotic symptoms: Secondary | ICD-10-CM | POA: Diagnosis not present

## 2019-03-04 DIAGNOSIS — Z79899 Other long term (current) drug therapy: Secondary | ICD-10-CM | POA: Insufficient documentation

## 2019-03-04 DIAGNOSIS — K589 Irritable bowel syndrome without diarrhea: Secondary | ICD-10-CM | POA: Diagnosis present

## 2019-03-04 DIAGNOSIS — R52 Pain, unspecified: Secondary | ICD-10-CM

## 2019-03-04 DIAGNOSIS — F323 Major depressive disorder, single episode, severe with psychotic features: Secondary | ICD-10-CM | POA: Diagnosis present

## 2019-03-04 DIAGNOSIS — F411 Generalized anxiety disorder: Secondary | ICD-10-CM | POA: Insufficient documentation

## 2019-03-04 DIAGNOSIS — E119 Type 2 diabetes mellitus without complications: Secondary | ICD-10-CM | POA: Diagnosis present

## 2019-03-04 DIAGNOSIS — F1424 Cocaine dependence with cocaine-induced mood disorder: Secondary | ICD-10-CM | POA: Diagnosis not present

## 2019-03-04 DIAGNOSIS — F1410 Cocaine abuse, uncomplicated: Secondary | ICD-10-CM | POA: Diagnosis present

## 2019-03-04 DIAGNOSIS — Z7984 Long term (current) use of oral hypoglycemic drugs: Secondary | ICD-10-CM | POA: Insufficient documentation

## 2019-03-04 DIAGNOSIS — R440 Auditory hallucinations: Secondary | ICD-10-CM | POA: Diagnosis present

## 2019-03-04 DIAGNOSIS — Z590 Homelessness: Secondary | ICD-10-CM | POA: Diagnosis not present

## 2019-03-04 DIAGNOSIS — Z886 Allergy status to analgesic agent status: Secondary | ICD-10-CM | POA: Insufficient documentation

## 2019-03-04 DIAGNOSIS — Z809 Family history of malignant neoplasm, unspecified: Secondary | ICD-10-CM | POA: Insufficient documentation

## 2019-03-04 DIAGNOSIS — R45851 Suicidal ideations: Secondary | ICD-10-CM | POA: Diagnosis present

## 2019-03-04 DIAGNOSIS — Z87820 Personal history of traumatic brain injury: Secondary | ICD-10-CM | POA: Insufficient documentation

## 2019-03-04 LAB — GLUCOSE, CAPILLARY
Glucose-Capillary: 88 mg/dL (ref 70–99)
Glucose-Capillary: 97 mg/dL (ref 70–99)
Glucose-Capillary: 88 mg/dL (ref 70–99)

## 2019-03-04 LAB — SARS CORONAVIRUS 2 BY RT PCR (HOSPITAL ORDER, PERFORMED IN ~~LOC~~ HOSPITAL LAB): SARS Coronavirus 2: NEGATIVE

## 2019-03-04 MED ORDER — ACETAMINOPHEN 325 MG PO TABS: 650.0000 mg | ORAL_TABLET | Freq: Four times a day (QID) | ORAL | Status: DC | PRN

## 2019-03-04 MED ORDER — MAGNESIUM HYDROXIDE 400 MG/5ML PO SUSP: 30.0000 mL | Freq: Every day | ORAL | Status: DC | PRN

## 2019-03-04 MED ORDER — ACETAMINOPHEN 325 MG PO TABS
650.0000 mg | ORAL_TABLET | Freq: Four times a day (QID) | ORAL | Status: DC | PRN
  Administered 2019-03-04 – 2019-03-12 (×14): 650 mg via ORAL
  Filled 2019-03-04 (×13): qty 2

## 2019-03-04 MED ORDER — ALUM & MAG HYDROXIDE-SIMETH 200-200-20 MG/5ML PO SUSP: 30.0000 mL | ORAL | Status: DC | PRN

## 2019-03-04 MED ORDER — HYDROXYZINE HCL 25 MG PO TABS
25.0000 mg | ORAL_TABLET | Freq: Three times a day (TID) | ORAL | Status: DC | PRN
  Administered 2019-03-04 – 2019-03-11 (×7): 25 mg via ORAL
  Filled 2019-03-04 (×9): qty 1

## 2019-03-04 MED ORDER — APIXABAN 2.5 MG PO TABS
2.5000 mg | ORAL_TABLET | Freq: Two times a day (BID) | ORAL | Status: DC
  Administered 2019-03-04 – 2019-03-12 (×16): 2.5 mg via ORAL
  Filled 2019-03-04 (×19): qty 1

## 2019-03-04 MED ORDER — GABAPENTIN 400 MG PO CAPS
800.0000 mg | ORAL_CAPSULE | Freq: Three times a day (TID) | ORAL | Status: DC
  Administered 2019-03-04: 800 mg via ORAL
  Filled 2019-03-04: qty 2

## 2019-03-04 MED ORDER — DOCUSATE SODIUM 100 MG PO CAPS
100.0000 mg | ORAL_CAPSULE | Freq: Two times a day (BID) | ORAL | Status: DC
  Administered 2019-03-04: 100 mg via ORAL
  Filled 2019-03-04: qty 1

## 2019-03-04 MED ORDER — TRAZODONE HCL 100 MG PO TABS
100.0000 mg | ORAL_TABLET | Freq: Every evening | ORAL | Status: DC | PRN
  Filled 2019-03-04: qty 1

## 2019-03-04 MED ORDER — DICYCLOMINE HCL 10 MG PO CAPS: 10.0000 mg | ORAL_CAPSULE | Freq: Four times a day (QID) | ORAL | Status: DC | PRN

## 2019-03-04 MED ORDER — HYDROXYZINE HCL 50 MG PO TABS: 50.0000 mg | ORAL_TABLET | Freq: Every evening | ORAL | Status: DC | PRN

## 2019-03-04 MED ORDER — CITALOPRAM HYDROBROMIDE 20 MG PO TABS
20.0000 mg | ORAL_TABLET | Freq: Every day | ORAL | Status: DC
  Administered 2019-03-04: 20 mg via ORAL
  Filled 2019-03-04: qty 1

## 2019-03-04 MED ORDER — APIXABAN 2.5 MG PO TABS
2.5000 mg | ORAL_TABLET | Freq: Two times a day (BID) | ORAL | Status: DC
  Administered 2019-03-04: 2.5 mg via ORAL
  Filled 2019-03-04 (×5): qty 1

## 2019-03-04 MED ORDER — PRAZOSIN HCL 1 MG PO CAPS: 2.0000 mg | ORAL_CAPSULE | Freq: Every day | ORAL | Status: DC

## 2019-03-04 MED ORDER — METFORMIN HCL 500 MG PO TABS
500.0000 mg | ORAL_TABLET | Freq: Every day | ORAL | Status: DC
  Administered 2019-03-04: 500 mg via ORAL
  Filled 2019-03-04: qty 1

## 2019-03-04 MED ORDER — METFORMIN HCL 500 MG PO TABS: 500.0000 mg | ORAL_TABLET | Freq: Every day | ORAL | 0 refills | Status: DC

## 2019-03-04 MED ORDER — OLANZAPINE 10 MG PO TABS: 20.0000 mg | ORAL_TABLET | Freq: Every day | ORAL | Status: DC

## 2019-03-04 MED ORDER — METFORMIN HCL 500 MG PO TABS
500.0000 mg | ORAL_TABLET | Freq: Every day | ORAL | Status: DC
  Administered 2019-03-05: 08:00:00 500 mg via ORAL
  Filled 2019-03-04 (×3): qty 1

## 2019-03-04 MED ORDER — MAGNESIUM OXIDE 400 (241.3 MG) MG PO TABS
400.0000 mg | ORAL_TABLET | Freq: Every day | ORAL | Status: DC
  Filled 2019-03-04 (×3): qty 1

## 2019-03-04 MED ORDER — HYDROXYZINE HCL 25 MG PO TABS: 25.0000 mg | ORAL_TABLET | Freq: Three times a day (TID) | ORAL | Status: DC | PRN

## 2019-03-04 MED ORDER — PANTOPRAZOLE SODIUM 40 MG PO TBEC
40.0000 mg | DELAYED_RELEASE_TABLET | Freq: Every day | ORAL | Status: DC
  Administered 2019-03-04: 40 mg via ORAL
  Filled 2019-03-04: qty 1

## 2019-03-04 NOTE — Plan of Care (Signed)
Newly admitted to the unit. Guarded and reserved during assessment. Reports that "I just need help...tired living like this".
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
## 2019-03-04 NOTE — Progress Notes (Signed)
Patient ID: Dale [name], male   DOB: 10/23/1965, 53 y.o.   MRN: [id number] Pt A&O x 4, presents with SI, no specific plan.  Pt reports he is tired of living this way on drugs.  Denies HI or AVH.  Pt very irritable, not forthcoming with information.  Skin search completed.  Monitoring for safety. No distress noted at present.

## 2019-03-04 NOTE — Progress Notes (Signed)
Patient was seen by the providers this morning. Currently upset and agitated, cursing, reporting that he does not want to be discharged, that he needs to be sent to a treatment services. You f* don't understand what I am going through, you keep discharging me and I need help, if you want me to kill myself I will..ibuprofen am tired of being treated like this". AC Chrys Racer was notified about this situation.

## 2019-03-04 NOTE — Tx Team (Signed)
Initial Treatment Plan 03/04/2019 4:37 PM Dale [name] XTA:[id number]    PATIENT STRESSORS: Financial difficulties Substance abuse   PATIENT STRENGTHS: Ability for insight Communication skills Motivation for treatment/growth   PATIENT IDENTIFIED PROBLEMS: Depression  Suicidal thoughts  Substance abuse                 DISCHARGE CRITERIA:  Adequate post-discharge living arrangements Improved stabilization in mood, thinking, and/or behavior Safe-care adequate arrangements made Verbal commitment to aftercare and medication compliance  PRELIMINARY DISCHARGE PLAN: Attend aftercare/continuing care group Attend 12-step recovery group Outpatient therapy  PATIENT/FAMILY INVOLVEMENT: This treatment plan has been presented to and reviewed with the patient, Dale [name], and/or family member.  The patient has been given the opportunity to ask questions and make suggestions.  Ronelle Nigh, RN 03/04/2019, 4:37 PM
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
## 2019-03-04 NOTE — Progress Notes (Signed)
Patient ID: Dale [name], male   DOB: Nov 27, 1965, 53 y.o.   MRN: [id number] Patient presents voluntarily complaining of suicidal ideations without any specific plan. Reports that he is struggling with drug addiction and is tired of this lifestyle. Patient not elaborating during admission (please refer to assessment note). Patient is guarded and defensive and can be arrogant. Frequently reporting that he needs treatment in a long term service. Patient is alert and oriented and denying hallucinations. He contracts for safety. Admitted to the Adult unit for evaluation and treatment.

## 2019-03-04 NOTE — Plan of Care (Signed)
[facility] Crisis Plan  Reason for Crisis Plan:  Crisis Stabilization   Plan of Care:  Referral for Inpatient Hospitalization  Family Support:      Current Living Environment:     Insurance:   Hospital Account    Name Acct ID Class Status Primary Coverage   Dale [name], Dale [name] [id number] [place]        Guarantor Account (for Hospital Account [id number])    Name Relation to Pt Service Area Active? Acct Type   Dale [name] Self [facility] Yes [facility]   Address Phone       [address] [place], [place] [address] [phone])          Coverage Information (for Hospital Account [id number])    F/O Payor/Plan Precert #   [place] #   Dale [name], Dale [name] [id number]   Address Phone   [address] [place], [place] [address] [phone]      Legal Guardian:     Primary Care Provider:  Patient, No Pcp Per  Current Outpatient Providers:  Dale Pilgrim, MD  Psychiatrist:     Counselor/Therapist:     Compliant with Medications:  No  Additional Information:   Dale [name] 11/29/20205:37 AM

## 2019-03-05 DIAGNOSIS — F1424 Cocaine dependence with cocaine-induced mood disorder: Secondary | ICD-10-CM

## 2019-03-05 DIAGNOSIS — F333 Major depressive disorder, recurrent, severe with psychotic symptoms: Secondary | ICD-10-CM

## 2019-03-05 MED ORDER — PRAZOSIN HCL 1 MG PO CAPS
1.0000 mg | ORAL_CAPSULE | Freq: Every day | ORAL | Status: DC
  Administered 2019-03-05: 1 mg via ORAL
  Filled 2019-03-05 (×4): qty 1

## 2019-03-05 MED ORDER — GABAPENTIN 100 MG PO CAPS
100.0000 mg | ORAL_CAPSULE | Freq: Three times a day (TID) | ORAL | Status: DC
  Administered 2019-03-05 – 2019-03-07 (×6): 100 mg via ORAL
  Filled 2019-03-05 (×10): qty 1

## 2019-03-05 MED ORDER — OLANZAPINE 5 MG PO TABS
5.0000 mg | ORAL_TABLET | Freq: Every day | ORAL | Status: DC
  Administered 2019-03-05: 5 mg via ORAL
  Filled 2019-03-05: qty 1
  Filled 2019-03-05: qty 2
  Filled 2019-03-05 (×2): qty 1

## 2019-03-05 MED ORDER — CITALOPRAM HYDROBROMIDE 10 MG PO TABS
10.0000 mg | ORAL_TABLET | Freq: Every day | ORAL | Status: DC
  Administered 2019-03-06: 09:00:00 10 mg via ORAL
  Filled 2019-03-05 (×3): qty 1

## 2019-03-05 NOTE — Progress Notes (Signed)
Patient Self Inventory Sheet, Pt reports fair appetite, energy low, and poor sleeping pattern. Pt rates his depression,  hopelessness, and anxiety all at 10 out of 10. Pt states he has SI sometimes without  plans. Pt reports his physical problems include lightheaded and  pain at a 10 out of 10. His pain is located in the right foot .  Pt states his most important goal is to learn to talk about his problems.  Medication given as Rx, Safety maintained with 15 minute checks

## 2019-03-05 NOTE — Progress Notes (Signed)
Spiritual care group on grief and loss facilitated by chaplain Jerene Pitch MDiv, BCC  Group Goal:  Support / Education around grief and loss Members engage in facilitated group support and psycho-social education.  Group Description:  Following introductions and group rules, group members engaged in facilitated group dialog and support around topic of loss, with particular support around experiences of loss in their lives. Group Identified types of loss (relationships / self / things) and identified patterns, circumstances, and changes that precipitate losses. Reflected on thoughts / feelings around loss, normalized grief responses, and recognized variety in grief experience.   Group noted Worden's four tasks of grief in discussion.  Group drew on Adlerian / Rogerian, narrative, MI, Patient Progress:  Present throughout group.  Presented with flat affect and appeared lethargic.  Did not engage in group conversation.

## 2019-03-05 NOTE — Tx Team (Signed)
Interdisciplinary Treatment and Diagnostic Plan Update  03/05/2019 Time of Session: 9:30am Dale [name] MRN: [id number]  Principal Diagnosis: <principal problem not specified>  Secondary Diagnoses: Active Problems:   MDD (major depressive disorder), recurrent episode, severe (HCC)   Current Medications:  Current Facility-Administered Medications  Medication Dose Route Frequency Provider Last Rate Last Dose  . acetaminophen (TYLENOL) tablet 650 mg  650 mg Oral Q6H PRN Suella Broad, FNP   650 mg at 03/05/19 0831  . alum & mag hydroxide-simeth (MAALOX/MYLANTA) 200-200-20 MG/5ML suspension 30 mL  30 mL Oral Q4H PRN Burt Ek, Gayland Curry, FNP      . apixaban (ELIQUIS) tablet 2.5 mg  2.5 mg Oral BID Suella Broad, FNP   2.5 mg at 03/05/19 0827  . hydrOXYzine (ATARAX/VISTARIL) tablet 25 mg  25 mg Oral TID PRN Suella Broad, FNP   25 mg at 03/04/19 2116  . magnesium hydroxide (MILK OF MAGNESIA) suspension 30 mL  30 mL Oral Daily PRN Starkes-Perry, Gayland Curry, FNP      . metFORMIN (GLUCOPHAGE) tablet 500 mg  500 mg Oral Q breakfast Suella Broad, FNP   500 mg at 03/05/19 0827  . traZODone (DESYREL) tablet 100 mg  100 mg Oral QHS PRN Suella Broad, FNP       PTA Medications: Medications Prior to Admission  Medication Sig Dispense Refill Last Dose  . apixaban (ELIQUIS) 2.5 MG TABS tablet Take 1 tablet (2.5 mg total) by mouth 2 (two) times daily. 60 tablet 1   . metFORMIN (GLUCOPHAGE) 500 MG tablet Take 1 tablet (500 mg total) by mouth daily with breakfast. 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   Physician Treatment Plan for Primary Diagnosis: <principal problem not  specified> Long Term Goal(s):     Short Term Goals:    Medication Management: Evaluate patient's response, side effects, and tolerance of medication regimen.  Therapeutic Interventions: 1 to 1 sessions, Unit Group sessions and Medication administration.  Evaluation of Outcomes: Not Met  Physician Treatment Plan for Secondary Diagnosis: Active Problems:   MDD (major depressive disorder), recurrent episode, severe ([place])  Long Term Goal(s):     Short Term Goals:       Medication Management: Evaluate patient's response, side effects, and tolerance of medication regimen.  Therapeutic Interventions: 1 to 1 sessions, Unit Group sessions and Medication administration.  Evaluation of Outcomes: Not Met   RN Treatment Plan for Primary Diagnosis: <principal problem not specified> Long Term Goal(s): Knowledge of disease and therapeutic regimen to maintain health will improve  Short Term Goals: Ability to demonstrate self-control, Ability to participate in decision making will improve, Ability to identify and develop effective coping behaviors will improve and Compliance with prescribed medications will improve  Medication Management: RN will administer medications as ordered by provider, will assess and evaluate patient's response and provide education to patient for prescribed medication. RN will report any adverse and/or side effects to prescribing provider.  Therapeutic Interventions: 1 on 1 counseling sessions, Psychoeducation, Medication administration, Evaluate responses to treatment, Monitor vital signs and CBGs as ordered, Perform/monitor CIWA, COWS, AIMS and Fall Risk screenings as ordered, Perform wound care treatments as ordered.  Evaluation of Outcomes: Not Met   LCSW Treatment Plan for Primary Diagnosis: <principal problem not specified> Long Term Goal(s):  Safe transition to appropriate next level of care at discharge, Engage patient in therapeutic group addressing interpersonal  concerns.  Short Term Goals: Engage patient in aftercare planning with referrals and resources  Therapeutic Interventions: Assess for all discharge needs, 1 to 1 time with Social worker, Explore available resources and support systems, Assess for adequacy in community support network, Educate family and significant other(s) on suicide prevention, Complete Psychosocial Assessment, Interpersonal group therapy.  Evaluation of Outcomes: Not Met   Progress in Treatment: Attending groups: No. Participating in groups: No. Taking medication as prescribed: Yes. Toleration medication: Yes. Family/Significant other contact made: No, will contact:  if patient consents to collateral contacts Patient understands diagnosis: Yes. Discussing patient identified problems/goals with staff: Yes. Medical problems stabilized or resolved: Yes. Denies suicidal/homicidal ideation: No. Issues/concerns per patient self-inventory: No. Other:   New problem(s) identified:   New Short Term/Long Term Goal(s):Detox, medication stabilization, elimination of SI thoughts, development of comprehensive mental wellness plan.    Patient Goals: "Get myself on the right track and to be successful"    Discharge Plan or Barriers: Patient recently admitted. CSW will continue to follow and assess for appropriate referrals and possible discharge planning.    Reason for Continuation of Hospitalization: Anxiety Depression Medication stabilization Suicidal ideation  Estimated Length of Stay: 3-5 days   Attendees: Patient: Dale [name]  03/05/2019 10:40 AM  Physician: Dr. Neita Garnet, MD 03/05/2019 10:40 AM  Nursing: Rise Paganini.Raliegh Ip, RN 03/05/2019 10:40 AM  RN Care Manager: 03/05/2019 10:40 AM  Social Worker: Radonna Ricker, LCSW 03/05/2019 10:40 AM  Recreational Therapist:  03/05/2019 10:40 AM  Other: Harriett Sine, NP 03/05/2019 10:40 AM  Other:  03/05/2019 10:40 AM  Other: 03/05/2019 10:40 AM    Scribe for Treatment  Team: Marylee Floras, [place] 03/05/2019 10:40 AM

## 2019-03-05 NOTE — Progress Notes (Signed)
D.  Pt in dayroom on approach, rated pain in right ankle 10/10.  Pt stated that his ankle brace had been taken during admission due to having shoe strings but that he is unable to ambulate without it.  Writer spoke with [facility] and NP and order received for patient to have ankle brace.  Tylenol given for pain as ordered (see pain flowsheet)  Pt stated that his ankle did feel better after this but did still rate the pain a high.  Pt is a high fall risk.  Pt was positive for evening wrap up group, observed engaged minimally but appropriately with peers on the unit.  Pt did endorse passive SI but denied HI/AVH at this time.  He does agree to safety on the unit.  A.  Support and encouragement offered, asked MHT to adjust bed for patient's comfort as much as possible.  Medications given as ordered  R . Pt remains safe on the unit, will continue to monitor.

## 2019-03-05 NOTE — Progress Notes (Signed)
Adult Psychoeducational Group Note  Date:  03/05/2019 Time:  8:19 AM  Group Topic/Focus:  Wrap-Up Group:   The focus of this group is to help patients review their daily goal of treatment and discuss progress on daily workbooks.  Participation Level:  Active  Participation Quality:  Appropriate  Affect:  Appropriate  Cognitive:  Appropriate  Insight: Appropriate  Engagement in Group:  Engaged  Modes of Intervention:  Discussion  Additional Comments:  Pt attend wrap up group. His day was a 6. His goal for today get some help and he accomplished his goal. No other questions. Lenice Llamas Long 03/05/2019, 8:19 AM

## 2019-03-05 NOTE — Progress Notes (Signed)
Patient rated his day as a 3 or 4 out of a possible 10. He is presently complaining of ankle pain which is causing him quite a bit of discomfort. His goal for tomorrow is to have all of his medications restarted.
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
## 2019-03-05 NOTE — Progress Notes (Signed)
[place] NOVEL CORONAVIRUS (COVID-19) DAILY CHECK-OFF SYMPTOMS - answer yes or no to each - every day NO YES  Have you had a fever in the past 24 hours?  . Fever (Temp > 37.80C / 100F) X   Have you had any of these symptoms in the past 24 hours? . New Cough .  Sore Throat  .  Shortness of Breath .  Difficulty Breathing .  Unexplained Body Aches   X   Have you had any one of these symptoms in the past 24 hours not related to allergies?   . Runny Nose .  Nasal Congestion .  Sneezing   X   If you have had runny nose, nasal congestion, sneezing in the past 24 hours, has it worsened?  X   EXPOSURES - check yes or no X   Have you traveled outside the state in the past 14 days?  X   Have you been in contact with someone with a confirmed diagnosis of COVID-19 or PUI in the past 14 days without wearing appropriate PPE?  X   Have you been living in the same home as a person with confirmed diagnosis of COVID-19 or a PUI (household contact)?    X   Have you been diagnosed with COVID-19?    X              What to do next: Answered NO to all: Answered YES to anything:   Proceed with unit schedule Follow the [facility] Inpatient Flowsheet.   

## 2019-03-05 NOTE — [facility] Group Notes (Signed)
LCSW Group Therapy Note 03/05/2019 2:31 PM  Type of Therapy and Topic: Group Therapy: Overcoming Obstacles  Participation Level: Did Not Attend  Description of Group:  In this group patients will be encouraged to explore what they see as obstacles to their own wellness and recovery. They will be guided to discuss their thoughts, feelings, and behaviors related to these obstacles. The group will process together ways to cope with barriers, with attention given to specific choices patients can make. Each patient will be challenged to identify changes they are motivated to make in order to overcome their obstacles. This group will be process-oriented, with patients participating in exploration of their own experiences as well as giving and receiving support and challenge from other group members.  Therapeutic Goals: 1. Patient will identify personal and current obstacles as they relate to admission. 2. Patient will identify barriers that currently interfere with their wellness or overcoming obstacles.  3. Patient will identify feelings, thought process and behaviors related to these barriers. 4. Patient will identify two changes they are willing to make to overcome these obstacles:   Summary of Patient Progress  Invited, chose not to attend.    Therapeutic Modalities:  Cognitive Behavioral Therapy Solution Focused Therapy Motivational Interviewing Relapse Prevention Therapy   Theresa Duty Clinical Social Worker

## 2019-03-05 NOTE — Progress Notes (Signed)
Recreation Therapy Notes  Date:  11.30.20 Time: 0930 Location: [address] Dayroom  Group Topic: Stress Management  Goal Area(s) Addresses:  Patient will identify positive stress management techniques. Patient will identify benefits of using stress management post d/c.  Intervention: Stress Management  Activity : Meditation.  LRT played a meditation that focused on making choices.  Patients were to listen and follow along as meditation played to engage in activity.  Education:  Stress Management, Discharge Planning.   Education Outcome: Acknowledges Education  Clinical Observations/Feedback:  Pt did not attend group.    Judea Fennimore, LRT/CTRS         Nkenge Sonntag A 03/05/2019 11:16 AM 

## 2019-03-06 MED ORDER — DOCUSATE SODIUM 100 MG PO CAPS
100.0000 mg | ORAL_CAPSULE | Freq: Every day | ORAL | Status: DC | PRN
  Administered 2019-03-07: 100 mg via ORAL
  Filled 2019-03-06: qty 1

## 2019-03-06 MED ORDER — ENSURE ENLIVE PO LIQD
237.0000 mL | Freq: Two times a day (BID) | ORAL | Status: DC
  Administered 2019-03-06 – 2019-03-11 (×10): 237 mL via ORAL

## 2019-03-06 MED ORDER — PANTOPRAZOLE SODIUM 40 MG PO TBEC
40.0000 mg | DELAYED_RELEASE_TABLET | Freq: Every day | ORAL | Status: DC
  Administered 2019-03-06 – 2019-03-12 (×7): 40 mg via ORAL
  Filled 2019-03-06 (×9): qty 1

## 2019-03-06 MED ORDER — CITALOPRAM HYDROBROMIDE 20 MG PO TABS
20.0000 mg | ORAL_TABLET | Freq: Every day | ORAL | Status: DC
  Administered 2019-03-07 – 2019-03-12 (×6): 20 mg via ORAL
  Filled 2019-03-06 (×9): qty 1

## 2019-03-06 MED ORDER — DICYCLOMINE HCL 10 MG PO CAPS
10.0000 mg | ORAL_CAPSULE | Freq: Three times a day (TID) | ORAL | Status: DC
  Administered 2019-03-06 – 2019-03-12 (×17): 10 mg via ORAL
  Filled 2019-03-06 (×21): qty 1

## 2019-03-06 MED ORDER — OLANZAPINE 10 MG PO TABS
10.0000 mg | ORAL_TABLET | Freq: Every day | ORAL | Status: DC
  Administered 2019-03-06 – 2019-03-10 (×5): 10 mg via ORAL
  Filled 2019-03-06 (×7): qty 1

## 2019-03-06 MED ORDER — PRAZOSIN HCL 2 MG PO CAPS
2.0000 mg | ORAL_CAPSULE | Freq: Every day | ORAL | Status: DC
  Administered 2019-03-06 – 2019-03-07 (×2): 2 mg via ORAL
  Filled 2019-03-06 (×3): qty 1
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
## 2019-03-06 NOTE — Progress Notes (Signed)
   03/04/19 2305  Psych Admission Type (Psych Patients Only)  Admission Status Voluntary  Psychosocial Assessment  Patient Complaints Anxiety;Agitation;Depression  Eye Contact Fair  Facial Expression Anxious;Pained  Affect Anxious;Appropriate to circumstance  Speech Logical/coherent  Interaction Assertive  Motor Activity Unsteady (Pt wearing ankle brace on right ankle, injured three weeks ago)  Appearance/Hygiene In scrubs  Behavior Characteristics Appropriate to situation  Mood Depressed;Labile  Thought Process  Coherency WDL  Content WDL  Delusions None reported or observed  Perception WDL  Hallucination None reported or observed  Judgment Poor  Confusion None  Danger to Self  Current suicidal ideation? Denies  Self-Injurious Behavior No self-injurious ideation or behavior indicators observed or expressed   Agreement Not to Harm Self Yes  Description of Agreement verbal  Danger to Others  Danger to Others None reported or observed  Danger to Others Abnormal  Harmful Behavior to others No threats or harm toward other people  Destructive Behavior No threats or harm toward property

## 2019-03-06 NOTE — Plan of Care (Signed)
Nurse discussed depression, anxiety, coping skills with patient.  

## 2019-03-06 NOTE — Progress Notes (Signed)
D:  Patient's self inventory sheet, patient has poor sleep, no sleep medication.  Poor appetite, low energy level, poor concentration.  Rated depression and hopeless 8.  Rated anxiety 9.  Withdrawals.  SI, off/on, contracts for safety.  Physical problems, lightheaded, pain.  Physical pain, R foot, pain #10.  Pain medicine not helpful.  Goal is myself.  Plans to open up.  No discharge plans. A:  Medications administered per MD orders.  Emotional support and encouragement given patient. R:  Denied Si and HI, contracts for safety, while talking to nurse this morning.  Denied A/V hallucinations.  Safety maintained with 15 minute checks.
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
## 2019-03-06 NOTE — Progress Notes (Signed)
Patient requesting referrals for residential treatment. CSW has contacted the following facilties to determine bed availability:  [facility] - no available beds, patient will be placed on wait list  [facility] - no available beds, patient will be placed on wait list [facility] - no available beds, patient will be placed on wait list [facility] - no available beds until after Christmas holiday. No wait list option.    CSW will continue to explore alternative placement options.    Radonna Ricker, MSW, LCSW Clinical Social Worker [facility]  Phone: [phone]

## 2019-03-07 MED ORDER — GABAPENTIN 300 MG PO CAPS
300.0000 mg | ORAL_CAPSULE | Freq: Four times a day (QID) | ORAL | Status: DC
  Administered 2019-03-07 (×2): 300 mg via ORAL
  Filled 2019-03-07 (×7): qty 1

## 2019-03-07 NOTE — Progress Notes (Signed)
Patient ID: Dale [name], male   DOB: 02/16/1966, 53 y.o.   MRN: [id number]   NOVEL CORONAVIRUS (COVID-19) DAILY CHECK-OFF SYMPTOMS - answer yes or no to each - every day NO YES  Have you had a fever in the past 24 hours?  . Fever (Temp > 37.80C / 100F) X   Have you had any of these symptoms in the past 24 hours? . New Cough .  Sore Throat  .  Shortness of Breath .  Difficulty Breathing .  Unexplained Body Aches   X   Have you had any one of these symptoms in the past 24 hours not related to allergies?   . Runny Nose .  Nasal Congestion .  Sneezing   X   If you have had runny nose, nasal congestion, sneezing in the past 24 hours, has it worsened?  X   EXPOSURES - check yes or no X   Have you traveled outside the state in the past 14 days?  X   Have you been in contact with someone with a confirmed diagnosis of COVID-19 or PUI in the past 14 days without wearing appropriate PPE?  X   Have you been living in the same home as a person with confirmed diagnosis of COVID-19 or a PUI (household contact)?    X   Have you been diagnosed with COVID-19?    X              What to do next: Answered NO to all: Answered YES to anything:   Proceed with unit schedule Follow the [facility] Inpatient Flowsheet.   

## 2019-03-07 NOTE — Progress Notes (Signed)
   03/07/19 0030  Psych Admission Type (Psych Patients Only)  Admission Status Voluntary  Psychosocial Assessment  Patient Complaints Anxiety  Eye Contact Fair  Facial Expression Anxious;Pained  Affect Anxious;Appropriate to circumstance  Speech Logical/coherent  Interaction Assertive  Motor Activity Unsteady (Pt wearing ankle brace on right ankle, injured three weeks ago)  Appearance/Hygiene In scrubs  Behavior Characteristics Cooperative  Mood Depressed  Thought Process  Coherency WDL  Content WDL  Delusions None reported or observed  Perception WDL  Hallucination None reported or observed  Judgment Poor  Confusion None  Danger to Self  Current suicidal ideation? Denies  Self-Injurious Behavior No self-injurious ideation or behavior indicators observed or expressed   Agreement Not to Harm Self Yes  Description of Agreement verbal  Danger to Others  Danger to Others None reported or observed  Danger to Others Abnormal  Harmful Behavior to others No threats or harm toward other people  Destructive Behavior No threats or harm toward property
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
## 2019-03-07 NOTE — Plan of Care (Signed)
  Problem: Coping: Goal: Ability to demonstrate self-control will improve Outcome: Progressing   Problem: Safety: Goal: Periods of time without injury will increase Outcome: Progressing   

## 2019-03-07 NOTE — [facility] Group Notes (Signed)
Type of Therapy and Topic: Group Therapy: Trust and Honesty  Participation Level: Minimal   Description of Group:  In this group patients will be asked to explore the value of being honest. Patients will be guided to discuss their thoughts, feelings, and behaviors related to honesty and trusting in others. Patients will process together how trust and honesty relate to forming relationships with peers, family members, and self. Each patient will be challenged to identify and express feelings of being vulnerable. Patients will discuss reasons why people are dishonest and identify alternative outcomes if one was truthful (to self or others). This group will be process-oriented, with patients participating in exploration of their own experiences, giving and receiving support, and processing challenge from other group members.  Therapeutic Goals: 1. Patient will identify why honesty is important to relationships and how honesty overall affects relationships. 2. Patient will identify a situation where they lied or were lied too and the feelings, thought process, and behaviors surrounding the situation 3. Patient will identify the meaning of being vulnerable, how that feels, and how that correlates to being honest with self and others. 4. Patient will identify situations where they could have told the truth, but instead lied and explain reasons of dishonesty.  Summary of Patient Progress:  Dale [name] was asleep during majority of the group session. He did state that "lying to people cause ore harm in the end".    Therapeutic Modalities:  Cognitive Behavioral Therapy Solution Focused Therapy Motivational Interviewing Brief Therapy  Radonna Ricker, MSW, LCSW Clinical Social Worker [employer]  Phone: [phone]

## 2019-03-07 NOTE — Progress Notes (Signed)
CSW called to follow up with a referral to [facility].   This patient was accepted for residential treatment on Monday, 12/07.   Patient needs to arrive at 10am and bring a 14 day supply of medication.  Stephanie Acre, MSW, [place] Social Worker [facility]  [phone]

## 2019-03-08 LAB — BASIC METABOLIC PANEL
Anion gap: 8 (ref 5–15)
BUN: 15 mg/dL (ref 6–20)
CO2: 23 mmol/L (ref 22–32)
Calcium: 9.1 mg/dL (ref 8.9–10.3)
Chloride: 110 mmol/L (ref 98–111)
Creatinine, Ser: 1.09 mg/dL (ref 0.61–1.24)
GFR calc Af Amer: >60 mL/min (ref >60)
GFR calc non Af Amer: >60 mL/min (ref >60)
Glucose, Bld: 154 mg/dL — ABNORMAL HIGH (ref 70–99)
Potassium: 4.2 mmol/L (ref 3.5–5.1)
Sodium: 141 mmol/L (ref 135–145)

## 2019-03-08 MED ORDER — PRAZOSIN HCL 1 MG PO CAPS
4.0000 mg | ORAL_CAPSULE | Freq: Every day | ORAL | Status: DC
  Administered 2019-03-08 – 2019-03-11 (×4): 4 mg via ORAL
  Filled 2019-03-08 (×5): qty 2

## 2019-03-08 MED ORDER — GABAPENTIN 300 MG PO CAPS
300.0000 mg | ORAL_CAPSULE | Freq: Three times a day (TID) | ORAL | Status: DC
  Administered 2019-03-08 – 2019-03-12 (×11): 300 mg via ORAL
  Filled 2019-03-08 (×14): qty 1

## 2019-03-08 MED ORDER — GABAPENTIN 300 MG PO CAPS
300.0000 mg | ORAL_CAPSULE | Freq: Four times a day (QID) | ORAL | Status: DC
  Administered 2019-03-08: 300 mg via ORAL
  Filled 2019-03-08 (×5): qty 1
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
## 2019-03-08 NOTE — [facility] Group Notes (Signed)
Adult Psychoeducational Group Note  Date:  03/08/2019 Time:  10:09 PM  Group Topic/Focus:  Wrap-Up Group:   The focus of this group is to help patients review their daily goal of treatment and discuss progress on daily workbooks.  Participation Level:  Active  Participation Quality:  Appropriate and Attentive  Affect:  Irritable  Cognitive:  Alert and Appropriate  Insight: Appropriate and Good  Engagement in Group:  Engaged  Modes of Intervention:  Discussion, Education and Support  Additional Comments:  Pt attended and participated in wrap up group this evening and rated their day a 6/10. Pt has been in a lot of pain, and has not received pain meds prescribed. Pt completed their goal, which was to have positive thinking.   Cristi Loron 03/08/2019, 10:09 PM

## 2019-03-08 NOTE — Progress Notes (Signed)
Patient denied SI and HI, contracts for safety.  Denied A/V hallucinations.   Medications administered per MD orders.  Emotional support and encouragement given patient. Safety maintained with 15 minute checks.  

## 2019-03-08 NOTE — Progress Notes (Signed)
   03/08/19 0024  Psych Admission Type (Psych Patients Only)  Admission Status Voluntary  Psychosocial Assessment  Patient Complaints Depression;Sadness  Eye Contact Brief  Facial Expression Flat  Affect Appropriate to circumstance  Speech Logical/coherent  Interaction Minimal  Appearance/Hygiene Unremarkable  Mood Depressed;Sad  Thought Process  Coherency WDL  Content WDL  Delusions WDL  Perception WDL  Hallucination None reported or observed  Judgment WDL  Confusion WDL  Danger to Self  Current suicidal ideation? Passive  Agreement Not to Harm Self Yes  Danger to Others  Danger to Others None reported or observed  Reports mood has not changed since admission.

## 2019-03-09 MED ORDER — FLUTICASONE PROPIONATE 50 MCG/ACT NA SUSP
2.0000 | Freq: Every day | NASAL | Status: DC
  Administered 2019-03-10 – 2019-03-12 (×3): 2 via NASAL
  Filled 2019-03-09 (×2): qty 16

## 2019-03-09 MED ORDER — SALINE SPRAY 0.65 % NA SOLN
2.0000 | NASAL | Status: DC | PRN
  Administered 2019-03-09: 2 via NASAL
  Filled 2019-03-09: qty 44

## 2019-03-09 NOTE — Progress Notes (Signed)
Patient ID: Dale [name], male   DOB: Jan 28, 1966, 52 y.o.   MRN: [id number] Nursing reports that patient has a temp of 99.5 and that patient is reporting nasal stuffiness/discharge. On exam patient reports nasal dryness x 2 days and states that when he blew his nose this morning he had some bloody discharge. Patient denies nasal drainage otherwise. Denies cough, shortness of breath, sore throat, and ear pain. No maxillary or frontal sinus tenderness upon palpation. No head or cervical lymphadenopathy.   Placed orders for saline spray and Flonase.    Physical Exam  Constitutional: He is oriented to person, place, and time and well-developed, well-nourished, and in no distress. No distress.  HENT:  Nose: Right sinus exhibits no maxillary sinus tenderness and no frontal sinus tenderness. Left sinus exhibits no maxillary sinus tenderness and no frontal sinus tenderness.  Mouth/Throat: No oropharyngeal exudate, posterior oropharyngeal edema, posterior oropharyngeal erythema or tonsillar abscesses.  Eyes: Pupils are equal, round, and reactive to light. Right eye exhibits no discharge. Left eye exhibits no discharge.  Pulmonary/Chest: Effort normal. No respiratory distress.  Lymphadenopathy:       Head (right side): No submental, no submandibular, no tonsillar, no preauricular and no posterior auricular adenopathy present.       Head (left side): No submental, no submandibular, no tonsillar, no preauricular and no posterior auricular adenopathy present.    He has no cervical adenopathy.  Neurological: He is alert and oriented to person, place, and time.  Skin: He is not diaphoretic.    Review of Systems  Constitutional: Positive for fever. Negative for chills.  HENT: Positive for congestion and nosebleeds. Negative for sinus pain and sore throat.   Respiratory: Negative for cough and shortness of breath.   Cardiovascular: Negative for chest pain.  Gastrointestinal: Negative for diarrhea, nausea and  vomiting.

## 2019-03-09 NOTE — Progress Notes (Signed)
Recreation Therapy Notes  Date: 12.4.20 Time: 0930 Location: 300 Hall Dayroom  Group Topic: Stress Management  Goal Area(s) Addresses:  Patient will identify positive stress management techniques. Patient will identify benefits of using stress management post d/c.  Intervention: Stress Management  Activity :  Meditation.  LRT played a meditation that focused on making the most of your day.  Patients were to listen and follow along with the meditation as it played to engage in the meditation.  Education:  Stress Management, Discharge Planning.   Education Outcome: Acknowledges Education  Clinical Observations/Feedback: Pt did not attend activity.    Victorino Sparrow, LRT/CTRS        Victorino Sparrow A 03/09/2019 11:16 AM

## 2019-03-09 NOTE — Progress Notes (Signed)
   03/09/19 0012  Psych Admission Type (Psych Patients Only)  Admission Status Voluntary  Psychosocial Assessment  Patient Complaints Other (Comment) (pain)  Eye Contact Brief  Facial Expression Flat  Affect Appropriate to circumstance  Speech Logical/coherent  Interaction Minimal  Motor Activity Unsteady  Appearance/Hygiene Unremarkable  Behavior Characteristics Cooperative  Mood Depressed  Thought Process  Coherency WDL  Content WDL  Delusions None reported or observed  Perception WDL  Hallucination None reported or observed  Judgment Impaired  Confusion None  Danger to Self  Current suicidal ideation? Denies  Self-Injurious Behavior No self-injurious ideation or behavior indicators observed or expressed   Agreement Not to Harm Self Yes  Description of Agreement  (contracts for safety verbal)  Danger to Others  Danger to Others None reported or observed  D: Patient in dayroom watching TV. Pt reports he had a good day.  A: Medications administered as prescribed. Support and encouragement provided as needed.  R: Pt engaged in evening wrap up group. Patient remains safe on the unit. Will continue to monitor for safety and stability.

## 2019-03-09 NOTE — Plan of Care (Signed)
  Problem: Activity: Goal: Interest or engagement in activities will improve Outcome: Progressing   Problem: Coping: Goal: Ability to demonstrate self-control will improve Outcome: Progressing   Problem: Health Behavior/Discharge Planning: Goal: Compliance with treatment plan for underlying cause of condition will improve Outcome: Progressing   Problem: Safety: Goal: Periods of time without injury will increase Outcome: Progressing   
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
## 2019-03-09 NOTE — Tx Team (Signed)
Interdisciplinary Treatment and Diagnostic Plan Update  03/09/2019 Time of Session: 9:30am Hau Sanor MRN: [id number]  Principal Diagnosis: <principal problem not specified>  Secondary Diagnoses: Active Problems:   MDD (major depressive disorder), recurrent episode, severe (HCC)   Current Medications:  Current Facility-Administered Medications  Medication Dose Route Frequency Provider Last Rate Last Dose  . acetaminophen (TYLENOL) tablet 650 mg  650 mg Oral Q6H PRN Suella Broad, FNP   650 mg at 03/08/19 2151  . alum & mag hydroxide-simeth (MAALOX/MYLANTA) 200-200-20 MG/5ML suspension 30 mL  30 mL Oral Q4H PRN Burt Ek, Gayland Curry, FNP      . apixaban (ELIQUIS) tablet 2.5 mg  2.5 mg Oral BID Suella Broad, FNP   2.5 mg at 03/09/19 9485  . citalopram (CELEXA) tablet 20 mg  20 mg Oral Daily Sharma Covert, MD   20 mg at 03/09/19 4627  . dicyclomine (BENTYL) capsule 10 mg  10 mg Oral TID AC Sharma Covert, MD   10 mg at 03/09/19 0618  . docusate sodium (COLACE) capsule 100 mg  100 mg Oral Daily PRN Sharma Covert, MD   100 mg at 03/07/19 1640  . feeding supplement (ENSURE ENLIVE) (ENSURE ENLIVE) liquid 237 mL  237 mL Oral BID BM Connye Burkitt, NP   237 mL at 03/09/19 0823  . gabapentin (NEURONTIN) capsule 300 mg  300 mg Oral TID Sharma Covert, MD   300 mg at 03/09/19 0350  . hydrOXYzine (ATARAX/VISTARIL) tablet 25 mg  25 mg Oral TID PRN Suella Broad, FNP   25 mg at 03/06/19 2116  . magnesium hydroxide (MILK OF MAGNESIA) suspension 30 mL  30 mL Oral Daily PRN Suella Broad, FNP      . OLANZapine (ZYPREXA) tablet 10 mg  10 mg Oral QHS Sharma Covert, MD   10 mg at 03/08/19 2151  . pantoprazole (PROTONIX) EC tablet 40 mg  40 mg Oral Daily Sharma Covert, MD   40 mg at 03/09/19 0938  . prazosin (MINIPRESS) capsule 4 mg  4 mg Oral QHS Sharma Covert, MD   4 mg at 03/08/19 2150   PTA Medications: Medications Prior to Admission   Medication Sig Dispense Refill Last Dose  . apixaban (ELIQUIS) 2.5 MG TABS tablet Take 1 tablet (2.5 mg total) by mouth 2 (two) times daily. 60 tablet 1   . metFORMIN (GLUCOPHAGE) 500 MG tablet Take 1 tablet (500 mg total) by mouth daily with breakfast. 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   Physician Treatment Plan for Primary Diagnosis: <principal problem not specified> Long Term Goal(s): Improvement in symptoms so as ready for discharge Improvement in symptoms so as ready for discharge   Short Term Goals: Ability to identify changes in lifestyle to reduce recurrence of condition will improve Ability to verbalize feelings will improve Ability to disclose and discuss suicidal ideas Ability to demonstrate self-control will improve Ability to identify and develop effective coping behaviors will improve Ability to maintain clinical measurements within normal limits will improve Compliance with prescribed medications will improve Ability to identify changes in lifestyle to reduce recurrence of condition will improve Ability to identify triggers associated with substance abuse/mental health issues will improve  Medication Management: Evaluate patient's response, side effects, and tolerance  of medication regimen.  Therapeutic Interventions: 1 to 1 sessions, Unit Group sessions and Medication administration.  Evaluation of Outcomes: Adequate for Discharge  Physician Treatment Plan for Secondary Diagnosis: Active Problems:   MDD (major depressive disorder), recurrent episode, severe (HCC)  Long Term Goal(s): Improvement in symptoms so as ready for discharge Improvement in symptoms so as ready for discharge    Short Term Goals: Ability to identify changes in lifestyle to reduce recurrence of condition will improve Ability to verbalize feelings will improve Ability to disclose and discuss suicidal ideas Ability to demonstrate self-control will improve Ability to identify and develop effective coping behaviors will improve Ability to maintain clinical measurements within normal limits will improve Compliance with prescribed medications will improve Ability to identify changes in lifestyle to reduce recurrence of condition will improve Ability to identify triggers associated with substance abuse/mental health issues will improve     Medication Management: Evaluate patient's response, side effects, and tolerance of medication regimen.  Therapeutic Interventions: 1 to 1 sessions, Unit Group sessions and Medication administration.  Evaluation of Outcomes: Adequate for Discharge   RN Treatment Plan for Primary Diagnosis: <principal problem not specified> Long Term Goal(s): Knowledge of disease and therapeutic regimen to maintain health will improve  Short Term Goals: Ability to demonstrate self-control, Ability to participate in decision making will improve, Ability to identify and develop effective coping behaviors will improve and Compliance with prescribed medications will improve  Medication Management: RN will administer medications as ordered by provider, will assess and evaluate patient's response and provide education to patient for prescribed medication. RN will report any adverse and/or side effects to prescribing provider.  Therapeutic Interventions: 1 on 1 counseling sessions, Psychoeducation, Medication administration, Evaluate responses to treatment, Monitor vital signs and CBGs as ordered, Perform/monitor CIWA, COWS, AIMS and Fall Risk screenings as ordered, Perform wound care treatments as ordered.  Evaluation of Outcomes: Adequate for Discharge   LCSW Treatment Plan for Primary  Diagnosis: <principal problem not specified> Long Term Goal(s): Safe transition to appropriate next level of care at discharge, Engage patient in therapeutic group addressing interpersonal concerns.  Short Term Goals: Engage patient in aftercare planning with referrals and resources  Therapeutic Interventions: Assess for all discharge needs, 1 to 1 time with Social worker, Explore available resources and support systems, Assess for adequacy in community support network, Educate family and significant other(s) on suicide prevention, Complete Psychosocial Assessment, Interpersonal group therapy.  Evaluation of Outcomes: Adequate for Discharge   Progress in Treatment: Attending groups: Yes. Participating in groups: Yes. Minimally  Taking medication as prescribed: Yes. Toleration medication: Yes. Family/Significant other contact made: No, will contact:  if patient consents to collateral contacts Patient understands diagnosis: Yes. Discussing patient identified problems/goals with staff: Yes. Medical problems stabilized or resolved: Yes. Denies suicidal/homicidal ideation: No. Issues/concerns per patient self-inventory: No. Other:   New problem(s) identified:   New Short Term/Long Term Goal(s):Detox, medication stabilization, elimination of SI thoughts, development of comprehensive mental wellness plan.    Patient Goals: "Get myself on the right track and to be successful"    Discharge Plan or Barriers: Patient was accepted to [facility] on Monday, 03/12/2019 for residential treatment. He will follow up with [facility] for outpatient medication management and therapy services upon completion of residential treatment program. CSW will continue to follow.   Reason for Continuation of Hospitalization: Medication stabilization  Estimated Length of Stay: Monday, 03/12/2019  Attendees: Patient: Dale [name]  03/09/2019 9:39 AM  Physician: Dr. Nehemiah MassedFernando Cobos, MD;  Dr. Landry Mellow, MD  03/09/2019 9:39 AM  Nursing: Meriam Sprague.Kirtland Bouchard, RN; Ethelene Browns.A, RN 03/09/2019 9:39 AM  RN Care Manager: 03/09/2019 9:39 AM  Social Worker: Baldo Daub, LCSW 03/09/2019 9:39 AM  Recreational Therapist:  03/09/2019 9:39 AM  Other: Marciano Sequin, NP 03/09/2019 9:39 AM  Other:  03/09/2019 9:39 AM  Other: 03/09/2019 9:39 AM    Scribe for Treatment Team: [name], LCSWA 03/09/2019 9:39 AM

## 2019-03-09 NOTE — Progress Notes (Signed)
[facility] Group Notes:  (Nursing/MHT/Case Management/Adjunct)  Date:  03/09/2019  Time:  2030  Type of Therapy:  wrap up group  Participation Level:  Active  Participation Quality:  Attentive  Affect:  Irritable  Cognitive:  Appropriate  Insight:  Improving  Engagement in Group:  Engaged  Modes of Intervention:  Clarification, Education and Support  Summary of Progress/Problems: Pt is thankful he woke up to see another day and for his family. Pt plans on learning to say no! Pt shared that his favorite place is [facility] and his favorite sound is quietness.   Winfield Rast S 03/09/2019, 10:14 PM

## 2019-03-09 NOTE — Progress Notes (Signed)
Patient ID: Dale [name], male   DOB: 1965-11-15, 53 y.o.   MRN: [id number]  [place] NOVEL CORONAVIRUS (COVID-19) DAILY CHECK-OFF SYMPTOMS - answer yes or no to each - every day NO YES  Have you had a fever in the past 24 hours?  . Fever (Temp > 37.80C / 100F) X   Have you had any of these symptoms in the past 24 hours? . New Cough .  Sore Throat  .  Shortness of Breath .  Difficulty Breathing .  Unexplained Body Aches   X   Have you had any one of these symptoms in the past 24 hours not related to allergies?   . Runny Nose .  Nasal Congestion .  Sneezing   X   If you have had runny nose, nasal congestion, sneezing in the past 24 hours, has it worsened?  X   EXPOSURES - check yes or no X   Have you traveled outside the state in the past 14 days?  X   Have you been in contact with someone with a confirmed diagnosis of COVID-19 or PUI in the past 14 days without wearing appropriate PPE?  X   Have you been living in the same home as a person with confirmed diagnosis of COVID-19 or a PUI (household contact)?    X   Have you been diagnosed with COVID-19?    X              What to do next: Answered NO to all: Answered YES to anything:   Proceed with unit schedule Follow the [facility] Inpatient Flowsheet.

## 2019-03-09 NOTE — [facility] Group Notes (Signed)
03/09/2019 8:45am Type of Group and Topic: Psychoeducational Group: Discharge Planning  Participation Level: Active  Description of Group Discharge planning group reviews patient's anticipated discharge plans and assists patients to anticipate and address any barriers to wellness/recovery in the community. Suicide prevention education is reviewed with patients in group. Therapeutic Goals 1. Patients will state their anticipated discharge plan and mental health aftercare 2. Patients will identify potential barriers to wellness in the community setting 3. Patients will engage in problem solving, solution focused discussion of ways to anticipate and address barriers to wellness/recovery   Summary of Patient Progress Plan for Discharge/Comments:   Dale [name] reports he does not have any concerns regarding his discharge plans. He states that he is excited and motivated for his acceptance at [facility].   Transportation Means: Dale [name] will be transported to [place] by [employer]) transport.    Supports: No known supports at this time.   Therapeutic Modalities: Motivational Interviewing     Radonna Ricker, MSW, [place] Worker [facility]  Phone: [phone] 03/09/2019 2:58 PM

## 2019-03-10 NOTE — [facility] Group Notes (Signed)
LCSW Adult Therapy Group Note  Date:  03/10/2019  Time:  10:00-11:00AM  Group Topic: Vignettes to Examine Healthy vs Unhealthy Coping Techniques  Participation Level:  Minimal  Description:    To determine what unhealthy coping techniques typically are used or and what healthy coping techniques would be helpful in coping with various problems. Patients were guided in becoming aware of the differences between healthy and unhealthy coping techniques.  Vignettes were used to generate ideas, which led to a discussion about personal application.     Therapeutic Goals 1. Patient will be able to tell whether a coping skill is healthy or unhealthy. 2. Patient will identify 1-2 healthy and 1-2 unhealthy coping skills they regularly use 3. Patient will discuss vignettes presented to determine unhealthy coping skills the hypothetical person might choose to use, and healthy coping skills that would be more helpful. 4. Patients will support each other.  Summary of Patient Progress:  The patient expressed current healthy coping skills employed include opening up to staff about how he truly feels and having positive thoughts, while current unhealthy coping skills often used are isolation.  Patient participated very little as he kept falling asleep while sitting up in the dayroom.  Therapeutic Modalities Activity Psychoeducation Processing    [name], LCSW 03/10/2019, 1:25 PM

## 2019-03-10 NOTE — Progress Notes (Signed)
   03/10/19 1000  Psych Admission Type (Psych Patients Only)  Admission Status Voluntary  Psychosocial Assessment  Patient Complaints Anxiety;Depression;Insomnia  Eye Contact Brief  Facial Expression Flat  Affect Depressed  Speech Logical/coherent  Interaction Minimal  Motor Activity Unsteady  Appearance/Hygiene Unremarkable  Behavior Characteristics Cooperative  Mood Depressed;Anxious  Thought Process  Coherency WDL  Content WDL  Delusions None reported or observed  Perception WDL  Hallucination None reported or observed  Judgment Impaired  Confusion None  Danger to Self  Current suicidal ideation? Denies  Self-Injurious Behavior No self-injurious ideation or behavior indicators observed or expressed   Danger to Others  Danger to Others None reported or observed  Danger to Others Abnormal  Harmful Behavior to others No threats or harm toward other people  Destructive Behavior No threats or harm toward property

## 2019-03-10 NOTE — Progress Notes (Signed)
   03/10/19 0011  Psych Admission Type (Psych Patients Only)  Admission Status Voluntary  Psychosocial Assessment  Patient Complaints Anxiety  Eye Contact Brief  Facial Expression Flat;Sullen  Affect Appropriate to circumstance  Speech Logical/coherent  Interaction Minimal  Motor Activity Unsteady  Appearance/Hygiene Unremarkable  Behavior Characteristics Irritable  Mood Anxious;Depressed  Thought Process  Coherency WDL  Content WDL  Delusions None reported or observed  Perception WDL  Hallucination None reported or observed  Judgment Impaired  Confusion None  Danger to Self  Current suicidal ideation? Denies  Self-Injurious Behavior No self-injurious ideation or behavior indicators observed or expressed   Agreement Not to Harm Self Yes  Description of Agreement verbally contracts for safety  Danger to Others  Danger to Others None reported or observed  Danger to Others Abnormal  Harmful Behavior to others No threats or harm toward other people  Destructive Behavior No threats or harm toward property  D: Patient in dayroom c/o coming down with a cold. Pt had a running nose, cough and temperature 99.5. NP notified. Nasal spray ordered and pt made a do not admit. Pt has been wearing his mask all evening in the dayroom.  A: Medications administered as prescribed. Support and encouragement provided as needed.  R: Patient remains safe on the unit. Will continue to monitor for safety and stability.
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
## 2019-03-10 NOTE — Progress Notes (Signed)
D.  Pt pleasant on approach, continued right ankle pain chief complaint.  Pt has been ;managing this today with Tylenol and ice packs.  Pt had fever this past morning and since Tylenol has been in system regularly today, it is unclear if fever is still present.  Will re-evaluate in the AM.  Pt did request cough drops at bedtime.  Pt was positive for evening wrap up group, observed engaged appropriately with peers on the unit.  Pt is presently refusing to utilize walker, is walking with care and wearing ankle brace.  Pt denies SI/HI/AVH at this time.  A.  Support and encouragement offered, medication given as ordered  R.  Pt remains safe on the unit, will continue to monitor.

## 2019-03-11 MED ORDER — PRAZOSIN HCL 2 MG PO CAPS: 4.0000 mg | ORAL_CAPSULE | Freq: Every day | ORAL | 0 refills | Status: DC

## 2019-03-11 MED ORDER — OLANZAPINE 15 MG PO TABS: 15.0000 mg | ORAL_TABLET | Freq: Every day | ORAL | 0 refills | Status: DC

## 2019-03-11 MED ORDER — CITALOPRAM HYDROBROMIDE 20 MG PO TABS: 20.0000 mg | ORAL_TABLET | Freq: Every day | ORAL | 0 refills | Status: DC

## 2019-03-11 MED ORDER — DICYCLOMINE HCL 10 MG PO CAPS: 10.0000 mg | ORAL_CAPSULE | Freq: Three times a day (TID) | ORAL | 0 refills | Status: DC

## 2019-03-11 MED ORDER — OLANZAPINE 7.5 MG PO TABS
15.0000 mg | ORAL_TABLET | Freq: Every day | ORAL | Status: DC
  Administered 2019-03-11: 15 mg via ORAL
  Filled 2019-03-11: qty 6
  Filled 2019-03-11 (×2): qty 2

## 2019-03-11 MED ORDER — HYDROXYZINE HCL 25 MG PO TABS: 25.0000 mg | ORAL_TABLET | Freq: Three times a day (TID) | ORAL | 0 refills | Status: DC | PRN

## 2019-03-11 MED ORDER — FLUTICASONE PROPIONATE 50 MCG/ACT NA SUSP: 2.0000 | Freq: Every day | NASAL | 0 refills | Status: DC

## 2019-03-11 MED ORDER — PANTOPRAZOLE SODIUM 40 MG PO TBEC: 40.0000 mg | DELAYED_RELEASE_TABLET | Freq: Every day | ORAL | 0 refills | Status: DC

## 2019-03-11 MED ORDER — MENTHOL 3 MG MT LOZG: 1.0000 | LOZENGE | OROMUCOSAL | Status: DC | PRN

## 2019-03-11 MED ORDER — APIXABAN 2.5 MG PO TABS: 2.5000 mg | ORAL_TABLET | Freq: Two times a day (BID) | ORAL | 0 refills | Status: DC

## 2019-03-11 MED ORDER — DOCUSATE SODIUM 100 MG PO CAPS: 100.0000 mg | ORAL_CAPSULE | Freq: Every day | ORAL | 0 refills | Status: DC | PRN

## 2019-03-11 MED ORDER — METFORMIN HCL 500 MG PO TABS: 500.0000 mg | ORAL_TABLET | Freq: Every day | ORAL | 0 refills | Status: DC

## 2019-03-11 MED ORDER — GABAPENTIN 300 MG PO CAPS: 300.0000 mg | ORAL_CAPSULE | Freq: Three times a day (TID) | ORAL | 0 refills | Status: DC

## 2019-03-11 MED ORDER — SALINE SPRAY 0.65 % NA SOLN: 2.0000 | NASAL | 0 refills | Status: DC | PRN

## 2019-03-11 NOTE — Progress Notes (Addendum)
D:  Patient's self inventory sheet, patient has fair sleep, sleep medication not helpful.  Fair appetite, low energy level, poor concentration.  Rated depression and anxiety 7, hopeless 6.  Withdrawals no check marks.  Patient has felt pain today.  Physical pain, R foot, worst pain #7 in past 24 hours.  Goal is himself.  Plans to talk to staff.  No discharge plans A:  Medications administered per MD orders.  Emotional support and encouragement given patient. R:  Denied SI and HI, contracts for safety.  Denied A/V hallucinations.  Safety maintained with 15 minute checks.  Marland Kitchen

## 2019-03-11 NOTE — Progress Notes (Signed)
[facility] Group Notes:  (Nursing/MHT/Case Management/Adjunct)  Date:  03/11/2019  Time:  2030  Type of Therapy:  wrap up group  Participation Level:  Active  Participation Quality:  Appropriate, Attentive, Sharing and Supportive  Affect:  Appropriate  Cognitive:  Appropriate  Insight:  Improving  Engagement in Group:  Engaged  Modes of Intervention:  Clarification, Education and Support  Summary of Progress/Problems: Positive thinking and positive change were discussed.   Winfield Rast S 03/11/2019, 10:05 PM

## 2019-03-11 NOTE — BHH Suicide Risk Assessment (Signed)
[facility] Discharge Suicide Risk Assessment   Principal Problem: Severe major depression with psychotic features, mood-congruent [facility]) Discharge Diagnoses: Principal Problem:   Severe major depression with psychotic features, mood-congruent ([place]) Active Problems:   MDD (major depressive disorder), recurrent episode, severe ([place])   Total Time spent with patient: 15 minutes  Musculoskeletal: Strength & Muscle Tone: within normal limits Gait & Station: normal Patient leans: N/A  Psychiatric Specialty Exam: Review of Systems  Musculoskeletal: Positive for myalgias.  All other systems reviewed and are negative.   Blood pressure 93/70, pulse (!) 116, temperature 98.6 F (37 C), temperature source Oral, resp. rate 16, SpO2 97 %.There is no height or weight on file to calculate BMI.  General Appearance: Casual  Eye Contact::  Fair  Speech:  Normal Rate409  Volume:  Normal  Mood:  Anxious  Affect:  Congruent  Thought Process:  Coherent and Descriptions of Associations: Intact  Orientation:  Full (Time, Place, and Person)  Thought Content:  Logical  Suicidal Thoughts:  No  Homicidal Thoughts:  No  Memory:  Immediate;   Fair Recent;   Fair Remote;   Fair  Judgement:  Intact  Insight:  Fair  Psychomotor Activity:  Normal  Concentration:  Fair  Recall:  [employer] of Knowledge:Fair  Language: Good  Akathisia:  Negative  Handed:  Right  AIMS (if indicated):     Assets:  Desire for Improvement Resilience  Sleep:  Number of Hours: 6.75  Cognition: WNL  ADL's:  Intact   Mental Status Per Nursing Assessment::   On Admission:  Suicidal ideation indicated by patient  Demographic Factors:  Male, Divorced or widowed, Low socioeconomic status, Living alone and Unemployed  Loss Factors: NA  Historical Factors: Impulsivity  Risk Reduction Factors:   Positive coping skills or problem solving skills  Continued Clinical Symptoms:  Bipolar Disorder:   Depressive  phase Alcohol/Substance Abuse/Dependencies  Cognitive Features That Contribute To Risk:  None    Suicide Risk:  Minimal: No identifiable suicidal ideation.  Patients presenting with no risk factors but with morbid ruminations; may be classified as minimal risk based on the severity of the depressive symptoms  [facility]. Go on 03/12/2019.   Why: You have been accepted for residential treatment on 12/07 at 10:00am. You need to bring your hospital discharge paperwork, a 14 day supply of medication, and clothing. Contact information: [place] [address] [phone]        [facility] Follow up.   Why: Walk in hours for outpatient services are Monday- Friday from 8:00am-5:00pm. Please be sure to bring your Photo ID, SSN, proof of insurance and any insurance information if you are a new client. Please call if you have any additional questions.  Contact information: [address] [place] [address] [phone]           Plan Of Care/Follow-up recommendations:  Activity:  ad lib  Sharma Covert, MD 03/11/2019, 11:48 AM

## 2019-03-11 NOTE — BHH Group Notes (Signed)
[facility] LCSW Group Therapy Note  Date/Time:  03/11/2019 9:00-10:00 or 10:00-11:00AM  Type of Therapy and Topic:  Group Therapy:  Healthy and Unhealthy Supports  Participation Level:  Active   Description of Group:  Patients in this group were introduced to the idea of adding a variety of healthy supports to address the various needs in their lives.Patients discussed what additional healthy supports could be helpful in their recovery and wellness after discharge in order to prevent future hospitalizations.   An emphasis was placed on using counselor, doctor, therapy groups, 12-step groups, and problem-specific support groups to expand supports.  Several songs were played to emphasize points made throughout group.  Therapeutic Goals:   1)  discuss importance of adding supports to stay well once out of the hospital  2)  compare healthy versus unhealthy supports and identify some examples of each  3)  generate ideas and descriptions of healthy supports that can be added  4)  offer mutual support about how to address unhealthy supports  5)  encourage active participation in and adherence to discharge plan    Summary of Patient Progress:  The patient stated that current healthy supports in his life are one specific friend who saw what he was going through and stayed at him until he got some help rather than giving up on him, as well as the Administrative Coordinator who talked to the doctor about how much he needed and wanted to be admitted to this hospital.  CSW pointed out how he was a healthy self-support insomuch as he advocated for his admission to the hospital because he knew if he left, he would die.  Current unhealthy supports include people in his life who continue to practice their addiction and tempt him to do so with them.  The patient expressed a willingness to add therapist as support(s) to help in his recovery journey.  He has not previously been open to this idea.   Therapeutic  Modalities:   Motivational Interviewing Brief Solution-Focused Therapy  [name], LCSW
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
## 2019-03-11 NOTE — Plan of Care (Signed)
Nurse discussed anxiety, depression and coping skills with patient.  
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
## 2019-03-12 NOTE — Progress Notes (Signed)
Pt discharged to lobby. Pt was stable and appreciative at that time. All papers and prescriptions were given and valuables returned. Verbal understanding expressed. Denies SI/HI and A/VH. Pt given opportunity to express concerns and ask questions.  

## 2019-03-12 NOTE — Progress Notes (Signed)
  [facility] Adult Case Management Discharge Plan :  Will you be returning to the same living situation after discharge:  No. Patient discharged to [facility] for continuity of care  At discharge, do you have transportation home?: Yes,  [employer] ([employer]) transport Do you have the ability to pay for your medications: No.  Release of information consent forms completed and in the chart;  Patient's signature needed at discharge.  Patient to Follow up at: Follow-up [facility]. Go on 03/12/2019.   Why: You have been accepted for residential treatment on 12/07 at 10:00am. You need to bring your hospital discharge paperwork, a 14 day supply of medication, and clothing. Contact information: [place] [address] [phone]        [facility] Follow up.   Why: Walk in hours for outpatient services are Monday- Friday from 8:00am-5:00pm. Please be sure to bring your Photo ID, SSN, proof of insurance and any insurance information if you are a new client. Please call if you have any additional questions.  Contact information: [address] [place] [address] [phone]           Next level of care provider has access to [facility] and Suicide Prevention discussed: Yes,  with the patient     Has patient been referred to the Quitline?: N/A patient is not a smoker  Patient has been referred for addiction treatment: Yes  Marylee Floras, [place] 03/12/2019, 11:21 AM

## 2019-04-01 ENCOUNTER — Emergency Department (HOSPITAL_COMMUNITY)
Admission: EM | Admit: 2019-04-01 | Discharge: 2019-04-01 | Disposition: A | Discharge status: Home / Self Care | Payer: Self-pay | Attending: Emergency Medicine | Admitting: Emergency Medicine

## 2019-04-01 ENCOUNTER — Other Ambulatory Visit: Payer: Self-pay

## 2019-04-01 ENCOUNTER — Emergency Department (HOSPITAL_COMMUNITY): Payer: Self-pay

## 2019-04-01 DIAGNOSIS — R059 Cough, unspecified: Secondary | ICD-10-CM

## 2019-04-01 DIAGNOSIS — R0981 Nasal congestion: Secondary | ICD-10-CM | POA: Insufficient documentation

## 2019-04-01 DIAGNOSIS — I10 Essential (primary) hypertension: Secondary | ICD-10-CM | POA: Insufficient documentation

## 2019-04-01 DIAGNOSIS — Z7984 Long term (current) use of oral hypoglycemic drugs: Secondary | ICD-10-CM | POA: Insufficient documentation

## 2019-04-01 DIAGNOSIS — F17210 Nicotine dependence, cigarettes, uncomplicated: Secondary | ICD-10-CM | POA: Insufficient documentation

## 2019-04-01 DIAGNOSIS — R05 Cough: Secondary | ICD-10-CM | POA: Insufficient documentation

## 2019-04-01 DIAGNOSIS — E119 Type 2 diabetes mellitus without complications: Secondary | ICD-10-CM | POA: Insufficient documentation

## 2019-04-01 DIAGNOSIS — Z79899 Other long term (current) drug therapy: Secondary | ICD-10-CM | POA: Insufficient documentation

## 2019-04-01 MED ORDER — PREDNISONE 20 MG PO TABS
60.0000 mg | ORAL_TABLET | Freq: Once | ORAL | Status: AC
  Administered 2019-04-01: 60 mg via ORAL
  Filled 2019-04-01: qty 3

## 2019-04-01 MED ORDER — PREDNISONE 10 MG (21) PO TBPK: ORAL_TABLET | ORAL | 0 refills | Status: DC

## 2019-04-01 MED ORDER — DICLOFENAC SODIUM 1 % EX GEL
4.0000 g | Freq: Once | CUTANEOUS | Status: AC
  Administered 2019-04-01: 4 g via TOPICAL
  Filled 2019-04-01: qty 100

## 2019-04-01 MED ORDER — FLUTICASONE PROPIONATE 50 MCG/ACT NA SUSP: 2.0000 | Freq: Every day | NASAL | 2 refills | Status: DC

## 2019-04-01 MED ORDER — BENZONATATE 100 MG PO CAPS
200.0000 mg | ORAL_CAPSULE | Freq: Once | ORAL | Status: AC
  Administered 2019-04-01: 200 mg via ORAL
  Filled 2019-04-01: qty 2

## 2019-04-01 MED ORDER — BENZONATATE 100 MG PO CAPS: 100.0000 mg | ORAL_CAPSULE | Freq: Three times a day (TID) | ORAL | 0 refills | Status: DC

## 2019-04-01 MED ORDER — ALBUTEROL SULFATE HFA 108 (90 BASE) MCG/ACT IN AERS
2.0000 | INHALATION_SPRAY | Freq: Once | RESPIRATORY_TRACT | Status: AC
  Administered 2019-04-01: 2 via RESPIRATORY_TRACT
  Filled 2019-04-01: qty 6.7

## 2019-04-01 NOTE — ED Notes (Signed)
During d/c instructions patient became agitated. RN Rolla Plate came in to help this RN. Patient began yelling saying "I am sick! Y'all don't give a fuck. Close the damn door." Off duty and security at bedside to assist with d/c as patient has become increasingly agitated and RN feared for her safety.
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
## 2019-04-01 NOTE — ED Notes (Signed)
Patient is requesting to speak to the provider again regarding "My feet are cracking open from the cold" and "My finger that I just had surgery on hurts" RN attempted to explain to patient that his feet being cold is not an emergent situation that can be fixed. Patient has been given multiple warm blankets.  Patient became agitated with this RN and stated "Well has the food already been passed out?" RN explained that the patient did not have a tray ordered but would try and provide him with some food.  PA made aware of patient's concerns and request to speak to him again.

## 2019-04-01 NOTE — ED Notes (Signed)
Upon discharge patient became verbally aggressive yelling loudly for this RN and Verline Lema, RN to get out of room and shut the door. Security at bedside.

## 2019-04-01 NOTE — ED Triage Notes (Addendum)
BIB EMS,  Homeless. Pt reports new onset cough, SHOB and chills x 1 day. Pt had Covid previously and was hospitalized. Pt reports pain in rib cage area. Afibirle. Pt reports runny nose.   PT also reports right ankle pain and right 5th Finger pain that had surgery on Tuesday in [place].   Pt ambulatory from EMS without difficulties.   138/86 HR 90 98% RA 98.1 CBG  134 RR 18

## 2019-04-01 NOTE — Discharge Instructions (Addendum)
Your symptoms are likely consistent with a viral illness. Viruses do not require or respond to antibiotics. Treatment is symptomatic care and it is important to note that these symptoms may last for 7-14 days.   Hand washing: Wash your hands throughout the day, but especially before and after touching the face, using the restroom, sneezing, coughing, or touching surfaces that have been coughed or sneezed upon. Hydration: Symptoms of most illnesses will be intensified and complicated by dehydration. Dehydration can also extend the duration of symptoms. Drink plenty of fluids and get plenty of rest. You should be drinking at least half a liter of water an hour to stay hydrated. Electrolyte drinks (ex. Gatorade, Powerade, Pedialyte) are also encouraged. You should be drinking enough fluids to make your urine light yellow, almost clear. If this is not the case, you are not drinking enough water. Please note that some of the treatments indicated below will not be effective if you are not adequately hydrated. Diet: Please concentrate on hydration, however, you may introduce food slowly.  Start with a clear liquid diet, progressed to a full liquid diet, and then bland solids as you are able. Pain or fever:   Acetaminophen: May take acetaminophen (generic for Tylenol), as needed, for pain. Your daily total maximum amount of acetaminophen from all sources should be limited to 4000mg /day for persons without liver problems, or 2000mg /day for those with liver problems. Diclofenac gel: This is a topical anti-inflammatory medication and can be applied directly to the painful region.  Do not use on the face or genitals.  This medication may be used as an alternative to oral anti-inflammatory medications, such as ibuprofen or naproxen. Cough: Use the benzonatate (generic for Tessalon) for cough.  Teas, warm liquids, broths, and honey can also help with cough. Albuterol: May use the albuterol as needed for instances of  shortness of breath. Prednisone: Take the prednisone, as directed, in its entirety. Zyrtec or Claritin: May add these medication daily to control underlying symptoms of congestion, sneezing, and other signs of allergies.  These medications are available over-the-counter. Generics: Cetirizine (generic for Zyrtec) and loratadine (generic for Claritin). Fluticasone: Use fluticasone (generic for Flonase), as directed, for nasal and sinus congestion.  This medication is available over-the-counter. Congestion: Plain guaifenesin (generic for plain Mucinex) may help relieve congestion. Saline sinus rinses and saline nasal sprays may also help relieve congestion. If you do not have high blood pressure, heart problems, or an allergy to such medications, you may also try phenylephrine or Sudafed. Sore throat: Warm liquids or Chloraseptic spray may help soothe a sore throat. Gargle twice a day with a salt water solution made from a half teaspoon of salt in a cup of warm water.  Follow up: Follow up with a primary care provider within the next two weeks should symptoms fail to resolve. Return: Return to the ED for significantly worsening symptoms, shortness of breath, persistent vomiting, large amounts of blood in stool, or any other major concerns.  For prescription assistance, may try using prescription discount sites or apps, such as goodrx.com

## 2019-04-01 NOTE — ED Notes (Signed)
Patient given orange juice

## 2019-04-01 NOTE — ED Notes (Signed)
Patient has been getting agitated with staff and slammed the bedside tray into the door and then reported "I was trying to grab my cup and the tray slid out from under it"

## 2019-04-07 ENCOUNTER — Encounter (HOSPITAL_COMMUNITY): Payer: Self-pay

## 2019-04-07 ENCOUNTER — Emergency Department (HOSPITAL_COMMUNITY)
Admission: EM | Admit: 2019-04-07 | Discharge: 2019-04-08 | Disposition: A | Discharge status: Home / Self Care | Payer: Self-pay | Attending: Emergency Medicine | Admitting: Emergency Medicine

## 2019-04-07 DIAGNOSIS — F1420 Cocaine dependence, uncomplicated: Secondary | ICD-10-CM | POA: Insufficient documentation

## 2019-04-07 DIAGNOSIS — F332 Major depressive disorder, recurrent severe without psychotic features: Secondary | ICD-10-CM | POA: Insufficient documentation

## 2019-04-07 DIAGNOSIS — Z5900 Homelessness unspecified: Secondary | ICD-10-CM

## 2019-04-07 DIAGNOSIS — F17210 Nicotine dependence, cigarettes, uncomplicated: Secondary | ICD-10-CM | POA: Insufficient documentation

## 2019-04-07 DIAGNOSIS — I10 Essential (primary) hypertension: Secondary | ICD-10-CM | POA: Insufficient documentation

## 2019-04-07 DIAGNOSIS — Z7901 Long term (current) use of anticoagulants: Secondary | ICD-10-CM | POA: Insufficient documentation

## 2019-04-07 DIAGNOSIS — F1020 Alcohol dependence, uncomplicated: Secondary | ICD-10-CM | POA: Insufficient documentation

## 2019-04-07 DIAGNOSIS — Z7984 Long term (current) use of oral hypoglycemic drugs: Secondary | ICD-10-CM | POA: Insufficient documentation

## 2019-04-07 DIAGNOSIS — F17220 Nicotine dependence, chewing tobacco, uncomplicated: Secondary | ICD-10-CM | POA: Insufficient documentation

## 2019-04-07 DIAGNOSIS — F1220 Cannabis dependence, uncomplicated: Secondary | ICD-10-CM | POA: Insufficient documentation

## 2019-04-07 DIAGNOSIS — F333 Major depressive disorder, recurrent, severe with psychotic symptoms: Secondary | ICD-10-CM | POA: Diagnosis present

## 2019-04-07 DIAGNOSIS — E119 Type 2 diabetes mellitus without complications: Secondary | ICD-10-CM | POA: Insufficient documentation

## 2019-04-07 DIAGNOSIS — Z20822 Contact with and (suspected) exposure to covid-19: Secondary | ICD-10-CM | POA: Insufficient documentation

## 2019-04-07 DIAGNOSIS — R45851 Suicidal ideations: Secondary | ICD-10-CM | POA: Insufficient documentation

## 2019-04-07 DIAGNOSIS — R6889 Other general symptoms and signs: Secondary | ICD-10-CM

## 2019-04-07 DIAGNOSIS — F4310 Post-traumatic stress disorder, unspecified: Secondary | ICD-10-CM | POA: Insufficient documentation

## 2019-04-07 DIAGNOSIS — Z79899 Other long term (current) drug therapy: Secondary | ICD-10-CM | POA: Insufficient documentation

## 2019-04-07 NOTE — ED Triage Notes (Signed)
[place] [facility] [place] has been walking all day and now having foot pain, pt also c/o of pinky pain where he recently had stiches, denies injury

## 2019-04-08 ENCOUNTER — Other Ambulatory Visit: Payer: Self-pay

## 2019-04-08 LAB — RAPID URINE DRUG SCREEN, HOSP PERFORMED
Amphetamines: NOT DETECTED
Barbiturates: NOT DETECTED
Benzodiazepines: NOT DETECTED
Cocaine: POSITIVE — AB
Opiates: NOT DETECTED
Tetrahydrocannabinol: NOT DETECTED

## 2019-04-08 LAB — CBC WITH DIFFERENTIAL/PLATELET
Abs Immature Granulocytes: 0.03 10*3/uL (ref 0.00–0.07)
Basophils Absolute: 0 10*3/uL (ref 0.0–0.1)
Basophils Relative: 1 %
Eosinophils Absolute: 0.5 10*3/uL (ref 0.0–0.5)
Eosinophils Relative: 9 %
HCT: 43.7 % (ref 39.0–52.0)
Hemoglobin: 14.2 g/dL (ref 13.0–17.0)
Immature Granulocytes: 1 %
Lymphocytes Relative: 32 %
Lymphs Abs: 1.7 10*3/uL (ref 0.7–4.0)
MCH: 29.6 pg (ref 26.0–34.0)
MCHC: 32.5 g/dL (ref 30.0–36.0)
MCV: 91 fL (ref 80.0–100.0)
Monocytes Absolute: 0.6 10*3/uL (ref 0.1–1.0)
Monocytes Relative: 12 %
Neutro Abs: 2.4 10*3/uL (ref 1.7–7.7)
Neutrophils Relative %: 45 %
Platelets: 217 10*3/uL (ref 150–400)
RBC: 4.8 MIL/uL (ref 4.22–5.81)
RDW: 14.1 % (ref 11.5–15.5)
WBC: 5.2 10*3/uL (ref 4.0–10.5)
nRBC: 0 % (ref 0.0–0.2)

## 2019-04-08 LAB — SALICYLATE LEVEL: Salicylate Lvl: <7 mg/dL — ABNORMAL LOW (ref 7.0–30.0)

## 2019-04-08 LAB — COMPREHENSIVE METABOLIC PANEL
ALT: 30 U/L (ref 0–44)
AST: 38 U/L (ref 15–41)
Albumin: 3.7 g/dL (ref 3.5–5.0)
Alkaline Phosphatase: 80 U/L (ref 38–126)
Anion gap: 8 (ref 5–15)
BUN: 14 mg/dL (ref 6–20)
CO2: 24 mmol/L (ref 22–32)
Calcium: 9.1 mg/dL (ref 8.9–10.3)
Chloride: 109 mmol/L (ref 98–111)
Creatinine, Ser: 1.24 mg/dL (ref 0.61–1.24)
GFR calc Af Amer: >60 mL/min (ref >60)
GFR calc non Af Amer: >60 mL/min (ref >60)
Glucose, Bld: 99 mg/dL (ref 70–99)
Potassium: 4.1 mmol/L (ref 3.5–5.1)
Sodium: 141 mmol/L (ref 135–145)
Total Bilirubin: 0.5 mg/dL (ref 0.3–1.2)
Total Protein: 6.6 g/dL (ref 6.5–8.1)

## 2019-04-08 LAB — RESPIRATORY PANEL BY RT PCR (FLU A&B, COVID)
Influenza A by PCR: NEGATIVE
Influenza B by PCR: NEGATIVE
SARS Coronavirus 2 by RT PCR: NEGATIVE

## 2019-04-08 LAB — ETHANOL: Alcohol, Ethyl (B): <10 mg/dL (ref <10)

## 2019-04-08 LAB — ACETAMINOPHEN LEVEL: Acetaminophen (Tylenol), Serum: <10 ug/mL — ABNORMAL LOW (ref 10–30)

## 2019-04-08 MED ORDER — HYDROXYZINE HCL 25 MG PO TABS
25.0000 mg | ORAL_TABLET | Freq: Three times a day (TID) | ORAL | Status: DC | PRN
  Administered 2019-04-08: 25 mg via ORAL
  Filled 2019-04-08: qty 1

## 2019-04-08 MED ORDER — ACETAMINOPHEN 325 MG PO TABS: 650.0000 mg | ORAL_TABLET | Freq: Four times a day (QID) | ORAL | Status: DC | PRN

## 2019-04-08 MED ORDER — METFORMIN HCL 500 MG PO TABS
500.0000 mg | ORAL_TABLET | Freq: Every day | ORAL | Status: DC
  Administered 2019-04-08: 500 mg via ORAL
  Filled 2019-04-08: qty 1

## 2019-04-08 MED ORDER — APIXABAN 2.5 MG PO TABS
2.5000 mg | ORAL_TABLET | Freq: Two times a day (BID) | ORAL | Status: DC
  Administered 2019-04-08: 2.5 mg via ORAL
  Filled 2019-04-08 (×2): qty 1

## 2019-04-08 MED ORDER — PANTOPRAZOLE SODIUM 40 MG PO TBEC
40.0000 mg | DELAYED_RELEASE_TABLET | Freq: Every day | ORAL | Status: DC
  Administered 2019-04-08: 40 mg via ORAL
  Filled 2019-04-08: qty 1

## 2019-04-08 MED ORDER — GABAPENTIN 300 MG PO CAPS
300.0000 mg | ORAL_CAPSULE | Freq: Three times a day (TID) | ORAL | Status: DC
  Administered 2019-04-08 (×2): 300 mg via ORAL
  Filled 2019-04-08 (×2): qty 1
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
## 2019-04-08 NOTE — ED Notes (Signed)
Pt bathing self in room and getting dressed.

## 2019-04-08 NOTE — Discharge Instructions (Signed)
Cleared by behavioral health for discharge home.  Follow-up as per behavioral health. 

## 2019-04-08 NOTE — ED Provider Notes (Signed)
Patient cleared by behavioral health for discharge home.  Social worker also involved.   Vanetta Mulders, MD 04/08/19 1306

## 2019-04-08 NOTE — ED Notes (Signed)
Pt noted to be sleeping. Wakes intermittently asking for medicine and food. Pt given graham crackers and sandwich.

## 2019-04-08 NOTE — ED Notes (Signed)
Pt arrived to Rm 52 via stretcher - pt noted to be sleepy. Pt dressed in burgundy scrubs. Sitter w/pt. Belongings - 2 belongings bags and 1 gray bag - placed at nurses' desk for inventory.

## 2019-04-08 NOTE — Progress Notes (Signed)
CSW faxed housing/shelter resources to [facility] ED for pt. CSW informed Kriste Basque, Charity fundraiser.   [name] MSW, LCSWA Clincal Social Worker Disposition  [facility] Ph: [phone] Fax: [phone]

## 2019-04-08 NOTE — ED Notes (Signed)
Pt completing TTS  
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
## 2019-04-08 NOTE — ED Notes (Addendum)
Woke pt so may eat lunch. Resources discussed and given - w/discharge instructions and questions answered to satisfaction. ALL belongings - 2 labeled belongings bags and back pack - returned to pt - Pt verified all items present.

## 2019-04-08 NOTE — ED Notes (Signed)
TTS advises to be reevaluated in the morning
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
## 2019-04-08 NOTE — Progress Notes (Signed)
CSW spoke with [name] on call staff who reported she was unaware of a client on their team with pt's name. Staff reached out to her supervisor who was told that this pt is not on the [facility], but is supposed to be receiving medication management from [place]. Pt has not been attending scheduled appointments for med management and has not been answering phone calls from [facility].   [name] MSW, LCSWA Clincal Social Worker Disposition  [facility] Ph: [phone] Fax: [phone]

## 2019-04-19 ENCOUNTER — Encounter (HOSPITAL_COMMUNITY): Payer: Self-pay | Admitting: Emergency Medicine

## 2019-04-19 ENCOUNTER — Emergency Department (HOSPITAL_COMMUNITY)
Admission: EM | Admit: 2019-04-19 | Discharge: 2019-04-19 | Disposition: A | Discharge status: Home / Self Care | Payer: Self-pay | Attending: Emergency Medicine | Admitting: Emergency Medicine

## 2019-04-19 DIAGNOSIS — E119 Type 2 diabetes mellitus without complications: Secondary | ICD-10-CM | POA: Insufficient documentation

## 2019-04-19 DIAGNOSIS — I10 Essential (primary) hypertension: Secondary | ICD-10-CM | POA: Insufficient documentation

## 2019-04-19 DIAGNOSIS — F17210 Nicotine dependence, cigarettes, uncomplicated: Secondary | ICD-10-CM | POA: Insufficient documentation

## 2019-04-19 DIAGNOSIS — Z7984 Long term (current) use of oral hypoglycemic drugs: Secondary | ICD-10-CM | POA: Insufficient documentation

## 2019-04-19 DIAGNOSIS — L089 Local infection of the skin and subcutaneous tissue, unspecified: Secondary | ICD-10-CM | POA: Insufficient documentation

## 2019-04-19 DIAGNOSIS — Z79899 Other long term (current) drug therapy: Secondary | ICD-10-CM | POA: Insufficient documentation

## 2019-04-19 DIAGNOSIS — Z7901 Long term (current) use of anticoagulants: Secondary | ICD-10-CM | POA: Insufficient documentation

## 2019-04-19 DIAGNOSIS — Z4801 Encounter for change or removal of surgical wound dressing: Secondary | ICD-10-CM | POA: Insufficient documentation

## 2019-04-19 MED ORDER — CEPHALEXIN 500 MG PO CAPS
500.0000 mg | ORAL_CAPSULE | Freq: Once | ORAL | Status: AC
  Administered 2019-04-19: 17:00:00 500 mg via ORAL
  Filled 2019-04-19: qty 1

## 2019-04-19 MED ORDER — CEPHALEXIN 500 MG PO CAPS: 500.0000 mg | ORAL_CAPSULE | Freq: Two times a day (BID) | ORAL | 0 refills | Status: AC
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
## 2019-04-19 NOTE — Discharge Instructions (Signed)
As discussed, today's evaluation has been generally reassuring. As important you monitor your finger, and your general condition.  Do not hesitate to return here if you develop new, or concerning changes. Lewis, please be sure to follow-up with our affiliated clinic to establish primary care relationship and for assistance with obtaining her medication.
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
## 2019-04-19 NOTE — Care Management (Signed)
[facility] ED CM was attempting to assist with prescriptions patient walked out as per ED RN.
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
## 2019-05-19 ENCOUNTER — Encounter (HOSPITAL_COMMUNITY): Payer: Self-pay | Admitting: Nurse Practitioner

## 2019-05-19 ENCOUNTER — Observation Stay (HOSPITAL_COMMUNITY)
Admission: RE | Admit: 2019-05-19 | Discharge: 2019-05-20 | Disposition: A | Discharge status: Home / Self Care | Payer: Self-pay | Attending: Psychiatry | Admitting: Psychiatry

## 2019-05-19 ENCOUNTER — Other Ambulatory Visit: Payer: Self-pay

## 2019-05-19 ENCOUNTER — Emergency Department (HOSPITAL_COMMUNITY): Admission: EM | Admit: 2019-05-19 | Discharge: 2019-05-19 | Discharge status: Against Medical Advice | Payer: Self-pay

## 2019-05-19 DIAGNOSIS — F17210 Nicotine dependence, cigarettes, uncomplicated: Secondary | ICD-10-CM | POA: Insufficient documentation

## 2019-05-19 DIAGNOSIS — Z87820 Personal history of traumatic brain injury: Secondary | ICD-10-CM | POA: Insufficient documentation

## 2019-05-19 DIAGNOSIS — Z7901 Long term (current) use of anticoagulants: Secondary | ICD-10-CM | POA: Insufficient documentation

## 2019-05-19 DIAGNOSIS — Z818 Family history of other mental and behavioral disorders: Secondary | ICD-10-CM | POA: Insufficient documentation

## 2019-05-19 DIAGNOSIS — I10 Essential (primary) hypertension: Secondary | ICD-10-CM | POA: Insufficient documentation

## 2019-05-19 DIAGNOSIS — F319 Bipolar disorder, unspecified: Secondary | ICD-10-CM | POA: Insufficient documentation

## 2019-05-19 DIAGNOSIS — Z20822 Contact with and (suspected) exposure to covid-19: Secondary | ICD-10-CM | POA: Insufficient documentation

## 2019-05-19 DIAGNOSIS — Z809 Family history of malignant neoplasm, unspecified: Secondary | ICD-10-CM | POA: Insufficient documentation

## 2019-05-19 DIAGNOSIS — R45850 Homicidal ideations: Secondary | ICD-10-CM | POA: Insufficient documentation

## 2019-05-19 DIAGNOSIS — Z7984 Long term (current) use of oral hypoglycemic drugs: Secondary | ICD-10-CM | POA: Insufficient documentation

## 2019-05-19 DIAGNOSIS — K219 Gastro-esophageal reflux disease without esophagitis: Secondary | ICD-10-CM | POA: Insufficient documentation

## 2019-05-19 DIAGNOSIS — G4700 Insomnia, unspecified: Secondary | ICD-10-CM | POA: Insufficient documentation

## 2019-05-19 DIAGNOSIS — N289 Disorder of kidney and ureter, unspecified: Secondary | ICD-10-CM | POA: Insufficient documentation

## 2019-05-19 DIAGNOSIS — R45851 Suicidal ideations: Secondary | ICD-10-CM | POA: Insufficient documentation

## 2019-05-19 DIAGNOSIS — Z7952 Long term (current) use of systemic steroids: Secondary | ICD-10-CM | POA: Insufficient documentation

## 2019-05-19 DIAGNOSIS — Z886 Allergy status to analgesic agent status: Secondary | ICD-10-CM | POA: Insufficient documentation

## 2019-05-19 DIAGNOSIS — F209 Schizophrenia, unspecified: Secondary | ICD-10-CM | POA: Insufficient documentation

## 2019-05-19 DIAGNOSIS — Z86718 Personal history of other venous thrombosis and embolism: Secondary | ICD-10-CM | POA: Insufficient documentation

## 2019-05-19 DIAGNOSIS — F1494 Cocaine use, unspecified with cocaine-induced mood disorder: Secondary | ICD-10-CM | POA: Diagnosis present

## 2019-05-19 DIAGNOSIS — E119 Type 2 diabetes mellitus without complications: Secondary | ICD-10-CM | POA: Insufficient documentation

## 2019-05-19 DIAGNOSIS — Z79899 Other long term (current) drug therapy: Secondary | ICD-10-CM | POA: Insufficient documentation

## 2019-05-19 DIAGNOSIS — Z833 Family history of diabetes mellitus: Secondary | ICD-10-CM | POA: Insufficient documentation

## 2019-05-19 DIAGNOSIS — F4310 Post-traumatic stress disorder, unspecified: Secondary | ICD-10-CM | POA: Insufficient documentation

## 2019-05-19 DIAGNOSIS — F1414 Cocaine abuse with cocaine-induced mood disorder: Principal | ICD-10-CM | POA: Insufficient documentation

## 2019-05-19 DIAGNOSIS — K5900 Constipation, unspecified: Secondary | ICD-10-CM | POA: Insufficient documentation

## 2019-05-19 LAB — COMPREHENSIVE METABOLIC PANEL
ALT: 23 U/L (ref 0–44)
AST: 26 U/L (ref 15–41)
Albumin: 3.6 g/dL (ref 3.5–5.0)
Alkaline Phosphatase: 62 U/L (ref 38–126)
Anion gap: 8 (ref 5–15)
BUN: 15 mg/dL (ref 6–20)
CO2: 25 mmol/L (ref 22–32)
Calcium: 9.1 mg/dL (ref 8.9–10.3)
Chloride: 109 mmol/L (ref 98–111)
Creatinine, Ser: 1.36 mg/dL — ABNORMAL HIGH (ref 0.61–1.24)
GFR calc Af Amer: >60 mL/min (ref >60)
GFR calc non Af Amer: 59 mL/min — ABNORMAL LOW (ref >60)
Glucose, Bld: 116 mg/dL — ABNORMAL HIGH (ref 70–99)
Potassium: 3.9 mmol/L (ref 3.5–5.1)
Sodium: 142 mmol/L (ref 135–145)
Total Bilirubin: 0.6 mg/dL (ref 0.3–1.2)
Total Protein: 6.6 g/dL (ref 6.5–8.1)

## 2019-05-19 LAB — CBC
HCT: 43.7 % (ref 39.0–52.0)
Hemoglobin: 14.1 g/dL (ref 13.0–17.0)
MCH: 29.4 pg (ref 26.0–34.0)
MCHC: 32.3 g/dL (ref 30.0–36.0)
MCV: 91.2 fL (ref 80.0–100.0)
Platelets: 225 10*3/uL (ref 150–400)
RBC: 4.79 MIL/uL (ref 4.22–5.81)
RDW: 13.7 % (ref 11.5–15.5)
WBC: 6.1 10*3/uL (ref 4.0–10.5)
nRBC: 0 % (ref 0.0–0.2)

## 2019-05-19 LAB — RESPIRATORY PANEL BY RT PCR (FLU A&B, COVID)
Influenza A by PCR: NEGATIVE
Influenza B by PCR: NEGATIVE
SARS Coronavirus 2 by RT PCR: NEGATIVE

## 2019-05-19 LAB — GLUCOSE, CAPILLARY
Glucose-Capillary: 117 mg/dL — ABNORMAL HIGH (ref 70–99)
Glucose-Capillary: 124 mg/dL — ABNORMAL HIGH (ref 70–99)
Glucose-Capillary: 81 mg/dL (ref 70–99)
Glucose-Capillary: 86 mg/dL (ref 70–99)

## 2019-05-19 MED ORDER — OLANZAPINE 5 MG PO TABS
5.0000 mg | ORAL_TABLET | Freq: Every day | ORAL | Status: DC
  Administered 2019-05-19: 5 mg via ORAL
  Filled 2019-05-19: qty 1

## 2019-05-19 MED ORDER — CITALOPRAM HYDROBROMIDE 20 MG PO TABS
20.0000 mg | ORAL_TABLET | Freq: Every day | ORAL | Status: DC
  Administered 2019-05-20: 20 mg via ORAL
  Filled 2019-05-19: qty 1

## 2019-05-19 MED ORDER — HYDROXYZINE HCL 25 MG PO TABS
25.0000 mg | ORAL_TABLET | Freq: Three times a day (TID) | ORAL | Status: DC | PRN
  Administered 2019-05-19 (×2): 25 mg via ORAL
  Filled 2019-05-19 (×2): qty 1

## 2019-05-19 MED ORDER — ALUM & MAG HYDROXIDE-SIMETH 200-200-20 MG/5ML PO SUSP: 30.0000 mL | ORAL | Status: DC | PRN

## 2019-05-19 MED ORDER — TRAZODONE HCL 50 MG PO TABS
50.0000 mg | ORAL_TABLET | Freq: Every evening | ORAL | Status: DC | PRN
  Administered 2019-05-19: 50 mg via ORAL
  Filled 2019-05-19: qty 1

## 2019-05-19 MED ORDER — MAGNESIUM HYDROXIDE 400 MG/5ML PO SUSP: 30.0000 mL | Freq: Every day | ORAL | Status: DC | PRN

## 2019-05-19 MED ORDER — PRAZOSIN HCL 1 MG PO CAPS
2.0000 mg | ORAL_CAPSULE | Freq: Every day | ORAL | Status: DC
  Administered 2019-05-19: 2 mg via ORAL
  Filled 2019-05-19: qty 2

## 2019-05-19 MED ORDER — APIXABAN 2.5 MG PO TABS
2.5000 mg | ORAL_TABLET | Freq: Two times a day (BID) | ORAL | Status: DC
  Administered 2019-05-19 – 2019-05-20 (×3): 2.5 mg via ORAL
  Filled 2019-05-19 (×7): qty 1

## 2019-05-19 MED ORDER — PANTOPRAZOLE SODIUM 40 MG PO TBEC
40.0000 mg | DELAYED_RELEASE_TABLET | Freq: Every day | ORAL | Status: DC
  Administered 2019-05-19 – 2019-05-20 (×2): 40 mg via ORAL
  Filled 2019-05-19 (×2): qty 1

## 2019-05-19 MED ORDER — ACETAMINOPHEN 325 MG PO TABS
650.0000 mg | ORAL_TABLET | Freq: Four times a day (QID) | ORAL | Status: DC | PRN
  Administered 2019-05-19: 650 mg via ORAL
  Filled 2019-05-19: qty 2

## 2019-05-19 MED ORDER — METFORMIN HCL 500 MG PO TABS
500.0000 mg | ORAL_TABLET | Freq: Every day | ORAL | Status: DC
  Administered 2019-05-19 – 2019-05-20 (×2): 500 mg via ORAL
  Filled 2019-05-19 (×2): qty 1

## 2019-05-19 NOTE — Progress Notes (Signed)
Patient ID: [name], male   DOB: April 28, 1965, 54 y.o.   MRN: [id number] Pt A&O x 4, no distress noted, gait slow but steady.  Pt cooperative, irritable & guarded.  Passive SI noted,  Monitoring for safety.
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
## 2019-05-19 NOTE — Progress Notes (Signed)
   05/19/19 1111  Psych Admission Type (Psych Patients Only)  Admission Status Voluntary  Psychosocial Assessment  Patient Complaints Decreased concentration;Hopelessness;Irritability;Insomnia  Eye Contact Brief  Facial Expression Anxious  Affect Irritable;Anxious  Speech UTA  Interaction Assertive  Motor Activity Slow  Appearance/Hygiene Disheveled;Body odor  Behavior Characteristics Cooperative;Agitated;Anxious  Thought Process  Coherency WDL  Content WDL  Delusions WDL  Perception WDL  Hallucination Auditory;Visual  Judgment Poor  Confusion WDL  Danger to Self  Current suicidal ideation? Active  Self-Injurious Behavior No self-injurious ideation or behavior indicators observed or expressed   Agreement Not to Harm Self Yes  Description of Agreement verbal  Danger to Others  Danger to Others Reported or observed  Danger to Others Abnormal  Harmful Behavior to others No threats or harm toward other people  Destructive Behavior No threats or harm toward property

## 2019-05-19 NOTE — Progress Notes (Signed)
Pt transferred from 200 hall to 402-2, pt was ambulatory but walking with care. No distress noted, pt oriented to the unit and his room, pt calm and safe at this time, maintain on q 15 checks for safety, will continue to monitor.
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
## 2019-05-19 NOTE — ED Notes (Signed)
Pt called for triage. He was not visualized in the lobby or the men's bathroom.

## 2019-05-20 LAB — GLUCOSE, CAPILLARY: Glucose-Capillary: 78 mg/dL (ref 70–99)
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
## 2019-05-20 NOTE — Progress Notes (Signed)
Discharge note  Patient verbalizes readiness for discharge. Follow up plan explained, AVS given. Prescriptions and teaching provided. Belongings returned and signed for. Patient verbalizes understanding. Patient denies SI/HI and assures this Clinical research associate he will seek assistance should that change. Patient discharged to lobby with bus pass.

## 2019-05-20 NOTE — [facility] Suicide Risk Assessment (Signed)
Suicide Risk Assessment  Discharge Assessment   [facility] Discharge Suicide Risk Assessment   Principal Problem: Cocaine abuse with cocaine-induced mood disorder [facility] Discharge Diagnoses: Principal Problem:   Cocaine abuse with cocaine-induced mood disorder (HCC) Active Problems:   Cocaine-induced mood disorder (HCC)   Total Time spent with patient: 15 minutes   Evaluation: Dale [name] was seen and evaluated by attending psychiatrist MD Tamera Punt and nurse practitioner T. Donaciano Range. He is awake, alert and oriented x3 patient is well-known to this service. Citing " I was mentally unstable, poor concentration and my boss man that I needed to be seen." Stated he was at work when he had a mental breakdown and was advised to follow-up at the hospital. Denies drugs or alcohol. Reports he has been off of his medication. States he is followed by [employer]. Discussed restarting medications providing paper prescription x7 days until he is able to follow-up with his attending psychiatrist. Patient was receptive to plan. Support, encouragement and  reassurance was provided.  Musculoskeletal: Strength & Muscle Tone: within normal limits Gait & Station: normal Patient leans: N/A  Psychiatric Specialty Exam:   Blood pressure 113/82, pulse (!) 108, temperature 97.8 F (36.6 C), temperature source Oral, resp. rate 18, SpO2 100 %.There is no height or weight on file to calculate BMI.  General Appearance: Casual  Eye Contact::  Good  Speech:  Clear and Coherent409  Volume:  Normal  Mood:  Anxious and Depressed  Affect:  Congruent  Thought Process:  Coherent  Orientation:  Full (Time, Place, and Person)  Thought Content:  Hallucinations: None  Suicidal Thoughts:  Yes.  with intent/plan  Homicidal Thoughts:  No  Memory:  Immediate;   Fair Recent;   Fair  Judgement:  Fair  Insight:  Fair  Psychomotor Activity:  Normal  Concentration:  Fair  Recall:  [employer] of Knowledge:Fair  Language: Fair  Akathisia:  No   Handed:  Right  AIMS (if indicated):     Assets:  Communication Skills Desire for Improvement Resilience Social Support  Sleep:     Cognition: WNL  ADL's:  Intact   Mental Status Per Nursing Assessment::   On Admission:  Suicidal ideation indicated by patient  Demographic Factors:  Male and Low socioeconomic status  Loss Factors: Financial problems/change in socioeconomic status  Historical Factors: Family history of mental illness or substance abuse  Risk Reduction Factors:   Employed  Continued Clinical Symptoms:  Bipolar Disorder:   Depressive phase  Cognitive Features That Contribute To Risk:  Closed-mindedness    Suicide Risk:  Minimal: No identifiable suicidal ideation.  Patients presenting with no risk factors but with morbid ruminations; may be classified as minimal risk based on the severity of the depressive symptoms    Plan Of Care/Follow-up recommendations:  Activity:  as tolerated Diet:  heart healthy  Oneta Rack, NP 05/20/2019, 11:49 AM

## 2019-05-28 ENCOUNTER — Emergency Department (HOSPITAL_COMMUNITY): Payer: Self-pay

## 2019-05-28 ENCOUNTER — Other Ambulatory Visit: Payer: Self-pay

## 2019-05-28 ENCOUNTER — Emergency Department (HOSPITAL_COMMUNITY)
Admission: EM | Admit: 2019-05-28 | Discharge: 2019-05-29 | Disposition: A | Discharge status: Home / Self Care | Payer: Self-pay | Attending: Emergency Medicine | Admitting: Emergency Medicine

## 2019-05-28 ENCOUNTER — Encounter (HOSPITAL_COMMUNITY): Payer: Self-pay

## 2019-05-28 DIAGNOSIS — E119 Type 2 diabetes mellitus without complications: Secondary | ICD-10-CM | POA: Insufficient documentation

## 2019-05-28 DIAGNOSIS — Z7984 Long term (current) use of oral hypoglycemic drugs: Secondary | ICD-10-CM | POA: Insufficient documentation

## 2019-05-28 DIAGNOSIS — Z7901 Long term (current) use of anticoagulants: Secondary | ICD-10-CM | POA: Insufficient documentation

## 2019-05-28 DIAGNOSIS — L089 Local infection of the skin and subcutaneous tissue, unspecified: Secondary | ICD-10-CM | POA: Insufficient documentation

## 2019-05-28 DIAGNOSIS — Z79899 Other long term (current) drug therapy: Secondary | ICD-10-CM | POA: Insufficient documentation

## 2019-05-28 DIAGNOSIS — I10 Essential (primary) hypertension: Secondary | ICD-10-CM | POA: Insufficient documentation

## 2019-05-28 DIAGNOSIS — Z9889 Other specified postprocedural states: Secondary | ICD-10-CM | POA: Insufficient documentation

## 2019-05-28 DIAGNOSIS — F17210 Nicotine dependence, cigarettes, uncomplicated: Secondary | ICD-10-CM | POA: Insufficient documentation

## 2019-05-28 DIAGNOSIS — Z8616 Personal history of COVID-19: Secondary | ICD-10-CM | POA: Insufficient documentation

## 2019-05-28 LAB — CBC
HCT: 43.7 % (ref 39.0–52.0)
Hemoglobin: 13.9 g/dL (ref 13.0–17.0)
MCH: 29 pg (ref 26.0–34.0)
MCHC: 31.8 g/dL (ref 30.0–36.0)
MCV: 91.2 fL (ref 80.0–100.0)
Platelets: 222 10*3/uL (ref 150–400)
RBC: 4.79 MIL/uL (ref 4.22–5.81)
RDW: 13.3 % (ref 11.5–15.5)
WBC: 4.6 10*3/uL (ref 4.0–10.5)
nRBC: 0 % (ref 0.0–0.2)

## 2019-05-28 LAB — CBG MONITORING, ED: Glucose-Capillary: 98 mg/dL (ref 70–99)

## 2019-05-28 MED ORDER — ONDANSETRON HCL 4 MG PO TABS
4.0000 mg | ORAL_TABLET | Freq: Once | ORAL | Status: AC
  Administered 2019-05-28: 4 mg via ORAL
  Filled 2019-05-28: qty 1

## 2019-05-28 MED ORDER — HYDROCODONE-ACETAMINOPHEN 5-325 MG PO TABS
1.0000 | ORAL_TABLET | Freq: Once | ORAL | Status: AC
  Administered 2019-05-28: 1 via ORAL
  Filled 2019-05-28: qty 1

## 2019-05-28 NOTE — ED Triage Notes (Signed)
Patient reports he broke his finger three months ago and had surgery where pins were placed. Reporting on pin came out and another is still in place but causing pain over the last week. Last tylenol an hour ago. Open wound on top of knuckle.
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
## 2019-05-29 LAB — BASIC METABOLIC PANEL
Anion gap: 7 (ref 5–15)
BUN: 11 mg/dL (ref 6–20)
CO2: 25 mmol/L (ref 22–32)
Calcium: 9.2 mg/dL (ref 8.9–10.3)
Chloride: 110 mmol/L (ref 98–111)
Creatinine, Ser: 0.92 mg/dL (ref 0.61–1.24)
GFR calc Af Amer: >60 mL/min (ref >60)
GFR calc non Af Amer: >60 mL/min (ref >60)
Glucose, Bld: 98 mg/dL (ref 70–99)
Potassium: 4.4 mmol/L (ref 3.5–5.1)
Sodium: 142 mmol/L (ref 135–145)

## 2019-05-29 MED ORDER — AMOXICILLIN-POT CLAVULANATE 875-125 MG PO TABS
1.0000 | ORAL_TABLET | Freq: Once | ORAL | Status: AC
  Administered 2019-05-29: 1 via ORAL
  Filled 2019-05-29: qty 1

## 2019-05-29 MED ORDER — AMOXICILLIN-POT CLAVULANATE 875-125 MG PO TABS: 1.0000 | ORAL_TABLET | Freq: Two times a day (BID) | ORAL | 0 refills | Status: DC

## 2019-05-29 MED ORDER — HYDROCODONE-ACETAMINOPHEN 5-325 MG PO TABS: 1.0000 | ORAL_TABLET | Freq: Four times a day (QID) | ORAL | 0 refills | Status: DC | PRN

## 2019-05-29 NOTE — Discharge Instructions (Signed)
If your symptoms get worse it would be best for you to go to the ER at [facility]) where you can follow up with their orthopedic service, however you may also follow up with [place] at any time.  Get help right away if: You have rapid breathing or you have trouble breathing. You have chest pain. You cannot drink fluids or make urine. The affected area swells, changes color, or turns blue. You have numbness or severe pain in the affected area.

## 2019-07-18 MED ORDER — SERTRALINE HCL 50 MG PO TABS
50.00 | ORAL_TABLET | ORAL | Status: DC
Start: 2019-07-19 — End: 2019-07-18

## 2019-07-18 MED ORDER — HALOPERIDOL 5 MG PO TABS
5.00 | ORAL_TABLET | ORAL | Status: DC
Start: ? — End: 2019-07-18

## 2019-07-18 MED ORDER — TRAZODONE HCL 50 MG PO TABS
50.00 | ORAL_TABLET | ORAL | Status: DC
Start: ? — End: 2019-07-18

## 2019-07-18 MED ORDER — QUINTABS PO TABS
1.00 | ORAL_TABLET | ORAL | Status: DC
Start: 2019-07-19 — End: 2019-07-18

## 2019-07-18 MED ORDER — DIPHENHYDRAMINE HCL 50 MG/ML IJ SOLN
50.00 | INTRAMUSCULAR | Status: DC
Start: ? — End: 2019-07-18

## 2019-07-18 MED ORDER — ONDANSETRON 8 MG PO TBDP
8.00 | ORAL_TABLET | ORAL | Status: DC
Start: ? — End: 2019-07-18

## 2019-07-18 MED ORDER — BENZOCAINE-MENTHOL 6-10 MG MT LOZG
1.00 | LOZENGE | OROMUCOSAL | Status: DC
Start: ? — End: 2019-07-18

## 2019-07-18 MED ORDER — CLONIDINE HCL 0.1 MG PO TABS
0.10 | ORAL_TABLET | ORAL | Status: DC
Start: ? — End: 2019-07-18

## 2019-07-18 MED ORDER — NICOTINE POLACRILEX 2 MG MT GUM
2.00 | CHEWING_GUM | OROMUCOSAL | Status: DC
Start: ? — End: 2019-07-18

## 2019-07-18 MED ORDER — LOPERAMIDE HCL 2 MG PO CAPS
2.00 | ORAL_CAPSULE | ORAL | Status: DC
Start: ? — End: 2019-07-18

## 2019-07-18 MED ORDER — SALINE NASAL SPRAY 0.65 % NA SOLN
1.00 | NASAL | Status: DC
Start: ? — End: 2019-07-18

## 2019-07-18 MED ORDER — GENERIC EXTERNAL MEDICATION
5.00 | Status: DC
Start: ? — End: 2019-07-18

## 2019-07-18 MED ORDER — CETIRIZINE HCL 10 MG PO TABS
10.00 | ORAL_TABLET | ORAL | Status: DC
Start: ? — End: 2019-07-18

## 2019-07-18 MED ORDER — ACETAMINOPHEN 325 MG PO TABS
650.00 | ORAL_TABLET | ORAL | Status: DC
Start: ? — End: 2019-07-18

## 2019-07-18 MED ORDER — BISACODYL 5 MG PO TBEC
10.00 | DELAYED_RELEASE_TABLET | ORAL | Status: DC
Start: ? — End: 2019-07-18

## 2019-07-18 MED ORDER — ALUM & MAG HYDROXIDE-SIMETH 200-200-20 MG/5ML PO SUSP
30.00 | ORAL | Status: DC
Start: ? — End: 2019-07-18

## 2019-07-18 MED ORDER — GUAIFENESIN 100 MG/5ML PO SYRP
200.00 | ORAL_SOLUTION | ORAL | Status: DC
Start: ? — End: 2019-07-18

## 2019-07-18 MED ORDER — HYDROXYZINE HCL 25 MG PO TABS
50.00 | ORAL_TABLET | ORAL | Status: DC
Start: ? — End: 2019-07-18

## 2019-07-18 MED ORDER — LORAZEPAM 2 MG/ML IJ SOLN
2.00 | INTRAMUSCULAR | Status: DC
Start: ? — End: 2019-07-18

## 2019-07-18 MED ORDER — DIPHENHYDRAMINE HCL 25 MG PO CAPS
50.00 | ORAL_CAPSULE | ORAL | Status: DC
Start: ? — End: 2019-07-18

## 2019-09-22 ENCOUNTER — Emergency Department (HOSPITAL_COMMUNITY)
Admission: EM | Admit: 2019-09-22 | Discharge: 2019-09-22 | Disposition: A | Discharge status: Home / Self Care | Payer: Self-pay | Attending: Emergency Medicine | Admitting: Emergency Medicine

## 2019-09-22 ENCOUNTER — Emergency Department (HOSPITAL_COMMUNITY): Payer: Self-pay

## 2019-09-22 ENCOUNTER — Other Ambulatory Visit: Payer: Self-pay

## 2019-09-22 ENCOUNTER — Encounter (HOSPITAL_COMMUNITY): Payer: Self-pay | Admitting: *Deleted

## 2019-09-22 ENCOUNTER — Emergency Department (HOSPITAL_COMMUNITY)
Admission: EM | Admit: 2019-09-22 | Discharge: 2019-09-23 | Disposition: A | Discharge status: Other Acute Inpatient Hospital | Payer: Self-pay | Attending: Emergency Medicine | Admitting: Emergency Medicine

## 2019-09-22 ENCOUNTER — Encounter (HOSPITAL_COMMUNITY): Payer: Self-pay | Admitting: Emergency Medicine

## 2019-09-22 DIAGNOSIS — F17210 Nicotine dependence, cigarettes, uncomplicated: Secondary | ICD-10-CM | POA: Insufficient documentation

## 2019-09-22 DIAGNOSIS — E119 Type 2 diabetes mellitus without complications: Secondary | ICD-10-CM | POA: Insufficient documentation

## 2019-09-22 DIAGNOSIS — Z7901 Long term (current) use of anticoagulants: Secondary | ICD-10-CM | POA: Insufficient documentation

## 2019-09-22 DIAGNOSIS — F315 Bipolar disorder, current episode depressed, severe, with psychotic features: Secondary | ICD-10-CM | POA: Insufficient documentation

## 2019-09-22 DIAGNOSIS — S63259A Unspecified dislocation of unspecified finger, initial encounter: Secondary | ICD-10-CM

## 2019-09-22 DIAGNOSIS — Z720 Tobacco use: Secondary | ICD-10-CM | POA: Insufficient documentation

## 2019-09-22 DIAGNOSIS — Y929 Unspecified place or not applicable: Secondary | ICD-10-CM | POA: Insufficient documentation

## 2019-09-22 DIAGNOSIS — Z79899 Other long term (current) drug therapy: Secondary | ICD-10-CM | POA: Insufficient documentation

## 2019-09-22 DIAGNOSIS — R45851 Suicidal ideations: Secondary | ICD-10-CM | POA: Insufficient documentation

## 2019-09-22 DIAGNOSIS — Y939 Activity, unspecified: Secondary | ICD-10-CM | POA: Insufficient documentation

## 2019-09-22 DIAGNOSIS — S63267A Dislocation of metacarpophalangeal joint of left little finger, initial encounter: Secondary | ICD-10-CM | POA: Insufficient documentation

## 2019-09-22 DIAGNOSIS — Z20822 Contact with and (suspected) exposure to covid-19: Secondary | ICD-10-CM | POA: Insufficient documentation

## 2019-09-22 DIAGNOSIS — I10 Essential (primary) hypertension: Secondary | ICD-10-CM | POA: Insufficient documentation

## 2019-09-22 DIAGNOSIS — Y999 Unspecified external cause status: Secondary | ICD-10-CM | POA: Insufficient documentation

## 2019-09-22 DIAGNOSIS — G44319 Acute post-traumatic headache, not intractable: Secondary | ICD-10-CM | POA: Insufficient documentation

## 2019-09-22 DIAGNOSIS — R079 Chest pain, unspecified: Secondary | ICD-10-CM | POA: Insufficient documentation

## 2019-09-22 LAB — COMPREHENSIVE METABOLIC PANEL
ALT: 34 U/L (ref 0–44)
AST: 44 U/L — ABNORMAL HIGH (ref 15–41)
Albumin: 3 g/dL — ABNORMAL LOW (ref 3.5–5.0)
Alkaline Phosphatase: 63 U/L (ref 38–126)
Anion gap: 13 (ref 5–15)
BUN: 16 mg/dL (ref 6–20)
CO2: 22 mmol/L (ref 22–32)
Calcium: 8.6 mg/dL — ABNORMAL LOW (ref 8.9–10.3)
Chloride: 107 mmol/L (ref 98–111)
Creatinine, Ser: 1.2 mg/dL (ref 0.61–1.24)
GFR calc Af Amer: >60 mL/min (ref >60)
GFR calc non Af Amer: >60 mL/min (ref >60)
Glucose, Bld: 128 mg/dL — ABNORMAL HIGH (ref 70–99)
Potassium: 3.6 mmol/L (ref 3.5–5.1)
Sodium: 142 mmol/L (ref 135–145)
Total Bilirubin: 0.5 mg/dL (ref 0.3–1.2)
Total Protein: 5.5 g/dL — ABNORMAL LOW (ref 6.5–8.1)

## 2019-09-22 LAB — CBC
HCT: 42.7 % (ref 39.0–52.0)
Hemoglobin: 13.1 g/dL (ref 13.0–17.0)
MCH: 30.1 pg (ref 26.0–34.0)
MCHC: 30.7 g/dL (ref 30.0–36.0)
MCV: 98.2 fL (ref 80.0–100.0)
Platelets: 194 10*3/uL (ref 150–400)
RBC: 4.35 MIL/uL (ref 4.22–5.81)
RDW: 13.6 % (ref 11.5–15.5)
WBC: 6.1 10*3/uL (ref 4.0–10.5)
nRBC: 0 % (ref 0.0–0.2)

## 2019-09-22 LAB — ETHANOL: Alcohol, Ethyl (B): <10 mg/dL (ref <10)

## 2019-09-22 LAB — CBG MONITORING, ED: Glucose-Capillary: 93 mg/dL (ref 70–99)

## 2019-09-22 MED ORDER — LIDOCAINE-EPINEPHRINE (PF) 2 %-1:200000 IJ SOLN: 10.0000 mL | Freq: Once | INTRAMUSCULAR | Status: DC

## 2019-09-22 MED ORDER — ACETAMINOPHEN 500 MG PO TABS
1000.0000 mg | ORAL_TABLET | Freq: Once | ORAL | Status: AC
  Administered 2019-09-22: 1000 mg via ORAL
  Filled 2019-09-22: qty 2

## 2019-09-22 MED ORDER — KETOROLAC TROMETHAMINE 60 MG/2ML IM SOLN
15.0000 mg | Freq: Once | INTRAMUSCULAR | Status: AC
  Administered 2019-09-22: 15 mg via INTRAMUSCULAR
  Filled 2019-09-22: qty 2

## 2019-09-22 MED ORDER — LORAZEPAM 2 MG/ML IJ SOLN
1.0000 mg | Freq: Once | INTRAMUSCULAR | Status: AC
  Administered 2019-09-22: 1 mg via INTRAMUSCULAR
  Filled 2019-09-22: qty 1

## 2019-09-22 MED ORDER — OXYCODONE HCL 5 MG PO TABS
5.0000 mg | ORAL_TABLET | Freq: Once | ORAL | Status: AC
  Administered 2019-09-22: 5 mg via ORAL
  Filled 2019-09-22: qty 1

## 2019-09-22 NOTE — Discharge Instructions (Signed)
Take 4 over the counter ibuprofen tablets 3 times a day or 2 over-the-counter naproxen tablets twice a day for pain. Also take tylenol 1000mg(2 extra strength) four times a day.    
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
## 2019-09-22 NOTE — Progress Notes (Signed)
Orthopedic Tech Progress Note Patient Details:  Dale [name] March 14, 1966 [id number] MD wanted Ulnar Gutter splint applied Ortho Devices Type of Ortho Device: Ulna gutter splint Ortho Device/Splint Location: ULE Ortho Device/Splint Interventions: Application, Ordered   Post Interventions Patient Tolerated: Well Instructions Provided: Care of device   Dale [name] Dale [name] 09/22/2019, 9:21 AM
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
## 2019-09-22 NOTE — ED Notes (Signed)
Dr. Nicanor Alcon called, gave verbal for CT head
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
## 2019-09-23 ENCOUNTER — Encounter (HOSPITAL_COMMUNITY): Payer: Self-pay | Admitting: Psychiatry

## 2019-09-23 ENCOUNTER — Other Ambulatory Visit: Payer: Self-pay

## 2019-09-23 ENCOUNTER — Inpatient Hospital Stay (HOSPITAL_COMMUNITY)
Admit: 2019-09-23 | Discharge: 2019-10-01 | DRG: 885 | Disposition: A | Discharge status: Home / Self Care | Payer: Federal, State, Local not specified - Other | Source: Other Acute Inpatient Hospital | Attending: Psychiatry | Admitting: Psychiatry

## 2019-09-23 DIAGNOSIS — R52 Pain, unspecified: Secondary | ICD-10-CM

## 2019-09-23 DIAGNOSIS — Z915 Personal history of self-harm: Secondary | ICD-10-CM

## 2019-09-23 DIAGNOSIS — Z86718 Personal history of other venous thrombosis and embolism: Secondary | ICD-10-CM

## 2019-09-23 DIAGNOSIS — F323 Major depressive disorder, single episode, severe with psychotic features: Principal | ICD-10-CM | POA: Diagnosis present

## 2019-09-23 DIAGNOSIS — F4310 Post-traumatic stress disorder, unspecified: Secondary | ICD-10-CM | POA: Diagnosis present

## 2019-09-23 DIAGNOSIS — Z86711 Personal history of pulmonary embolism: Secondary | ICD-10-CM

## 2019-09-23 DIAGNOSIS — F17210 Nicotine dependence, cigarettes, uncomplicated: Secondary | ICD-10-CM | POA: Diagnosis present

## 2019-09-23 DIAGNOSIS — F319 Bipolar disorder, unspecified: Secondary | ICD-10-CM | POA: Diagnosis present

## 2019-09-23 DIAGNOSIS — G4700 Insomnia, unspecified: Secondary | ICD-10-CM | POA: Diagnosis present

## 2019-09-23 DIAGNOSIS — M79642 Pain in left hand: Secondary | ICD-10-CM | POA: Diagnosis present

## 2019-09-23 DIAGNOSIS — Z20822 Contact with and (suspected) exposure to covid-19: Secondary | ICD-10-CM | POA: Diagnosis present

## 2019-09-23 DIAGNOSIS — F1410 Cocaine abuse, uncomplicated: Secondary | ICD-10-CM | POA: Diagnosis present

## 2019-09-23 DIAGNOSIS — I10 Essential (primary) hypertension: Secondary | ICD-10-CM | POA: Diagnosis present

## 2019-09-23 DIAGNOSIS — Z7901 Long term (current) use of anticoagulants: Secondary | ICD-10-CM

## 2019-09-23 DIAGNOSIS — E119 Type 2 diabetes mellitus without complications: Secondary | ICD-10-CM | POA: Diagnosis present

## 2019-09-23 DIAGNOSIS — R45851 Suicidal ideations: Secondary | ICD-10-CM | POA: Diagnosis present

## 2019-09-23 DIAGNOSIS — M25532 Pain in left wrist: Secondary | ICD-10-CM | POA: Diagnosis present

## 2019-09-23 LAB — SARS CORONAVIRUS 2 BY RT PCR (HOSPITAL ORDER, PERFORMED IN ~~LOC~~ HOSPITAL LAB): SARS Coronavirus 2: NEGATIVE

## 2019-09-23 MED ORDER — OLANZAPINE 5 MG PO TABS
5.0000 mg | ORAL_TABLET | Freq: Every day | ORAL | Status: DC
  Administered 2019-09-23 – 2019-09-27 (×5): 5 mg via ORAL
  Filled 2019-09-23: qty 2
  Filled 2019-09-23 (×7): qty 1

## 2019-09-23 MED ORDER — METFORMIN HCL 500 MG PO TABS
500.0000 mg | ORAL_TABLET | Freq: Every day | ORAL | Status: DC
  Administered 2019-09-24 – 2019-09-30 (×7): 500 mg via ORAL
  Filled 2019-09-23: qty 14
  Filled 2019-09-23 (×5): qty 1
  Filled 2019-09-23: qty 14
  Filled 2019-09-23 (×2): qty 1
  Filled 2019-09-23: qty 14
  Filled 2019-09-23 (×2): qty 1

## 2019-09-23 MED ORDER — ACETAMINOPHEN 325 MG PO TABS
650.0000 mg | ORAL_TABLET | Freq: Four times a day (QID) | ORAL | Status: DC | PRN
  Administered 2019-09-25 – 2019-09-29 (×4): 650 mg via ORAL
  Filled 2019-09-23 (×4): qty 2

## 2019-09-23 MED ORDER — MAGNESIUM HYDROXIDE 400 MG/5ML PO SUSP: 30.0000 mL | Freq: Every day | ORAL | Status: DC | PRN

## 2019-09-23 MED ORDER — ALUM & MAG HYDROXIDE-SIMETH 200-200-20 MG/5ML PO SUSP
30.0000 mL | ORAL | Status: DC | PRN
  Administered 2019-09-30: 30 mL via ORAL

## 2019-09-23 MED ORDER — GABAPENTIN 300 MG PO CAPS
300.0000 mg | ORAL_CAPSULE | Freq: Three times a day (TID) | ORAL | Status: DC
  Administered 2019-09-23 – 2019-09-30 (×22): 300 mg via ORAL
  Filled 2019-09-23 (×3): qty 1
  Filled 2019-09-23: qty 42
  Filled 2019-09-23: qty 1
  Filled 2019-09-23: qty 42
  Filled 2019-09-23 (×6): qty 1
  Filled 2019-09-23 (×2): qty 42
  Filled 2019-09-23 (×7): qty 1
  Filled 2019-09-23: qty 42
  Filled 2019-09-23: qty 1
  Filled 2019-09-23: qty 42
  Filled 2019-09-23 (×2): qty 1
  Filled 2019-09-23: qty 42
  Filled 2019-09-23 (×3): qty 1
  Filled 2019-09-23: qty 42
  Filled 2019-09-23 (×6): qty 1
  Filled 2019-09-23: qty 42

## 2019-09-23 MED ORDER — CITALOPRAM HYDROBROMIDE 10 MG PO TABS
10.0000 mg | ORAL_TABLET | Freq: Every day | ORAL | Status: DC
  Administered 2019-09-23 – 2019-09-25 (×3): 10 mg via ORAL
  Filled 2019-09-23 (×5): qty 1

## 2019-09-23 MED ORDER — APIXABAN 2.5 MG PO TABS
2.5000 mg | ORAL_TABLET | Freq: Two times a day (BID) | ORAL | Status: DC
  Administered 2019-09-23 – 2019-09-30 (×16): 2.5 mg via ORAL
  Filled 2019-09-23 (×4): qty 1
  Filled 2019-09-23: qty 14
  Filled 2019-09-23 (×2): qty 1
  Filled 2019-09-23 (×2): qty 14
  Filled 2019-09-23 (×7): qty 1
  Filled 2019-09-23: qty 14
  Filled 2019-09-23 (×2): qty 1
  Filled 2019-09-23: qty 14
  Filled 2019-09-23: qty 1
  Filled 2019-09-23: qty 14
  Filled 2019-09-23 (×3): qty 1

## 2019-09-23 MED ORDER — HYDROXYZINE HCL 25 MG PO TABS
25.0000 mg | ORAL_TABLET | Freq: Three times a day (TID) | ORAL | Status: DC | PRN
  Administered 2019-09-24 – 2019-09-29 (×4): 25 mg via ORAL
  Filled 2019-09-23: qty 1
  Filled 2019-09-23: qty 20
  Filled 2019-09-23 (×4): qty 1

## 2019-09-23 NOTE — [facility] Suicide Risk Assessment (Signed)
[facility] INPATIENT:  Family/Significant Other Suicide Prevention Education  Suicide Prevention Education:  Patient Refusal for Family/Significant Other Suicide Prevention Education: The patient Dale [name] has refused to provide written consent for family/significant other to be provided Family/Significant Other Suicide Prevention Education during admission and/or prior to discharge.  Physician notified.  Carloyn Jaeger Grossman-Orr 09/23/2019, 2:28 PM

## 2019-09-23 NOTE — ED Notes (Signed)
Regular breakfast tray ordered.  

## 2019-09-23 NOTE — Progress Notes (Signed)
   09/23/19 2212  COVID-19 Daily Checkoff  Have you had a fever (temp > 37.80C/100F)  in the past 24 hours?  No  If you have had runny nose, nasal congestion, sneezing in the past 24 hours, has it worsened? No  COVID-19 EXPOSURE  Have you traveled outside the state in the past 14 days? No  Have you been in contact with someone with a confirmed diagnosis of COVID-19 or PUI in the past 14 days without wearing appropriate PPE? No  Have you been living in the same home as a person with confirmed diagnosis of COVID-19 or a PUI (household contact)? No  Have you been diagnosed with COVID-19? No
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
## 2019-09-23 NOTE — ED Notes (Signed)
[facility] arrived and patient taken to car in wheelchair by RN with all personal belongings, medications, and paperwork.
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
## 2019-09-23 NOTE — ED Provider Notes (Signed)
TTS consultation is appreciated. Patient has been accepted at [facility] for inpatient care.   Dione Booze, MD 09/23/19 [phone]
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
## 2019-09-23 NOTE — ED Notes (Signed)
Report given to [facility] RN. [facility] called and on the way to retreive patient.
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
## 2019-09-23 NOTE — [facility] Group Notes (Signed)
Pt did not attend wrap up group this evening. Pt was in bed sleeping.  
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
## 2019-09-23 NOTE — Progress Notes (Signed)
   09/23/19 0643  Psych Admission Type (Psych Patients Only)  Admission Status Voluntary  Psychosocial Assessment  Patient Complaints Anxiety  Eye Contact Fair  Facial Expression Pained  Affect Appropriate to circumstance  Speech Logical/coherent  Interaction Assertive  Motor Activity Other (Comment)  Appearance/Hygiene In scrubs  Behavior Characteristics Appropriate to situation  Mood Depressed  Thought Process  Coherency WDL  Content WDL  Delusions None reported or observed  Perception WDL  Hallucination Auditory;Visual  Judgment Impaired  Confusion None  Danger to Self  Current suicidal ideation? Denies  Danger to Others  Danger to Others None reported or observed

## 2019-09-23 NOTE — B[facility] Group Notes (Signed)
[facility] Group Notes: (Clinical Social Work)   09/23/2019      Type of Therapy:  Group Therapy   Participation Level:  Did Not Attend - was invited both individually by [facility] and by overhead announcement, chose not to attend.   Ambrose Mantle, LCSW 09/23/2019, 12:47 PM

## 2019-09-24 LAB — LIPID PANEL
Cholesterol: 118 mg/dL (ref 0–200)
HDL: 48 mg/dL (ref >40)
LDL Cholesterol: 58 mg/dL (ref 0–99)
Total CHOL/HDL Ratio: 2.5 RATIO
Triglycerides: 61 mg/dL (ref <150)
VLDL: 12 mg/dL (ref 0–40)

## 2019-09-24 LAB — TSH: TSH: 0.661 u[IU]/mL (ref 0.350–4.500)

## 2019-09-24 MED ORDER — DICLOFENAC SODIUM 1 % EX GEL
2.0000 g | Freq: Three times a day (TID) | CUTANEOUS | Status: DC | PRN
  Administered 2019-09-24 – 2019-09-27 (×6): 2 g via TOPICAL
  Filled 2019-09-24: qty 100

## 2019-09-24 MED ORDER — TRAMADOL HCL 50 MG PO TABS: 50.0000 mg | ORAL_TABLET | Freq: Two times a day (BID) | ORAL | Status: DC | PRN

## 2019-09-24 NOTE — BHH Counselor (Signed)
CSW faxed referral to [facility].  [facility] Authorization: [id number] Valid: 06/21 to 06/27  Enid Cutter, MSW, LCSW-A Clinical Social Worker [facility] Adult Unit

## 2019-09-24 NOTE — Progress Notes (Signed)
Pt denied SI/HI/AVH. Stated that he was depressed and anxious.    RN established rapport with pt and assessed for needs concerns.  Pt stated that he was in pain and declined Tylenol.  RN informed MD re: pt's report of pain and declining ordered tylenol.  Q 15 min safety checks maintained.  Pt taking medications without incident.  Pt spending most of the day in his room and appears to be sleeping on and off.  RN will continue to monitor and provide support as needed.

## 2019-09-24 NOTE — BHH Counselor (Addendum)
CSW spoke with admissions director at [facility] regarding return and discharge planning. Per policy, patient may not return for at least 6 months. Patient informed and requested referrals for residential substance use treatment programs.  Enid Cutter, MSW, LCSW-A Clinical Social Worker [facility] Adult Unit

## 2019-09-24 NOTE — B[facility] Group Notes (Signed)
LCSW Group Therapy Note  Type of Therapy/Topic: Group Therapy: Six Dimensions of Wellness  Participation Level: Did Not Attend  Description of Group: This group will address the concept of wellness and the six concepts of wellness: occupational, physical, social, intellectual, spiritual, and emotional. Patients will be encouraged to process areas in their lives that are out of balance and identify reasons for remaining unbalanced. Patients will be encouraged to explore ways to practice healthy habits on a daily basis to attain better physical and mental health outcomes.  Therapeutic Goals: 1. Identify aspects of wellness that they are doing well. 2. Identify aspects of wellness that they would like to improve upon. 3. Identify one action they can take to improve an aspect of wellness in their lives.    Summary of Patient Progress: Invited, did not attend.  Therapeutic Modalities: Cognitive Behavioral Therapy Solution-Focused Therapy Relapse Prevention  

## 2019-09-24 NOTE — Tx Team (Cosign Needed)
Interdisciplinary Treatment and Diagnostic Plan Update  09/24/2019 Time of Session: 9:00am Dale [name] MRN: [id number]  Principal Diagnosis: Severe major depression with psychotic features, mood-congruent (HCC)  Secondary Diagnoses: Principal Problem:   Severe major depression with psychotic features, mood-congruent (HCC) Active Problems:   Bipolar 1 disorder (HCC)   Current Medications:  Current Facility-Administered Medications  Medication Dose Route Frequency Provider Last Rate Last Admin  . acetaminophen (TYLENOL) tablet 650 mg  650 mg Oral Q6H PRN Nira Conn A, NP      . alum & mag hydroxide-simeth (MAALOX/MYLANTA) 200-200-20 MG/5ML suspension 30 mL  30 mL Oral Q4H PRN Nira Conn A, NP      . apixaban (ELIQUIS) tablet 2.5 mg  2.5 mg Oral BID Cobos, Rockey Situ, MD   2.5 mg at 09/24/19 0850  . citalopram (CELEXA) tablet 10 mg  10 mg Oral Daily Cobos, Rockey Situ, MD   10 mg at 09/24/19 0849  . gabapentin (NEURONTIN) capsule 300 mg  300 mg Oral TID Cobos, Rockey Situ, MD   300 mg at 09/24/19 0850  . hydrOXYzine (ATARAX/VISTARIL) tablet 25 mg  25 mg Oral TID PRN Nira Conn A, NP      . magnesium hydroxide (MILK OF MAGNESIA) suspension 30 mL  30 mL Oral Daily PRN Nira Conn A, NP      . metFORMIN (GLUCOPHAGE) tablet 500 mg  500 mg Oral Q breakfast Cobos, Rockey Situ, MD   500 mg at 09/24/19 0850  . OLANZapine (ZYPREXA) tablet 5 mg  5 mg Oral QHS Cobos, Rockey Situ, MD   5 mg at 09/23/19 2140   PTA Medications: Medications Prior to Admission  Medication Sig Dispense Refill Last Dose  . apixaban (ELIQUIS) 2.5 MG TABS tablet Take 1 tablet (2.5 mg total) by mouth 2 (two) times daily. For prevention of blood clot 60 tablet 0   . ARIPiprazole (ABILIFY) 10 MG tablet Take 10 mg by mouth at bedtime.     . diphenhydrAMINE (BENADRYL) 25 mg capsule Take 25 mg by mouth at bedtime as needed for allergies.     Marland Kitchen docusate sodium (COLACE) 100 MG capsule Take 1 capsule (100 mg total) by  mouth daily as needed for mild constipation. (May buy from over the counter): For constipation (Patient taking differently: Take 100 mg by mouth 2 (two) times daily as needed for mild constipation. (May buy from over the counter): For constipation) 10 capsule 0   . hydrOXYzine (ATARAX/VISTARIL) 25 MG tablet Take 1 tablet (25 mg total) by mouth 3 (three) times daily as needed for anxiety. (Patient taking differently: Take 25 mg by mouth every 6 (six) hours as needed for anxiety. ) 75 tablet 0     Patient Stressors: Health problems Traumatic event  Patient Strengths: Average or above average intelligence Communication skills Work skills  Treatment Modalities: Medication Management, Group therapy, Case management,  1 to 1 session with clinician, Psychoeducation, Recreational therapy.   Physician Treatment Plan for Primary Diagnosis: Severe major depression with psychotic features, mood-congruent (HCC) Long Term Goal(s):     Short Term Goals:    Medication Management: Evaluate patient's response, side effects, and tolerance of medication regimen.  Therapeutic Interventions: 1 to 1 sessions, Unit Group sessions and Medication administration.  Evaluation of Outcomes: Progressing  Physician Treatment Plan for Secondary Diagnosis: Principal Problem:   Severe major depression with psychotic features, mood-congruent (HCC) Active Problems:   Bipolar 1 disorder (HCC)  Long Term Goal(s):     Short Term Goals:  Medication Management: Evaluate patient's response, side effects, and tolerance of medication regimen.  Therapeutic Interventions: 1 to 1 sessions, Unit Group sessions and Medication administration.  Evaluation of Outcomes: Progressing   RN Treatment Plan for Primary Diagnosis: Severe major depression with psychotic features, mood-congruent ([place]) Long Term Goal(s): Knowledge of disease and therapeutic regimen to maintain health will improve  Short Term Goals: Ability to  participate in decision making will improve, Ability to identify and develop effective coping behaviors will improve and Compliance with prescribed medications will improve  Medication Management: RN will administer medications as ordered by provider, will assess and evaluate patient's response and provide education to patient for prescribed medication. RN will report any adverse and/or side effects to prescribing provider.  Therapeutic Interventions: 1 on 1 counseling sessions, Psychoeducation, Medication administration, Evaluate responses to treatment, Monitor vital signs and CBGs as ordered, Perform/monitor CIWA, COWS, AIMS and Fall Risk screenings as ordered, Perform wound care treatments as ordered.  Evaluation of Outcomes: Progressing   LCSW Treatment Plan for Primary Diagnosis: Severe major depression with psychotic features, mood-congruent ([place]) Long Term Goal(s): Safe transition to appropriate next level of care at discharge, Engage patient in therapeutic group addressing interpersonal concerns.  Short Term Goals: Engage patient in aftercare planning with referrals and resources, Increase social support, Increase emotional regulation and Increase skills for wellness and recovery  Therapeutic Interventions: Assess for all discharge needs, 1 to 1 time with Social worker, Explore available resources and support systems, Assess for adequacy in community support network, Educate family and significant other(s) on suicide prevention, Complete Psychosocial Assessment, Interpersonal group therapy.  Evaluation of Outcomes: Progressing   Progress in Treatment: Attending groups: No. New to unit. Participating in groups: No. Taking medication as prescribed: Yes. Toleration medication: Yes. Family/Significant other contact made: No, will contact:  declined consents, SPE reviewed with patient. Patient understands diagnosis: Yes. Discussing patient identified problems/goals with staff:  Yes. Medical problems stabilized or resolved: Yes. Denies suicidal/homicidal ideation: Yes. Issues/concerns per patient self-inventory: Yes.  New problem(s) identified: Yes, Describe:  requesting assistance in re-entering his treatment program.  New Short Term/Long Term Goal(s): detox, medication management for mood stabilization; elimination of SI thoughts; development of comprehensive mental wellness/sobriety plan.  Patient Goals:  "Stay focused on what I have."  Discharge Plan or Barriers: Patient requested assistance in re-entering [facility] [place]. Will also need follow up through [place] for Continuation of Hospitalization: Anxiety Depression Medication stabilization  Estimated Length of Stay: 3-5 days  Attendees: Patient: Dale [name] 09/24/2019 9:16 AM  Physician: Dr. Parke Poisson 09/24/2019 9:16 AM  Nursing:  09/24/2019 9:16 AM  RN Care Manager: 09/24/2019 9:16 AM  Social Worker: Stephanie Acre, [place] 09/24/2019 9:16 AM  Recreational Therapist:  09/24/2019 9:16 AM  Other:  09/24/2019 9:16 AM  Other:  09/24/2019 9:16 AM  Other: 09/24/2019 9:16 AM    Scribe for Treatment Team: Joellen [place], [place] 09/24/2019 9:16 AM
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
## 2019-09-25 LAB — HEMOGLOBIN A1C
Hgb A1c MFr Bld: 5.6 % (ref 4.8–5.6)
Mean Plasma Glucose: 114 mg/dL

## 2019-09-25 MED ORDER — CITALOPRAM HYDROBROMIDE 20 MG PO TABS
20.0000 mg | ORAL_TABLET | Freq: Every day | ORAL | Status: DC
  Administered 2019-09-26 – 2019-09-30 (×5): 20 mg via ORAL
  Filled 2019-09-25: qty 14
  Filled 2019-09-25 (×4): qty 1
  Filled 2019-09-25: qty 14
  Filled 2019-09-25 (×4): qty 1
  Filled 2019-09-25: qty 14

## 2019-09-25 MED ORDER — PRAZOSIN HCL 1 MG PO CAPS
1.0000 mg | ORAL_CAPSULE | Freq: Every day | ORAL | Status: DC
  Administered 2019-09-25 – 2019-09-30 (×6): 1 mg via ORAL
  Filled 2019-09-25 (×2): qty 1
  Filled 2019-09-25: qty 14
  Filled 2019-09-25 (×2): qty 1
  Filled 2019-09-25: qty 14
  Filled 2019-09-25 (×2): qty 1
  Filled 2019-09-25: qty 14
  Filled 2019-09-25: qty 1

## 2019-09-25 NOTE — B[facility] Counselor (Addendum)
Patient referral has been received by [facility], but has not been reviewed. Per admissions, there is a waitlist for treatment beds if accepted.  [facility] has received patient's referral but has not reviewed it. There is also a waitlist for treatment beds.  CSW attempted to reach admissions at [facility] by phone, without success.  Enid Cutter, MSW, LCSW-A Clinical Social Worker [facility]
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
## 2019-09-25 NOTE — Progress Notes (Signed)
   09/25/19 1108  Psych Admission Type (Psych Patients Only)  Admission Status Voluntary  Psychosocial Assessment  Patient Complaints Anxiety;Sleep disturbance  Eye Contact Avoids  Facial Expression Animated  Affect Anxious  Speech Logical/coherent  Interaction Assertive  Motor Activity Slow  Appearance/Hygiene Unremarkable  Behavior Characteristics Cooperative;Anxious  Mood Anxious;Irritable  Thought Process  Coherency WDL  Content WDL  Delusions None reported or observed  Perception WDL  Hallucination None reported or observed  Judgment Impaired  Confusion None  Danger to Self  Current suicidal ideation? Denies  Danger to Others  Danger to Others None reported or observed
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
## 2019-09-26 NOTE — Progress Notes (Signed)
D. Pt presents with an anxious affect/ anxious, depressed mood- reports poor sleep last night despite having had sleep medication. Pt is calm and cooperative, brightens a little  during interactions. Per pt's self inventory, pt rated his depression, hopelessness and anxiety an 8/8/7, respectively.  Pt currently denies SI/HI and AVH A. Labs and vitals monitored. Pt compliant with medications. Pt supported emotionally and encouraged to express concerns and ask questions.   R. Pt remains safe with 15 minute checks. Will continue POC.

## 2019-09-26 NOTE — Progress Notes (Signed)
[facility] Group Notes:  (Nursing/MHT/Case Management/Adjunct)  Date:  09/26/2019  Time:  10:45 PM  Type of Therapy:  Wrap up   Participation Level:  Minimal  Participation Quality:  Appropriate  Affect:  Appropriate  Cognitive:  Appropriate  Insight:  Appropriate  Engagement in Group:  Engaged  Modes of Intervention:  Problem-solving  Summary of Progress/Problems: Pt. Rated his day as a 7 out of 10. His goal was getting use to people.  He felt he did not achieve his goal because he did not feel safe around them.  Pt. Was encouraged to speak with his counselor, nurse, Dr. And other staff when he does not feel safe.    Annell Greening [place] 09/26/2019, 10:45 PM

## 2019-09-26 NOTE — Progress Notes (Signed)
   09/25/19 2130  Psych Admission Type (Psych Patients Only)  Admission Status Voluntary  Psychosocial Assessment  Patient Complaints Anger;Irritability;Worrying;Other (Comment) (pain in left arm)  Eye Contact Brief  Facial Expression Animated;Pained  Affect Anxious;Appropriate to circumstance  Speech Logical/coherent  Interaction Assertive  Motor Activity Slow  Appearance/Hygiene Unremarkable  Behavior Characteristics Cooperative;Anxious;Irritable  Mood Anxious;Irritable  Thought Process  Coherency WDL  Content WDL  Delusions None reported or observed  Perception WDL  Hallucination None reported or observed  Judgment Impaired  Confusion None  Danger to Self  Current suicidal ideation? Denies  Danger to Others  Danger to Others None reported or observed
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
## 2019-09-26 NOTE — [facility] Group Notes (Signed)
LCSW Group Therapy Notes  Type of Therapy and Topic: Group Therapy: Healthy Vs. Unhealthy Coping Strategies   Description of Group:  In this group, patients will be encouraged to explore their healthy and unhealthy coping strategics. Coping strategies are actions that we take to deal with stress, problems, or uncomfortable emotions in our daily lives. Each patient will be challenged to read some scenarios and discuss the unhealthy and healthy coping strategies within those scenarios. Also, each patient will be challenged to describe current healthy and unhealthy strategies that they use in their own lives and discuss the outcomes and barriers to those strategies. This group will be process-oriented, with patients participating in exploration of their own experiences as well as giving and receiving support and challenge from other group members.  Therapeutic Goals: 1. Patient will identify personal healthy and unhealthy coping strategies. 2. Patient will identify healthy and unhealthy coping strategies, in others, through scenarios.  3. Patient will identify expected outcomes of healthy and unhealthy coping strategies. 4. Patient will identify barriers to using healthy coping strategies.   Summary of Patient Progress: This is a worksheet based group. Individual feedback and the opportunity to process the content was provided.   Therapeutic Modalities:  Cognitive Behavioral Therapy Solution Focused Therapy Motivational Interviewing   Braelyn Jenson, MSW, LCSW Clinical Social Worker [facility]  Phone: [phone]      

## 2019-09-26 NOTE — Progress Notes (Signed)
   09/25/19 2130  COVID-19 Daily Checkoff  Have you had a fever (temp > 37.80C/100F)  in the past 24 hours?  No  COVID-19 EXPOSURE  Have you traveled outside the state in the past 14 days? No  Have you been in contact with someone with a confirmed diagnosis of COVID-19 or PUI in the past 14 days without wearing appropriate PPE? No  Have you been living in the same home as a person with confirmed diagnosis of COVID-19 or a PUI (household contact)? No  Have you been diagnosed with COVID-19? No

## 2019-09-27 ENCOUNTER — Inpatient Hospital Stay (HOSPITAL_COMMUNITY): Payer: Federal, State, Local not specified - Other
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
## 2019-09-28 MED ORDER — OLANZAPINE 7.5 MG PO TABS
7.5000 mg | ORAL_TABLET | Freq: Every day | ORAL | Status: DC
  Administered 2019-09-28 – 2019-09-30 (×3): 7.5 mg via ORAL
  Filled 2019-09-28: qty 1
  Filled 2019-09-28: qty 14
  Filled 2019-09-28: qty 1
  Filled 2019-09-28: qty 14
  Filled 2019-09-28: qty 1
  Filled 2019-09-28: qty 14
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
## 2019-09-28 NOTE — Progress Notes (Signed)
Pt has been calm and cooperative, complained of generalized pain, denied SI/HI and contracted for safety. Medication given as scheduled, support and encouragement given as needed. Pt has been sleeping well, no complains, will continue to monitor.

## 2019-09-28 NOTE — BHH Counselor (Addendum)
Patient has a residential substance use treatment screening Monday morning (06/28) at 7:45am at [facility]. Patient is aware and agreeable.  CSW coordinated transportation for 7:00am Monday morning. Patient will need to bring a medication supply.   Enid Cutter, MSW, LCSW-A Clinical Social Worker [facility]

## 2019-09-28 NOTE — Progress Notes (Signed)
Recreation Therapy Notes  Date:  6.25.21 Time: 0930 Location: 300 Hall Dayroom  Group Topic: Stress Management  Goal Area(s) Addresses:  Patient will identify positive stress management techniques. Patient will identify benefits of using stress management post d/c.  Behavioral Response: Engaged  Intervention: Stress Management  Activity : Guided Imagery.  LRT read a script that allowed patients to envision their peaceful place.  Patients were to listen and follow along as script was read to engage in activity.  Education:  Stress Management, Discharge Planning.   Education Outcome: Acknowledges Education  Clinical Observations/Feedback: Pt attended and participated in activity.     Caroll Rancher, LRT/CTRS         Caroll Rancher A 09/28/2019 11:52 AM

## 2019-09-28 NOTE — Progress Notes (Signed)
   09/28/19 2333  Psych Admission Type (Psych Patients Only)  Admission Status Voluntary  Psychosocial Assessment  Patient Complaints Anxiety  Eye Contact Fair  Facial Expression Anxious;Sullen;Sad;Worried  Affect Anxious;Depressed;Sad;Sullen  Speech Logical/coherent  Interaction Assertive  Motor Activity Slow;Unsteady  Appearance/Hygiene Unremarkable  Behavior Characteristics Cooperative  Mood Anxious  Thought Process  Coherency WDL  Content WDL  Delusions None reported or observed  Perception WDL  Hallucination Visual  Judgment Poor  Confusion None  Danger to Self  Current suicidal ideation? Denies  Danger to Others  Danger to Others None reported or observed  D: Patient in dayroom reports he had a good day.  A: Medications administered as prescribed. Support and encouragement provided as needed.  R: Patient remains safe on the unit. Will continue to monitor for safety and stability.

## 2019-09-28 NOTE — B[facility] Group Notes (Signed)
09/28/2019 8:45am Type of Group and Topic: Psychoeducational Group: Discharge Planning  Participation Level: Active  Description of Group Discharge planning group reviews patient's anticipated discharge plans and assists patients to anticipate and address any barriers to wellness/recovery in the community. Suicide prevention education is reviewed with patients in group. Therapeutic Goals 1. Patients will state their anticipated discharge plan and mental health aftercare 2. Patients will identify potential barriers to wellness in the community setting 3. Patients will engage in problem solving, solution focused discussion of ways to anticipate and address barriers to wellness/recovery   Summary of Patient Progress Plan for Discharge/Comments: Patient reports he does not have any concerns regarding his discharge plans. He is discharging to [facility] for residential treatment services on Monday.   Transportation Means: CSW will arrange safe transportation   Supports: None    Therapeutic Modalities: Motivational Interviewing     Baldo Daub, MSW, [employer] Clinical Social Worker [facility]  Phone: [phone] 09/28/2019 2:04 PM

## 2019-09-28 NOTE — BHH Group Notes (Signed)
Adult Occupational Therapy Group Note  Date:  09/28/2019 Time:  3:59 PM  Group Topic/Focus:  Self-Esteem  Participation Level:  None  Participation Quality:  Drowsy and Inattentive  Affect:  Asleep  Cognitive:  UTA - Patient asleep  Insight: Limited  Engagement in Group:  None  Modes of Intervention:  Activity, Discussion, Education and Socialization  Additional Comments:    S: None noted*  O: OT tx with focus on self esteem building this date. Education given on definition of self esteem, with both causes of low and high self esteem identified. Activity given for pt to identify a positive/aspiring trait for each letter of the alphabet. Pt to work with peers to help complete activity and build positive thinking.   A: Pt was asleep for duration of group activity.  P: Continue to engage patient in OT group sessions 2x/week.   Donne Hazel, MOT, OTR/L  09/28/2019, 3:59 PM

## 2019-09-28 NOTE — Progress Notes (Signed)
   09/28/19 0619  Vital Signs  Temp 97.7 F (36.5 C)  Temp Source Oral  Pulse Rate 74  BP 125/88  BP Location Left Arm  BP Method Automatic  Patient Position (if appropriate) Sitting   D:  Patient took all available meds. Patient was isolative in his room.  Patient denies SI/HI but reported that he might of saw something or it maybe was a dream. Patient rated pain in left thigh where he has a contusion. Patient took scheduled medications for pain. Patient rated depression 7/10 and anxiety 8/10. A:   Support and encouragement provided Routine safety checks conducted every 15 minutes. Patient  Informed to notify staff with any concerns.  R:  Safety maintained.

## 2019-09-29 NOTE — B[facility] Group Notes (Signed)
B[facility] Group Notes: (Clinical Social Work)   09/29/2019      Type of Therapy:  Group Therapy   Participation Level:  Did Not Attend - was invited both individually by MHT and by overhead announcement, chose not to attend.   Ambrose Mantle, LCSW 09/29/2019, 2:55 PM

## 2019-09-29 NOTE — BHH Group Notes (Signed)
Adult Psychoeducational Group Note  Date:  09/29/2019 Time:  11:47 AM  Group Topic/Focus:  Goals Group:   The focus of this group is to help patients establish daily goals to achieve during treatment and discuss how the patient can incorporate goal setting into their daily lives to aide in recovery.  Participation Level:  Did Not Attend   Dione Housekeeper 09/29/2019, 11:47 AM
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
## 2019-09-29 NOTE — Progress Notes (Signed)
D. Pt presents as anxious, but has been calm and cooperative on the unit- Per pt's self inventory, pt rated his depression, hopelessness and anxiety an 8/8/9, respectively. Pt wrote that his goal today was "staying focused", and "trying much harder" to help him to meet that goal.  Pt currently denies SI/HI and AVH A. Labs and vitals monitored. Pt compliant with medications. Pt supported emotionally and encouraged to express concerns and ask questions.   R. Pt remains safe with 15 minute checks. Will continue POC.
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
## 2019-09-29 NOTE — Progress Notes (Signed)
   09/29/19 2354  Psych Admission Type (Psych Patients Only)  Admission Status Voluntary  Psychosocial Assessment  Patient Complaints Anxiety  Eye Contact Fair  Facial Expression Anxious;Sullen;Sad;Worried  Affect Anxious;Depressed  Speech Logical/coherent  Interaction Assertive  Motor Activity Slow;Unsteady  Appearance/Hygiene Unremarkable  Behavior Characteristics Cooperative  Mood Depressed  Thought Process  Coherency WDL  Content WDL  Delusions None reported or observed  Perception WDL  Hallucination Visual  Judgment Poor  Confusion None  Danger to Self  Current suicidal ideation? Denies  Danger to Others  Danger to Others None reported or observed

## 2019-09-30 MED ORDER — CITALOPRAM HYDROBROMIDE 20 MG PO TABS: 20.0000 mg | ORAL_TABLET | Freq: Every day | ORAL | 0 refills | Status: DC

## 2019-09-30 MED ORDER — APIXABAN 2.5 MG PO TABS: 2.5000 mg | ORAL_TABLET | Freq: Two times a day (BID) | ORAL | 0 refills | Status: DC

## 2019-09-30 MED ORDER — METFORMIN HCL 500 MG PO TABS: 500.0000 mg | ORAL_TABLET | Freq: Every day | ORAL | 0 refills | Status: DC

## 2019-09-30 MED ORDER — WHITE PETROLATUM EX OINT
TOPICAL_OINTMENT | CUTANEOUS | Status: AC
  Administered 2019-09-30: 1
  Filled 2019-09-30: qty 5

## 2019-09-30 MED ORDER — GABAPENTIN 300 MG PO CAPS: 300.0000 mg | ORAL_CAPSULE | Freq: Three times a day (TID) | ORAL | 0 refills | Status: DC

## 2019-09-30 MED ORDER — DICLOFENAC SODIUM 1 % EX GEL: 2.0000 g | Freq: Three times a day (TID) | CUTANEOUS | 0 refills | Status: DC | PRN

## 2019-09-30 MED ORDER — HYDROXYZINE HCL 25 MG PO TABS: 25.0000 mg | ORAL_TABLET | Freq: Three times a day (TID) | ORAL | 0 refills | Status: DC | PRN

## 2019-09-30 MED ORDER — OLANZAPINE 7.5 MG PO TABS: 7.5000 mg | ORAL_TABLET | Freq: Every day | ORAL | 0 refills | Status: DC

## 2019-09-30 MED ORDER — PRAZOSIN HCL 1 MG PO CAPS: 1.0000 mg | ORAL_CAPSULE | Freq: Every day | ORAL | 0 refills | Status: DC

## 2019-09-30 NOTE — Progress Notes (Signed)
Adult Psychoeducational Group Note  Date:  09/30/2019 Time:  11:51 PM  Group Topic/Focus:  Wrap-Up Group:   The focus of this group is to help patients review their daily goal of treatment and discuss progress on daily workbooks.  Participation Level:  Active  Participation Quality:  Attentive  Affect:  Appropriate  Cognitive:  Oriented  Insight: Appropriate  Engagement in Group:  Developing/Improving  Modes of Intervention:  Education and Support  Additional Comments:  Dale [name] he has improved a lot from yesterday. He is grateful for the love he received from staff and patient for his birthday.   Lynasia Meloche  [name] 09/30/2019, 11:51 PM

## 2019-09-30 NOTE — [facility] Group Notes (Signed)
B[facility] LCSW Group Therapy Note  Date/Time:  09/30/2019 9:00-10:00 or 10:00-11:00AM  Type of Therapy and Topic:  Group Therapy:  Healthy and Unhealthy Supports  Participation Level:  Minimal   Description of Group:  Patients in this group were introduced to the idea of adding a variety of healthy supports to address the various needs in their lives.Patients discussed what additional healthy supports could be helpful in their recovery and wellness after discharge in order to prevent future hospitalizations.   An emphasis was placed on using counselor, doctor, therapy groups, 12-step groups, and problem-specific support groups to expand supports.  Several songs were played to emphasize points made throughout group.  Therapeutic Goals:   1)  discuss importance of adding supports to stay well once out of the hospital  2)  compare healthy versus unhealthy supports and identify some examples of each  3)  generate ideas and descriptions of healthy supports that can be added  4)  offer mutual support about how to address unhealthy supports  5)  encourage active participation in and adherence to discharge plan    Summary of Patient Progress:  The patient expressed a willingness to add a friend he can trust as support(s) to help in his recovery journey.  He is also going to rehab tomorrow.   Therapeutic Modalities:   Motivational Interviewing Brief Solution-Focused Therapy  Ambrose Mantle, LCSW
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
## 2019-09-30 NOTE — Progress Notes (Signed)
   09/30/19 0900  Psych Admission Type (Psych Patients Only)  Admission Status Voluntary  Psychosocial Assessment  Patient Complaints Anxiety  Eye Contact Fair  Facial Expression Anxious;Sullen;Sad;Worried  Affect Anxious;Depressed  Speech Logical/coherent  Interaction Assertive  Motor Activity Slow;Unsteady  Appearance/Hygiene Unremarkable  Behavior Characteristics Cooperative  Mood Depressed;Anxious  Thought Process  Coherency WDL  Content WDL  Delusions None reported or observed  Perception WDL  Hallucination Visual  Judgment Poor  Confusion None  Danger to Self  Current suicidal ideation? Denies  Danger to Others  Danger to Others None reported or observed

## 2019-09-30 NOTE — [facility] Suicide Risk Assessment (Signed)
[facility] Discharge Suicide Risk Assessment   Principal Problem: Severe major depression with psychotic features, mood-congruent (HCC) Discharge Diagnoses: Principal Problem:   Severe major depression with psychotic features, mood-congruent (HCC) Active Problems:   Bipolar 1 disorder (HCC)   Total Time spent with patient: 15 minutes  Musculoskeletal: Strength & Muscle Tone: within normal limits Gait & Station: normal Patient leans: N/A  Psychiatric Specialty Exam: Review of Systems  All other systems reviewed and are negative.   Blood pressure 139/80, pulse 80, temperature 97.7 F (36.5 C), temperature source Oral, resp. rate 18, height 5\' 11"  (1.803 m), weight 80.7 kg, SpO2 99 %.Body mass index is 24.81 kg/m.  General Appearance: Casual  Eye Contact::  Fair  Speech:  Normal Rate409  Volume:  Normal  Mood:  Depressed  Affect:  Congruent  Thought Process:  Coherent and Descriptions of Associations: Intact  Orientation:  Full (Time, Place, and Person)  Thought Content:  Logical  Suicidal Thoughts:  No  Homicidal Thoughts:  No  Memory:  Immediate;   Fair Recent;   Fair Remote;   Fair  Judgement:  Intact  Insight:  Fair  Psychomotor Activity:  Normal  Concentration:  Good  Recall:  Good  Fund of Knowledge:Good  Language: Good  Akathisia:  Negative  Handed:  Right  AIMS (if indicated):     Assets:  Desire for Improvement Resilience  Sleep:  Number of Hours: 5.75  Cognition: WNL  ADL's:  Intact   Mental Status Per Nursing Assessment::   On Admission:  Suicidal ideation indicated by patient, Suicidal ideation indicated by others, Suicide plan, Self-harm thoughts, Thoughts of violence towards others  Demographic Factors:  Male, Divorced or widowed, Low socioeconomic status, Living alone and Unemployed  Loss Factors: NA  Historical Factors: Impulsivity  Risk Reduction Factors:   Positive coping skills or problem solving skills  Continued Clinical Symptoms:   Depression:   Comorbid alcohol abuse/dependence Impulsivity Alcohol/Substance Abuse/Dependencies  Cognitive Features That Contribute To Risk:  None    Suicide Risk:  Minimal: No identifiable suicidal ideation.  Patients presenting with no risk factors but with morbid ruminations; may be classified as minimal risk based on the severity of the depressive symptoms   Follow-up Information    [facility] Follow up on 10/04/2019.   Why: You have an appointment for medication management and therapy services on 10/04/19 at 9:30 am.  This will be a Virtual appointment.  Contact information: [address], [address] [place], [place]  Phone: [phone] or [phone] F: [phone]       Services, [facility] Follow up.   Why: A referral has been made to this provider on your behalf.  Contact information: [phone] [place] [phone]               Plan Of Care/Follow-up recommendations:  Activity:  ad lib  [phone], MD 09/30/2019, 3:11 PM
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
## 2019-10-01 NOTE — Progress Notes (Signed)
Discharge Note:  D. Pt alert and oriented x 3. Denies SI and HI at present.  Compliant with medications.. Discharged to [facility] per Provider orders.     A.  Emotional Support offered substance abuse resources provided to Patient and encouragement provided. All belongings from locker 38 returned to Patient at the time of discharge. Discharge instructions reveiwed with Patient including Substance abuse Centers.  R.  Pt verbalized understanding related to  discharge instructions. Pt signed belonging sheet in agreement with items received from locker 38. Denies concerns at this time ambulatory with steady gait. Appears to be in no physical distress in time of discharge. Picked up from the lobby by Benedetto Goad.

## 2019-10-01 NOTE — Progress Notes (Signed)
   09/30/19 2100  Psych Admission Type (Psych Patients Only)  Admission Status Voluntary  Psychosocial Assessment  Patient Complaints Anxiety  Eye Contact Fair  Facial Expression Anxious  Affect Anxious;Depressed  Speech Logical/coherent  Interaction Assertive  Motor Activity Slow;Unsteady  Appearance/Hygiene Unremarkable  Behavior Characteristics Cooperative  Mood Depressed;Anxious  Thought Process  Coherency WDL  Content WDL  Delusions None reported or observed  Perception WDL  Hallucination None reported or observed  Judgment Poor  Confusion None  Danger to Self  Current suicidal ideation? Denies  Danger to Others  Danger to Others None reported or observed  Prn Vistaril given at hs and effective for sleep and anxiety. Patient looking forward to discharge this morning at 7am to [facility].

## 2019-10-01 NOTE — Progress Notes (Signed)
  [facility] Adult Case Management Discharge Plan :  Will you be returning to the same living situation after discharge:  No. Has a residential substance use treatment screening at [facility] At discharge, do you have transportation home?: Yes,  Safe Transportation Do you have the ability to pay for your medications: Yes,  provided samples  Release of information consent forms completed and in the chart;  Patient's signature needed at discharge.  Patient to Follow up at:  Follow-up Information    [facility] Follow up on 10/04/2019.   Why: You have an appointment for medication management and therapy services on 10/04/19 at 9:30 am.  This will be a Virtual appointment.  Contact information: [address], [address] [place], [place] [address]  Phone: [phone] or [phone] F: [phone]       Services, [facility] Follow up.   Why: A referral has been made to this provider on your behalf.  Contact information: [address] [place] [address] [phone]               Next level of care provider has access to [facility] Link:no  Safety Planning and Suicide Prevention discussed: Yes,  with patient. Declined consents  Have you used any form of tobacco in the last 30 days? (Cigarettes, Smokeless Tobacco, Cigars, and/or Pipes): Yes  Has patient been referred to the Quitline?: Patient refused referral  Patient has been referred for addiction treatment: Yes  Darreld Mclean, LCSWA 10/01/2019, 8:34 AM

## 2019-10-01 NOTE — Plan of Care (Signed)
Care Plan resolved Patient Discharging to [facility].

## 2019-10-25 ENCOUNTER — Ambulatory Visit (INDEPENDENT_AMBULATORY_CARE_PROVIDER_SITE_OTHER): Payer: Self-pay | Admitting: Primary Care

## 2019-10-25 ENCOUNTER — Encounter (INDEPENDENT_AMBULATORY_CARE_PROVIDER_SITE_OTHER): Payer: Self-pay | Admitting: Primary Care

## 2019-10-25 ENCOUNTER — Other Ambulatory Visit: Payer: Self-pay

## 2019-10-25 VITALS — BP 148/62 | HR 77 | Temp 98.0°F | Ht 70.11 in | Wt 201.0 lb

## 2019-10-25 DIAGNOSIS — G4700 Insomnia, unspecified: Secondary | ICD-10-CM

## 2019-10-25 DIAGNOSIS — F333 Major depressive disorder, recurrent, severe with psychotic symptoms: Secondary | ICD-10-CM

## 2019-10-25 DIAGNOSIS — I8290 Acute embolism and thrombosis of unspecified vein: Secondary | ICD-10-CM

## 2019-10-25 DIAGNOSIS — E119 Type 2 diabetes mellitus without complications: Secondary | ICD-10-CM

## 2019-10-25 DIAGNOSIS — E2601 Conn's syndrome: Secondary | ICD-10-CM

## 2019-10-25 DIAGNOSIS — Z09 Encounter for follow-up examination after completed treatment for conditions other than malignant neoplasm: Secondary | ICD-10-CM

## 2019-10-25 DIAGNOSIS — Z760 Encounter for issue of repeat prescription: Secondary | ICD-10-CM

## 2019-10-25 DIAGNOSIS — I10 Essential (primary) hypertension: Secondary | ICD-10-CM

## 2019-10-25 DIAGNOSIS — K219 Gastro-esophageal reflux disease without esophagitis: Secondary | ICD-10-CM

## 2019-10-25 MED ORDER — DICYCLOMINE HCL 20 MG PO TABS: 20.0000 mg | ORAL_TABLET | Freq: Four times a day (QID) | ORAL | 0 refills | Status: AC

## 2019-10-25 MED ORDER — OMEPRAZOLE 20 MG PO CPDR: 20.0000 mg | DELAYED_RELEASE_CAPSULE | Freq: Every day | ORAL | 0 refills | Status: AC

## 2019-10-25 MED ORDER — PRAZOSIN HCL 1 MG PO CAPS: 1.0000 mg | ORAL_CAPSULE | Freq: Every day | ORAL | 0 refills | Status: AC

## 2019-10-25 MED ORDER — CITALOPRAM HYDROBROMIDE 20 MG PO TABS: 20.0000 mg | ORAL_TABLET | Freq: Every day | ORAL | 0 refills | Status: AC

## 2019-10-25 MED ORDER — METFORMIN HCL 500 MG PO TABS: 500.0000 mg | ORAL_TABLET | Freq: Every day | ORAL | 0 refills | Status: AC

## 2019-10-25 MED ORDER — DICLOFENAC SODIUM 1 % EX GEL: 2.0000 g | Freq: Three times a day (TID) | CUTANEOUS | 0 refills | Status: AC | PRN

## 2019-10-25 MED ORDER — HYDROXYZINE HCL 25 MG PO TABS: 25.0000 mg | ORAL_TABLET | Freq: Three times a day (TID) | ORAL | 0 refills | Status: AC | PRN

## 2019-10-25 MED ORDER — APIXABAN 2.5 MG PO TABS: 2.5000 mg | ORAL_TABLET | Freq: Two times a day (BID) | ORAL | 0 refills | Status: DC

## 2019-10-25 MED ORDER — OLANZAPINE 7.5 MG PO TABS: 7.5000 mg | ORAL_TABLET | Freq: Every day | ORAL | 0 refills | Status: AC

## 2019-10-25 MED ORDER — GABAPENTIN 300 MG PO CAPS: 300.0000 mg | ORAL_CAPSULE | Freq: Three times a day (TID) | ORAL | 0 refills | Status: AC

## 2019-10-25 MED FILL — DICYCLOMINE 20 MG TABLET: 20 | 15 days supply | Qty: 60 | Fill #0

## 2019-10-25 MED FILL — OMEPRAZOLE 20 MG CAP: 20 | 30 days supply | Qty: 30 | Fill #0
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
## 2019-10-25 NOTE — Patient Instructions (Signed)
Crohn's Disease  Crohn's disease is a long-lasting (chronic) disease that affects the gastrointestinal (GI) tract. Crohn's disease often causes irritation and inflammation in the small intestine and the beginning of the large intestine, but it can affect any part of the GI tract. Crohn's disease is part of a group of illnesses that are known as inflammatory bowel disease (IBD). Crohn's disease may start slowly and get worse over time. Symptoms may come and go. They may also go away for months or even years at a time (remission). What are the causes? The exact cause of this condition is not known. It may involve a response that causes your body's disease-fighting (immune) system to attack healthy cells and tissues (autoimmune response). Bacteria, genes, and your environment may also play a role. What increases the risk? The following factors may make you more likely to develop this condition:  Having a family member who has Crohn's disease, another IBD, or an autoimmune condition.  Using products that contain nicotine or tobacco, such as cigarettes and e-cigarettes.  Being in your 20s.  Having Eastern European ancestry. What are the signs or symptoms? The main symptoms of this condition involve your GI tract. These include:  Diarrhea.  Pain or cramping in the abdomen. This is commonly felt in the lower right side of the abdomen.  Frequent watery or bloody stools.  Constipation. This may mean having: ? Fewer bowel movements in a week than normal. ? Difficulty having a bowel movement. ? Stools that are dry, hard, or larger than normal.  Rectal bleeding.  Rectal pain.  An urgent need to have a bowel movement.  The feeling that you are not finished having a bowel movement. Other symptoms may include:  Unexplained weight loss.  Fatigue.  Fever.  Nausea.  Loss of appetite.  Joint pain.  Vision changes.  Red bumps or sores on the skin.  Sores inside the mouth. How is  this diagnosed? This condition may be diagnosed based on:  Your symptoms and your medical history.  A physical exam.  Tests, which may include: ? Blood tests. ? Stool sample tests. ? Imaging tests, such as X-rays and CT scans. ? Tests to examine the inside of your intestines using a long, flexible tube that has a light and a camera on the end (endoscopy or colonoscopy). ? A procedure to remove tissue samples from inside your bowel for testing (biopsy). You may need to work with a health care provider who specializes in diseases of the digestive tract (gastroenterologist). How is this treated? There is no cure for this condition, but treatment can help you manage your symptoms. Crohn's disease affects each person differently. Your treatment may include:  Resting your bowels. This involves having a period of healing time when your bowels are not passing stools. This may be done by: ? Drinking only clear liquids. These are liquids that you can see through, such as water, black coffee, fruit juice without pulp, broth, gelatin, and ice pops. ? Getting nutrition through an IV for a period of time.  Medicines. These may be used by themselves or with other treatments (combination therapy). These may include antibiotic medicines. You may be given medicines that help to: ? Reduce inflammation. ? Control your immune system activity. ? Fight infections. ? Relieve cramps and prevent diarrhea. ? Control your pain.  Surgery. You may need surgery if: ? Medicines and other treatments are not working anymore. ? You develop complications from severe Crohn's disease. ? A section of your   intestine becomes so damaged that it needs to be removed. Follow these instructions at home: Medicines  Take over-the-counter and prescription medicines only as told by your health care provider.  If you were prescribed an antibiotic, take it as told by your health care provider. Do not stop taking the antibiotic  even if you start to feel better.  Avoid taking ibuprofen or other NSAID medicines if possible, these can make Crohn's disease worse. Eating and drinking  Talk with your health care provider or a diet and nutrition specialist (registered dietitian) about what diet is best for you.  Drink enough fluid to keep your urine pale yellow.  If you are taking steroids to reduce inflammation, get plenty of calcium in your diet to help keep your bones healthy. You may also consider taking a calcium supplement with vitamin D.  Keep a food diary to identify foods that make your symptoms better or worse.  Avoid foods that cause symptoms.  Follow instructions from your health care provider about eating or drinking restrictions if you have worsening symptoms (flare-up).  Limit alcohol intake to no more than 1 drink a day for nonpregnant women and 2 drinks a day for men. One drink equals 12 oz of beer, 5 oz of wine, or 1 oz of hard liquor. General instructions  Make sure you get all the vaccines that your health care provider recommends, especially pneumonia (pneumococcal) and flu (influenza) vaccines.  Do not use any products that contain nicotine or tobacco, such as cigarettes and e-cigarettes. If you need help quitting, ask your health care provider.  Exercise every day, or as often told by your health care provider.  Keep all follow-up visits as told by your health care provider. This is important. Contact a health care provider if:  You have diarrhea, cramps in your abdomen, and other GI problems that are present almost all the time.  Your symptoms do not improve with treatment.  You continue to lose weight.  You develop a rash or sores on your skin.  You develop eye problems.  You have a fever.  Your symptoms get worse.  You develop new symptoms. Get help right away if:  You have bloody diarrhea.  You have severe pain in your abdomen.  You cannot pass  stools. Summary  Crohn's disease affects each person differently. There are multiple treatment options that can help you manage the condition.  Talk with your health care provider or diet and nutrition specialist (registered dietitian) about what diet is best for you.  Make sure you get all the vaccines that your health care provider recommends, especially pneumonia (pneumococcal) and flu (influenza) vaccines. This information is not intended to replace advice given to you by your health care provider. Make sure you discuss any questions you have with your health care provider. Document Revised: 03/04/2017 Document Reviewed: 11/22/2016 Elsevier Patient Education  2020 [employer] Inc.  

## 2019-10-26 ENCOUNTER — Other Ambulatory Visit (INDEPENDENT_AMBULATORY_CARE_PROVIDER_SITE_OTHER): Payer: Self-pay | Admitting: Primary Care

## 2019-10-26 DIAGNOSIS — I8290 Acute embolism and thrombosis of unspecified vein: Secondary | ICD-10-CM

## 2019-10-26 MED ORDER — APIXABAN 2.5 MG PO TABS: 2.5000 mg | ORAL_TABLET | Freq: Two times a day (BID) | ORAL | 0 refills | Status: AC

## 2019-10-26 MED FILL — ELIQUIS 2.5 MG TABLET: 2.5 | 30 days supply | Qty: 60 | Fill #0

## 2019-10-26 NOTE — Telephone Encounter (Signed)
The eliquis was printed and not sent e-scribe to [facility]. Please send

## 2019-12-02 ENCOUNTER — Encounter (HOSPITAL_COMMUNITY): Payer: Self-pay | Admitting: Emergency Medicine

## 2019-12-02 ENCOUNTER — Ambulatory Visit (HOSPITAL_COMMUNITY)
Admission: EM | Admit: 2019-12-02 | Discharge: 2019-12-02 | Disposition: A | Discharge status: Home / Self Care | Payer: No Payment, Other | Attending: Behavioral Health | Admitting: Behavioral Health

## 2019-12-02 ENCOUNTER — Other Ambulatory Visit: Payer: Self-pay

## 2019-12-02 DIAGNOSIS — F3162 Bipolar disorder, current episode mixed, moderate: Secondary | ICD-10-CM

## 2019-12-02 DIAGNOSIS — F4310 Post-traumatic stress disorder, unspecified: Secondary | ICD-10-CM | POA: Insufficient documentation

## 2019-12-02 DIAGNOSIS — Z560 Unemployment, unspecified: Secondary | ICD-10-CM | POA: Insufficient documentation

## 2019-12-02 DIAGNOSIS — F411 Generalized anxiety disorder: Secondary | ICD-10-CM | POA: Diagnosis not present

## 2019-12-02 DIAGNOSIS — F319 Bipolar disorder, unspecified: Secondary | ICD-10-CM | POA: Insufficient documentation

## 2019-12-02 DIAGNOSIS — Z20822 Contact with and (suspected) exposure to covid-19: Secondary | ICD-10-CM | POA: Diagnosis not present

## 2019-12-02 DIAGNOSIS — K5090 Crohn's disease, unspecified, without complications: Secondary | ICD-10-CM | POA: Insufficient documentation

## 2019-12-02 DIAGNOSIS — Z87891 Personal history of nicotine dependence: Secondary | ICD-10-CM | POA: Insufficient documentation

## 2019-12-02 DIAGNOSIS — E119 Type 2 diabetes mellitus without complications: Secondary | ICD-10-CM | POA: Insufficient documentation

## 2019-12-02 DIAGNOSIS — I10 Essential (primary) hypertension: Secondary | ICD-10-CM | POA: Insufficient documentation

## 2019-12-02 LAB — COMPREHENSIVE METABOLIC PANEL
ALT: 19 U/L (ref 0–44)
AST: 25 U/L (ref 15–41)
Albumin: 3.5 g/dL (ref 3.5–5.0)
Alkaline Phosphatase: 68 U/L (ref 38–126)
Anion gap: 8 (ref 5–15)
BUN: 11 mg/dL (ref 6–20)
CO2: 24 mmol/L (ref 22–32)
Calcium: 8.7 mg/dL — ABNORMAL LOW (ref 8.9–10.3)
Chloride: 108 mmol/L (ref 98–111)
Creatinine, Ser: 1.27 mg/dL — ABNORMAL HIGH (ref 0.61–1.24)
GFR calc Af Amer: >60 mL/min (ref >60)
GFR calc non Af Amer: >60 mL/min (ref >60)
Glucose, Bld: 78 mg/dL (ref 70–99)
Potassium: 4 mmol/L (ref 3.5–5.1)
Sodium: 140 mmol/L (ref 135–145)
Total Bilirubin: 0.4 mg/dL (ref 0.3–1.2)
Total Protein: 6.4 g/dL — ABNORMAL LOW (ref 6.5–8.1)

## 2019-12-02 LAB — POCT URINE DRUG SCREEN - MANUAL ENTRY (I-SCREEN)
POC Amphetamine UR: NOT DETECTED
POC Buprenorphine (BUP): NOT DETECTED
POC Cocaine UR: POSITIVE — AB
POC Marijuana UR: NOT DETECTED
POC Methadone UR: NOT DETECTED
POC Methamphetamine UR: NOT DETECTED
POC Morphine: NOT DETECTED
POC Oxazepam (BZO): NOT DETECTED
POC Oxycodone UR: NOT DETECTED
POC Secobarbital (BAR): NOT DETECTED

## 2019-12-02 LAB — CBC WITH DIFFERENTIAL/PLATELET
Abs Immature Granulocytes: 0.01 10*3/uL (ref 0.00–0.07)
Basophils Absolute: 0 10*3/uL (ref 0.0–0.1)
Basophils Relative: 1 %
Eosinophils Absolute: 0.2 10*3/uL (ref 0.0–0.5)
Eosinophils Relative: 5 %
HCT: 42.3 % (ref 39.0–52.0)
Hemoglobin: 13.3 g/dL (ref 13.0–17.0)
Immature Granulocytes: 0 %
Lymphocytes Relative: 40 %
Lymphs Abs: 1.7 10*3/uL (ref 0.7–4.0)
MCH: 28.7 pg (ref 26.0–34.0)
MCHC: 31.4 g/dL (ref 30.0–36.0)
MCV: 91.2 fL (ref 80.0–100.0)
Monocytes Absolute: 0.3 10*3/uL (ref 0.1–1.0)
Monocytes Relative: 7 %
Neutro Abs: 2 10*3/uL (ref 1.7–7.7)
Neutrophils Relative %: 47 %
Platelets: 253 10*3/uL (ref 150–400)
RBC: 4.64 MIL/uL (ref 4.22–5.81)
RDW: 13.1 % (ref 11.5–15.5)
WBC: 4.2 10*3/uL (ref 4.0–10.5)
nRBC: 0 % (ref 0.0–0.2)

## 2019-12-02 LAB — POC SARS CORONAVIRUS 2 AG: SARS Coronavirus 2 Ag: NEGATIVE

## 2019-12-02 LAB — POC SARS CORONAVIRUS 2 AG -  ED: SARS Coronavirus 2 Ag: NEGATIVE

## 2019-12-02 LAB — MAGNESIUM: Magnesium: 2.3 mg/dL (ref 1.7–2.4)

## 2019-12-02 LAB — LIPID PANEL
Cholesterol: 108 mg/dL (ref 0–200)
HDL: 47 mg/dL (ref >40)
LDL Cholesterol: 50 mg/dL (ref 0–99)
Total CHOL/HDL Ratio: 2.3 RATIO
Triglycerides: 53 mg/dL (ref <150)
VLDL: 11 mg/dL (ref 0–40)

## 2019-12-02 LAB — HEMOGLOBIN A1C
Hgb A1c MFr Bld: 6 % — ABNORMAL HIGH (ref 4.8–5.6)
Mean Plasma Glucose: 125.5 mg/dL

## 2019-12-02 LAB — TSH: TSH: 1.087 u[IU]/mL (ref 0.350–4.500)

## 2019-12-02 LAB — SARS CORONAVIRUS 2 BY RT PCR (HOSPITAL ORDER, PERFORMED IN ~~LOC~~ HOSPITAL LAB): SARS Coronavirus 2: NEGATIVE

## 2019-12-02 LAB — ETHANOL: Alcohol, Ethyl (B): <10 mg/dL (ref <10)

## 2019-12-02 MED ORDER — CLONIDINE HCL 0.1 MG PO TABS: 0.1000 mg | ORAL_TABLET | Freq: Every day | ORAL | Status: DC

## 2019-12-02 MED ORDER — HYDROXYZINE HCL 25 MG PO TABS
25.0000 mg | ORAL_TABLET | Freq: Four times a day (QID) | ORAL | Status: DC | PRN
  Administered 2019-12-02: 25 mg via ORAL
  Filled 2019-12-02: qty 1

## 2019-12-02 MED ORDER — CLONIDINE HCL 0.1 MG PO TABS
0.1000 mg | ORAL_TABLET | Freq: Four times a day (QID) | ORAL | Status: DC
  Administered 2019-12-02: 0.1 mg via ORAL
  Filled 2019-12-02: qty 1

## 2019-12-02 MED ORDER — LOPERAMIDE HCL 2 MG PO CAPS: 2.0000 mg | ORAL_CAPSULE | ORAL | Status: DC | PRN

## 2019-12-02 MED ORDER — NAPROXEN 500 MG PO TABS: 500.0000 mg | ORAL_TABLET | Freq: Two times a day (BID) | ORAL | Status: DC | PRN

## 2019-12-02 MED ORDER — ONDANSETRON 4 MG PO TBDP: 4.0000 mg | ORAL_TABLET | Freq: Four times a day (QID) | ORAL | Status: DC | PRN

## 2019-12-02 MED ORDER — CLONIDINE HCL 0.1 MG PO TABS: 0.1000 mg | ORAL_TABLET | ORAL | Status: DC

## 2019-12-02 MED ORDER — ACETAMINOPHEN 325 MG PO TABS
650.0000 mg | ORAL_TABLET | Freq: Four times a day (QID) | ORAL | Status: DC | PRN
  Administered 2019-12-02: 650 mg via ORAL
  Filled 2019-12-02: qty 2

## 2019-12-02 MED ORDER — MAGNESIUM HYDROXIDE 400 MG/5ML PO SUSP: 30.0000 mL | Freq: Every day | ORAL | Status: DC | PRN

## 2019-12-02 MED ORDER — METHOCARBAMOL 500 MG PO TABS
500.0000 mg | ORAL_TABLET | Freq: Three times a day (TID) | ORAL | Status: DC | PRN
  Administered 2019-12-02: 500 mg via ORAL
  Filled 2019-12-02: qty 1

## 2019-12-02 MED ORDER — ALUM & MAG HYDROXIDE-SIMETH 200-200-20 MG/5ML PO SUSP: 30.0000 mL | ORAL | Status: DC | PRN

## 2019-12-02 MED ORDER — TRAZODONE HCL 50 MG PO TABS: 50.0000 mg | ORAL_TABLET | Freq: Every evening | ORAL | Status: DC | PRN

## 2019-12-02 MED ORDER — DICYCLOMINE HCL 20 MG PO TABS
20.0000 mg | ORAL_TABLET | Freq: Four times a day (QID) | ORAL | Status: DC | PRN
  Administered 2019-12-02: 20 mg via ORAL
  Filled 2019-12-02: qty 1

## 2019-12-02 NOTE — ED Notes (Signed)
Pt moaning and fidgety.

## 2019-12-02 NOTE — ED Notes (Signed)
Pt provided with resources from [facility] which includes shelters, food pantries, and medical assistance resources.
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
## 2019-12-02 NOTE — ED Notes (Signed)
[name]'s belongings are stored in locker number 13.
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
## 2019-12-02 NOTE — ED Triage Notes (Signed)
Presents with suicidal ideation and cocaine abuse.

## 2019-12-02 NOTE — ED Notes (Signed)
Pt given breakfast: Cereal, muffin, and apple Juice.

## 2019-12-02 NOTE — Discharge Instructions (Addendum)
Take all of your medications as prescribed by your mental healthcare provider.  Report any adverse effects and reactions from your medications to your outpatient provider promptly.  Do not engage in alcohol and or illegal drug use while on prescription medicines. Keep all scheduled appointments. This is to ensure that you are getting refills on time and to avoid any interruption in your medication.  If you are unable to keep an appointment call to reschedule.  Be sure to follow up with resources and follow ups given. In the event of worsening symptoms call the crisis hotline, 911, and or go to the nearest emergency department for appropriate evaluation and treatment of symptoms. Follow-up with your primary care provider for your medical issues, concerns and or health care needs.   Please follow-up with provided handout titled "[facility] homelessness continuum of care"

## 2019-12-02 NOTE — ED Notes (Signed)
Pt asleep with even and unlabored respirations. No distress or discomfort noted. Pt remains safe on the unit. Will continue to monitor. 

## 2019-12-02 NOTE — ED Notes (Signed)
Pt alert and oriented on the unit. Education, support, and encouragement provided. Discharge summary, medications and follow up appointments reviewed with pt. Pt's belongings in locker returned. Pt denies SI/HI, A/VH, pain, or any concerns at this time. Pt ambulatory on and off unit. Pt discharged to lobby.

## 2019-12-02 NOTE — ED Notes (Signed)
Pt blood pressure and pulse is high. NP Adela Lank notified.

## 2019-12-02 NOTE — ED Notes (Signed)
Pt belongings returned. Pt mumbling about not being able to stay, lack of hot meals, and not being able to take a hot meal with him. Advised as to reasons why and gave him bag with some saltines and graham crackers with peanut butter. Advised he could take sweats that were given to him on night shift with him also. Pt refused. Escorted pt out front to main lobby. Pt asked registration if he could eat his crackers in lobby before he left. They said yes. Stable at time of d/c

## 2019-12-02 NOTE — ED Notes (Signed)
Pt A&O x 4, moaning and fidgety, presents with SI, no specific plan noted and cocaine abuse.  Denies HI or AVH.  Skin search completed, monitoring for safety.
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
## 2019-12-03 LAB — PROLACTIN: Prolactin: 7.6 ng/mL (ref 4.0–15.2)

## 2020-11-27 IMAGING — CT CT ANGIO CHEST-ABD-PELV FOR DISSECTION W/ AND WO/W CM
2 of 7 series · 12 of 46 positions shown, 14 images · IV contrast (ISOVUE)
Comparison: CT chest, abdomen, and pelvis February 27, 2018

CLINICAL DATA: Chest and abdomen pain

EXAM:
CT ANGIOGRAPHY CHEST, ABDOMEN AND PELVIS
TECHNIQUE: Initially, axial CT images were obtained through the chest without
intravenous contrast material administration. Multidetector CT
imaging through the chest, abdomen and pelvis was performed using
the standard protocol during bolus administration of intravenous
contrast. Multiplanar reconstructed images and MIPs were obtained
and reviewed to evaluate the vascular anatomy.
CONTRAST:  100mL OMNIPAQUE IOHEXOL 350 MG/ML SOLN

[Series 5: axial arterial · axial · arterial · 0.74mm/px · z∈[+1098,+1686]mm · 9 of 226 slices shown, 11 images]
[im 15/226  soft-tissue]
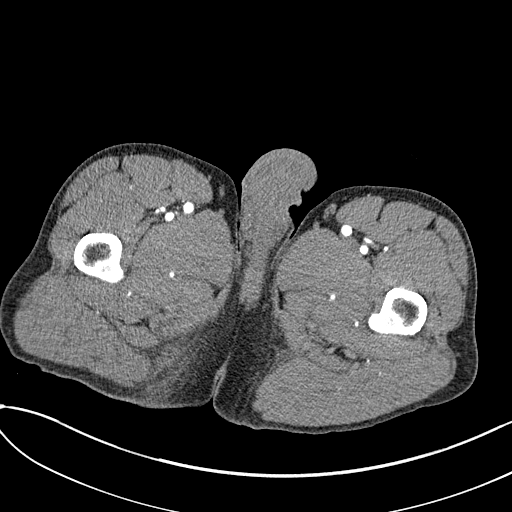
[im 15/226  bone]
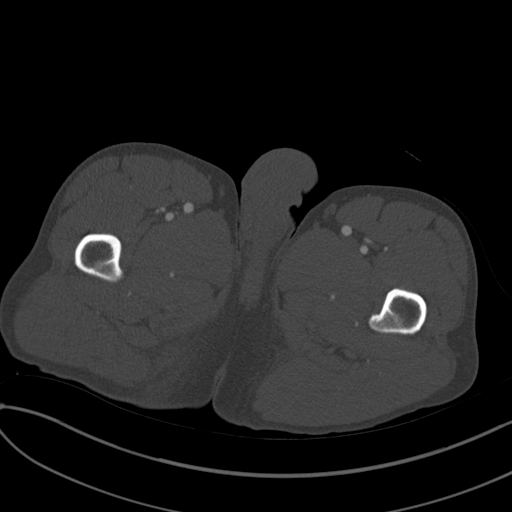
[im 43/226  soft-tissue]
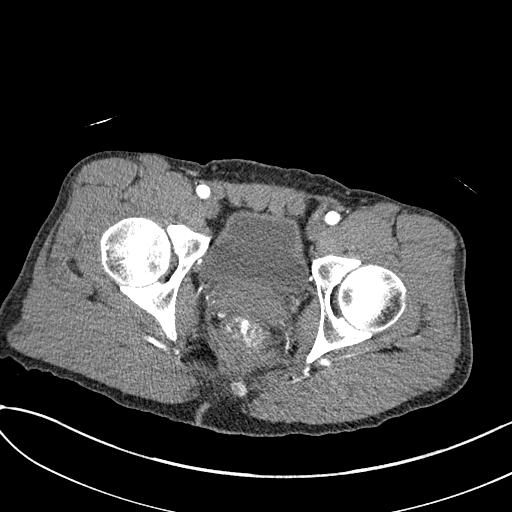
[im 71/226  soft-tissue]
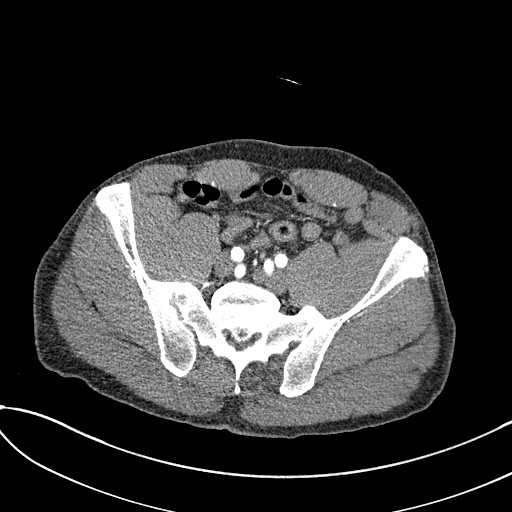
[im 85/226  soft-tissue]
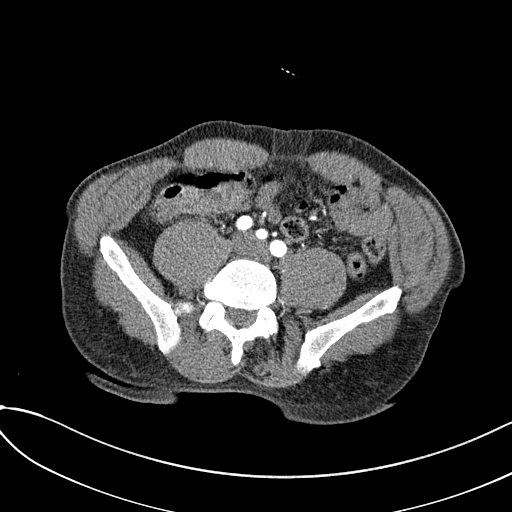
[im 113/226  soft-tissue]
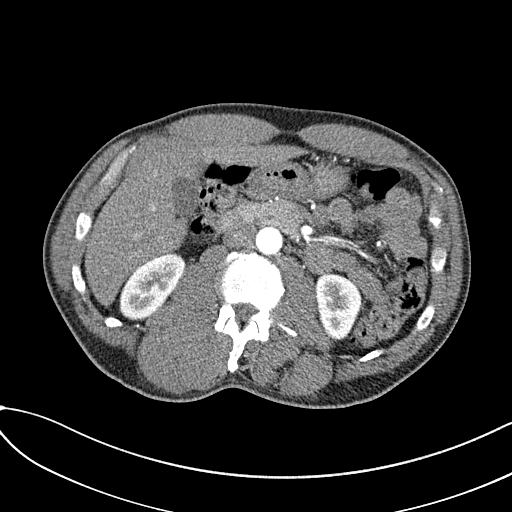
[im 141/226  soft-tissue]
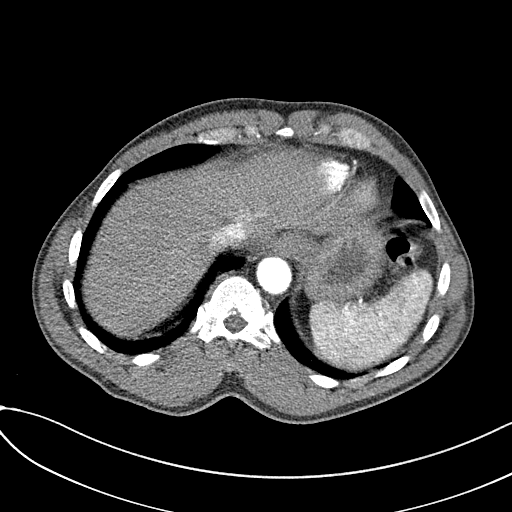
[im 155/226  soft-tissue]
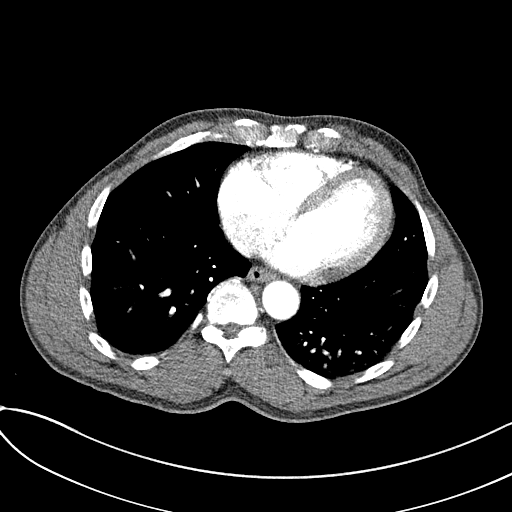
[im 183/226  soft-tissue]
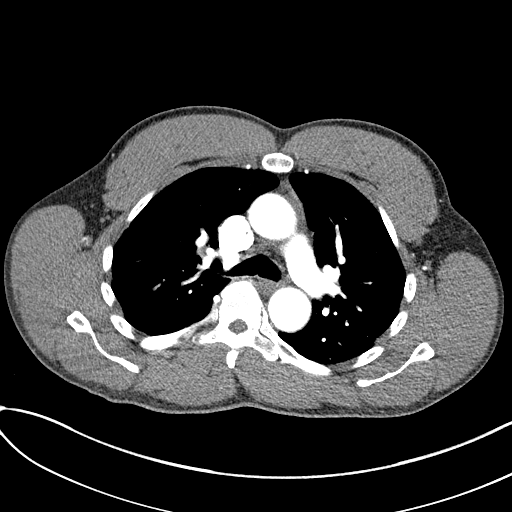
[im 211/226  soft-tissue]
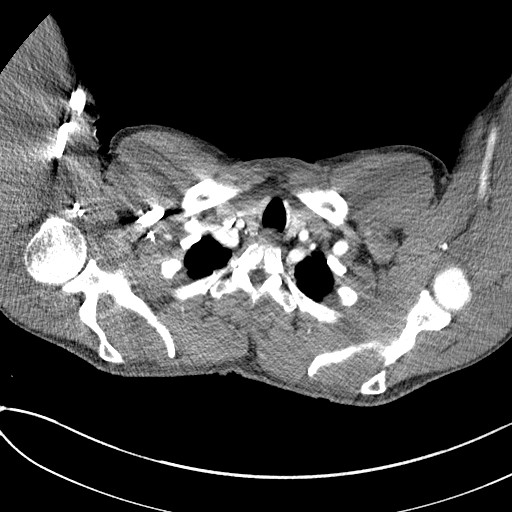
[im 211/226  bone]
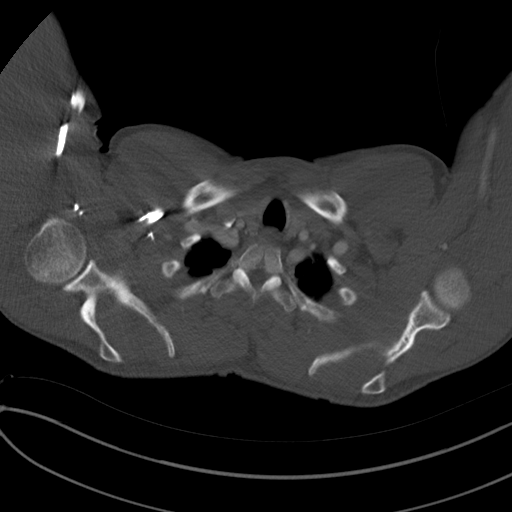

[Series 7: coronals · coronal · 0.74mm/px · 3 of 132 slices shown]
[im 33/132  soft-tissue]
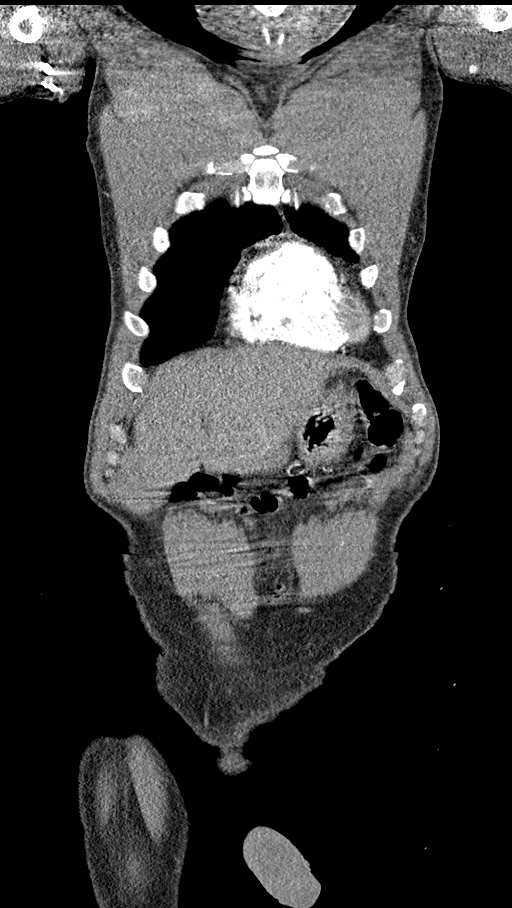
[im 66/132  soft-tissue]
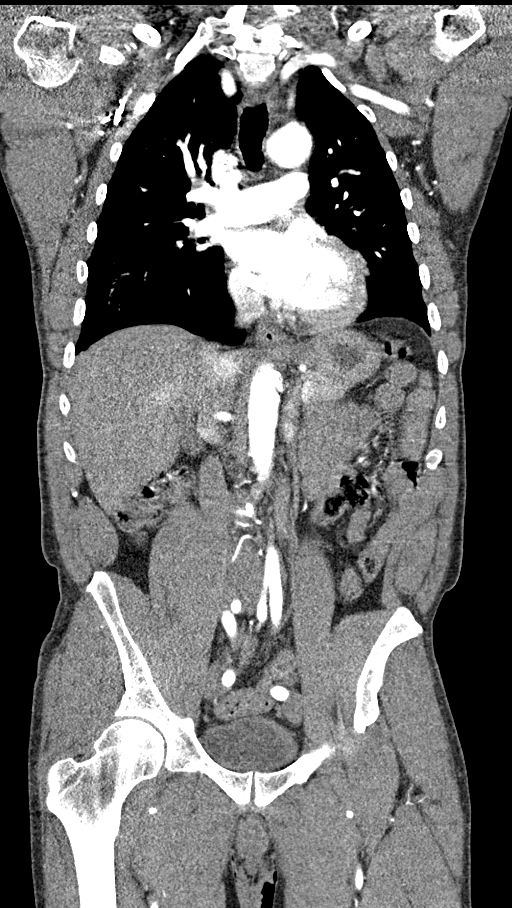
[im 99/132  soft-tissue]
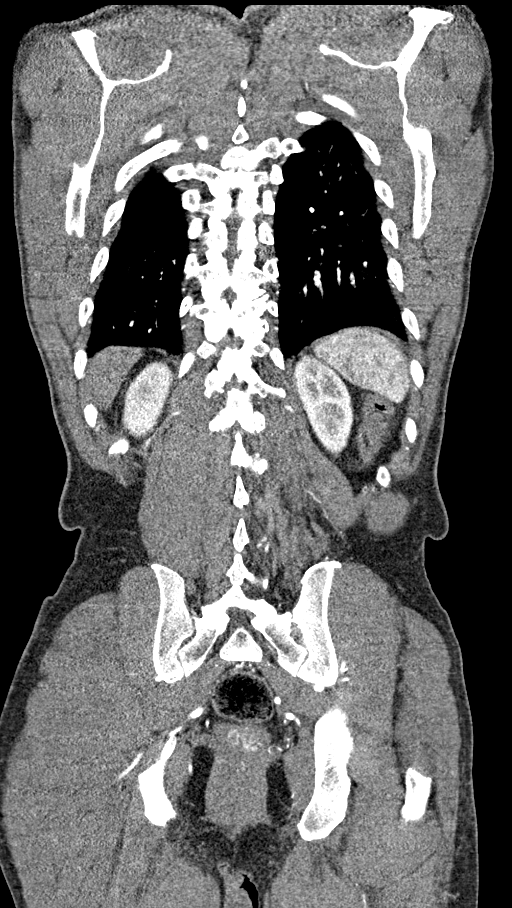

[12 of 46 positions shown; findings below may reference images not displayed]

FINDINGS: CTA CHEST FINDINGS

Cardiovascular: No intramural hematoma is evident on the noncontrast
enhanced study. There is no demonstrable thoracic aortic aneurysm or
dissection. Visualized great vessels appear unremarkable. Right
innominate and left common carotid arteries arise as a common trunk,
an anatomic variant. There is aortic atherosclerosis. There is no
pericardial effusion or pericardial thickening. There is no
demonstrable pulmonary embolus.

Mediastinum/Nodes: Thyroid appears unremarkable. There is no
appreciable thoracic adenopathy. No esophageal lesions are evident.

Lungs/Pleura: There is bullous disease in the apices with mild
scarring in the apices. There is a bulla in the medial right upper
lobe posteriorly measuring 3.7 x 2.6 cm. There is mild left base
atelectasis. There is no evident edema or consolidation. There is no
appreciable pleural effusion.

Musculoskeletal: There are no blastic or lytic bone lesions. There
is thoracic dextroscoliosis. No chest wall lesions are evident.

Review of the MIP images confirms the above findings.

CTA ABDOMEN AND PELVIS FINDINGS

VASCULAR

Aorta: There is no appreciable abdominal aortic aneurysm or
dissection. There is minimal atherosclerotic calcification in the
aorta.

Celiac: There is slight calcification at the origin of the celiac
artery. There is no hemodynamically significant obstruction in the
celiac artery or its major branches. No aneurysm or dissection is
present involving the celiac artery and its branches.

SMA: Minimal atherosclerotic plaque is noted at the origin of the
superior mesenteric artery. Superior mesenteric artery elsewhere and
its branches appear widely patent. No aneurysm or dissection
evident.

Renals: There is a single renal artery on each side. Renal artery
and its branches appear intact without appreciable obstructive
disease or plaque. No aneurysm or dissection involving these
vessels. No fibromuscular dysplasia.

IMA: Inferior mesenteric artery and its branches are widely patent.
No aneurysm or dissection involving these vessels.

Inflow: Pelvic arterial vessels appear patent with minimal plaque at
the origin of the right common carotid artery. No appreciable
obstruction in these vessels. No aneurysm or dissection involving
these vessels. The superficial femoral and profunda femoral arteries
proximally are widely patent as well without aneurysm or dissection.

Veins: No obvious venous abnormality within the limitations of this
arterial phase study.

Review of the MIP images confirms the above findings.

NON-VASCULAR

Hepatobiliary: There is hepatic steatosis. No focal liver lesions
are evident. Gallbladder wall is not appreciably thickened. There is
no biliary duct dilatation.

Pancreas: No pancreatic mass or inflammatory focus.

Spleen: No splenic lesions are evident.

Adrenals/Urinary Tract: Adrenals bilaterally appear normal. Kidneys
bilaterally show no evident mass or hydronephrosis on either side.
There is no evident renal or ureteral calculus on either side.
Urinary bladder is midline with wall thickness within normal limits.

Stomach/Bowel: There is no appreciable bowel wall or mesenteric
thickening. There is no evident bowel obstruction. Terminal ileum
appears unremarkable. There is no evident free air or portal venous
air.

Lymphatic: No evident adenopathy in the abdomen or pelvis.

Reproductive: Prostate and seminal vesicles are normal in size and
contour. No evident pelvic mass.

Other: Appendix appears unremarkable. No evident abscess or ascites
in the abdomen or pelvis. There is a small ventral hernia containing
only fat.

Musculoskeletal: There are foci of degenerative change in the lumbar
spine. There are no blastic or lytic bone lesions. No intramuscular
lesions are evident.

Review of the MIP images confirms the above findings.
IMPRESSION: CT angiogram chest:

1. No thoracic aortic aneurysm or dissection. There is aortic
atherosclerosis.
2. No demonstrable pulmonary embolus.
3. Upper lobe bullous disease. No edema or consolidation. Mild
atelectatic change.
4. No evident thoracic adenopathy.

CT angiogram abdomen; CT angiogram pelvis:

1. Minimal aortic and right common iliac artery atherosclerosis. No
hemodynamically significant obstruction involving the aorta, major
pelvic, and major mesenteric arterial vessels. No aneurysm or
dissection involving these vessels. No fibromuscular dysplasia.
2. No evident bowel obstruction. No abscess in the abdomen or
pelvis. Appendix appears normal.
3. No renal or ureteral calculus. No hydronephrosis. Urinary bladder
wall thickness normal.
4. Hepatic steatosis.
5. Small ventral hernia containing only fat.

## 2020-11-27 IMAGING — US ULTRASOUND SCROTUM DOPPLER COMPLETE
1 series · 13 of 25 positions shown · non-contrast
Comparison: None.

CLINICAL DATA: Initial evaluation for acute right testicular pain
for 3 days.

EXAM:
SCROTAL ULTRASOUND
DOPPLER ULTRASOUND OF THE TESTICLES
TECHNIQUE: Complete ultrasound examination of the testicles, epididymis, and
other scrotal structures was performed. Color and spectral Doppler
ultrasound were also utilized to evaluate blood flow to the
testicles.

[Series 1: ultrasound scrotum doppler complete · 13 of 83 slices shown]
[im 1/83]
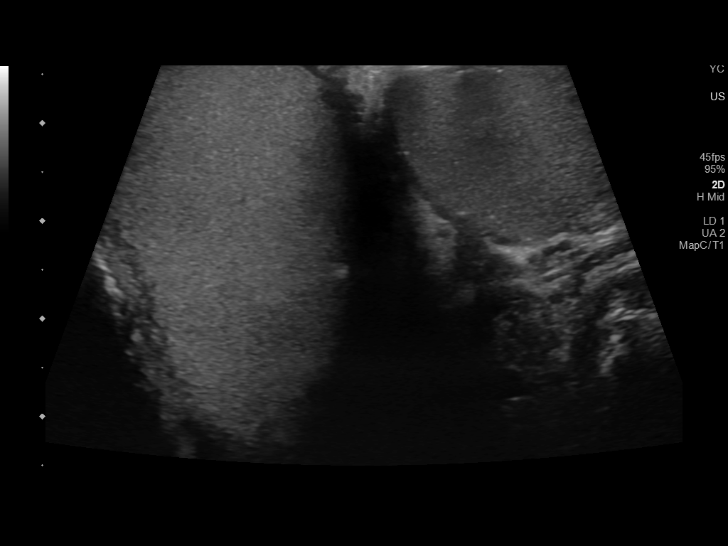
[im 7/83]
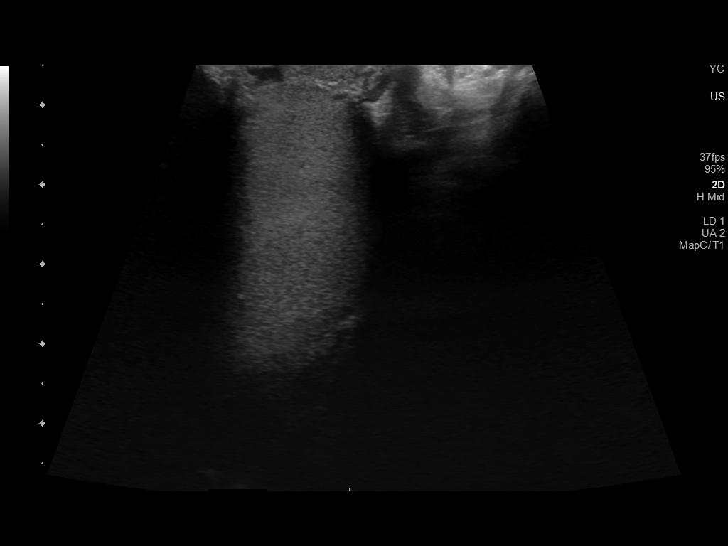
[im 14/83]
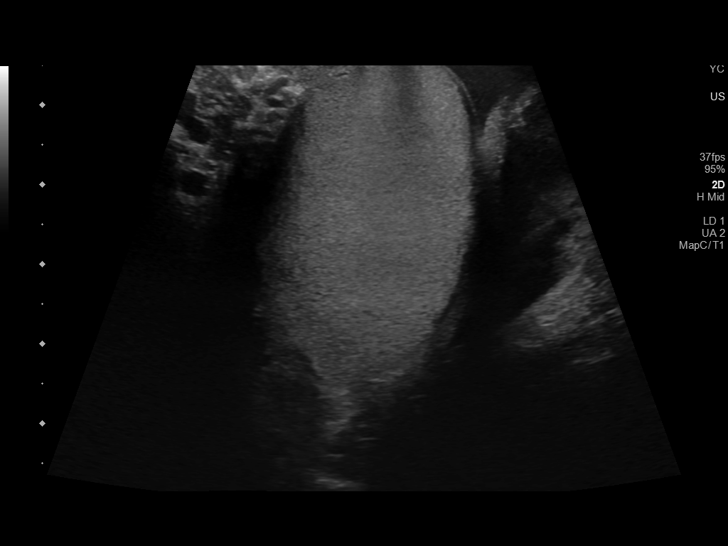
[im 21/83]
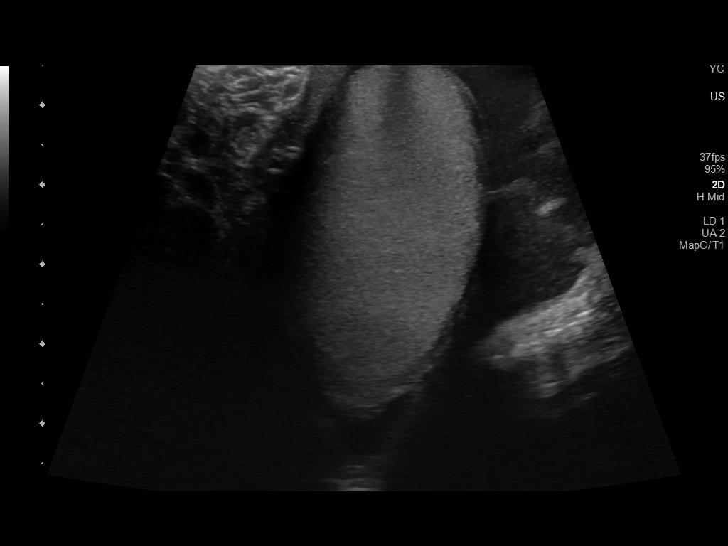
[im 28/83]
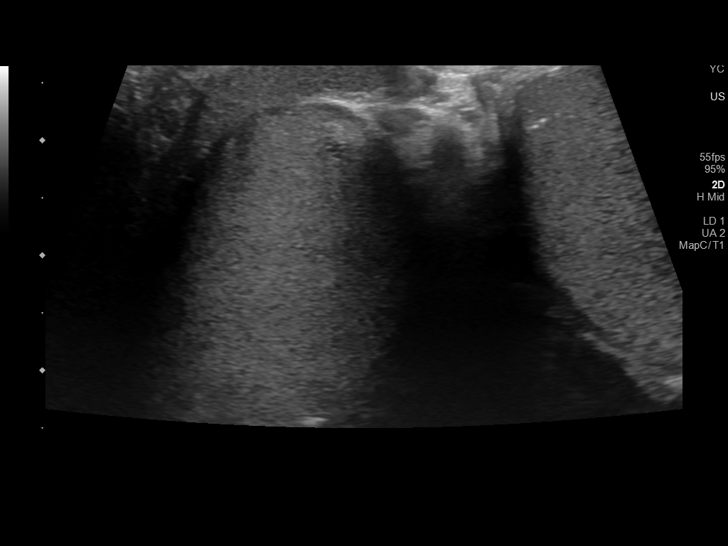
[im 35/83]
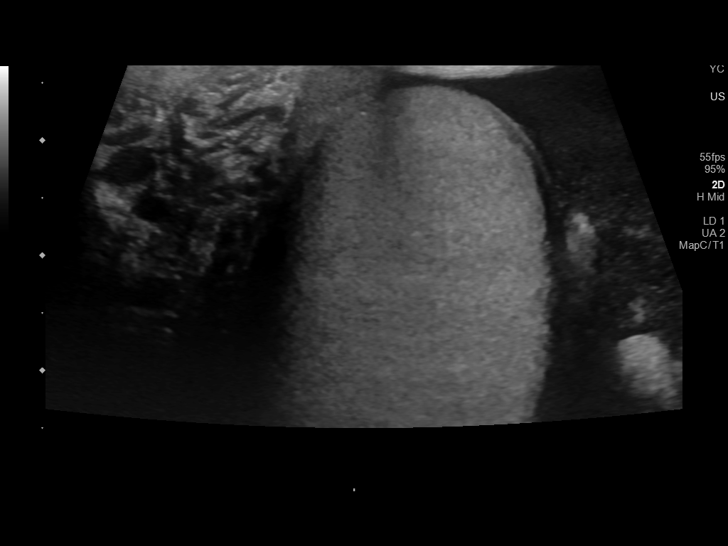
[im 42/83]
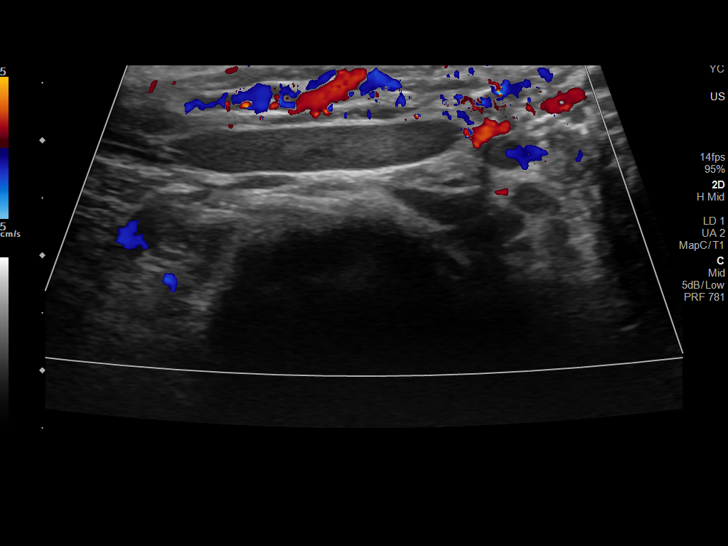
[im 48/83]
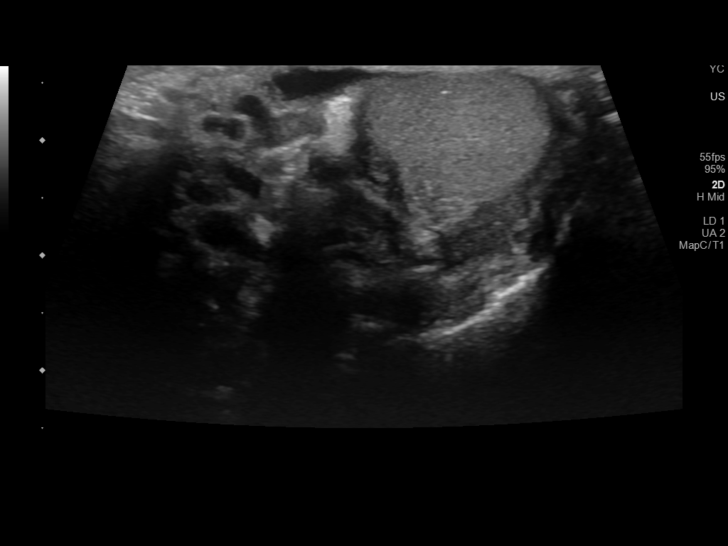
[im 55/83]
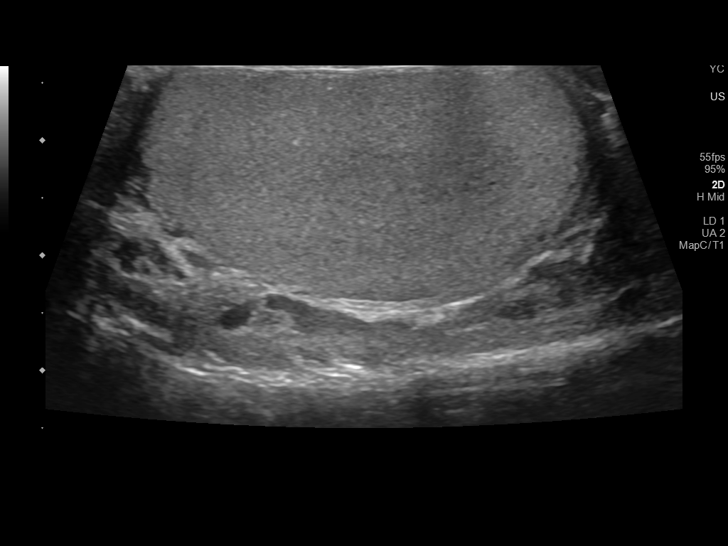
[im 62/83]
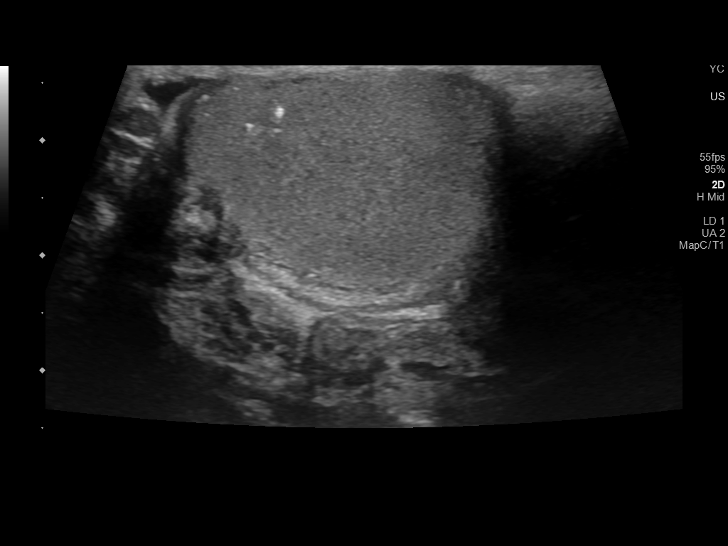
[im 69/83]
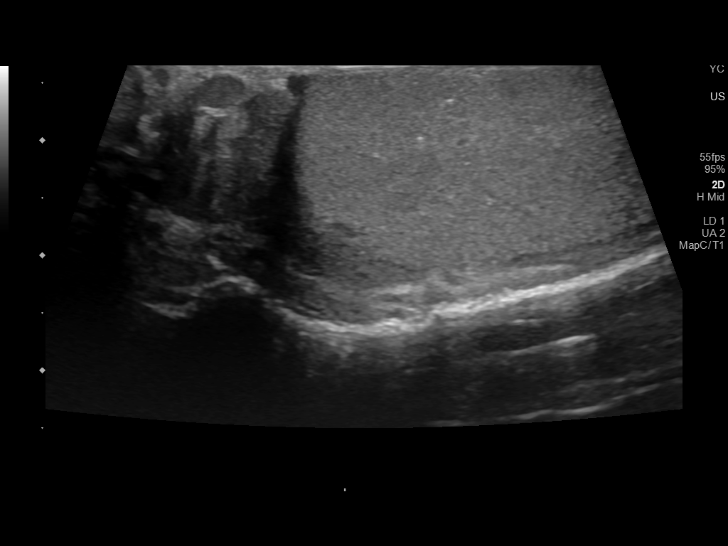
[im 76/83]
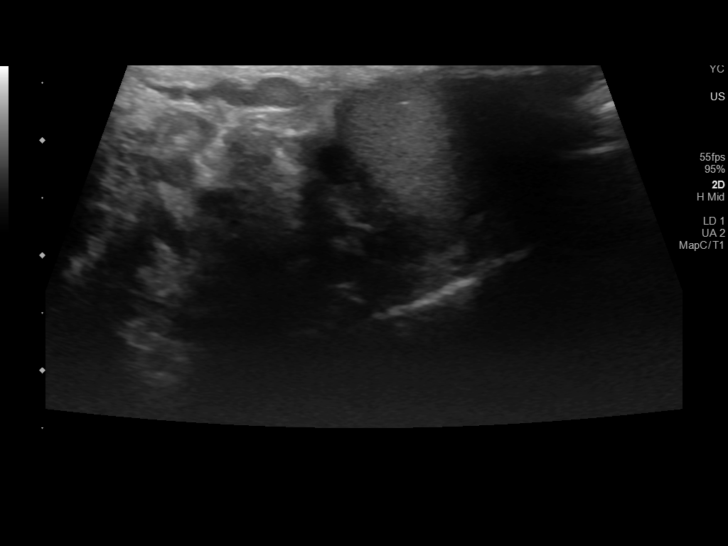
[im 83/83]
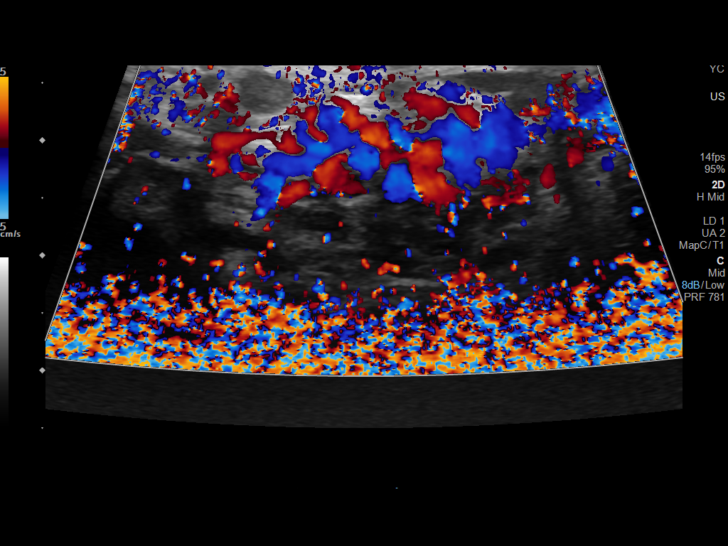

[13 of 25 positions shown; findings below may reference images not displayed]

FINDINGS: Right testicle

Measurements: 4.3 x 2.6 x 3.1 cm. No mass or microlithiasis
visualized.

Left testicle

Measurements: 3.9 x 2.0 x 2.9 cm. No mass lesion. Mild
microlithiasis.

Right epididymis:  Normal in size and appearance.

Left epididymis: Normal in size and appearance. 4 x 3 x 3 mm simple
cyst present at the left epididymal head, most consistent with a
benign epididymal cyst/spermatocele. Finding felt to be incidental
in nature and of doubtful clinical significance.

Hydrocele:  Moderate right-sided hydrocele.

Varicocele:  Small bilateral varicoceles noted.

Pulsed Doppler interrogation of both testes demonstrates normal low
resistance arterial and venous waveforms bilaterally.
IMPRESSION: 1. Moderate right-sided hydrocele.
2. Small bilateral varicoceles, which could contribute to underlying
testicular pain.
3. No other acute abnormality identified.  No evidence for torsion.
4. Mild left testicular microlithiasis. Current literature suggests
that testicular microlithiasis is not a significant independent risk
factor for development of testicular carcinoma, and that follow up
imaging is not warranted in the absence of other risk factors.
Monthly testicular self-examination and annual physical exams are
considered appropriate surveillance. If patient has other risk
factors for testicular carcinoma, then referral to Urology should be
considered. (Reference: Dhollar, et al.: A 5-Year Follow up Study
of Asymptomatic Men with Testicular Microlithiasis. J Urol 8333;
